# Patient Record
Sex: Female | Born: 1935 | Race: White | Hispanic: No | Smoking: Never smoker
Health system: Southern US, Community
[De-identification: ages and names within clinical notes are randomized; demographics above are authoritative.]

## PROBLEM LIST (undated history)

## (undated) DIAGNOSIS — I059 Rheumatic mitral valve disease, unspecified: Secondary | ICD-10-CM

## (undated) DIAGNOSIS — I10 Essential (primary) hypertension: Secondary | ICD-10-CM

## (undated) DIAGNOSIS — M8580 Other specified disorders of bone density and structure, unspecified site: Secondary | ICD-10-CM

## (undated) DIAGNOSIS — Z8489 Family history of other specified conditions: Secondary | ICD-10-CM

## (undated) DIAGNOSIS — T7840XA Allergy, unspecified, initial encounter: Secondary | ICD-10-CM

## (undated) DIAGNOSIS — C4490 Unspecified malignant neoplasm of skin, unspecified: Secondary | ICD-10-CM

## (undated) DIAGNOSIS — M7070 Other bursitis of hip, unspecified hip: Secondary | ICD-10-CM

## (undated) DIAGNOSIS — E785 Hyperlipidemia, unspecified: Secondary | ICD-10-CM

## (undated) HISTORY — DX: Other specified disorders of bone density and structure, unspecified site: M85.80

## (undated) HISTORY — DX: Allergy, unspecified, initial encounter: T78.40XA

## (undated) HISTORY — DX: Rheumatic mitral valve disease, unspecified: I05.9

## (undated) HISTORY — PX: DENTAL SURGERY: SHX609

## (undated) HISTORY — DX: Hyperlipidemia, unspecified: E78.5

## (undated) HISTORY — DX: Other bursitis of hip, unspecified hip: M70.70

## (undated) HISTORY — PX: TONSILLECTOMY: SUR1361

## (undated) HISTORY — DX: Essential (primary) hypertension: I10

## (undated) HISTORY — DX: Unspecified malignant neoplasm of skin, unspecified: C44.90

---

## 1969-06-23 HISTORY — PX: ABDOMINAL HYSTERECTOMY: SHX81

## 1997-11-21 HISTORY — PX: BREAST BIOPSY: SHX20

## 1997-11-23 ENCOUNTER — Ambulatory Visit (HOSPITAL_BASED_OUTPATIENT_CLINIC_OR_DEPARTMENT_OTHER): Admission: RE | Admit: 1997-11-23 | Discharge: 1997-11-23 | Payer: Self-pay | Admitting: General Surgery

## 1998-04-23 HISTORY — PX: ORIF RADIAL FRACTURE: SHX5113

## 2000-01-22 DIAGNOSIS — M8580 Other specified disorders of bone density and structure, unspecified site: Secondary | ICD-10-CM

## 2000-01-22 HISTORY — DX: Other specified disorders of bone density and structure, unspecified site: M85.80

## 2002-01-03 ENCOUNTER — Other Ambulatory Visit: Admission: RE | Admit: 2002-01-03 | Discharge: 2002-01-03 | Payer: Self-pay | Admitting: Family Medicine

## 2002-01-03 ENCOUNTER — Encounter: Payer: Self-pay | Admitting: Family Medicine

## 2002-01-03 LAB — CONVERTED CEMR LAB: Pap Smear: NORMAL

## 2002-01-14 LAB — FECAL OCCULT BLOOD, GUAIAC: Fecal Occult Blood: NEGATIVE

## 2003-11-29 ENCOUNTER — Encounter: Admission: RE | Admit: 2003-11-29 | Discharge: 2003-11-29 | Payer: Self-pay | Admitting: Family Medicine

## 2004-10-29 ENCOUNTER — Ambulatory Visit: Payer: Self-pay | Admitting: Family Medicine

## 2004-12-02 ENCOUNTER — Ambulatory Visit: Payer: Self-pay | Admitting: Family Medicine

## 2005-06-19 ENCOUNTER — Ambulatory Visit: Payer: Self-pay | Admitting: Family Medicine

## 2005-07-15 ENCOUNTER — Ambulatory Visit: Payer: Self-pay | Admitting: Family Medicine

## 2005-10-21 ENCOUNTER — Ambulatory Visit: Payer: Self-pay | Admitting: Family Medicine

## 2006-06-29 ENCOUNTER — Ambulatory Visit: Payer: Self-pay | Admitting: Family Medicine

## 2006-08-28 ENCOUNTER — Ambulatory Visit: Payer: Self-pay | Admitting: Family Medicine

## 2006-09-24 ENCOUNTER — Ambulatory Visit: Payer: Self-pay | Admitting: Family Medicine

## 2006-10-26 ENCOUNTER — Ambulatory Visit: Payer: Self-pay | Admitting: Family Medicine

## 2006-10-26 DIAGNOSIS — E7800 Pure hypercholesterolemia, unspecified: Secondary | ICD-10-CM

## 2006-10-26 DIAGNOSIS — I10 Essential (primary) hypertension: Secondary | ICD-10-CM | POA: Insufficient documentation

## 2006-10-26 HISTORY — DX: Essential (primary) hypertension: I10

## 2006-11-02 LAB — CONVERTED CEMR LAB
Albumin: 3.5 g/dL (ref 3.5–5.2)
BUN: 7 mg/dL (ref 6–23)
Chloride: 101 meq/L (ref 96–112)
Cholesterol: 220 mg/dL (ref 0–200)
Creatinine, Ser: 0.7 mg/dL (ref 0.4–1.2)
GFR calc Af Amer: 104 mL/min
HCT: 40.9 % (ref 36.0–46.0)
HDL: 49.5 mg/dL (ref >39.0)
Hemoglobin: 14.1 g/dL (ref 12.0–15.0)
Platelets: 197 10*3/uL (ref 150–400)
RBC: 4.37 M/uL (ref 3.87–5.11)
RDW: 12.9 % (ref 11.5–14.6)
Sodium: 139 meq/L (ref 135–145)
Triglycerides: 103 mg/dL (ref 0–149)

## 2006-11-30 ENCOUNTER — Ambulatory Visit: Payer: Self-pay | Admitting: Internal Medicine

## 2007-02-25 ENCOUNTER — Encounter: Payer: Self-pay | Admitting: Family Medicine

## 2007-02-25 DIAGNOSIS — I059 Rheumatic mitral valve disease, unspecified: Secondary | ICD-10-CM | POA: Insufficient documentation

## 2007-02-25 DIAGNOSIS — J309 Allergic rhinitis, unspecified: Secondary | ICD-10-CM | POA: Insufficient documentation

## 2007-02-25 DIAGNOSIS — I8310 Varicose veins of unspecified lower extremity with inflammation: Secondary | ICD-10-CM | POA: Insufficient documentation

## 2007-02-25 DIAGNOSIS — J45909 Unspecified asthma, uncomplicated: Secondary | ICD-10-CM | POA: Insufficient documentation

## 2007-02-25 DIAGNOSIS — M810 Age-related osteoporosis without current pathological fracture: Secondary | ICD-10-CM

## 2007-02-25 DIAGNOSIS — M5137 Other intervertebral disc degeneration, lumbosacral region: Secondary | ICD-10-CM

## 2007-02-25 DIAGNOSIS — C44300 Unspecified malignant neoplasm of skin of unspecified part of face: Secondary | ICD-10-CM | POA: Insufficient documentation

## 2007-02-25 DIAGNOSIS — E785 Hyperlipidemia, unspecified: Secondary | ICD-10-CM | POA: Insufficient documentation

## 2007-02-25 DIAGNOSIS — R002 Palpitations: Secondary | ICD-10-CM

## 2007-02-25 DIAGNOSIS — Z85820 Personal history of malignant melanoma of skin: Secondary | ICD-10-CM

## 2007-02-25 DIAGNOSIS — C44309 Unspecified malignant neoplasm of skin of other parts of face: Secondary | ICD-10-CM | POA: Insufficient documentation

## 2007-02-25 HISTORY — DX: Rheumatic mitral valve disease, unspecified: I05.9

## 2007-02-26 ENCOUNTER — Ambulatory Visit: Payer: Self-pay | Admitting: Family Medicine

## 2007-03-25 ENCOUNTER — Ambulatory Visit: Payer: Self-pay | Admitting: Family Medicine

## 2007-03-25 LAB — CONVERTED CEMR LAB
Bilirubin Urine: NEGATIVE
Nitrite: NEGATIVE
Protein, U semiquant: NEGATIVE
pH: 6.5

## 2007-09-08 ENCOUNTER — Ambulatory Visit: Payer: Self-pay | Admitting: Family Medicine

## 2007-09-23 ENCOUNTER — Ambulatory Visit: Payer: Self-pay | Admitting: Family Medicine

## 2007-09-23 DIAGNOSIS — H9190 Unspecified hearing loss, unspecified ear: Secondary | ICD-10-CM | POA: Insufficient documentation

## 2007-11-16 ENCOUNTER — Ambulatory Visit: Payer: Self-pay | Admitting: Internal Medicine

## 2007-11-16 LAB — CONVERTED CEMR LAB
Glucose, Urine, Semiquant: NEGATIVE
Ketones, urine, test strip: NEGATIVE
Nitrite: NEGATIVE
pH: 7

## 2007-12-06 ENCOUNTER — Encounter: Payer: Self-pay | Admitting: Family Medicine

## 2007-12-10 ENCOUNTER — Encounter (INDEPENDENT_AMBULATORY_CARE_PROVIDER_SITE_OTHER): Payer: Self-pay | Admitting: *Deleted

## 2008-04-20 ENCOUNTER — Encounter: Payer: Self-pay | Admitting: Family Medicine

## 2008-10-03 ENCOUNTER — Ambulatory Visit: Payer: Self-pay | Admitting: Family Medicine

## 2008-10-09 ENCOUNTER — Encounter (INDEPENDENT_AMBULATORY_CARE_PROVIDER_SITE_OTHER): Payer: Self-pay | Admitting: Internal Medicine

## 2008-10-09 ENCOUNTER — Ambulatory Visit: Payer: Self-pay | Admitting: Family Medicine

## 2008-10-16 LAB — CONVERTED CEMR LAB
BUN: 11 mg/dL (ref 6–23)
CO2: 32 meq/L (ref 19–32)
Calcium: 8.9 mg/dL (ref 8.4–10.5)
Chloride: 105 meq/L (ref 96–112)
Creatinine, Ser: 0.6 mg/dL (ref 0.4–1.2)
GFR calc non Af Amer: 102.25 mL/min (ref >60)
Glucose, Bld: 101 mg/dL — ABNORMAL HIGH (ref 70–99)
Potassium: 4.2 meq/L (ref 3.5–5.1)
Sodium: 143 meq/L (ref 135–145)
TSH: 0.85 microintl units/mL (ref 0.35–5.50)
Vit D, 25-Hydroxy: 61 ng/mL (ref 30–89)

## 2008-11-10 ENCOUNTER — Ambulatory Visit: Payer: Self-pay | Admitting: Family Medicine

## 2008-11-10 LAB — CONVERTED CEMR LAB: Cholesterol, target level: 200 mg/dL

## 2008-11-13 LAB — CONVERTED CEMR LAB
ALT: 19 units/L (ref 0–35)
Total CHOL/HDL Ratio: 4
VLDL: 36 mg/dL (ref 0.0–40.0)

## 2008-11-28 ENCOUNTER — Ambulatory Visit: Payer: Self-pay | Admitting: Family Medicine

## 2008-11-28 LAB — CONVERTED CEMR LAB
OCCULT 1: NEGATIVE
OCCULT 2: NEGATIVE
OCCULT 3: NEGATIVE

## 2008-11-29 ENCOUNTER — Encounter (INDEPENDENT_AMBULATORY_CARE_PROVIDER_SITE_OTHER): Payer: Self-pay | Admitting: *Deleted

## 2008-11-30 ENCOUNTER — Ambulatory Visit: Payer: Self-pay | Admitting: Family Medicine

## 2008-12-06 ENCOUNTER — Encounter: Payer: Self-pay | Admitting: Family Medicine

## 2008-12-11 ENCOUNTER — Encounter: Payer: Self-pay | Admitting: Family Medicine

## 2009-01-08 ENCOUNTER — Encounter: Payer: Self-pay | Admitting: Family Medicine

## 2009-01-08 ENCOUNTER — Ambulatory Visit: Payer: Self-pay | Admitting: Internal Medicine

## 2009-01-30 ENCOUNTER — Encounter (INDEPENDENT_AMBULATORY_CARE_PROVIDER_SITE_OTHER): Payer: Self-pay | Admitting: *Deleted

## 2009-02-16 ENCOUNTER — Ambulatory Visit: Payer: Self-pay | Admitting: Family Medicine

## 2009-02-16 LAB — CONVERTED CEMR LAB
Bilirubin Urine: NEGATIVE
Glucose, Urine, Semiquant: NEGATIVE
Ketones, urine, test strip: NEGATIVE
Nitrite: NEGATIVE
Protein, U semiquant: NEGATIVE
Urobilinogen, UA: 0.2
pH: 5.5

## 2009-02-17 ENCOUNTER — Encounter: Payer: Self-pay | Admitting: Family Medicine

## 2009-02-19 ENCOUNTER — Ambulatory Visit: Payer: Self-pay | Admitting: Family Medicine

## 2009-02-20 LAB — CONVERTED CEMR LAB
AST: 21 units/L (ref 0–37)
Direct LDL: 159.5 mg/dL

## 2009-03-01 ENCOUNTER — Ambulatory Visit: Payer: Self-pay | Admitting: Family Medicine

## 2009-04-12 ENCOUNTER — Ambulatory Visit: Payer: Self-pay | Admitting: Family Medicine

## 2009-04-16 LAB — CONVERTED CEMR LAB
AST: 23 units/L (ref 0–37)
Cholesterol: 173 mg/dL (ref 0–200)
LDL Cholesterol: 99 mg/dL (ref 0–99)

## 2009-06-19 ENCOUNTER — Ambulatory Visit: Payer: Self-pay | Admitting: Family Medicine

## 2009-06-19 LAB — CONVERTED CEMR LAB
Bacteria, UA: 0
Bilirubin Urine: NEGATIVE
Glucose, Urine, Semiquant: NEGATIVE
Ketones, urine, test strip: NEGATIVE
Nitrite: NEGATIVE
Urobilinogen, UA: 0.2

## 2009-06-23 HISTORY — PX: EYE SURGERY: SHX253

## 2009-10-11 ENCOUNTER — Ambulatory Visit: Payer: Self-pay | Admitting: Family Medicine

## 2009-10-16 ENCOUNTER — Ambulatory Visit: Payer: Self-pay | Admitting: Family Medicine

## 2009-10-17 LAB — CONVERTED CEMR LAB
AST: 21 units/L (ref 0–37)
Cholesterol: 153 mg/dL (ref 0–200)
HDL: 44.3 mg/dL (ref >39.00)
Total CHOL/HDL Ratio: 3

## 2009-10-23 ENCOUNTER — Encounter: Payer: Self-pay | Admitting: Family Medicine

## 2009-10-24 ENCOUNTER — Encounter (INDEPENDENT_AMBULATORY_CARE_PROVIDER_SITE_OTHER): Payer: Self-pay | Admitting: *Deleted

## 2010-05-08 ENCOUNTER — Ambulatory Visit: Payer: Self-pay | Admitting: Family Medicine

## 2010-05-09 LAB — CONVERTED CEMR LAB: Vit D, 25-Hydroxy: 69 ng/mL (ref 30–89)

## 2010-05-13 LAB — CONVERTED CEMR LAB
ALT: 20 units/L (ref 0–35)
AST: 25 units/L (ref 0–37)
Albumin: 3.8 g/dL (ref 3.5–5.2)
Glucose, Bld: 86 mg/dL (ref 70–99)
HCT: 39.9 % (ref 36.0–46.0)
HDL: 58.2 mg/dL (ref >39.00)
Hemoglobin: 13.6 g/dL (ref 12.0–15.0)
LDL Cholesterol: 100 mg/dL — ABNORMAL HIGH (ref 0–99)
Lymphocytes Relative: 20.2 % (ref 12.0–46.0)
MCHC: 34.2 g/dL (ref 30.0–36.0)
Monocytes Absolute: 0.5 10*3/uL (ref 0.1–1.0)
Monocytes Relative: 7.4 % (ref 3.0–12.0)
Potassium: 4.4 meq/L (ref 3.5–5.1)
RBC: 4.13 M/uL (ref 3.87–5.11)
RDW: 13.3 % (ref 11.5–14.6)
Sodium: 138 meq/L (ref 135–145)
Total Protein: 6.3 g/dL (ref 6.0–8.3)
WBC: 6.7 10*3/uL (ref 4.5–10.5)

## 2010-07-18 ENCOUNTER — Other Ambulatory Visit: Payer: Self-pay | Admitting: Dermatology

## 2010-07-25 NOTE — Miscellaneous (Signed)
  Clinical Lists Changes  Observations: Added new observation of MAMMO DUE: 10/24/2010 (10/23/2009 15:38) Added new observation of MAMMOGRAM: Normal (10/23/2009 15:38)

## 2010-07-25 NOTE — Assessment & Plan Note (Signed)
Summary: 6 MTH FU/CLE   Vital Signs:  Patient profile:   77 year old female Height:      64.25 inches Weight:      125.04 pounds BMI:     21.37 Temp:     98.1 degrees F oral Pulse rate:   76 / minute Pulse rhythm:   regular BP sitting:   120 / 80  (left arm) Cuff size:   regular  Vitals Entered By: Benny Lennert CMA Duncan Dull) (May 08, 2010 9:20 AM)  History of Present Illness: Chief complaint 6 month follow up for lipids and HTN and OP   has general aches and pains-- they do not get her down  she keeps moving , however   feeling good otherwise   did have some episodes of vertigo in the spring -- has to be still for several days and she is better   wt is up 2 lb with bmi of 21  120/80-- Htn in very good control  lipids very good in april with statin and diet    OP-- on actonel -- no problems with that  takes her ca and vit D dexa 7/10 lat vit D was 61  declines flu shots or pneumonia vaccine  is afraid of the flu   Contraindications/Deferment of Procedures/Staging:    Test/Procedure: FLU VAX    Reason for deferment: patient declined     Test/Procedure: Pneumovax vaccine    Reason for deferment: patient declined   Allergies: 1)  ! Penicillin 2)  ! Codeine 3)  ! Septra 4)  Penicillin 5)  Codeine 6)  Septra 7)  * Goody Powder 8)  * Evista 9)  Vioxx 10)  Atenolol 11)  * Hctz  Past History:  Past Surgical History: Last updated: 01/29/2009 Hysterectomy- partial, fibroids Fibrocystic breast biopsy (11/1997) Arm fracture- radial, no surgery (04/1998) Dexa- osteopenia (01/2000) Dexa- osteoporosis (02/2002),   stable (03/2004),  osteopenia, increased T score (11/2006) dexa slt imp osteopenia 7/10  Family History: Last updated: 02/25/2007 Father: CVA Mother: MI, DM Siblings: 1 sister, OA on foot  Social History: Last updated: 02/16/2009 Marital Status: Married Children: 4 Occupation: retired denies smoking Alcohol use-no Drug  use-no  Risk Factors: Smoking Status: never (02/25/2007)  Past Medical History: Allergic rhinitis Asthma Hyperlipidemia Osteoporosis high blood pressure bursitis hip    ortho - Dr Noel Gerold  derm- [facility]  Review of Systems General:  Denies fatigue, loss of appetite, and malaise. Eyes:  Denies blurring and eye irritation. CV:  Denies chest pain or discomfort, lightheadness, and palpitations. Resp:  Denies cough, shortness of breath, and wheezing. GI:  Denies abdominal pain and nausea. GU:  Denies urinary frequency. MS:  Denies joint pain, muscle aches, and cramps. Derm:  Denies itching, lesion(s), poor wound healing, and rash. Neuro:  Denies numbness and tingling. Psych:  Denies anxiety and depression. Endo:  Denies excessive thirst. Heme:  Denies abnormal bruising and bleeding.  Physical Exam  General:  slim elderly female Head:  normocephalic, atraumatic, and no abnormalities observed.   Eyes:  vision grossly intact, pupils equal, pupils round, and pupils reactive to light.  no conjunctival pallor, injection or icterus  Ears:  R ear normal and L ear normal.   Mouth:  pharynx pink and moist.   Neck:  supple with full rom and no masses or thyromegally, no JVD or carotid bruit  Lungs:  Normal respiratory effort, chest expands symmetrically. Lungs are clear to auscultation, no crackles or wheezes. Heart:  Normal rate and  regular rhythm. S1 and S2 normal without gallop, murmur, click, rub or other extra sounds. Abdomen:  soft and nt no renal bruits  Msk:  No deformity or scoliosis noted of thoracic or lumbar spine.  some OA changes poor rom hips Pulses:  R and L carotid,radial,femoral,dorsalis pedis and posterior tibial pulses are full and equal bilaterally Extremities:  No clubbing, cyanosis, edema, or deformity noted with normal full range of motion of all joints.   Neurologic:  sensation intact to light touch, gait normal, and DTRs symmetrical and normal.   Skin:  Intact  without suspicious lesions or rashes Cervical Nodes:  No lymphadenopathy noted Inguinal Nodes:  No significant adenopathy Psych:  normal affect, talkative and pleasant    Impression & Recommendations:  Problem # 1:  HYPERLIPIDEMIA (ICD-272.4) Assessment Unchanged  lab today has been well controlled on diet and statin  rev low sat fat diet  Her updated medication list for this problem includes:    Zocor 20 Mg Tabs (Simvastatin) .[name]... 1 by mouth once daily  Orders: Venipuncture ([address]) TLB-Lipid Panel (80061-LIPID) TLB-Renal Function Panel (80069-RENAL) TLB-CBC Platelet - w/Differential (85025-CBCD) TLB-Hepatic/Liver Function Pnl (80076-HEPATIC) TLB-TSH (Thyroid Stimulating Hormone) (84443-TSH) T-Vitamin D (25-Hydroxy) ([address]454-09811)  Labs Reviewed: SGOT: 21 (10/16/2009)   SGPT: 24 (10/16/2009)  Lipid Goals: Chol Goal: 200 (11/10/2008)   HDL Goal: 40 (11/10/2008)   LDL Goal: 130 (11/10/2008)   TG Goal: 150 (11/10/2008)  Prior 10 Yr Risk Heart Disease: Not enough information (11/10/2008)   HDL:44.30 (10/16/2009), 56.20 (04/12/2009)  LDL:88 (10/16/2009), 99 (04/12/2009)  Chol:153 (10/16/2009), 173 (04/12/2009)  Trig:105.0 (10/16/2009), 90.0 (04/12/2009)  Problem # 2:  OSTEOPOROSIS (ICD-733.00) Assessment: Unchanged  check D level  no problems with actonel rev ca and /d  not due for dexa untl summer Her updated medication list for this problem includes:    Actonel 35 Mg Tabs (Risedronate sodium) .[name]... Take 1 tablet by mouth once a week  Orders: Specimen Handling (91478)  Problem # 3:  BASAL CELL CARCINOMA, NOSE (ICD-173.3) Assessment: Comment Only hx of basal cell  needs f/u with derm for skin screen will make her own appt  Problem # 4:  HYPERTENSION, BENIGN (ICD-401.1) Assessment: Unchanged  good control with atenolol labs today refilled med f/u 6 mo Her updated medication list for this problem includes:    Atenolol 50 Mg Tabs (Atenolol) .[name]... Take 1/2 tablet  by mouth twice a day  Orders: Venipuncture ([address]) TLB-Lipid Panel (80061-LIPID) TLB-Renal Function Panel (80069-RENAL) TLB-CBC Platelet - w/Differential (85025-CBCD) TLB-Hepatic/Liver Function Pnl (80076-HEPATIC) TLB-TSH (Thyroid Stimulating Hormone) (84443-TSH) T-Vitamin D (25-Hydroxy) (13086-57846) Prescription Created Electronically [phone])  BP today: 120/80 Prior BP: 133/77 (10/11/2009)  Prior 10 Yr Risk Heart Disease: Not enough information (11/10/2008)  Labs Reviewed: K+: 4.2 (10/09/2008) Creat: : 0.6 (10/09/2008)   Chol: 153 (10/16/2009)   HDL: 44.30 (10/16/2009)   LDL: 88 (10/16/2009)   TG: 105.0 (10/16/2009)  Problem # 5:  ALLERGIC RHINITIS (ICD-477.9) Assessment: Unchanged has been controlled with claritin- no change Her updated medication list for this problem includes:    Claritin 10 Mg Tabs (Loratadine) .[name]... Take 1 tab daily for allergies.  Complete Medication List: 1)  Atenolol 50 Mg Tabs (Atenolol) .... Take 1/2 tablet by mouth twice a day 2)  Actonel 35 Mg Tabs (Risedronate sodium) .... Take 1 tablet by mouth once a week 3)  Omega-3 350 Mg Caps (Omega-3 fatty acids) .... Take 1 tablet by mouth once a day 4)  Bl Maximum One Daily Tabs (Multiple vitamins-minerals) .Marland KitchenMarland KitchenMarland Kitchen  Take 1 tablet by mouth once a day 5)  Lotrisone 1-0.05 % Lotn (Clotrimazole-betamethasone) .... Apply thinnly two times a day as needed irritation 6)  D 400 Caps (Vitamins a & d) .... Take 1 tab daily. 7)  Claritin 10 Mg Tabs (Loratadine) .... Take 1 tab daily for allergies. 8)  Triamcinolone Acetonide 0.1 % Crea (Triamcinolone acetonide) .... Apply to itchy rash two times a day, disp 2 week supply 9)  Aleve 220 Mg Tabs (Naproxen sodium) .[name].. 1-2 by mouth as needed bursitis pain 10)  Zocor 20 Mg Tabs (Simvastatin) .[name].. 1 by mouth once daily  Patient Instructions: 1)  keep as active as possible 2)  elevate feet when you do sit  3)  no change in medicine  4)  labs today  5)  follow up with me  in 6 months  Prescriptions: ATENOLOL 50 MG TABS (ATENOLOL) Take 1/2 tablet by mouth twice a day  #30 x 11   Entered and Authorized by:   Judith Part MD   Signed by:   Judith Part MD on 05/08/2010   Method used:   Electronically to        [facility] (retail)       [address]       [place], [place]  [address]       Ph: [phone]       Fax: [phone]   RxID:   [id number]    Orders Added: 1)  Venipuncture [84132] 2)  TLB-Lipid Panel [80061-LIPID] 3)  TLB-Renal Function Panel [80069-RENAL] 4)  TLB-CBC Platelet - w/Differential [85025-CBCD] 5)  TLB-Hepatic/Liver Function Pnl [80076-HEPATIC] 6)  TLB-TSH (Thyroid Stimulating Hormone) [84443-TSH] 7)  T-Vitamin D (25-Hydroxy) [44010-27253] 8)  Specimen Handling [99000] 9)  Est. Patient Level IV [66440] 10)  Prescription Created Electronically [H4742]    Current Allergies (reviewed today): ! PENICILLIN ! CODEINE ! SEPTRA PENICILLIN CODEINE SEPTRA * GOODY POWDER * EVISTA VIOXX ATENOLOL * HCTZ

## 2010-07-25 NOTE — Assessment & Plan Note (Signed)
Summary: Acute sinusitis   Vital Signs:  Patient Profile:   77 Years Old Female CC:      Cold & URI symptoms Height:     64.25 inches Weight:      123 pounds O2 Sat:      100 % O2 treatment:    Room Air Temp:     97.5 degrees F oral Pulse rate:   77 / minute Pulse rhythm:   regular Resp:     16 per minute BP sitting:   133 / 77  (right arm) Cuff size:   regular  Vitals Entered By: Areta Haber CMA (October 11, 2009 5:25 PM)                  Current Allergies: ! PENICILLIN ! CODEINE ! SEPTRA PENICILLIN CODEINE SEPTRA * GOODY POWDER * EVISTA VIOXX ATENOLOL * HCTZ    History of Present Illness Referral source: Dr. towers.  History from: patient Chief Complaint: Cold & URI symptoms History of Present Illness: 2 weeks  of ST and hoarseness.  Then started with a cough adn now getting sore over her nasal bridge.  Lots of head rpessure and HA. Feels like her head is gong to explode.  Not sure if any fever.  Feels her balance is of alittle.  Took some aleve for the HA.  Did feel achey all over.  Feel tired.  Decreased appetite.  No GI sxs.  No SOB.  Hx of high blood pressure.  Not  a smoker.    Current Problems: SINUSITIS, ACUTE (ICD-461.9) BURSITIS, RIGHT HIP (ICD-726.5) OTHER ABNORMAL FINDING RADIOLOGICAL EXAM BREAST (ICD-793.89) CELLULITIS (ICD-682.9) OTHER SCREENING MAMMOGRAM (ICD-V76.12) HEARING LOSS, BILATERAL (ICD-389.9) Hx of STASIS DERMATITIS (ICD-454.1) Hx of MELANOMA, LEG (ICD-172.7) Hx of MITRAL VALVE PROLAPSE (ICD-424.0) DEGENERATIVE DISC DISEASE, LUMBOSACRAL SPINE (ICD-722.52) BASAL CELL CARCINOMA, NOSE (ICD-173.3) PALPITATIONS (ICD-785.1) OSTEOPOROSIS (ICD-733.00) HYPERLIPIDEMIA (ICD-272.4) ASTHMA (ICD-493.90) ALLERGIC RHINITIS (ICD-477.9) HYPERTENSION, BENIGN (ICD-401.1) HYPERCHOLESTEROLEMIA (ICD-272.0)   Current Meds ATENOLOL 50 MG TABS (ATENOLOL) Take 1/2 tablet by mouth twice a day ACTONEL 35 MG TABS (RISEDRONATE SODIUM) Take 1 tablet  by mouth once a week OMEGA-3 350 MG  CAPS (OMEGA-3 FATTY ACIDS) Take 1 tablet by mouth once a day BL MAXIMUM ONE DAILY   TABS (MULTIPLE VITAMINS-MINERALS) Take 1 tablet by mouth once a day LOTRISONE 1-0.05 %  LOTN (CLOTRIMAZOLE-BETAMETHASONE) apply thinnly two times a day as needed irritation D 400   CAPS (VITAMINS A & D) Take 1 tab daily. CLARITIN 10 MG  TABS (LORATADINE) Take 1 tab daily for allergies. TRIAMCINOLONE ACETONIDE 0.1 % CREA (TRIAMCINOLONE ACETONIDE) Apply to itchy rash two times a day, disp 2 week supply ALEVE 220 MG TABS (NAPROXEN SODIUM) 1-2 by mouth as needed bursitis pain ZOCOR 20 MG TABS (SIMVASTATIN) 1 by mouth once daily DOXYCYCLINE HYCLATE 100 MG TABS (DOXYCYCLINE HYCLATE) Take 1 tablet by mouth two times a day for 10 days  REVIEW OF SYSTEMS Constitutional Symptoms      Denies fever, chills, night sweats, weight loss, weight gain, and fatigue.  Eyes       Denies change in vision, eye pain, eye discharge, glasses, contact lenses, and eye surgery. Ear/Nose/Throat/Mouth       Complains of sinus problems.      Denies hearing loss/aids, change in hearing, ear pain, ear discharge, dizziness, frequent runny nose, frequent nose bleeds, sore throat, hoarseness, and tooth pain or bleeding.      Comments: x 2wks Respiratory       Complains  of dry cough.      Denies productive cough, wheezing, shortness of breath, asthma, bronchitis, and emphysema/COPD.  Cardiovascular       Denies murmurs, chest pain, and tires easily with exhertion.    Gastrointestinal       Denies stomach pain, nausea/vomiting, diarrhea, constipation, blood in bowel movements, and indigestion. Genitourniary       Denies painful urination, kidney stones, and loss of urinary control. Neurological       Complains of headaches.      Denies paralysis, seizures, and fainting/blackouts. Musculoskeletal       Denies muscle pain, joint pain, joint stiffness, decreased range of motion, redness, swelling, muscle  weakness, and gout.  Skin       Denies bruising, unusual mles/lumps or sores, and hair/skin or nail changes.  Psych       Denies mood changes, temper/anger issues, anxiety/stress, speech problems, depression, and sleep problems. Other Comments: Pt has not seen PCP for this    Past History:  Past Medical History: Last updated: 03/01/2009 Allergic rhinitis Asthma Hyperlipidemia Osteoporosis high blood pressure bursitis hip    ortho - Dr Noel Gerold   Past Surgical History: Last updated: 01/29/2009 Hysterectomy- partial, fibroids Fibrocystic breast biopsy (11/1997) Arm fracture- radial, no surgery (04/1998) Dexa- osteopenia (01/2000) Dexa- osteoporosis (02/2002),   stable (03/2004),  osteopenia, increased T score (11/2006) dexa slt imp osteopenia 7/10  Family History: Last updated: 02/25/2007 Father: CVA Mother: MI, DM Siblings: 1 sister, OA on foot  Social History: Last updated: 02/16/2009 Marital Status: Married Children: 4 Occupation: retired denies smoking Alcohol use-no Drug use-no  Risk Factors: Smoking Status: never (02/25/2007) Physical Exam General appearance: well developed, well nourished, no acute distress Head: normocephalic, atraumatic Eyes: conjunctivae and lids normal, watering bilat Pupils: equal, round, reactive to light Ears: normal, no lesions or deformities Nasal: mucosa pink, nonedematous, no septal deviation, turbinates normal Oral/Pharynx: tongue normal, posterior pharynx without erythema or exudate Neck: neck supple,  trachea midline, no masses Thyroid: no nodules, masses, tenderness, or enlargement Chest/Lungs: no rales, wheezes, or rhonchi bilateral, breath sounds equal without effort Heart: RRR, with 2/6 SEM  Skin: no obvious rashes or lesions Assessment New Problems: SINUSITIS, ACUTE (ICD-461.9)  Discussee  Patient Education: Patient and/or caregiver instructed in the following: rest, fluids, Tylenol prn.  Plan New  Medications/Changes: DOXYCYCLINE HYCLATE 100 MG TABS (DOXYCYCLINE HYCLATE) Take 1 tablet by mouth two times a day for 10 days  #20 x 0, 10/11/2009, Nani Gasser MD DOXYCYCLINE HYCLATE 100 MG TABS (DOXYCYCLINE HYCLATE) Take 1 tablet by mouth two times a day for 10 days  #20 x 0, 10/11/2009, Nani Gasser MD  New Orders: Est. Patient Level III [[address]] Follow Up: Follow up in 2-3 days if no improvement  The patient and/or caregiver has been counseled thoroughly with regard to medications prescribed including dosage, schedule, interactions, rationale for use, and possible side effects and they verbalize understanding.  Diagnoses and expected course of recovery discussed and will return if not improved as expected or if the condition worsens. Patient and/or caregiver verbalized understanding.  Prescriptions: DOXYCYCLINE HYCLATE 100 MG TABS (DOXYCYCLINE HYCLATE) Take 1 tablet by mouth two times a day for 10 days  #20 x 0   Entered and Authorized by:   Nani Gasser MD   Signed by:   Nani Gasser MD on 10/11/2009   Method used:   Electronically to        [facility] [id number][facility] (retail)  [address]       [place], [place]  [address]       Ph: [phone]       Fax: [phone]   RxID:   [id number] DOXYCYCLINE HYCLATE 100 MG TABS (DOXYCYCLINE HYCLATE) Take 1 tablet by mouth two times a day for 10 days  #20 x 0   Entered and Authorized by:   Nani Gasser MD   Signed by:   Nani Gasser MD on 10/11/2009   Method used:   Electronically to        [facility] [id number][facility] (retail)       [address]       [place], [place]  [address]       Ph: [phone]       Fax: [phone]   RxID:   [id number]   Patient Instructions: 1)  [facility]  Make sure to take to all of it. 2)  Make sure to stay hydrated and use Tylenol for a headache.  3)  Followup with your regular MD if not better in 5 days.   Complete Medication  List: 1)  Atenolol 50 Mg Tabs (Atenolol) .... Take 1/2 tablet by mouth twice a day 2)  Actonel 35 Mg Tabs (Risedronate sodium) .... Take 1 tablet by mouth once a week 3)  Omega-3 350 Mg Caps (Omega-3 fatty acids) .... Take 1 tablet by mouth once a day 4)  Bl Maximum One Daily Tabs (Multiple vitamins-minerals) .... Take 1 tablet by mouth once a day 5)  Lotrisone 1-0.05 % Lotn (Clotrimazole-betamethasone) .... Apply thinnly two times a day as needed irritation 6)  D 400 Caps (Vitamins a & d) .... Take 1 tab daily. 7)  Claritin 10 Mg Tabs (Loratadine) .... Take 1 tab daily for allergies. 8)  Triamcinolone Acetonide 0.1 % Crea (Triamcinolone acetonide) .... Apply to itchy rash two times a day, disp 2 week supply 9)  Aleve 220 Mg Tabs (Naproxen sodium) .[name].. 1-2 by mouth as needed bursitis pain 10)  Zocor 20 Mg Tabs (Simvastatin) .[name].. 1 by mouth once daily 11)  Doxycycline Hyclate 100 Mg Tabs (Doxycycline hyclate) .... Take 1 tablet by mouth two times a day for 10 days

## 2010-07-25 NOTE — Letter (Signed)
Summary: Results Follow up Letter  [facility]  [address] [place], [place] [address]   Phone: [phone]  Fax: [phone]    10/24/2009 MRN: [id number]    [facility] [address] [place], [place]  [address]    Dear Ms. Procell,  The following are the results of your recent test(s):  Test         Result    Pap Smear:        Normal _____  Not Normal _____ Comments: ______________________________________________________ Cholesterol: LDL(Bad cholesterol):         Your goal is less than:         HDL (Good cholesterol):       Your goal is more than: Comments:  ______________________________________________________ Mammogram:        Normal ___X__  Not Normal _____ Comments:  Yearly follow up is recommended.   ___________________________________________________________________ Hemoccult:        Normal _____  Not normal _______ Comments:    _____________________________________________________________________ Other Tests:    We routinely do not discuss normal results over the telephone.  If you desire a copy of the results, or you have any questions about this information we can discuss them at your next office visit.   Sincerely,    Marne A. Milinda Antis, M.D.  MAT:lsf

## 2010-08-23 ENCOUNTER — Ambulatory Visit (INDEPENDENT_AMBULATORY_CARE_PROVIDER_SITE_OTHER): Payer: Medicare HMO | Admitting: Family Medicine

## 2010-08-23 ENCOUNTER — Encounter: Payer: Self-pay | Admitting: Family Medicine

## 2010-08-23 DIAGNOSIS — N390 Urinary tract infection, site not specified: Secondary | ICD-10-CM | POA: Insufficient documentation

## 2010-08-27 ENCOUNTER — Telehealth: Payer: Self-pay | Admitting: Family Medicine

## 2010-08-29 NOTE — Assessment & Plan Note (Signed)
Summary: ?UTI/CLE  SEEN @ [facility],[place] 08/10/10   Vital Signs:  Patient profile:   77 year old female Height:      64.25 inches Weight:      128.25 pounds BMI:     21.92 Temp:     98 degrees F oral Pulse rate:   84 / minute Pulse rhythm:   regular BP sitting:   150 / 70  (left arm) Cuff size:   regular  Vitals Entered By: Delilah Shan CMA Ayisha Pol Dull) (August 23, 2010 11:49 AM) CC: ? UTI   History of Present Illness: Prev with blood in urine and some change in urine color.  Had some mild burning with urination at the time.   [facility]ent to [facility] 08/10/10.  Dx'd with UTI and started on macrobid.  She had h/o recurrent UTI.   She finised the antibiotics with 10days of tx. Symptoms were better until today.  Today with pressure during urination.  No FCNAV.    We reviewed her allergies in detail and she had GI upset with cipro but no true allergy to this medicine.  Allergies: 1)  ! Penicillin 2)  ! Codeine 3)  ! Septra 4)  Penicillin 5)  Codeine 6)  Septra 7)  * Goody Powder 8)  * Evista 9)  Vioxx 10)  Atenolol 11)  * Hctz  Social History: Marital Status: Widowed Children: 4 Occupation: retired denies smoking Alcohol use-no Drug use-no  Review of Systems       See HPI.  Otherwise negative.    Physical Exam  General:  GEN: nad, alert and oriented HEENT: mucous membranes moist NECK: supple CV: rrr.  PULM: ctab, no inc wob ABD: soft, +bs, suprapubic area mildly tender EXT: no edema SKIN: no acute rash BACK: no CVA pain    Impression & Recommendations:  Problem # 1:  UTI (ICD-599.0) Okay to take short course of cipro.  check cx and follow up as needed.  she agrees.  Nontoxic.   Her updated medication list for this problem includes:    Ciprofloxacin Hcl 250 Mg Tabs (Ciprofloxacin hcl) .[name]... 1 by mouth two times a day  Orders: Prescription Created Electronically [phone]) Specimen Handling ([address]) T-Culture, Urine ([id number])  Complete Medication List: 1)   Atenolol 50 Mg Tabs (Atenolol) .... Take 1/2 tablet by mouth twice a day 2)  Actonel 35 Mg Tabs (Risedronate sodium) .... Take 1 tablet by mouth once a week 3)  Omega-3 350 Mg Caps (Omega-3 fatty acids) .... Take 1 tablet by mouth once a day 4)  Bl Maximum One Daily Tabs (Multiple vitamins-minerals) .... Take 1 tablet by mouth once a day 5)  Lotrisone 1-0.05 % Lotn (Clotrimazole-betamethasone) .... Apply thinnly two times a day as needed irritation 6)  D 400 Caps (Vitamins a & d) .... Take 1 tab daily. 7)  Claritin 10 Mg Tabs (Loratadine) .... Take 1 tab daily for allergies. 8)  Triamcinolone Acetonide 0.1 % Crea (Triamcinolone acetonide) .... Apply to itchy rash two times a day, disp 2 week supply 9)  Aleve 220 Mg Tabs (Naproxen sodium) .[name].. 1-2 by mouth as needed bursitis pain 10)  Zocor 20 Mg Tabs (Simvastatin) .[name].. 1 by mouth once daily 11)  Ciprofloxacin Hcl 250 Mg Tabs (Ciprofloxacin hcl) .[name].. 1 by mouth two times a day  Patient Instructions: 1)  Drink plenty of fluids. Cranberry juice is especially recommended in addition to large amounts of water. Avoid caffeine & carbonated drinks, they tend to irritate the bladder,  Return in 3-5 days if you're not better: sooner if you're feeling worse.  2)  Start the cipro today.  We'll contact you with your lab report.  Prescriptions: CIPROFLOXACIN HCL 250 MG TABS (CIPROFLOXACIN HCL) 1 by mouth two times a day  #6 x 0   Entered and Authorized by:   Crawford Givens MD   Signed by:   Crawford Givens MD on 08/23/2010   Method used:   Electronically to        [facility]* (retail)       [address]       [place], [place]  [address]       Ph: [phone]       Fax: [phone]   RxID:   [id number]    Orders Added: 1)  Prescription Created Electronically [[id number]] 2)  Est. Patient Level III [[address]] 3)  Specimen Handling [99000] 4)  T-Culture, Urine [[id number]]    Current Allergies (reviewed today): ! PENICILLIN ! CODEINE !  SEPTRA PENICILLIN CODEINE SEPTRA * GOODY POWDER * EVISTA VIOXX ATENOLOL * HCTZ  Appended Document: ?UTI/CLE  SEEN @ [facility],[place] ON 08/10/10  Laboratory Results   Urine Tests  Date/Time Received: August 23, 2010 4:57 PM   Routine Urinalysis   Color: yellow Appearance: Cloudy Glucose: negative   (Normal Range: Negative) Bilirubin: negative   (Normal Range: Negative) Ketone: negative   (Normal Range: Negative) Spec. Gravity: 1.020   (Normal Range: 1.003-1.035) Blood: large   (Normal Range: Negative) pH: 6.0   (Normal Range: 5.0-8.0) Protein: trace   (Normal Range: Negative) Urobilinogen: 0.2   (Normal Range: 0-1) Nitrite: positive   (Normal Range: Negative) Leukocyte Esterace: moderate   (Normal Range: Negative)

## 2010-09-02 ENCOUNTER — Encounter: Payer: Self-pay | Admitting: Family Medicine

## 2010-09-02 ENCOUNTER — Ambulatory Visit (INDEPENDENT_AMBULATORY_CARE_PROVIDER_SITE_OTHER): Payer: Medicare HMO | Admitting: Family Medicine

## 2010-09-02 DIAGNOSIS — I10 Essential (primary) hypertension: Secondary | ICD-10-CM

## 2010-09-02 DIAGNOSIS — R4589 Other symptoms and signs involving emotional state: Secondary | ICD-10-CM

## 2010-09-02 DIAGNOSIS — N390 Urinary tract infection, site not specified: Secondary | ICD-10-CM

## 2010-09-02 LAB — CONVERTED CEMR LAB
Bilirubin Urine: NEGATIVE
Nitrite: NEGATIVE
Specific Gravity, Urine: 1.01
Urobilinogen, UA: 0.2
WBC Urine, dipstick: NEGATIVE

## 2010-09-03 NOTE — Progress Notes (Signed)
  Phone Note Call from Patient Call back at [phone]   Caller: Patient Call For: Dr.Gleen Ripberger Summary of Call: Please have Urine Culture results phoned to pt's daughter's home.  She's staying w/ her.  Phone [phone] Initial call taken by: Beau Fanny,  August 27, 2010 9:05 AM  Follow-up for Phone Call        see ucx report. Crawford Givens MD  August 27, 2010 11:07 AM   Done.  Lugene Fuquay CMA (AAMA)  August 27, 2010 11:20 AM

## 2010-09-10 NOTE — Assessment & Plan Note (Signed)
Summary: urgent care follow up for uti/ [facility]   Vital Signs:  Patient profile:   77 year old female Height:      64.25 inches Weight:      128.25 pounds BMI:     21.92 Temp:     98.1 degrees F oral Pulse rate:   76 / minute Pulse rhythm:   regular BP sitting:   140 / 78  (left arm) Cuff size:   regular  Vitals Entered By: Lewanda Rife LPN (September 02, 2010 10:39 AM) CC: f/u UTI seen at [facility]   History of Present Illness: here for f/u of recurrent uti was pretty miserable for a while   main symptom was heaviness over bladder and frequency  that is much better  was [facility] - and urine was red at that time- it frightened her    ua is normal today recently had ecoli uti pan sensitive and tx twice with macrobid and then cipro (which worked better) urine was really yellow from her vitamin this am   drinking lot of water   tougue does not want to stay still- this is annoying/ like a tremor  ? from stress- a lot happening with family and self     Allergies: 1)  ! Penicillin 2)  ! Codeine 3)  ! Septra 4)  Penicillin 5)  Codeine 6)  Septra 7)  * Goody Powder 8)  * Evista 9)  Vioxx 10)  Atenolol 11)  * Hctz  Past History:  Past Medical History: Last updated: 05/08/2010 Allergic rhinitis Asthma Hyperlipidemia Osteoporosis high blood pressure bursitis hip    ortho - Dr Noel Gerold  derm- [facility]  Past Surgical History: Last updated: 01/29/2009 Hysterectomy- partial, fibroids Fibrocystic breast biopsy (11/1997) Arm fracture- radial, no surgery (04/1998) Dexa- osteopenia (01/2000) Dexa- osteoporosis (02/2002),   stable (03/2004),  osteopenia, increased T score (11/2006) dexa slt imp osteopenia 7/10  Family History: Last updated: 09/02/2010 Father: CVA Mother: MI, DM Siblings: 1 sister, OA on foot sister leukemia   Social History: Last updated: 09/02/2010 Marital Status: Widowed Children: 4 Occupation: retired denies smoking Alcohol  use-no Drug use-no caring for daughter with leg injury caring for sister with leukemia   Risk Factors: Smoking Status: never (02/25/2007)  Family History: Father: CVA Mother: MI, DM Siblings: 1 sister, OA on foot sister leukemia   Social History: Marital Status: Widowed Children: 4 Occupation: retired denies smoking Alcohol use-no Drug use-no caring for daughter with leg injury caring for sister with leukemia   Review of Systems General:  Denies chills, fatigue, fever, and loss of appetite. Eyes:  Denies blurring. ENT:  Denies difficulty swallowing, nosebleeds, sinus pressure, and sore throat. CV:  Denies chest pain or discomfort and palpitations. Resp:  Denies cough, shortness of breath, and wheezing. GI:  Denies abdominal pain, bloody stools, change in bowel habits, indigestion, nausea, and vomiting. GU:  Denies dysuria, hematuria, urinary frequency, and urinary hesitancy. MS:  Denies mid back pain. Derm:  Denies itching, lesion(s), poor wound healing, and rash. Neuro:  Denies numbness and tingling. Psych:  Denies anxiety and depression. Endo:  Denies excessive thirst and excessive urination. Heme:  Denies abnormal bruising and bleeding.  Physical Exam  General:  Well-developed,well-nourished,in no acute distress; alert,appropriate and cooperative throughout examination Head:  normocephalic, atraumatic, and no abnormalities observed.   Eyes:  vision grossly intact, pupils equal, pupils round, and pupils reactive to light.  no conjunctival pallor, injection or icterus  Nose:  no nasal  discharge.   Mouth:  pharynx pink and moist.   Neck:  supple with full rom and no masses or thyromegally, no JVD or carotid bruit  Chest Wall:  No deformities, masses, or tenderness noted. Lungs:  Normal respiratory effort, chest expands symmetrically. Lungs are clear to auscultation, no crackles or wheezes. Heart:  Normal rate and regular rhythm. S1 and S2 normal without gallop,  murmur, click, rub or other extra sounds. Abdomen:  no suprapubic tenderness or fullness felt  Extremities:  No clubbing, cyanosis, edema, or deformity noted with normal full range of motion of all joints.   Neurologic:  sensation intact to light touch, gait normal, and DTRs symmetrical and normal.  no tremors noted - toungue or otherwise  Skin:  Intact without suspicious lesions or rashes Cervical Nodes:  No lymphadenopathy noted Psych:  normal affect, talkative and pleasant    Impression & Recommendations:  Problem # 1:  UTI (ICD-599.0) Assessment Improved this appears to have cleared with nl ua and reslution of symptoms  continue to watch keep up good fluids  The following medications were removed from the medication list:    Ciprofloxacin Hcl 250 Mg Tabs (Ciprofloxacin hcl) .[name]... 1 by mouth two times a day  Orders: UA Dipstick w/o Micro (manual) (04540)  Problem # 2:  HYPERTENSION, BENIGN (ICD-401.1) Assessment: Deteriorated  bp up a bit with anx and stress will continue to follow  f/u 6 mo  Her updated medication list for this problem includes:    Atenolol 50 Mg Tabs (Atenolol) .[name]... Take 1 tablet by mouth once a day at night  BP today: 140/78 Prior BP: 150/70 (08/23/2010)  Prior 10 Yr Risk Heart Disease: Not enough information (11/10/2008)  Labs Reviewed: K+: 4.4 (05/08/2010) Creat: : 0.7 (05/08/2010)   Chol: 183 (05/08/2010)   HDL: 58.20 (05/08/2010)   LDL: 100 (05/08/2010)   TG: 122.0 (05/08/2010)  Problem # 3:  STRESS REACTION, ACUTE, WITH EMOTIONAL DISTURBANCE (ICD-308.0) Assessment: New with much psychologial stress -- and exp a tounge tremor  no meds to expl this  suspect anx  pt has good coping tech will watch this  did offer counseling if she needs it   Complete Medication List: 1)  Atenolol 50 Mg Tabs (Atenolol) .... Take 1 tablet by mouth once a day at night 2)  Actonel 35 Mg Tabs (Risedronate sodium) .... Take 1 tablet by mouth once a week 3)   Omega-3 350 Mg Caps (Omega-3 fatty acids) .... Take 1 tablet by mouth once a day 4)  Bl Maximum One Daily Tabs (Multiple vitamins-minerals) .... Take 1 tablet by mouth once a day 5)  Lotrisone 1-0.05 % Lotn (Clotrimazole-betamethasone) .... Apply thinnly two times a day as needed irritation 6)  D 400 Caps (Vitamins a & d) .... Take 1 tab daily. 7)  Claritin 10 Mg Tabs (Loratadine) .... Take 1 tab daily for allergies. 8)  Triamcinolone Acetonide 0.1 % Crea (Triamcinolone acetonide) .... Apply to itchy rash two times a day, disp 2 week supply as needed 9)  Aleve 220 Mg Tabs (Naproxen sodium) .[name].. 1-2 by mouth as needed bursitis pain 10)  Zocor 20 Mg Tabs (Simvastatin) .[name].. 1 by mouth once daily  Patient Instructions: 1)  urine is clear today  2)  continue good water intake  3)  try to stay active - go for a walk when you can  4)  this will help anxiety 5)  let me know if your toungue tremor worsens or does not improve  6)  avoid aleve for 5 days prior from getting teeth pulled  7)  follow up with me in about 6 months    Orders Added: 1)  UA Dipstick w/o Micro (manual) [81002] 2)  Est. Patient Level IV [13086]    Current Allergies (reviewed today): ! PENICILLIN ! CODEINE ! SEPTRA PENICILLIN CODEINE SEPTRA * GOODY POWDER * EVISTA VIOXX ATENOLOL * HCTZ  Laboratory Results   Urine Tests  Date/Time Received: September 02, 2010 10:41 AM  Date/Time Reported: September 02, 2010 10:41 AM   Routine Urinalysis   Color: yellow Appearance: Clear Glucose: negative   (Normal Range: Negative) Bilirubin: negative   (Normal Range: Negative) Ketone: negative   (Normal Range: Negative) Spec. Gravity: 1.010   (Normal Range: 1.003-1.035) Blood: trace-lysed   (Normal Range: Negative) pH: 6.0   (Normal Range: 5.0-8.0) Protein: negative   (Normal Range: Negative) Urobilinogen: 0.2   (Normal Range: 0-1) Nitrite: negative   (Normal Range: Negative) Leukocyte Esterace: negative   (Normal Range:  Negative)

## 2010-09-30 ENCOUNTER — Other Ambulatory Visit: Payer: Self-pay | Admitting: Family Medicine

## 2010-10-14 ENCOUNTER — Other Ambulatory Visit: Payer: Self-pay | Admitting: *Deleted

## 2010-10-14 MED ORDER — RISEDRONATE SODIUM 35 MG PO TABS: 35.0000 mg | ORAL_TABLET | ORAL | Status: DC

## 2010-10-25 LAB — HM MAMMOGRAPHY: HM Mammogram: NEGATIVE

## 2010-11-25 ENCOUNTER — Encounter: Payer: Self-pay | Admitting: Family Medicine

## 2010-11-25 ENCOUNTER — Ambulatory Visit (INDEPENDENT_AMBULATORY_CARE_PROVIDER_SITE_OTHER): Payer: Medicare HMO | Admitting: Family Medicine

## 2010-11-25 VITALS — BP 112/74 | HR 72 | Wt 126.2 lb

## 2010-11-25 DIAGNOSIS — R319 Hematuria, unspecified: Secondary | ICD-10-CM

## 2010-11-25 DIAGNOSIS — N390 Urinary tract infection, site not specified: Secondary | ICD-10-CM

## 2010-11-25 LAB — POCT URINALYSIS DIPSTICK
Protein, UA: NEGATIVE
Urobilinogen, UA: 0.2
pH, UA: 6

## 2010-11-25 MED ORDER — CIPROFLOXACIN HCL 250 MG PO TABS: 250.0000 mg | ORAL_TABLET | Freq: Two times a day (BID) | ORAL | Status: AC

## 2010-11-25 NOTE — Progress Notes (Signed)
  Subjective:    Patient ID: Paula Jimenez, female    DOB: 07-08-1926, 76 y.o.   MRN: [id number]  Urinary Tract Infection  This is a new problem. The current episode started yesterday. The problem occurs every urination. The problem has been gradually worsening. The quality of the pain is described as burning. The pain is mild. There has been no fever. She is not sexually active. There is no history of pyelonephritis. Associated symptoms include frequency and hematuria. Pertinent negatives include no chills, nausea, possible pregnancy, urgency or vomiting. Associated symptoms comments: Has seen occ blood clots and pink urine yesterday, none at most recent urinaction Does takes goody headache powder for headache yesterday, no aspirin. She has tried nothing for the symptoms. Her past medical history is significant for recurrent UTIs. There is no history of kidney stones or a urological procedure.    Last treated for pansensitive Ecoli by Dr. Para March in 08/2010 with cipro. Symptoms resolved completely. 2 UTI, no including today since last December  Review of Systems  Constitutional: Negative for chills.  Gastrointestinal: Negative for nausea and vomiting.  Genitourinary: Positive for frequency and hematuria. Negative for urgency.       Objective:   Physical Exam        Assessment & Plan:

## 2010-11-25 NOTE — Assessment & Plan Note (Signed)
Treat with 7 days of ciprofloxacin given elderly pt with recurrent UTI. Will verify sensitivity with urine culture. I have recommended follow up to assure blood in urine resolved after treatment. i have also recommended referral to urologist if she has another UTI in next few months.

## 2010-11-25 NOTE — Assessment & Plan Note (Signed)
Return to assure resolution of hematuria on UA in 2 weeks.

## 2010-11-25 NOTE — Patient Instructions (Addendum)
Make follow up appt in 2 weeks for nurse visit for urinalysis to make sure blood in urine resolved. Start antibitoics. Push fluids to stay hydrated. We will call you with urine culture results. Hold aleve or aspirin containing products until returning for urine re-check.

## 2010-11-27 LAB — URINE CULTURE

## 2010-11-28 ENCOUNTER — Telehealth: Payer: Self-pay

## 2010-11-28 NOTE — Telephone Encounter (Signed)
Message copied by Patience Musca on Thu Nov 28, 2010  2:56 PM ------      Message from: Ermalene Searing, [place] E      Created: Wed Nov 27, 2010  4:42 PM       Notify pt that her urine culture shows Ecoli again and it is sensitive to the antibiotic she is on. AMke sure to keep appt for nurse visit in 2 weeks to assure blood in urine resolved.

## 2010-11-28 NOTE — Telephone Encounter (Signed)
Patient notified as instructed by telephone. 

## 2010-12-10 ENCOUNTER — Ambulatory Visit (INDEPENDENT_AMBULATORY_CARE_PROVIDER_SITE_OTHER): Payer: Medicare HMO | Admitting: Family Medicine

## 2010-12-10 DIAGNOSIS — R8290 Unspecified abnormal findings in urine: Secondary | ICD-10-CM

## 2010-12-10 DIAGNOSIS — R82998 Other abnormal findings in urine: Secondary | ICD-10-CM

## 2010-12-10 LAB — POCT URINALYSIS DIPSTICK
Bilirubin, UA: NEGATIVE
Glucose, UA: NEGATIVE
Ketones, UA: NEGATIVE
Protein, UA: NEGATIVE

## 2010-12-14 NOTE — Progress Notes (Signed)
  Subjective:    Patient ID: Paula Jimenez, female    DOB: Apr 23, 1927, 77 y.o.   MRN: [id number]  HPI Here for ua   Review of Systems     Objective:   Physical Exam        Assessment & Plan:

## 2011-01-08 ENCOUNTER — Ambulatory Visit (INDEPENDENT_AMBULATORY_CARE_PROVIDER_SITE_OTHER): Payer: Medicare HMO | Admitting: Family Medicine

## 2011-01-08 ENCOUNTER — Encounter: Payer: Self-pay | Admitting: Family Medicine

## 2011-01-08 DIAGNOSIS — R319 Hematuria, unspecified: Secondary | ICD-10-CM

## 2011-01-08 DIAGNOSIS — R300 Dysuria: Secondary | ICD-10-CM

## 2011-01-08 DIAGNOSIS — R310 Gross hematuria: Secondary | ICD-10-CM

## 2011-01-08 LAB — POCT URINALYSIS DIPSTICK
Bilirubin, UA: NEGATIVE
Blood, UA: NEGATIVE
Glucose, UA: NEGATIVE
Protein, UA: NEGATIVE
Spec Grav, UA: 1.005
Urobilinogen, UA: 0.2
pH, UA: 6

## 2011-01-08 NOTE — Progress Notes (Signed)
Paula Jimenez, a 77 y.o. female presents today in the office for the following:    Pleasant patient presents with gross hematuria, dysuria. Some twinge with termination of urination. Not uncomfortable now, but clot earlier in the week from urethra. No back or CVAT. No flank pain. No fever or chills  [phone] Daughter's number Paula Jimenez Call here during the day  The PMH, PSH, Social History, Family History, Medications, and allergies have been reviewed in [facility], and have been updated if relevant.  ROS: GEN: No acute illnesses, no fevers, chills. GI: No n/v/d, eating normally Pulm: No SOB Interactive and getting along well at home.  Otherwise, ROS is as per the HPI.   Physical Exam  Blood pressure 138/64, pulse 68, temperature 98.1 F (36.7 C), temperature source Oral, weight 125 lb (56.7 kg).  GEN: WDWN, NAD, Non-toxic, A & O x 3 HEENT: Atraumatic, Normocephalic. Neck supple. No masses, No LAD. Ears and Nose: No external deformity. CV: RRR, No M/G/R. No JVD. No thrill. No extra heart sounds. PULM: CTA B, no wheezes, crackles, rhonchi. No retractions. No resp. distress. No accessory muscle use. ABD: S, NT, ND, +BS. No rebound tenderness. No HSM.  EXTR: No c/c/e NEURO Normal gait.  PSYCH: Normally interactive. Conversant. Not depressed or anxious appearing.  Calm demeanor.   Assessment and Plan: 1.  Dysuria and hematuria: UA neg. Check cx. If +, ABX. If neg, will need further urological eval given hematuria.

## 2011-01-08 NOTE — Progress Notes (Signed)
Addended by: Alvina Chou on: 01/08/2011 02:39 PM   Modules accepted: Orders

## 2011-01-10 ENCOUNTER — Telehealth: Payer: Self-pay | Admitting: Family Medicine

## 2011-01-10 LAB — URINE CULTURE

## 2011-01-10 MED ORDER — CIPROFLOXACIN HCL 500 MG PO TABS: 500.0000 mg | ORAL_TABLET | Freq: Two times a day (BID) | ORAL | Status: AC

## 2011-01-10 NOTE — Telephone Encounter (Signed)
Notify pt urine culture positive for Ecoli.. Need to start antibiotics. Sensitivities not back yet.  Last urine culture was pansensitive. Will start Cipro BID x 7 days.

## 2011-01-10 NOTE — Telephone Encounter (Signed)
Left message for patient since Friday afternoon also advised patient to call me back with any questions she has

## 2011-04-01 ENCOUNTER — Encounter: Payer: Self-pay | Admitting: Family Medicine

## 2011-04-02 ENCOUNTER — Encounter: Payer: Self-pay | Admitting: Family Medicine

## 2011-04-02 ENCOUNTER — Ambulatory Visit (INDEPENDENT_AMBULATORY_CARE_PROVIDER_SITE_OTHER): Payer: Medicare HMO | Admitting: Family Medicine

## 2011-04-02 VITALS — BP 146/80 | HR 68 | Temp 98.0°F | Ht 64.25 in | Wt 124.2 lb

## 2011-04-02 DIAGNOSIS — I10 Essential (primary) hypertension: Secondary | ICD-10-CM

## 2011-04-02 DIAGNOSIS — E785 Hyperlipidemia, unspecified: Secondary | ICD-10-CM

## 2011-04-02 LAB — CBC WITH DIFFERENTIAL/PLATELET
Eosinophils Absolute: 0.1 10*3/uL (ref 0.0–0.7)
HCT: 40.7 % (ref 36.0–46.0)
Lymphs Abs: 1.4 10*3/uL (ref 0.7–4.0)
MCHC: 33.2 g/dL (ref 30.0–36.0)
MCV: 95.5 fl (ref 78.0–100.0)
Monocytes Absolute: 0.5 10*3/uL (ref 0.1–1.0)
Neutrophils Relative %: 68.4 % (ref 43.0–77.0)
Platelets: 194 10*3/uL (ref 150.0–400.0)

## 2011-04-02 LAB — LIPID PANEL
Cholesterol: 163 mg/dL (ref 0–200)
HDL: 59.4 mg/dL (ref >39.00)
LDL Cholesterol: 84 mg/dL (ref 0–99)
VLDL: 19.8 mg/dL (ref 0.0–40.0)

## 2011-04-02 LAB — COMPREHENSIVE METABOLIC PANEL
AST: 20 U/L (ref 0–37)
Albumin: 4 g/dL (ref 3.5–5.2)
Alkaline Phosphatase: 58 U/L (ref 39–117)
BUN: 10 mg/dL (ref 6–23)
Glucose, Bld: 97 mg/dL (ref 70–99)
Potassium: 4 mEq/L (ref 3.5–5.1)
Sodium: 139 mEq/L (ref 135–145)
Total Bilirubin: 0.4 mg/dL (ref 0.3–1.2)

## 2011-04-02 LAB — TSH: TSH: 0.75 u[IU]/mL (ref 0.35–5.50)

## 2011-04-02 MED ORDER — ATENOLOL 50 MG PO TABS: 50.0000 mg | ORAL_TABLET | Freq: Every day | ORAL | Status: DC

## 2011-04-02 MED ORDER — SIMVASTATIN 20 MG PO TABS: 20.0000 mg | ORAL_TABLET | Freq: Every day | ORAL | Status: DC

## 2011-04-02 MED ORDER — RISEDRONATE SODIUM 35 MG PO TABS: 35.0000 mg | ORAL_TABLET | ORAL | Status: DC

## 2011-04-02 NOTE — Patient Instructions (Signed)
Call if you change your mind about flu shot  Labs today No change in your medicine  Stay active Schedule 30 min annual exam in 6 months with lab prior

## 2011-04-02 NOTE — Progress Notes (Signed)
Subjective:    Patient ID: Paula Jimenez, female    DOB: 1927/05/12, 77 y.o.   MRN: [id number]  HPI Here for f/u of HTN and hyperlipidemia - also due for labs Is feeling well overall  Nothing new medically overall   Had her bottom teeth pulled in June with gum and bone surgery- relived to be done with that    Some stress over her daughter - family issues , health issues - fibromyalgia and RA    Was here over the summer for utis and hematuria Has not had another uti- is totally better now  Did not end up having to see a urologist for that   HTN  146/80 today No cp or palpitations , but has hx of chronic headaches (years ) -- but not migraine  Does well with her medicines   Wt is stable with bmi of 21  On zocor for lipids Lab Results  Component Value Date   CHOL 183 05/08/2010   HDL 58.20 05/08/2010   LDLCALC 100* 05/08/2010   LDLDIRECT 159.5 02/19/2009   TRIG 122.0 05/08/2010   CHOLHDL 3 05/08/2010   does watch her diet Stays away from sat fats/ fried foods/ red meat and is overall quite active    Flu shot - she declines - even with information - does not believe it will not make her sick Disc this for quite a while and I could not change her mind  Patient Active Problem List  Diagnoses  . MELANOMA, LEG  . BASAL CELL CARCINOMA, NOSE  . HYPERCHOLESTEROLEMIA  . HYPERLIPIDEMIA  . HEARING LOSS, BILATERAL  . HYPERTENSION, BENIGN  . MITRAL VALVE PROLAPSE  . STASIS DERMATITIS  . ALLERGIC RHINITIS  . ASTHMA  . DEGENERATIVE DISC DISEASE, LUMBOSACRAL SPINE  . OSTEOPOROSIS  . PALPITATIONS  . BASAL CELL CARCINOMA, NOSE  . UTI  . STRESS REACTION, ACUTE, WITH EMOTIONAL DISTURBANCE  . Hematuria   Past Medical History  Diagnosis Date  . Allergy   . Asthma   . Hyperlipidemia   . Osteoporosis   . Hypertension   . Bursitis, hip   . Osteopenia 08/01   Past Surgical History  Procedure Date  . Abdominal hysterectomy   . Breast biopsy 06/99    fibrocystic  . Orif  radial fracture 11/99    arm fracture, radial no surgery   History  Substance Use Topics  . Smoking status: Never Smoker   . Smokeless tobacco: Not on file  . Alcohol Use: No   Family History  Problem Relation Age of Onset  . Leukemia Sister   . Cancer Sister     leukemia  . Heart disease Mother   . Diabetes Mother   . Stroke Father    Allergies  Allergen Reactions  . Atenolol     REACTION: can't tolerate whole pill  . Codeine   . Penicillins     REACTION: mouth swelling  . Raloxifene     REACTION: rash  . Rofecoxib   . Sulfamethoxazole W/Trimethoprim     REACTION: unknown   Current Outpatient Prescriptions on File Prior to Visit  Medication Sig Dispense Refill  . fish oil-omega-3 fatty acids 1000 MG capsule Take 1 g by mouth daily.        [name] loratadine (CLARITIN) 10 MG tablet Take 10 mg by mouth daily.        . Multiple Vitamins-Minerals (MULTIVITAMIN WITH MINERALS) tablet Take 1 tablet by mouth daily.        [name]  naproxen sodium (ANAPROX) 220 MG tablet Take 220 mg by mouth 2 (two) times daily with a meal.             Review of Systems Review of Systems  Constitutional: Negative for fever, appetite change, fatigue and unexpected weight change.  Eyes: Negative for pain and visual disturbance.  Respiratory: Negative for cough and shortness of breath.   Cardiovascular: Negative for cp or palpitations    Gastrointestinal: Negative for nausea, diarrhea and constipation.  Genitourinary: Negative for urgency and frequency.  Skin: Negative for pallor or rash   Neurological: Negative for weakness, light-headedness, numbness and headaches.  Hematological: Negative for adenopathy. Does not bruise/bleed easily.  Psychiatric/Behavioral: Negative for dysphoric mood. The patient is not nervous/anxious.          Objective:   Physical Exam  Constitutional: She appears well-developed and well-nourished. No distress.  HENT:  Head: Normocephalic and atraumatic.  Mouth/Throat:  Oropharynx is clear and moist.  Eyes: Conjunctivae and EOM are normal. Pupils are equal, round, and reactive to light.  Neck: Normal range of motion. Neck supple. No JVD present. Carotid bruit is not present. No thyromegaly present.  Cardiovascular: Normal rate, regular rhythm, normal heart sounds and intact distal pulses.   Pulmonary/Chest: Breath sounds normal. No respiratory distress. She has no wheezes.  Abdominal: Soft. Bowel sounds are normal. She exhibits no distension, no abdominal bruit and no mass. There is no tenderness.  Musculoskeletal: Normal range of motion. She exhibits no edema and no tenderness.  Lymphadenopathy:    She has no cervical adenopathy.  Neurological: She is alert. She has normal reflexes. No cranial nerve deficit. Coordination normal.  Skin: Skin is warm and dry. No rash noted. No erythema. No pallor.  Psychiatric: She has a normal mood and affect.          Assessment & Plan:

## 2011-04-03 NOTE — Assessment & Plan Note (Addendum)
Fairly controlled with diet and zocor - but could be better  Rev lab with pt Rev goals with pt Rev low sat fat diet also - overall does fairly well  No changes  Lab today

## 2011-04-03 NOTE — Assessment & Plan Note (Addendum)
In fair control with current meds- and outside of the office better readings  No change in meds - tenormin only - no side eff Enc her to stay as active as she can and avoid sodium Could not enc her to get a flu shot  Lab today

## 2011-07-02 ENCOUNTER — Encounter: Payer: Self-pay | Admitting: Family Medicine

## 2011-07-02 ENCOUNTER — Ambulatory Visit (INDEPENDENT_AMBULATORY_CARE_PROVIDER_SITE_OTHER): Payer: Medicare HMO | Admitting: Family Medicine

## 2011-07-02 VITALS — BP 148/80 | HR 80 | Temp 98.1°F | Ht 64.25 in | Wt 122.0 lb

## 2011-07-02 DIAGNOSIS — R21 Rash and other nonspecific skin eruption: Secondary | ICD-10-CM | POA: Insufficient documentation

## 2011-07-02 DIAGNOSIS — R197 Diarrhea, unspecified: Secondary | ICD-10-CM

## 2011-07-02 NOTE — Progress Notes (Signed)
Subjective:    Patient ID: Paula Jimenez, female    DOB: 11-06-1926, 78 y.o.   MRN: [id number]  HPI Got up with diarrhea 1 am on dec 30th -- was quite severe with watery stool and a little cramping  Lot of gas  No blood in her stool  No n/v  After that - every time she eats - she has an urgent bm  No recent antibiotics  Has not visited anyone in a hospital  Last thing she ate-- ? Unsure (did eat hot dog and fries for lunch that day)  She did take an immodium pill times 2 the first night   Never had a colonoscopy   Has a rash on both legs - all the sudden it just came up  Started December 31st -- wondered about immodium  Broke out into spots  Had this briefly as a kid - thought it was an allergy  Rash is still there  Not veryi itchy  Wears supp hose   Patient Active Problem List  Diagnoses  . MELANOMA, LEG  . BASAL CELL CARCINOMA, NOSE  . HYPERLIPIDEMIA  . HEARING LOSS, BILATERAL  . HYPERTENSION, BENIGN  . MITRAL VALVE PROLAPSE  . STASIS DERMATITIS  . ALLERGIC RHINITIS  . ASTHMA  . DEGENERATIVE DISC DISEASE, LUMBOSACRAL SPINE  . OSTEOPOROSIS  . PALPITATIONS  . BASAL CELL CARCINOMA, NOSE  . STRESS REACTION, ACUTE, WITH EMOTIONAL DISTURBANCE  . Diarrhea  . Rash   Past Medical History  Diagnosis Date  . Allergy   . Asthma   . Hyperlipidemia   . Osteoporosis   . Hypertension   . Bursitis, hip   . Osteopenia 08/01   Past Surgical History  Procedure Date  . Abdominal hysterectomy   . Breast biopsy 06/99    fibrocystic  . Orif radial fracture 11/99    arm fracture, radial no surgery   History  Substance Use Topics  . Smoking status: Never Smoker   . Smokeless tobacco: Not on file  . Alcohol Use: No   Family History  Problem Relation Age of Onset  . Leukemia Sister   . Cancer Sister     leukemia  . Heart disease Mother   . Diabetes Mother   . Stroke Father    Allergies  Allergen Reactions  . Atenolol     REACTION: can't tolerate whole pill  .  Codeine   . Penicillins     REACTION: mouth swelling  . Raloxifene     REACTION: rash  . Rofecoxib   . Sulfamethoxazole W/Trimethoprim     REACTION: unknown   Current Outpatient Prescriptions on File Prior to Visit  Medication Sig Dispense Refill  . atenolol (TENORMIN) 50 MG tablet Take 1 tablet (50 mg total) by mouth daily.  30 tablet  11  . risedronate (ACTONEL) 35 MG tablet Take 1 tablet (35 mg total) by mouth every 7 (seven) days. with water on empty stomach, nothing by mouth or lie down for next 30 minutes.  4 tablet  11  . simvastatin (ZOCOR) 20 MG tablet Take 1 tablet (20 mg total) by mouth at bedtime.  30 tablet  11  . acetaminophen (TYLENOL) 500 MG tablet Take 500 mg by mouth every 6 (six) hours as needed.        . fish oil-omega-3 fatty acids 1000 MG capsule Take 1 g by mouth daily.        [name] loratadine (CLARITIN) 10 MG tablet Take 10 mg by mouth  daily.        . Multiple Vitamins-Minerals (MULTIVITAMIN WITH MINERALS) tablet Take 1 tablet by mouth daily.        . naproxen sodium (ANAPROX) 220 MG tablet Take 220 mg by mouth 2 (two) times daily with a meal.             Review of Systems Review of Systems  Constitutional: Negative for fever, appetite change, fatigue and unexpected weight change.  Eyes: Negative for pain and visual disturbance.  Respiratory: Negative for cough and shortness of breath.   Cardiovascular: Negative for cp or palpitations    Gastrointestinal: Negative for nausea,  and constipation. neg for blood in stool or abd pain  Genitourinary: Negative for urgency and frequency.  Skin: Negative for pallor  Neurological: Negative for weakness, light-headedness, numbness and headaches.  Hematological: Negative for adenopathy. Does not bruise/bleed easily.  Psychiatric/Behavioral: Negative for dysphoric mood. The patient is not nervous/anxious.          Objective:   Physical Exam  Constitutional: She appears well-developed and well-nourished. No distress.        Elderly, well appearing   HENT:  Head: Normocephalic and atraumatic.  Mouth/Throat: Oropharynx is clear and moist.  Eyes: Conjunctivae and EOM are normal. Pupils are equal, round, and reactive to light. No scleral icterus.  Neck: Normal range of motion. Neck supple. No JVD present. Carotid bruit is not present. No thyromegaly present.  Cardiovascular: Normal rate, regular rhythm and normal heart sounds.        Pulses are difficult to palpate   Abdominal: Normal appearance and bowel sounds are normal. She exhibits no distension, no abdominal bruit and no mass. There is no hepatosplenomegaly. There is no tenderness. There is no CVA tenderness.  Musculoskeletal: Normal range of motion. She exhibits no edema and no tenderness.  Lymphadenopathy:    She has no cervical adenopathy.  Neurological: She is alert. She has normal reflexes. No cranial nerve deficit. She exhibits normal muscle tone. Coordination normal.  Skin: Skin is warm and dry. Rash noted. No pallor.       Lower legs have small erythematous macules that are non blanching and not raised or tender Upper legs - more blotchy redness- larger areas  No excoriation or skin breakdown  Psychiatric: She has a normal mood and affect.          Assessment & Plan:

## 2011-07-02 NOTE — Patient Instructions (Signed)
Stick with a bland diet  Start align over the counter (a probiotic) as directed Also start fiber supplement daily like metamucil or citrucel  If worse- let me know  Keep a good fluid intake  Labs today Stool tests today  I suspect you have had a virus and your colon needs to get bacterial balance back to normal  Avoid perfumes/ scented products for the rash / use a free laundry detergent and no fabric softener sheets

## 2011-07-02 NOTE — Assessment & Plan Note (Signed)
On legs looks almost like purpura - will check cbc today  Not too symptomatic  Is wearing her supp hose

## 2011-07-02 NOTE — Assessment & Plan Note (Signed)
Recurrent urgent loose stools after a 1-2 day bout of some sort of gastroentertitis No n/v or pain  Reassuring exam Perhaps bacterial imbalance Will start align otc and daily fiber Lab and stool studies ordered Never had colonosc -wants to avoid Update if not starting to improve in a week or if worsening

## 2011-07-03 LAB — CBC WITH DIFFERENTIAL/PLATELET
Basophils Relative: 0.4 % (ref 0.0–3.0)
Eosinophils Relative: 1.2 % (ref 0.0–5.0)
Lymphs Abs: 1.6 10*3/uL (ref 0.7–4.0)
MCHC: 34.1 g/dL (ref 30.0–36.0)
MCV: 94 fl (ref 78.0–100.0)
Monocytes Relative: 5.3 % (ref 3.0–12.0)
Neutro Abs: 5.6 10*3/uL (ref 1.4–7.7)
Neutrophils Relative %: 72.3 % (ref 43.0–77.0)
Platelets: 276 10*3/uL (ref 150.0–400.0)
RBC: 4.24 Mil/uL (ref 3.87–5.11)
RDW: 13 % (ref 11.5–14.6)

## 2011-07-03 LAB — BASIC METABOLIC PANEL
CO2: 29 mEq/L (ref 19–32)
Chloride: 100 mEq/L (ref 96–112)
Potassium: 4.2 mEq/L (ref 3.5–5.1)
Sodium: 137 mEq/L (ref 135–145)

## 2011-07-04 LAB — CLOSTRIDIUM DIFFICILE EIA: CDIFTX: NEGATIVE

## 2011-07-08 ENCOUNTER — Telehealth: Payer: Self-pay | Admitting: Internal Medicine

## 2011-07-08 MED ORDER — AZITHROMYCIN 500 MG PO TABS: 500.0000 mg | ORAL_TABLET | Freq: Every day | ORAL | Status: AC

## 2011-07-08 NOTE — Telephone Encounter (Signed)
Please let pt know her stool came back pos for bacteria called campylobacter -this can be food or water bourne  I want to treat her with zithromax 500 mg one daily for 3 days Then f/u with me in about 2 weeks for re check  It may take a bit to get better  I'm unsure if this is the whole cause of the diarrhea - but will start by treating it Px written for call in

## 2011-07-08 NOTE — Telephone Encounter (Signed)
Left vm for pt to callback 

## 2011-07-08 NOTE — Telephone Encounter (Signed)
Paula Jimenez from labs states her stool culture is positive for camp lebacter species.

## 2011-07-09 NOTE — Telephone Encounter (Signed)
Patient notified as instructed by telephone. Medication phoned to [facility] pharmacy as instructed.appt scheduled for f/u 07/23/11 at 10am.

## 2011-07-10 ENCOUNTER — Telehealth: Payer: Self-pay | Admitting: Family Medicine

## 2011-07-10 NOTE — Telephone Encounter (Signed)
Triage Record Num: [id number] Operator: Claudie Leach Patient Name: Paula Jimenez Call Date & Time: 07/09/2011 5:01:52PM Patient Phone: [phone] PCP: Audrie Gallus. Tower Patient Gender: Female PCP Fax : Patient DOB: 1926-10-16 Practice Name: [facility] Reason for Call: Caller: Faye/Other; PCP: Roxy Manns A.; CB#: [phone]; Call regarding Questions about medication. Was called in Rx today for Azithromycin 500 mg for intestinal infection. Has Rash splotchy rash to inside of both legs. Has reddish rash to inner legs that started on 07/08/11. No itching. Thinks it may be reaction to Atenolol. Afebrile. Caller states Rash was looked at on 07/02/11 and no orders were given concerning this. Caller wants to know if patient should continue Align and Metamucil with the Azithromycin. Unable to open EPIC and notified Dr. Artist Pais regarding caller's question and he stated to continue Align and Metamucil with the Azithromycin. Caller made aware. Emergent sx of Rash r/o. Caller instructed to call office back in the morning and Care advice given. Protocol(s) Used: Rash Recommended Outcome per Protocol: See Provider within 24 hours Reason for Outcome: Generalized rash AND not responding to 8 hours of home care Care Advice: ~ Another adult should drive. ~ Call provider if symptoms worsen or new symptoms develop. ~ Wear loose-fitting, non-restricting clothing. ~ SYMPTOM / CONDITION MANAGEMENT ~ CAUTIONS ~ List, or take, all current prescription(s), nonprescription or alternative medication(s) to provider for evaluation. Call EMS 911 if develop signs and symptoms of anaphylaxis within minutes to several hours of exposure: severe difficulty breathing; rapid, weak or irregular pulse; pruritus, urticaria, swelling of face, lips, tongue, or throat causing tightness or difficulty swallowing; abdominal cramping, nausea, vomiting or diarrhea. ~ 07/09/2011 5:39:33PM Page 1 of 1 [id number]

## 2011-07-10 NOTE — Telephone Encounter (Signed)
I'm hesitant to substitute something else right away until we know if this is the cause of a rash - perhaps she could check bp at home or a pharmacy and report back to me? Generally being off med for a short time is not a problem

## 2011-07-10 NOTE — Telephone Encounter (Signed)
Lucendia Herrlich notified as instructed by telephone. Since Lucendia Herrlich has gotten home rash is better so Lucendia Herrlich wants to cut Atenolol in 1/2  For tonight and will call back update.

## 2011-07-10 NOTE — Telephone Encounter (Signed)
Tell pt to go ahead and hold the atenolol if she thinks it is causing a rash -- update me early next week with how she is doing... Or call earlier if worse Then will make a further plan

## 2011-07-10 NOTE — Telephone Encounter (Signed)
Paula Jimenez notified as instructed by phone and Lucendia Herrlich wanted me to double ck because a friends mother stopped her BP med and had a stroke. So Lucendia Herrlich wants to know if she should taper her off or just stop the Atenolol. Please advise. Lucendia Herrlich can be reached at [phone] at ext [phone].

## 2011-07-16 ENCOUNTER — Telehealth: Payer: Self-pay | Admitting: Family Medicine

## 2011-07-16 MED ORDER — AZITHROMYCIN 500 MG PO TABS: 500.0000 mg | ORAL_TABLET | Freq: Every day | ORAL | Status: AC

## 2011-07-16 NOTE — Telephone Encounter (Signed)
I'm going to repeat the course of abx Will refill electronically to [facility] Then follow up with me for a re check next week please

## 2011-07-16 NOTE — Telephone Encounter (Signed)
Patient came in for Diarrhea on 07/02/11.  Patient was told to call back, if she wasn't feeling better.  Patient took her medication and it did work.  Patient started having diarrhea again several days ago. Patient uses Facilities manager.

## 2011-07-16 NOTE — Telephone Encounter (Signed)
Patient notified as instructed by telephone. Pt already has appt with Dr Milinda Antis 07/23/11. Pt said if she did not get better before Wed she would call back.

## 2011-07-22 ENCOUNTER — Telehealth: Payer: Self-pay | Admitting: Family Medicine

## 2011-07-22 NOTE — Telephone Encounter (Signed)
Patient and Paula Jimenez notified as instructed by telephone. Pt thinks she will be able to get to office tomorrow.

## 2011-07-22 NOTE — Telephone Encounter (Signed)
If she is not able to get here in the am - please have her send someone in to pick up stool cups for her to do and send back to [place]  If she can make it - I really want to see her and examine her as well

## 2011-07-22 NOTE — Telephone Encounter (Signed)
Patient finished her antibiotic on Saturday.  She started having diarrhea again this morning.  Patient has a follow up appointment with you tomorrow and she's not sure she can drive to the appointment.  Please advise.  Patient uses Facilities manager.

## 2011-07-23 ENCOUNTER — Encounter: Payer: Self-pay | Admitting: Family Medicine

## 2011-07-23 ENCOUNTER — Ambulatory Visit (INDEPENDENT_AMBULATORY_CARE_PROVIDER_SITE_OTHER): Payer: Medicare HMO | Admitting: Family Medicine

## 2011-07-23 VITALS — BP 152/76 | HR 72 | Temp 97.9°F | Ht 64.25 in | Wt 122.2 lb

## 2011-07-23 DIAGNOSIS — A045 Campylobacter enteritis: Secondary | ICD-10-CM | POA: Insufficient documentation

## 2011-07-23 DIAGNOSIS — R197 Diarrhea, unspecified: Secondary | ICD-10-CM

## 2011-07-23 LAB — COMPREHENSIVE METABOLIC PANEL WITH GFR
ALT: 23 U/L (ref 0–35)
AST: 22 U/L (ref 0–37)
Albumin: 3.8 g/dL (ref 3.5–5.2)
Alkaline Phosphatase: 63 U/L (ref 39–117)
BUN: 10 mg/dL (ref 6–23)
CO2: 30 meq/L (ref 19–32)
Calcium: 8.9 mg/dL (ref 8.4–10.5)
Chloride: 101 meq/L (ref 96–112)
Creatinine, Ser: 0.5 mg/dL (ref 0.4–1.2)
GFR: 114.67 mL/min
Glucose, Bld: 92 mg/dL (ref 70–99)
Potassium: 4.2 meq/L (ref 3.5–5.1)
Sodium: 140 meq/L (ref 135–145)
Total Bilirubin: 0.4 mg/dL (ref 0.3–1.2)
Total Protein: 6.9 g/dL (ref 6.0–8.3)

## 2011-07-23 LAB — CBC WITH DIFFERENTIAL/PLATELET
Basophils Relative: 0.5 % (ref 0.0–3.0)
Eosinophils Absolute: 0.1 10*3/uL (ref 0.0–0.7)
Eosinophils Relative: 1.1 % (ref 0.0–5.0)
Lymphocytes Relative: 25.4 % (ref 12.0–46.0)
MCHC: 34.1 g/dL (ref 30.0–36.0)
Neutrophils Relative %: 65.2 % (ref 43.0–77.0)
RBC: 4.2 Mil/uL (ref 3.87–5.11)
WBC: 6.1 10*3/uL (ref 4.5–10.5)

## 2011-07-23 MED ORDER — LEVOFLOXACIN 500 MG PO TABS: 500.0000 mg | ORAL_TABLET | Freq: Every day | ORAL | Status: AC

## 2011-07-23 NOTE — Assessment & Plan Note (Signed)
campylobactor- treated twice with azith - imp and then reoccurs Lab today Repeat cx and c diff 3 d course of levaquin If still present- consider ID consult Update if not starting to improve in a week or if worsening

## 2011-07-23 NOTE — Progress Notes (Signed)
Subjective:    Patient ID: Paula Jimenez, female    DOB: [birth year], 78 y.o.   MRN: [id number]  HPI Here for recurrent diarrhea Last visit stool cx showed campylobacter species tx with azithromycin 500 3 d -- got better , then worse  After 3-4 days  Repeated that - same thing- got better -than worse after 4 days  Loose/ watery stool - worse in the am Does not wake her up at night No fever occ mild gas cramping -that is all  Most recently ate pinto beans/ baked potato/ banana pudding   This am headache mild in temples   Does tend to eat a lot of chicken - does not eat out a lot , though  Last thing she ate before this started was hot dog at [employer]    ? Where she got it    Was worse yesterday than today  Overdid it -was out cutting vines   Patient Active Problem List  Diagnoses  . MELANOMA, LEG  . BASAL CELL CARCINOMA, NOSE  . HYPERLIPIDEMIA  . HEARING LOSS, BILATERAL  . HYPERTENSION, BENIGN  . MITRAL VALVE PROLAPSE  . STASIS DERMATITIS  . ALLERGIC RHINITIS  . ASTHMA  . DEGENERATIVE DISC DISEASE, LUMBOSACRAL SPINE  . OSTEOPOROSIS  . PALPITATIONS  . BASAL CELL CARCINOMA, NOSE  . STRESS REACTION, ACUTE, WITH EMOTIONAL DISTURBANCE  . Diarrhea  . Rash  . Campylobacter diarrhea   Past Medical History  Diagnosis Date  . Allergy   . Asthma   . Hyperlipidemia   . Osteoporosis   . Hypertension   . Bursitis, hip   . Osteopenia 08/01   Past Surgical History  Procedure Date  . Abdominal hysterectomy   . Breast biopsy 06/99    fibrocystic  . Orif radial fracture 11/99    arm fracture, radial no surgery   History  Substance Use Topics  . Smoking status: Never Smoker   . Smokeless tobacco: Not on file  . Alcohol Use: No   Family History  Problem Relation Age of Onset  . Leukemia Sister   . Cancer Sister     leukemia  . Heart disease Mother   . Diabetes Mother   . Stroke Father    Allergies  Allergen Reactions  . Atenolol     REACTION: can't  tolerate whole pill  . Codeine   . Penicillins     REACTION: mouth swelling  . Raloxifene     REACTION: rash  . Rofecoxib   . Sulfamethoxazole W/Trimethoprim     REACTION: unknown   Current Outpatient Prescriptions on File Prior to Visit  Medication Sig Dispense Refill  . acetaminophen (TYLENOL) 500 MG tablet Take 500 mg by mouth every 6 (six) hours as needed.        [name] atenolol (TENORMIN) 50 MG tablet Take 1 tablet (50 mg total) by mouth daily.  30 tablet  11  . fish oil-omega-3 fatty acids 1000 MG capsule Take 1 g by mouth daily.        [name] loratadine (CLARITIN) 10 MG tablet Take 10 mg by mouth daily.        . naproxen sodium (ANAPROX) 220 MG tablet Take 220 mg by mouth 2 (two) times daily with a meal.        . risedronate (ACTONEL) 35 MG tablet Take 1 tablet (35 mg total) by mouth every 7 (seven) days. with water on empty stomach, nothing by mouth or lie down for next 30 minutes.  4 tablet  11  . simvastatin (ZOCOR) 20 MG tablet Take 1 tablet (20 mg total) by mouth at bedtime.  30 tablet  11  . Multiple Vitamins-Minerals (MULTIVITAMIN WITH MINERALS) tablet Take 1 tablet by mouth daily.            Review of Systems Review of Systems  Constitutional: Negative for fever, appetite change, fatigue and unexpected weight change.  Eyes: Negative for pain and visual disturbance.  Respiratory: Negative for cough and shortness of breath.   Cardiovascular: Negative for cp or palpitations    Gastrointestinal: Negative for nausea, and constipation. neg for blood in stool or abd pain  Genitourinary: Negative for urgency and frequency.  Skin: Negative for pallor or rash   MSK no muscle cramps  Neurological: Negative for weakness, light-headedness, numbness and headaches.  Hematological: Negative for adenopathy. Does not bruise/bleed easily.  Psychiatric/Behavioral: Negative for dysphoric mood. The patient is not nervous/anxious.          Objective:   Physical Exam  Constitutional: She  appears well-developed and well-nourished. No distress.  HENT:  Head: Normocephalic and atraumatic.  Mouth/Throat: Oropharynx is clear and moist.  Eyes: Conjunctivae and EOM are normal. Pupils are equal, round, and reactive to light. Right eye exhibits no discharge. Left eye exhibits no discharge. No scleral icterus.  Neck: Normal range of motion. Neck supple. No thyromegaly present.  Cardiovascular: Normal rate, regular rhythm and normal heart sounds.   Pulmonary/Chest: Effort normal and breath sounds normal. No respiratory distress. She has no wheezes.  Abdominal: Soft. Bowel sounds are normal. She exhibits no distension and no mass. There is no tenderness.  Lymphadenopathy:    She has no cervical adenopathy.  Neurological: She is alert. She displays no tremor.  Skin: Skin is warm and dry. No rash noted. No pallor.       Nl cap refil time   Psychiatric: She has a normal mood and affect.          Assessment & Plan:

## 2011-07-23 NOTE — Patient Instructions (Signed)
Labs and stool tests today Keep up a good fluid intake BRAT diet is banana/ rice/ applesauce/ toast  Take the levaquin as directed  Then we will make a plan after labs return Update if not starting to improve in a week or if worsening

## 2011-07-23 NOTE — Assessment & Plan Note (Signed)
Has been tx with azithromycin times 2 with relief and then diarrhea returns Repeat cx (also c diff) then tx with levaquin If still present - consider ID consult ? Where she could have picked this up

## 2011-07-28 LAB — STOOL CULTURE

## 2011-07-29 ENCOUNTER — Telehealth: Payer: Self-pay | Admitting: *Deleted

## 2011-07-29 NOTE — Telephone Encounter (Signed)
Patient notified as instructed by telephone. 

## 2011-07-29 NOTE — Telephone Encounter (Signed)
Depends on symptoms -how is she doing with the diarrhea?

## 2011-07-29 NOTE — Telephone Encounter (Signed)
Patient notified as instructed by telephone. Pt states diarrhea stopped. Pt had formed BM on 07/28/11 and today 07/29/11.

## 2011-07-29 NOTE — Telephone Encounter (Signed)
Great!- continue the align for another week Can stop the metamucil Gradually work back to a normal  Diet and let me know if diarrhea returns

## 2011-07-29 NOTE — Telephone Encounter (Signed)
Patient calling and wants to know if she should be eating regular food or doing bland diet for now. Patient also wants to know if she should still be taking align and metamucil. Please advise

## 2011-10-16 ENCOUNTER — Telehealth: Payer: Self-pay | Admitting: Family Medicine

## 2011-10-16 ENCOUNTER — Ambulatory Visit (INDEPENDENT_AMBULATORY_CARE_PROVIDER_SITE_OTHER): Payer: Medicare HMO | Admitting: Internal Medicine

## 2011-10-16 ENCOUNTER — Encounter: Payer: Self-pay | Admitting: Internal Medicine

## 2011-10-16 VITALS — BP 148/80 | HR 66 | Temp 98.1°F | Wt 121.0 lb

## 2011-10-16 DIAGNOSIS — N390 Urinary tract infection, site not specified: Secondary | ICD-10-CM

## 2011-10-16 DIAGNOSIS — R300 Dysuria: Secondary | ICD-10-CM

## 2011-10-16 LAB — POCT URINALYSIS DIPSTICK
Bilirubin, UA: NEGATIVE
Glucose, UA: NEGATIVE
Ketones, UA: NEGATIVE
Spec Grav, UA: 1.01

## 2011-10-16 MED ORDER — CIPROFLOXACIN HCL 250 MG PO TABS: 250.0000 mg | ORAL_TABLET | Freq: Two times a day (BID) | ORAL | Status: AC

## 2011-10-16 NOTE — Assessment & Plan Note (Signed)
Last infection July 2012 E. Coli---pan sensitive  Will treat with cipro

## 2011-10-16 NOTE — Telephone Encounter (Signed)
Per Emergent Line: Caller: Paula Jimenez/Patient; PCP: Paula Manns A.; CB#: [phone]. Caller reports she developed sxs of UTI on Wed 4/24 that include freq, urgency and pain. Caller reports hx of same, at least x 4 in recent past. Afebrile. Per Urinary Sxs Protocol, see in 24 hrs. PCP has no appts for today. Caller asking if she can get meds called in for sxs since she has recorded hx of uti. RN advised she should be seen for eval of sxs. Scheduled at 16:15 today, Thurs 4/25 with Dr Alphonsus Sias. Caller is agreeable. Home care reviewed.

## 2011-10-16 NOTE — Progress Notes (Signed)
Subjective:    Patient ID: Paula Jimenez, female    DOB: Aug 09, 1926, 78 y.o.   MRN: [id number]  HPI Having bladder infection Has had recurrent infections though not recently  Gets some pressure, then dysuria Started yesterday afternoon and worse this Am Has urgency today No visible hematuria  No fever No back pain---except chronic low back pain with her yard work  Current Outpatient Prescriptions on File Prior to Visit  Medication Sig Dispense Refill  . acetaminophen (TYLENOL) 500 MG tablet Take 500 mg by mouth every 6 (six) hours as needed.        [name] atenolol (TENORMIN) 50 MG tablet Take 1 tablet (50 mg total) by mouth daily.  30 tablet  11  . fish oil-omega-3 fatty acids 1000 MG capsule Take 1 g by mouth daily.        [name] loratadine (CLARITIN) 10 MG tablet Take 10 mg by mouth daily.        . Multiple Vitamins-Minerals (MULTIVITAMIN WITH MINERALS) tablet Take 1 tablet by mouth daily.        . naproxen sodium (ANAPROX) 220 MG tablet Take 220 mg by mouth 2 (two) times daily with a meal.        . risedronate (ACTONEL) 35 MG tablet Take 1 tablet (35 mg total) by mouth every 7 (seven) days. with water on empty stomach, nothing by mouth or lie down for next 30 minutes.  4 tablet  11  . simvastatin (ZOCOR) 20 MG tablet Take 1 tablet (20 mg total) by mouth at bedtime.  30 tablet  11    Allergies  Allergen Reactions  . Atenolol     REACTION: can't tolerate whole pill  . Codeine   . Penicillins     REACTION: mouth swelling  . Raloxifene     REACTION: rash  . Rofecoxib   . Sulfamethoxazole W/Trimethoprim     REACTION: unknown    Past Medical History  Diagnosis Date  . Allergy   . Asthma   . Hyperlipidemia   . Osteoporosis   . Hypertension   . Bursitis, hip   . Osteopenia 08/01    Past Surgical History  Procedure Date  . Abdominal hysterectomy   . Breast biopsy 06/99    fibrocystic  . Orif radial fracture 11/99    arm fracture, radial no surgery    Family History    Problem Relation Age of Onset  . Leukemia Sister   . Cancer Sister     leukemia  . Heart disease Mother   . Diabetes Mother   . Stroke Father     History   Social History  . Marital Status: Widowed    Spouse Name: N/A    Number of Children: 4  . Years of Education: N/A   Occupational History  . retired    Social History Main Topics  . Smoking status: Never Smoker   . Smokeless tobacco: Not on file  . Alcohol Use: No  . Drug Use: No  . Sexually Active: Not on file   Other Topics Concern  . Not on file   Social History Narrative   caring for daughter with leg injurycaring for sister with leukemia    Review of Systems No nausea or vomiting Appetite is off slightly     Objective:   Physical Exam  Constitutional: She appears well-developed and well-nourished. No distress.  Abdominal: Soft. She exhibits no distension. There is no tenderness.  Musculoskeletal:  No CVA tenderness          Assessment & Plan:

## 2011-11-10 ENCOUNTER — Telehealth: Payer: Self-pay | Admitting: Family Medicine

## 2011-11-10 DIAGNOSIS — I10 Essential (primary) hypertension: Secondary | ICD-10-CM

## 2011-11-10 DIAGNOSIS — E785 Hyperlipidemia, unspecified: Secondary | ICD-10-CM

## 2011-11-10 DIAGNOSIS — M810 Age-related osteoporosis without current pathological fracture: Secondary | ICD-10-CM

## 2011-11-10 NOTE — Telephone Encounter (Signed)
Message copied by [name] on Mon Nov 10, 2011  8:31 PM ------      Message from: Alvina Chou      Created: Mon Nov 10, 2011 10:42 AM      Regarding: Labs for 5-21       Patient is scheduled for CPX labs, please order future labs, Thanks , Camelia Eng

## 2011-11-11 ENCOUNTER — Other Ambulatory Visit (INDEPENDENT_AMBULATORY_CARE_PROVIDER_SITE_OTHER): Payer: Medicare HMO

## 2011-11-11 DIAGNOSIS — I10 Essential (primary) hypertension: Secondary | ICD-10-CM

## 2011-11-11 DIAGNOSIS — E785 Hyperlipidemia, unspecified: Secondary | ICD-10-CM

## 2011-11-11 DIAGNOSIS — M810 Age-related osteoporosis without current pathological fracture: Secondary | ICD-10-CM

## 2011-11-11 LAB — TSH: TSH: 1.27 u[IU]/mL (ref 0.35–5.50)

## 2011-11-11 LAB — CBC WITH DIFFERENTIAL/PLATELET
Basophils Relative: 0.4 % (ref 0.0–3.0)
Eosinophils Relative: 0.7 % (ref 0.0–5.0)
HCT: 43.7 % (ref 36.0–46.0)
Lymphs Abs: 1.7 10*3/uL (ref 0.7–4.0)
MCV: 94.9 fl (ref 78.0–100.0)
Monocytes Absolute: 0.4 10*3/uL (ref 0.1–1.0)
RBC: 4.61 Mil/uL (ref 3.87–5.11)
WBC: 6.2 10*3/uL (ref 4.5–10.5)

## 2011-11-11 LAB — COMPREHENSIVE METABOLIC PANEL
Albumin: 4 g/dL (ref 3.5–5.2)
Alkaline Phosphatase: 60 U/L (ref 39–117)
BUN: 11 mg/dL (ref 6–23)
CO2: 30 mEq/L (ref 19–32)
GFR: 86.35 mL/min (ref >60.00)
Glucose, Bld: 91 mg/dL (ref 70–99)
Potassium: 3.9 mEq/L (ref 3.5–5.1)

## 2011-11-11 LAB — LIPID PANEL
Cholesterol: 174 mg/dL (ref 0–200)
VLDL: 25 mg/dL (ref 0.0–40.0)

## 2011-11-12 LAB — VITAMIN D 25 HYDROXY (VIT D DEFICIENCY, FRACTURES): Vit D, 25-Hydroxy: 48 ng/mL (ref 30–89)

## 2011-11-19 ENCOUNTER — Ambulatory Visit (INDEPENDENT_AMBULATORY_CARE_PROVIDER_SITE_OTHER): Payer: Medicare HMO | Admitting: Family Medicine

## 2011-11-19 ENCOUNTER — Encounter: Payer: Self-pay | Admitting: Family Medicine

## 2011-11-19 VITALS — BP 130/86 | HR 67 | Temp 98.6°F | Ht 64.25 in | Wt 121.8 lb

## 2011-11-19 DIAGNOSIS — M810 Age-related osteoporosis without current pathological fracture: Secondary | ICD-10-CM

## 2011-11-19 DIAGNOSIS — Z1211 Encounter for screening for malignant neoplasm of colon: Secondary | ICD-10-CM | POA: Insufficient documentation

## 2011-11-19 DIAGNOSIS — Z01419 Encounter for gynecological examination (general) (routine) without abnormal findings: Secondary | ICD-10-CM | POA: Insufficient documentation

## 2011-11-19 DIAGNOSIS — Z1231 Encounter for screening mammogram for malignant neoplasm of breast: Secondary | ICD-10-CM

## 2011-11-19 DIAGNOSIS — Z23 Encounter for immunization: Secondary | ICD-10-CM

## 2011-11-19 NOTE — Assessment & Plan Note (Signed)
Nl rectal/ guiac neg today  Given IFOB Son has rectal cancer but family has not told her yet She declines colonosc

## 2011-11-19 NOTE — Progress Notes (Signed)
Subjective:    Patient ID: Paula Jimenez, female    DOB: November 27, 1926, 78 y.o.   MRN: [id number]  HPI Here for check up of chronic medical conditions and to review health mt list  Thinks she is doing ok overall   Notices her balance is getting worse  Also occ light headed spells   (usually happens 2-3 hours after she gets up) Daughter worried she is not eating enough  Does not use walker  No falls  Carpet in her house  Has rugs in kitchen     bp is   good  Today BP Readings from Last 3 Encounters:  11/19/11 130/86  10/16/11 148/80  07/23/11 152/76    No cp or palpitations or headaches or edema  No side effects to medicines    Wt is stable with bmi of 20  On zocor for lipids Diet is fair-- is fairly careful at home  Lab Results  Component Value Date   CHOL 174 11/11/2011   CHOL 163 04/02/2011   CHOL 183 05/08/2010   Lab Results  Component Value Date   HDL 58.90 11/11/2011   HDL 29.56 04/02/2011   HDL 58.20 05/08/2010   Lab Results  Component Value Date   LDLCALC 90 11/11/2011   LDLCALC 84 04/02/2011   LDLCALC 100* 05/08/2010   Lab Results  Component Value Date   TRIG 125.0 11/11/2011   TRIG 99.0 04/02/2011   TRIG 122.0 05/08/2010   Lab Results  Component Value Date   CHOLHDL 3 11/11/2011   CHOLHDL 3 04/02/2011   CHOLHDL 3 05/08/2010   Lab Results  Component Value Date   LDLDIRECT 159.5 02/19/2009   LDLDIRECT 158.2 11/10/2008   LDLDIRECT 138.6 10/26/2006     Hx of osteopenia On actonel- no problems or side effects -- has not been on it for 5 year Ca and D dexa was 7/10 Vit D level is good at 48  Hx of hysterectomy partial  No gyn symptoms at all  Has a hemorroid - bothers her occ - occ constipation   mammo 5/12 normal- is due and they sent her a letter  [facility] the breast center on [address]  Self exam -- no lumps , but her R breast is sore on and off - normal for her   Colon cancer screen Never had colonoscopy  Will do IFOB   Zoster  status Never had vaccine - is interested if it is afford  Pneumonia vaccine- is interested   Declines flu shot   Son has rectal cancer (she does not know that yet)- daughter told me today  Patient Active Problem List  Diagnoses  . MELANOMA, LEG  . BASAL CELL CARCINOMA, NOSE  . HYPERLIPIDEMIA  . HEARING LOSS, BILATERAL  . HYPERTENSION, BENIGN  . MITRAL VALVE PROLAPSE  . STASIS DERMATITIS  . ALLERGIC RHINITIS  . ASTHMA  . DEGENERATIVE DISC DISEASE, LUMBOSACRAL SPINE  . OSTEOPOROSIS  . PALPITATIONS  . BASAL CELL CARCINOMA, NOSE  . STRESS REACTION, ACUTE, WITH EMOTIONAL DISTURBANCE  . Diarrhea  . Rash  . Campylobacter diarrhea  . UTI (lower urinary tract infection)  . Gynecological examination  . Colon cancer screening  . Other screening mammogram   Past Medical History  Diagnosis Date  . Allergy   . Asthma   . Hyperlipidemia   . Osteoporosis   . Hypertension   . Bursitis, hip   . Osteopenia 08/01   Past Surgical History  Procedure Date  . Abdominal  hysterectomy   . Breast biopsy 06/99    fibrocystic  . Orif radial fracture 11/99    arm fracture, radial no surgery   History  Substance Use Topics  . Smoking status: Never Smoker   . Smokeless tobacco: Not on file  . Alcohol Use: No   Family History  Problem Relation Age of Onset  . Leukemia Sister   . Cancer Sister     leukemia  . Heart disease Mother   . Diabetes Mother   . Stroke Father    Allergies  Allergen Reactions  . Atenolol     REACTION: can't tolerate whole pill  . Codeine   . Penicillins     REACTION: mouth swelling  . Raloxifene     REACTION: rash  . Rofecoxib   . Sulfamethoxazole W-Trimethoprim     REACTION: unknown   Current Outpatient Prescriptions on File Prior to Visit  Medication Sig Dispense Refill  . acetaminophen (TYLENOL) 500 MG tablet Take 500 mg by mouth every 6 (six) hours as needed.        [name] atenolol (TENORMIN) 50 MG tablet Take 1 tablet (50 mg total) by mouth  daily.  30 tablet  11  . fish oil-omega-3 fatty acids 1000 MG capsule Take 1 g by mouth daily.        [name] loratadine (CLARITIN) 10 MG tablet Take 10 mg by mouth daily.        . naproxen sodium (ANAPROX) 220 MG tablet Take 220 mg by mouth 2 (two) times daily with a meal.        . risedronate (ACTONEL) 35 MG tablet Take 1 tablet (35 mg total) by mouth every 7 (seven) days. with water on empty stomach, nothing by mouth or lie down for next 30 minutes.  4 tablet  11  . simvastatin (ZOCOR) 20 MG tablet Take 1 tablet (20 mg total) by mouth at bedtime.  30 tablet  11  . Multiple Vitamins-Minerals (MULTIVITAMIN WITH MINERALS) tablet Take 1 tablet by mouth daily.            Review of Systems Review of Systems  Constitutional: Negative for fever,, fatigue and unexpected weight change pos for appetite change.  Eyes: Negative for pain and visual disturbance.  Respiratory: Negative for cough and shortness of breath.   Cardiovascular: Negative for cp or palpitations    Gastrointestinal: Negative for nausea, diarrhea and constipation.  Genitourinary: Negative for urgency and frequency.  Skin: Negative for pallor or rash   Neurological: Negative for weakness, numbness and headaches. pos for light headedness when she does not eat enough Hematological: Negative for adenopathy. Does not bruise/bleed easily.  Psychiatric/Behavioral: Negative for dysphoric mood. The patient is not nervous/anxious.         Objective:   Physical Exam  Constitutional: She appears well-developed and well-nourished. No distress.  HENT:  Head: Normocephalic and atraumatic.  Right Ear: External ear normal.  Left Ear: External ear normal.  Nose: Nose normal.  Mouth/Throat: Oropharynx is clear and moist.  Eyes: Conjunctivae and EOM are normal. Pupils are equal, round, and reactive to light. No scleral icterus.  Neck: Normal range of motion. Neck supple. No JVD present. Carotid bruit is not present. No thyromegaly present.    Cardiovascular: Normal rate, regular rhythm, normal heart sounds and intact distal pulses.  Exam reveals no gallop.   Pulmonary/Chest: Effort normal and breath sounds normal. No respiratory distress. She has no wheezes. She exhibits no tenderness.  Abdominal: Soft. Bowel sounds  are normal. She exhibits no distension, no abdominal bruit and no mass. There is no tenderness.  Genitourinary: Vagina normal. No breast swelling, tenderness, discharge or bleeding.       o External genitalia nl appearing  o Urethral meatus nl appearing o Urethra  Not hyper mobile o Bladder nl and non tender o Vagina nl appearing / no discharge or lesions o Cervix  Surgically absent  o Uterus  Surgically absent  o Adnexa/parametria not palpable Rectum- nl tone/ heme neg stool, no masses noted  Perineum-nl appearing   Breast exam: No mass, nodules, thickening, tenderness, bulging, retraction, inflamation, nipple discharge or skin changes noted.  No axillary or clavicular LA.  Chaperoned exam.     Musculoskeletal: Normal range of motion. She exhibits no edema and no tenderness.       No kyphosis  Lymphadenopathy:    She has no cervical adenopathy.  Neurological: She is alert. She has normal reflexes. No cranial nerve deficit. She exhibits normal muscle tone. Coordination normal.  Skin: Skin is warm and dry. No rash noted. No erythema. No pallor.       Solar lentigos diffusely   Psychiatric: She has a normal mood and affect.          Assessment & Plan:

## 2011-11-19 NOTE — Patient Instructions (Addendum)
We will schedule bone density and mammogram at check out  Please do stool card If you are interested in a shingles/zoster vaccine - call your insurance to check on coverage,( you should not get it within 1 month of other vaccines) , then call us for a prescription  for it to take to a pharmacy that gives the shot    Pneumovax today Let me know what kind of cancer your sister has when you know

## 2011-11-20 NOTE — Assessment & Plan Note (Signed)
Nl exam in pt with hx of hysterectomy

## 2011-11-20 NOTE — Assessment & Plan Note (Signed)
On actonel Due for dexa That is scheduled Disc ca and D and exercise  Disc safety issues

## 2011-11-20 NOTE — Assessment & Plan Note (Signed)
Scheduled annual screening mammogram Nl breast exam today  Encouraged monthly self exams   

## 2011-11-25 ENCOUNTER — Ambulatory Visit (INDEPENDENT_AMBULATORY_CARE_PROVIDER_SITE_OTHER): Payer: Medicare HMO | Admitting: *Deleted

## 2011-11-25 DIAGNOSIS — Z1211 Encounter for screening for malignant neoplasm of colon: Secondary | ICD-10-CM

## 2011-11-25 LAB — FECAL OCCULT BLOOD, IMMUNOCHEMICAL: Fecal Occult Bld: NEGATIVE

## 2011-11-27 ENCOUNTER — Encounter: Payer: Self-pay | Admitting: Family Medicine

## 2011-11-27 ENCOUNTER — Other Ambulatory Visit: Payer: Self-pay | Admitting: *Deleted

## 2011-11-27 MED ORDER — ZOSTER VACCINE LIVE 19400 UNT/0.65ML ~~LOC~~ SOLR: 0.6500 mL | Freq: Once | SUBCUTANEOUS | Status: AC

## 2011-11-27 NOTE — Progress Notes (Signed)
Per patient request, insurance will cover Rx to pharmacy for administration; discussed w/MAT at last OV, post dated x1 month after Pneumonia immunization/SLS

## 2011-12-02 ENCOUNTER — Ambulatory Visit (INDEPENDENT_AMBULATORY_CARE_PROVIDER_SITE_OTHER)
Admission: RE | Admit: 2011-12-02 | Discharge: 2011-12-02 | Disposition: A | Discharge status: Home / Self Care | Payer: Medicare HMO | Source: Ambulatory Visit

## 2011-12-02 DIAGNOSIS — M810 Age-related osteoporosis without current pathological fracture: Secondary | ICD-10-CM

## 2011-12-22 ENCOUNTER — Encounter: Payer: Self-pay | Admitting: *Deleted

## 2012-03-25 ENCOUNTER — Other Ambulatory Visit: Payer: Self-pay | Admitting: Family Medicine

## 2012-04-05 ENCOUNTER — Other Ambulatory Visit: Payer: Self-pay | Admitting: Family Medicine

## 2012-09-13 ENCOUNTER — Other Ambulatory Visit: Payer: Self-pay | Admitting: Family Medicine

## 2012-09-13 NOTE — Telephone Encounter (Signed)
No recent appt and no future appt, ok to refill? 

## 2012-09-13 NOTE — Telephone Encounter (Signed)
Please schedule f/u for May or June and refil until then, thanks

## 2012-09-15 ENCOUNTER — Other Ambulatory Visit: Payer: Self-pay | Admitting: Family Medicine

## 2012-09-15 NOTE — Telephone Encounter (Signed)
appt scheduled for 11/09/12 and med refilled until then

## 2012-09-15 NOTE — Telephone Encounter (Signed)
No recent appt and no future appt, ok to refill? 

## 2012-09-15 NOTE — Telephone Encounter (Signed)
Please schedule f/u when able and refil until then, thanks

## 2012-09-20 ENCOUNTER — Ambulatory Visit (INDEPENDENT_AMBULATORY_CARE_PROVIDER_SITE_OTHER): Payer: Medicare HMO | Admitting: Family Medicine

## 2012-09-20 ENCOUNTER — Encounter: Payer: Self-pay | Admitting: Family Medicine

## 2012-09-20 VITALS — BP 146/78 | HR 72 | Temp 97.3°F | Ht 64.25 in | Wt 127.5 lb

## 2012-09-20 DIAGNOSIS — L989 Disorder of the skin and subcutaneous tissue, unspecified: Secondary | ICD-10-CM

## 2012-09-20 NOTE — Patient Instructions (Addendum)
We will refer you to dermatology at check out for lesion on leg If it gets worse or starts to bother you - let me know

## 2012-09-20 NOTE — Assessment & Plan Note (Signed)
Different from stasis dermatitis she has had in the past 2-3 cm area of bright erythema under the skin with some satellite lesions below it- not raised - resembling something vascular or traumatic but does not blanche  Sent to dermatology Will update if any symptoms develop  rec she wear her support hose as usual - venous insuff appears stable

## 2012-09-20 NOTE — Progress Notes (Signed)
Subjective:    Patient ID: Paula Jimenez, female    DOB: 03/29/1927, 79 y.o.   MRN: [id number]  HPI Here with a spot on her leg- that started last week (left) Looked like a drop of blood at first -now is getting bigger and changing colors  No itching or pain (though this leg has bad veins that hurt) Usually wears her support hose (to the waist)  Feels fine No insect bites   Patient Active Problem List  Diagnosis  . MELANOMA, LEG  . BASAL CELL CARCINOMA, NOSE  . HYPERLIPIDEMIA  . HEARING LOSS, BILATERAL  . HYPERTENSION, BENIGN  . MITRAL VALVE PROLAPSE  . STASIS DERMATITIS  . ALLERGIC RHINITIS  . ASTHMA  . DEGENERATIVE DISC DISEASE, LUMBOSACRAL SPINE  . OSTEOPOROSIS  . PALPITATIONS  . BASAL CELL CARCINOMA, NOSE  . STRESS REACTION, ACUTE, WITH EMOTIONAL DISTURBANCE  . Diarrhea  . Rash  . Campylobacter diarrhea  . UTI (lower urinary tract infection)  . Gynecological examination  . Colon cancer screening  . Other screening mammogram   Past Medical History  Diagnosis Date  . Allergy   . Asthma   . Hyperlipidemia   . Osteoporosis   . Hypertension   . Bursitis, hip   . Osteopenia 08/01   Past Surgical History  Procedure Laterality Date  . Abdominal hysterectomy    . Breast biopsy  06/99    fibrocystic  . Orif radial fracture  11/99    arm fracture, radial no surgery   History  Substance Use Topics  . Smoking status: Never Smoker   . Smokeless tobacco: Not on file  . Alcohol Use: No   Family History  Problem Relation Age of Onset  . Leukemia Sister   . Cancer Sister     leukemia  . Heart disease Mother   . Diabetes Mother   . Stroke Father    Allergies  Allergen Reactions  . Atenolol     REACTION: can't tolerate whole pill  . Codeine   . Penicillins     REACTION: mouth swelling  . Raloxifene     REACTION: rash  . Rofecoxib   . Sulfamethoxazole W-Trimethoprim     REACTION: unknown   Current Outpatient Prescriptions on File Prior to Visit   Medication Sig Dispense Refill  . acetaminophen (TYLENOL) 500 MG tablet Take 500 mg by mouth every 6 (six) hours as needed.        . ACTONEL 35 MG tablet TAKE 1 TABLET BY MOUTH ON THE SAME DAY EVERY WEEK 30 MINUTES BEFORE YOUR FIRST FOOD OF THE DAY  4 tablet  1  . atenolol (TENORMIN) 50 MG tablet TAKE ONE (1) TABLET BY MOUTH EVERY      DAY  30 tablet  5  . fish oil-omega-3 fatty acids 1000 MG capsule Take 1 g by mouth daily.        [name] loratadine (CLARITIN) 10 MG tablet Take 10 mg by mouth as needed.       . Multiple Vitamins-Minerals (MULTIVITAMIN WITH MINERALS) tablet Take 1 tablet by mouth daily.        . naproxen sodium (ANAPROX) 220 MG tablet Take 220 mg by mouth 2 (two) times daily with a meal.        . simvastatin (ZOCOR) 20 MG tablet TAKE ONE TABLET BY MOUTH EVERY NIGHT AT BEDTIME  30 tablet  1   No current facility-administered medications on file prior to visit.      Review of  Systems Review of Systems  Constitutional: Negative for fever, appetite change, fatigue and unexpected weight change.  Eyes: Negative for pain and visual disturbance.  Respiratory: Negative for cough and shortness of breath.   Cardiovascular: Negative for cp or palpitations    Gastrointestinal: Negative for nausea, diarrhea and constipation.  Genitourinary: Negative for urgency and frequency.  Skin: Negative for pallor or rash  neg for itching or bleeding  Neurological: Negative for weakness, light-headedness, numbness and headaches.  Hematological: Negative for adenopathy. Does not bruise/bleed easily.  Psychiatric/Behavioral: Negative for dysphoric mood. The patient is not nervous/anxious.         Objective:   Physical Exam  Constitutional: She appears well-developed and well-nourished. No distress.  Eyes: Conjunctivae and EOM are normal. Pupils are equal, round, and reactive to light.  Cardiovascular: Normal rate and regular rhythm.   Musculoskeletal: She exhibits no edema and no tenderness.   Varicosities noted   Neurological: She is alert.  Skin: Skin is warm and dry. No rash noted. No pallor.  2-3 cm area on L calf - bright erythema- resembling vascular or traumatic lesion with several satellite lesions below it  Non tender No broken skin   Baseline varicosities noted No scale or excoriations  Psychiatric: She has a normal mood and affect.          Assessment & Plan:

## 2012-10-04 ENCOUNTER — Other Ambulatory Visit: Payer: Self-pay | Admitting: Family Medicine

## 2012-11-09 ENCOUNTER — Ambulatory Visit (INDEPENDENT_AMBULATORY_CARE_PROVIDER_SITE_OTHER): Payer: Medicare HMO | Admitting: Family Medicine

## 2012-11-09 ENCOUNTER — Encounter: Payer: Self-pay | Admitting: Family Medicine

## 2012-11-09 VITALS — BP 142/88 | HR 70 | Temp 98.1°F | Ht 64.25 in | Wt 126.0 lb

## 2012-11-09 DIAGNOSIS — E785 Hyperlipidemia, unspecified: Secondary | ICD-10-CM

## 2012-11-09 DIAGNOSIS — M810 Age-related osteoporosis without current pathological fracture: Secondary | ICD-10-CM

## 2012-11-09 DIAGNOSIS — I10 Essential (primary) hypertension: Secondary | ICD-10-CM

## 2012-11-09 LAB — COMPREHENSIVE METABOLIC PANEL
ALT: 21 U/L (ref 0–35)
AST: 19 U/L (ref 0–37)
Albumin: 3.4 g/dL — ABNORMAL LOW (ref 3.5–5.2)
Alkaline Phosphatase: 62 U/L (ref 39–117)
BUN: 13 mg/dL (ref 6–23)
Calcium: 8.6 mg/dL (ref 8.4–10.5)
Chloride: 104 mEq/L (ref 96–112)
Potassium: 3.9 mEq/L (ref 3.5–5.1)
Sodium: 138 mEq/L (ref 135–145)
Total Protein: 6.7 g/dL (ref 6.0–8.3)

## 2012-11-09 LAB — LIPID PANEL
LDL Cholesterol: 93 mg/dL (ref 0–99)
Total CHOL/HDL Ratio: 3
VLDL: 19.8 mg/dL (ref 0.0–40.0)

## 2012-11-09 LAB — CBC WITH DIFFERENTIAL/PLATELET
Basophils Absolute: 0 10*3/uL (ref 0.0–0.1)
Basophils Relative: 0.4 % (ref 0.0–3.0)
Eosinophils Absolute: 0.1 10*3/uL (ref 0.0–0.7)
Lymphocytes Relative: 25.3 % (ref 12.0–46.0)
MCHC: 34.4 g/dL (ref 30.0–36.0)
Neutrophils Relative %: 65.7 % (ref 43.0–77.0)
Platelets: 191 10*3/uL (ref 150.0–400.0)
RBC: 4.36 Mil/uL (ref 3.87–5.11)

## 2012-11-09 MED ORDER — ATENOLOL 50 MG PO TABS: 50.0000 mg | ORAL_TABLET | Freq: Every day | ORAL | Status: DC

## 2012-11-09 MED ORDER — SIMVASTATIN 20 MG PO TABS: 20.0000 mg | ORAL_TABLET | Freq: Every day | ORAL | Status: DC

## 2012-11-09 NOTE — Progress Notes (Signed)
Subjective:    Patient ID: Paula Jimenez, female    DOB: Nov 28, 1926, 79 y.o.   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 Skin lesion of left leg 09/20/2012  . Gynecological examination 11/19/2011  . Colon cancer screening 11/19/2011  . Other screening mammogram 11/19/2011  . Campylobacter diarrhea 07/23/2011  . STRESS REACTION, ACUTE, WITH EMOTIONAL DISTURBANCE 09/02/2010  . HEARING LOSS, BILATERAL 09/23/2007  . History of melanoma 02/25/2007  . BASAL CELL CARCINOMA, NOSE 02/25/2007  . HYPERLIPIDEMIA 02/25/2007  . MITRAL VALVE PROLAPSE 02/25/2007  . ALLERGIC RHINITIS 02/25/2007  . ASTHMA 02/25/2007  . DEGENERATIVE DISC DISEASE, LUMBOSACRAL SPINE 02/25/2007  . OSTEOPOROSIS 02/25/2007  . PALPITATIONS 02/25/2007  . BASAL CELL CARCINOMA, NOSE 02/25/2007  . HYPERTENSION, BENIGN 10/26/2006   Past Medical History  Diagnosis Date  . Allergy   . Asthma   . Hyperlipidemia   . Osteoporosis   . Hypertension   . Bursitis, hip   . Osteopenia 08/01   Past Surgical History  Procedure Laterality Date  . Abdominal hysterectomy    . Breast biopsy  06/99    fibrocystic  . Orif radial fracture  11/99    arm fracture, radial no surgery   History  Substance Use Topics  . Smoking status: Never Smoker   . Smokeless tobacco: Not on file  . Alcohol Use: No   Family History  Problem Relation Age of Onset  .  Leukemia Sister   . Cancer Sister     leukemia  . Heart disease Mother   . Diabetes Mother   . Stroke Father    Allergies  Allergen Reactions  . Atenolol     REACTION: can't tolerate whole pill  . Codeine   . Penicillins     REACTION: mouth swelling  . Raloxifene     REACTION: rash  . Rofecoxib   . Sulfamethoxazole W-Trimethoprim     REACTION: unknown   Current Outpatient Prescriptions on File Prior to Visit  Medication Sig Dispense Refill  . ACTONEL 35 MG tablet TAKE 1 TABLET BY MOUTH ON THE SAME DAY EVERY WEEK 30 MINUTES BEFORE YOUR FIRST FOOD OF THE DAY  4 tablet  1  . atenolol (TENORMIN) 50 MG tablet TAKE ONE (1) TABLET BY MOUTH EVERY DAY  30 tablet  1  . fish oil-omega-3 fatty acids 1000 MG capsule Take 1 g by mouth daily.        [name] loratadine (CLARITIN) 10 MG tablet Take 10 mg by mouth as needed.       . naproxen sodium (ANAPROX) 220 MG tablet Take 220 mg by mouth 2 (two) times daily with a meal.        . simvastatin (ZOCOR) 20 MG tablet TAKE ONE  TABLET BY MOUTH EVERY NIGHT AT BEDTIME  30 tablet  1  . VITAMIN D, CHOLECALCIFEROL, PO Take 1 tablet by mouth daily.        No current facility-administered medications on file prior to visit.    Review of Systems Review of Systems  Constitutional: Negative for fever, appetite change, fatigue and unexpected weight change.  Eyes: Negative for pain and visual disturbance.  Respiratory: Negative for cough and shortness of breath.   Cardiovascular: Negative for cp or palpitations    Gastrointestinal: Negative for nausea, diarrhea and constipation.  Genitourinary: Negative for urgency and frequency.  Skin: Negative for pallor or rash   MSK pos for intermittent hip pain / stiffness Neurological: Negative for weakness, light-headedness, numbness and headaches.  Hematological: Negative for adenopathy. Does not bruise/bleed easily.  Psychiatric/Behavioral: Negative for dysphoric mood. The patient is not nervous/anxious.          Objective:   Physical Exam  Constitutional: She appears well-developed and well-nourished. No distress.  HENT:  Head: Normocephalic and atraumatic.  Mouth/Throat: Oropharynx is clear and moist.  Eyes: Conjunctivae and EOM are normal. Pupils are equal, round, and reactive to light. No scleral icterus.  Neck: Normal range of motion. Neck supple. No JVD present. Carotid bruit is not present. No thyromegaly present.  Cardiovascular: Normal rate, regular rhythm, normal heart sounds and intact distal pulses.  Exam reveals no gallop.   Pulmonary/Chest: Breath sounds normal. No respiratory distress. She has no wheezes. She has no rales.  Abdominal: Soft. Bowel sounds are normal. She exhibits no distension, no abdominal bruit and no mass. There is no tenderness.  Musculoskeletal: She exhibits no edema and no tenderness.  Lymphadenopathy:    She has no cervical adenopathy.  Neurological: She is alert. She has normal reflexes. No cranial nerve deficit. She exhibits normal muscle tone. Coordination normal.  Skin: Skin is warm and dry. No rash noted. No erythema. No pallor.  Psychiatric: She has a normal mood and affect.          Assessment & Plan:

## 2012-11-09 NOTE — Assessment & Plan Note (Signed)
dexa up to date 6/13 Finished 5 y of actonel- will stop it Enc to continue ca and D D level today

## 2012-11-09 NOTE — Patient Instructions (Addendum)
Labs today Stop the actonel- you have been on it long enough Stay active physically and mentally  If you are interested in a shingles/zoster vaccine - call your insurance to check on coverage,( you should not get it within 1 month of other vaccines) , then call us for a prescription  for it to take to a pharmacy that gives the shot , or make a nurse visit to get it here depending on your coverage

## 2012-11-09 NOTE — Assessment & Plan Note (Signed)
Lab today On zocor and diet  Good habits

## 2012-11-09 NOTE — Assessment & Plan Note (Signed)
bp in fair control at this time  No changes needed  Disc lifstyle change with low sodium diet and exercise  Lab today  Med refilled

## 2012-11-10 ENCOUNTER — Encounter: Payer: Self-pay | Admitting: *Deleted

## 2012-11-10 LAB — VITAMIN D 25 HYDROXY (VIT D DEFICIENCY, FRACTURES): Vit D, 25-Hydroxy: 44 ng/mL (ref 30–89)

## 2012-11-16 ENCOUNTER — Telehealth: Payer: Self-pay | Admitting: Family Medicine

## 2012-11-16 ENCOUNTER — Telehealth: Payer: Self-pay

## 2012-11-16 NOTE — Telephone Encounter (Signed)
Patient Information:  Caller Name: Glyn  Phone: [phone]  Patient: Paula Jimenez, Paula Jimenez  Gender: Female  DOB: 1927/06/01  Age: 79 Years  PCP: Roxy Manns [facility])  Office Follow Up:  Does the office need to follow up with this patient?: No  Instructions For The Office: Contacted the office and spoke with Rena and appt scheduled for first available.    Symptoms  Reason For Call & Symptoms: Pt states she thinks she has UTI and possible bladder prolapse.  Reviewed Health History In EMR: Yes  Reviewed Medications In EMR: Yes  Reviewed Allergies In EMR: Yes  Reviewed Surgeries / Procedures: Yes  Date of Onset of Symptoms: 11/12/2012  Guideline(s) Used:  Urination Pain - Female  Disposition Per Guideline:   See Today in Office  Reason For Disposition Reached:   > 2 UTI's in last year  Advice Given:  Call Back If:  You become worse, any pain, elevated tempeture, go to Urgent Care or ED for evaluations.  Patient Will Follow Care Advice:  YES  Appointment Scheduled:  11/17/2012 11:45:00 Appointment Scheduled Provider:  Hannah Beat [facility])

## 2012-11-16 NOTE — Telephone Encounter (Signed)
Paula Jimenez with [facility]; pt has UTI symptoms and no available appts today; Paula Jimenez said thinks OK for pt to wait for appt 11/17/12 but all available are same day appts on 05/28. Blane Ohara, office manager said OK to schedule same day appt on 11/17/12. Paula Jimenez voiced understanding and will schedule appt for pt telling pt if condition changes or worsens prior to appt to go to UC for eval.

## 2012-11-17 ENCOUNTER — Ambulatory Visit (INDEPENDENT_AMBULATORY_CARE_PROVIDER_SITE_OTHER): Payer: Medicare HMO | Admitting: Family Medicine

## 2012-11-17 ENCOUNTER — Encounter: Payer: Self-pay | Admitting: Family Medicine

## 2012-11-17 VITALS — BP 130/92 | HR 75 | Temp 97.9°F | Ht 64.25 in | Wt 127.0 lb

## 2012-11-17 DIAGNOSIS — R300 Dysuria: Secondary | ICD-10-CM

## 2012-11-17 DIAGNOSIS — N390 Urinary tract infection, site not specified: Secondary | ICD-10-CM

## 2012-11-17 LAB — POCT URINALYSIS DIPSTICK
Protein, UA: 30
Urobilinogen, UA: NEGATIVE
pH, UA: 7

## 2012-11-17 MED ORDER — FLUCONAZOLE 150 MG PO TABS: 150.0000 mg | ORAL_TABLET | Freq: Once | ORAL | Status: DC

## 2012-11-17 MED ORDER — CIPROFLOXACIN HCL 250 MG PO TABS: 250.0000 mg | ORAL_TABLET | Freq: Two times a day (BID) | ORAL | Status: DC

## 2012-11-17 NOTE — Progress Notes (Signed)
Nature conservation officer at [facility] [address] [place] [address] Phone: [phone] Fax: [phone]  Date:  11/17/2012   Name:  Paula Jimenez   DOB:  Apr 07, 1927   MRN:  [id number] Gender: female Age: 79 y.o.  Primary Physician:  Roxy Manns, MD  Evaluating MD: Hannah Beat, MD   Chief Complaint: Urinary Tract Infection   History of Present Illness:  Paula Jimenez is a 79 y.o. pleasant patient who presents with the following:  Feels like bladder fell. Had a partial hysterectomy in the 1970's. Was down here the 20th and felt something else on the left.   Then thought was having -- some itching and burning on the vagina.  Also felt like needed to urinate more and some burning.  Patient Active Problem List   Diagnosis Date Noted  . Skin lesion of left leg 09/20/2012  . Gynecological examination 11/19/2011  . Colon cancer screening 11/19/2011  . Other screening mammogram 11/19/2011  . Campylobacter diarrhea 07/23/2011  . STRESS REACTION, ACUTE, WITH EMOTIONAL DISTURBANCE 09/02/2010  . HEARING LOSS, BILATERAL 09/23/2007  . History of melanoma 02/25/2007  . BASAL CELL CARCINOMA, NOSE 02/25/2007  . HYPERLIPIDEMIA 02/25/2007  . MITRAL VALVE PROLAPSE 02/25/2007  . ALLERGIC RHINITIS 02/25/2007  . ASTHMA 02/25/2007  . DEGENERATIVE DISC DISEASE, LUMBOSACRAL SPINE 02/25/2007  . OSTEOPOROSIS 02/25/2007  . PALPITATIONS 02/25/2007  . BASAL CELL CARCINOMA, NOSE 02/25/2007  . HYPERTENSION, BENIGN 10/26/2006    Past Medical History  Diagnosis Date  . Allergy   . Asthma   . Hyperlipidemia   . Osteoporosis   . Hypertension   . Bursitis, hip   . Osteopenia 08/01    Past Surgical History  Procedure Laterality Date  . Abdominal hysterectomy    . Breast biopsy  06/99    fibrocystic  . Orif radial fracture  11/99    arm fracture, radial no surgery    History   Social History  . Marital Status: Widowed    Spouse Name: N/A    Number of Children: 4  .  Years of Education: N/A   Occupational History  . retired    Social History Main Topics  . Smoking status: Never Smoker   . Smokeless tobacco: Not on file  . Alcohol Use: No  . Drug Use: No  . Sexually Active: Not on file   Other Topics Concern  . Not on file   Social History Narrative   caring for daughter with leg injury   caring for sister with leukemia           Family History  Problem Relation Age of Onset  . Leukemia Sister   . Cancer Sister     leukemia  . Heart disease Mother   . Diabetes Mother   . Stroke Father     Allergies  Allergen Reactions  . Atenolol     REACTION: can't tolerate whole pill  . Codeine   . Penicillins     REACTION: mouth swelling  . Raloxifene     REACTION: rash  . Rofecoxib   . Sulfamethoxazole W-Trimethoprim     REACTION: unknown    Medication list has been reviewed and updated.  Outpatient Prescriptions Prior to Visit  Medication Sig Dispense Refill  . atenolol (TENORMIN) 50 MG tablet Take 1 tablet (50 mg total) by mouth daily.  30 tablet  11  . fish oil-omega-3 fatty acids 1000 MG capsule Take 1 g by mouth daily.        [name]  loratadine (CLARITIN) 10 MG tablet Take 10 mg by mouth as needed.       . naproxen sodium (ANAPROX) 220 MG tablet Take 220 mg by mouth 2 (two) times daily with a meal.        . simvastatin (ZOCOR) 20 MG tablet Take 1 tablet (20 mg total) by mouth daily.  30 tablet  11  . VITAMIN D, CHOLECALCIFEROL, PO Take 1 tablet by mouth daily.        No facility-administered medications prior to visit.    Review of Systems:  ROS: GEN: Acute illness details above GI: Tolerating PO intake GU: maintaining adequate hydration and urination Pulm: No SOB Interactive and getting along well at home.  Otherwise, ROS is as per the HPI.   Physical Examination: BP 130/92  Pulse 75  Temp(Src) 97.9 F (36.6 C) (Oral)  Ht 5' 4.25" (1.632 m)  Wt 127 lb (57.607 kg)  BMI 21.63 kg/m2  SpO2 97%  Ideal Body Weight:  Weight in (lb) to have BMI = 25: 146.5   GEN: WDWN, A&Ox4,NAD. Non-toxic HEENT: Atraumatc, normocephalic. CV: RRR, No M/G/R PULM: CTA B, No wheezes, crackles, or rhonchi GU: Examination chaperoned by Benny Lennert. Normal introitus. Prominent urethra.  ABD: S, NT, ND, +BS, no rebound. No CVAT. No suprapubic tenderness. EXT: No c/c/e   Assessment and Plan:  UTI (urinary tract infection) - Plan: Urine culture  Dysuria - Plan: POCT Urinalysis Dipstick  Probable uti based on ua and history cipro  Scant yeast on slide, use diflucan  I am not sure about her bladder. Not obviously prolapsed, but this is relatively out of my normal field. I offered urology referral, but she declined.  Results for orders placed in visit on 11/17/12  POCT URINALYSIS DIPSTICK      Result Value Range   Color, UA yellow     Clarity, UA cloudy     Glucose, UA neg     Bilirubin, UA neg     Ketones, UA neg     Spec Grav, UA 1.025     Blood, UA large     pH, UA 7.0     Protein, UA 30     Urobilinogen, UA negative     Nitrite, UA neg     Leukocytes, UA large (3+)       Orders Today:  Orders Placed This Encounter  Procedures  . Urine culture  . POCT Urinalysis Dipstick    Updated Medication List: (Includes new medications, updates to list, dose adjustments) Meds ordered this encounter  Medications  . DISCONTD: ACTONEL 35 MG tablet    Sig:   . ciprofloxacin (CIPRO) 250 MG tablet    Sig: Take 1 tablet (250 mg total) by mouth 2 (two) times daily.    Dispense:  14 tablet    Refill:  0  . fluconazole (DIFLUCAN) 150 MG tablet    Sig: Take 1 tablet (150 mg total) by mouth once.    Dispense:  1 tablet    Refill:  0    Medications Discontinued: Medications Discontinued During This Encounter  Medication Reason  . ACTONEL 35 MG tablet Error      Signed, Kasi Lasky T. Ansel Ferrall, MD 11/17/2012 12:10 PM

## 2012-11-20 LAB — URINE CULTURE

## 2012-12-01 ENCOUNTER — Ambulatory Visit (INDEPENDENT_AMBULATORY_CARE_PROVIDER_SITE_OTHER): Payer: Medicare HMO | Admitting: Family Medicine

## 2012-12-01 ENCOUNTER — Encounter: Payer: Self-pay | Admitting: Family Medicine

## 2012-12-01 VITALS — BP 136/90 | HR 71 | Temp 98.1°F | Ht 64.25 in | Wt 126.5 lb

## 2012-12-01 DIAGNOSIS — W57XXXA Bitten or stung by nonvenomous insect and other nonvenomous arthropods, initial encounter: Secondary | ICD-10-CM

## 2012-12-01 MED ORDER — DOXYCYCLINE HYCLATE 100 MG PO TABS: 100.0000 mg | ORAL_TABLET | Freq: Two times a day (BID) | ORAL | Status: DC

## 2012-12-01 NOTE — Progress Notes (Signed)
Subjective:    Patient ID: Paula Jimenez, female    DOB: [birth year], 79 y.o.   MRN: [id number]  HPI Here with a tick bite  Found the tick on her R leg last weekend - got that off  (tiny tick)  Has had chigger bites also  A lot of insects around the cabin   No fever or st or rash Has a bit of a headache- ? If related - thinks that is allergy related   Patient Active Problem List   Diagnosis Date Noted  . Skin lesion of left leg 09/20/2012  . Gynecological examination 11/19/2011  . Colon cancer screening 11/19/2011  . Other screening mammogram 11/19/2011  . Campylobacter diarrhea 07/23/2011  . STRESS REACTION, ACUTE, WITH EMOTIONAL DISTURBANCE 09/02/2010  . HEARING LOSS, BILATERAL 09/23/2007  . History of melanoma 02/25/2007  . BASAL CELL CARCINOMA, NOSE 02/25/2007  . HYPERLIPIDEMIA 02/25/2007  . MITRAL VALVE PROLAPSE 02/25/2007  . ALLERGIC RHINITIS 02/25/2007  . ASTHMA 02/25/2007  . DEGENERATIVE DISC DISEASE, LUMBOSACRAL SPINE 02/25/2007  . OSTEOPOROSIS 02/25/2007  . PALPITATIONS 02/25/2007  . BASAL CELL CARCINOMA, NOSE 02/25/2007  . HYPERTENSION, BENIGN 10/26/2006   Past Medical History  Diagnosis Date  . Allergy   . Asthma   . Hyperlipidemia   . Osteoporosis   . Hypertension   . Bursitis, hip   . Osteopenia 08/01   Past Surgical History  Procedure Laterality Date  . Abdominal hysterectomy    . Breast biopsy  06/99    fibrocystic  . Orif radial fracture  11/99    arm fracture, radial no surgery   History  Substance Use Topics  . Smoking status: Never Smoker   . Smokeless tobacco: Not on file  . Alcohol Use: No   Family History  Problem Relation Age of Onset  . Leukemia Sister   . Cancer Sister     leukemia  . Heart disease Mother   . Diabetes Mother   . Stroke Father    Allergies  Allergen Reactions  . Atenolol     REACTION: can't tolerate whole pill  . Codeine   . Penicillins     REACTION: mouth swelling  . Raloxifene     REACTION: rash   . Rofecoxib   . Sulfamethoxazole W-Trimethoprim     REACTION: unknown   Current Outpatient Prescriptions on File Prior to Visit  Medication Sig Dispense Refill  . atenolol (TENORMIN) 50 MG tablet Take 1 tablet (50 mg total) by mouth daily.  30 tablet  11  . fish oil-omega-3 fatty acids 1000 MG capsule Take 1 g by mouth daily.        [name] loratadine (CLARITIN) 10 MG tablet Take 10 mg by mouth as needed.       . naproxen sodium (ANAPROX) 220 MG tablet Take 220 mg by mouth 2 (two) times daily with a meal.        . simvastatin (ZOCOR) 20 MG tablet Take 1 tablet (20 mg total) by mouth daily.  30 tablet  11  . VITAMIN D, CHOLECALCIFEROL, PO Take 1 tablet by mouth daily.        No current facility-administered medications on file prior to visit.    Review of Systems Review of Systems  Constitutional: Negative for fever, appetite change, fatigue and unexpected weight change.  Eyes: Negative for pain and visual disturbance.  Respiratory: Negative for cough and shortness of breath.   Cardiovascular: Negative for cp or palpitations    Gastrointestinal: Negative for  nausea, diarrhea and constipation.  Genitourinary: Negative for urgency and frequency.  Skin: Negative for pallor or rash  pos for insect bite  Neurological: Negative for weakness, light-headedness, numbness and headaches.  Hematological: Negative for adenopathy. Does not bruise/bleed easily.  Psychiatric/Behavioral: Negative for dysphoric mood. The patient is not nervous/anxious.         Objective:   Physical Exam  Constitutional: She appears well-developed and well-nourished. No distress.  HENT:  Head: Normocephalic and atraumatic.  Mouth/Throat: Oropharynx is clear and moist.  Eyes: Conjunctivae and EOM are normal. Pupils are equal, round, and reactive to light. Right eye exhibits no discharge. Left eye exhibits no discharge. No scleral icterus.  Neck: Normal range of motion. Neck supple.  Cardiovascular: Normal rate and  regular rhythm.   Pulmonary/Chest: Effort normal and breath sounds normal.  Abdominal: Soft. Bowel sounds are normal. She exhibits no distension. There is no tenderness.  Musculoskeletal: She exhibits no edema and no tenderness.  Lymphadenopathy:    She has no cervical adenopathy.  Neurological: She is alert. She has normal reflexes.  Skin: Skin is warm and dry. No rash noted. There is erythema. No pallor.  2 erythematous areas on back of R leg - less than 1 cm and slt raised with scabs No bullseye pattern or central clearing/ no vesicles or signs of inf or retained insect parts   Psychiatric: She has a normal mood and affect.          Assessment & Plan:

## 2012-12-01 NOTE — Patient Instructions (Addendum)
I think you have 2 tick bites that are inflamed Since you have a headache and bites are still red - we will cover you with doxycycline for tick related illness Take it twice a day with non dairy food  If any problems let me know  Keep the tick bites clean with soap and water Update if not starting to improve in a week or if worsening   Use insect repellent and clothing when outdoors

## 2012-12-02 NOTE — Assessment & Plan Note (Signed)
2 tick bites that look relatively benign without rash- but in light of recent headache will cover empirically for tick related illness with doxycline  Disc s/s tick fever/ lyme/ STARI - and pt will update if no imp or new symptoms develop

## 2013-01-21 ENCOUNTER — Encounter: Payer: Self-pay | Admitting: Family Medicine

## 2013-01-21 ENCOUNTER — Ambulatory Visit (INDEPENDENT_AMBULATORY_CARE_PROVIDER_SITE_OTHER): Payer: Medicare HMO | Admitting: Family Medicine

## 2013-01-21 VITALS — BP 142/82 | HR 70 | Temp 97.8°F | Wt 127.8 lb

## 2013-01-21 DIAGNOSIS — R350 Frequency of micturition: Secondary | ICD-10-CM

## 2013-01-21 DIAGNOSIS — N390 Urinary tract infection, site not specified: Secondary | ICD-10-CM

## 2013-01-21 LAB — POCT URINALYSIS DIPSTICK
Bilirubin, UA: NEGATIVE
Ketones, UA: NEGATIVE
Leukocytes, UA: NEGATIVE
Protein, UA: NEGATIVE
Spec Grav, UA: <=1.005
pH, UA: 6

## 2013-01-21 LAB — POCT UA - MICROSCOPIC ONLY: Yeast, UA: 0

## 2013-01-21 MED ORDER — CIPROFLOXACIN HCL 250 MG PO TABS: 250.0000 mg | ORAL_TABLET | Freq: Two times a day (BID) | ORAL | Status: DC

## 2013-01-21 NOTE — Patient Instructions (Addendum)
I think you still have a uti  Drink lots of water Take the cipro as directed for 3 days  We will update you when urine culture returns

## 2013-01-21 NOTE — Progress Notes (Signed)
Subjective:    Patient ID: Paula Jimenez, female    DOB: 09-18-1926, 79 y.o.   MRN: [id number]  HPI Here for f/u of uti  Went to [facility] Sunday for symptoms -- ? If did a cx  Given macrobid  Did not do much for her symptoms - last pill was yesterday Still having burning to urinate Has to urinate frequently and urgently at times (esp at night ) No blood in her urine   No fever  No nausea       Had seen Dr Patsy Lager before hand - end of may - and given cipro- that worked   Patient Active Problem List   Diagnosis Date Noted  . UTI (urinary tract infection) 01/21/2013  . Tick bites 12/01/2012  . Skin lesion of left leg 09/20/2012  . Gynecological examination 11/19/2011  . Colon cancer screening 11/19/2011  . Other screening mammogram 11/19/2011  . Campylobacter diarrhea 07/23/2011  . STRESS REACTION, ACUTE, WITH EMOTIONAL DISTURBANCE 09/02/2010  . HEARING LOSS, BILATERAL 09/23/2007  . History of melanoma 02/25/2007  . BASAL CELL CARCINOMA, NOSE 02/25/2007  . HYPERLIPIDEMIA 02/25/2007  . MITRAL VALVE PROLAPSE 02/25/2007  . ALLERGIC RHINITIS 02/25/2007  . ASTHMA 02/25/2007  . DEGENERATIVE DISC DISEASE, LUMBOSACRAL SPINE 02/25/2007  . OSTEOPOROSIS 02/25/2007  . PALPITATIONS 02/25/2007  . BASAL CELL CARCINOMA, NOSE 02/25/2007  . HYPERTENSION, BENIGN 10/26/2006   Past Medical History  Diagnosis Date  . Allergy   . Asthma   . Hyperlipidemia   . Osteoporosis   . Hypertension   . Bursitis, hip   . Osteopenia 08/01   Past Surgical History  Procedure Laterality Date  . Abdominal hysterectomy    . Breast biopsy  06/99    fibrocystic  . Orif radial fracture  11/99    arm fracture, radial no surgery   History  Substance Use Topics  . Smoking status: Never Smoker   . Smokeless tobacco: Not on file  . Alcohol Use: No   Family History  Problem Relation Age of Onset  . Leukemia Sister   . Cancer Sister     leukemia  . Heart disease Mother   . Diabetes Mother    . Stroke Father    Allergies  Allergen Reactions  . Atenolol     REACTION: can't tolerate whole pill  . Codeine   . Penicillins     REACTION: mouth swelling  . Raloxifene     REACTION: rash  . Rofecoxib   . Sulfamethoxazole W-Trimethoprim     REACTION: unknown   Current Outpatient Prescriptions on File Prior to Visit  Medication Sig Dispense Refill  . atenolol (TENORMIN) 50 MG tablet Take 1 tablet (50 mg total) by mouth daily.  30 tablet  11  . fish oil-omega-3 fatty acids 1000 MG capsule Take 1 g by mouth daily.        [name] loratadine (CLARITIN) 10 MG tablet Take 10 mg by mouth as needed.       . naproxen sodium (ANAPROX) 220 MG tablet Take 220 mg by mouth 2 (two) times daily with a meal.        . simvastatin (ZOCOR) 20 MG tablet Take 1 tablet (20 mg total) by mouth daily.  30 tablet  11  . VITAMIN D, CHOLECALCIFEROL, PO Take 1 tablet by mouth daily.        No current facility-administered medications on file prior to visit.    Review of Systems Review of Systems  Constitutional: Negative for  fever, appetite change, fatigue and unexpected weight change.  Eyes: Negative for pain and visual disturbance.  Respiratory: Negative for cough and shortness of breath.   Cardiovascular: Negative for cp or palpitations    Gastrointestinal: Negative for nausea, diarrhea and constipation.  Genitourinary: pos for urgency and frequency. neg for flank pain and hematuria/ pos for overactive bladder Skin: Negative for pallor or rash   Neurological: Negative for weakness, light-headedness, numbness and headaches.  Hematological: Negative for adenopathy. Does not bruise/bleed easily.  Psychiatric/Behavioral: Negative for dysphoric mood. The patient is not nervous/anxious.         Objective:   Physical Exam  Constitutional: She appears well-developed and well-nourished. No distress.  HENT:  Head: Normocephalic and atraumatic.  Eyes: Conjunctivae and EOM are normal. Pupils are equal, round,  and reactive to light.  Cardiovascular: Normal rate and regular rhythm.   Abdominal: Soft. Bowel sounds are normal. She exhibits no distension. There is tenderness in the suprapubic area. There is no rebound, no guarding and no CVA tenderness.  Neurological: She is alert.  Skin: Skin is warm and dry. No rash noted. No pallor.  Psychiatric: She has a normal mood and affect.          Assessment & Plan:

## 2013-01-23 NOTE — Assessment & Plan Note (Signed)
After tx with macrobid  ua still pos and still symptomatic  tx with cipro for 3 d cx pending  Update if not starting to improve in a week or if worsening

## 2013-01-24 LAB — URINE CULTURE

## 2013-06-21 ENCOUNTER — Ambulatory Visit: Payer: Medicare HMO | Admitting: Family Medicine

## 2013-06-22 ENCOUNTER — Ambulatory Visit (INDEPENDENT_AMBULATORY_CARE_PROVIDER_SITE_OTHER): Payer: Medicare HMO | Admitting: Family Medicine

## 2013-06-22 ENCOUNTER — Encounter: Payer: Self-pay | Admitting: Family Medicine

## 2013-06-22 VITALS — BP 128/84 | HR 70 | Temp 98.3°F | Ht 64.25 in | Wt 127.5 lb

## 2013-06-22 DIAGNOSIS — E785 Hyperlipidemia, unspecified: Secondary | ICD-10-CM

## 2013-06-22 DIAGNOSIS — E162 Hypoglycemia, unspecified: Secondary | ICD-10-CM

## 2013-06-22 DIAGNOSIS — Z23 Encounter for immunization: Secondary | ICD-10-CM

## 2013-06-22 DIAGNOSIS — I10 Essential (primary) hypertension: Secondary | ICD-10-CM

## 2013-06-22 NOTE — Patient Instructions (Addendum)
I think your respiratory virus is getting better - if cough worsens let me know (or other symptoms) If you are interested in a shingles/zoster vaccine - call your insurance to check on coverage,( you should not get it within 1 month of other vaccines) , then call us for a prescription  for it to take to a pharmacy that gives the shot , or make a nurse visit to get it here depending on your coverage  flu vaccine today  Eat smaller more frequent meals with protein  (meat/ dairy / nuts/ soy) Blood pressure is good  Follow up in 6 months for annual exam with labs prior   Hypoglycemia (Low Blood Sugar) Hypoglycemia is when the glucose (sugar) in your blood is too low. Hypoglycemia can happen for many reasons. It can happen to people with or without diabetes. Hypoglycemia can develop quickly and can be a medical emergency.  CAUSES  Having hypoglycemia does not mean that you will develop diabetes. Different causes include:  Missed or delayed meals or not enough carbohydrates eaten.  Medication overdose. This could be by accident or deliberate. If by accident, your medication may need to be adjusted or changed.  Exercise or increased activity without adjustments in carbohydrates or medications.  A nerve disorder that affects body functions like your heart rate, blood pressure and digestion (autonomic neuropathy).  A condition where the stomach muscles do not function properly (gastroparesis). Therefore, medications may not absorb properly.  The inability to recognize the signs of hypoglycemia (hypoglycemic unawareness).  Absorption of insulin  may be altered.  Alcohol consumption.  Pregnancy/menstrual cycles/postpartum. This may be due to hormones.  Certain kinds of tumors. This is very rare. SYMPTOMS   Sweating.  Hunger.  Dizziness.  Blurred vision.  Drowsiness.  Weakness.  Headache.  Rapid heart beat.  Shakiness.  Nervousness. DIAGNOSIS  Diagnosis is made by monitoring  blood glucose in one or all of the following ways:  Fingerstick blood glucose monitoring.  Laboratory results. TREATMENT  If you think your blood glucose is low:  Check your blood glucose, if possible. If it is less than 70 mg/dl, take one of the following:  3-4 glucose tablets.   cup juice (prefer clear like apple).   cup "regular" soda pop.  1 cup milk.  -1 tube of glucose gel.  5-6 hard candies.  Do not over treat because your blood glucose (sugar) will only go too high.  Wait 15 minutes and recheck your blood glucose. If it is still less than 70 mg/dl (or below your target range), repeat treatment.  Eat a snack if it is more than one hour until your next meal. Sometimes, your blood glucose may go so low that you are unable to treat yourself. You may need someone to help you. You may even pass out or be unable to swallow. This may require you to get an injection of glucagon, which raises the blood glucose. HOME CARE INSTRUCTIONS  Check blood glucose as recommended by your caregiver.  Take medication as prescribed by your caregiver.  Follow your meal plan. Do not skip meals. Eat on time.  If you are going to drink alcohol, drink it only with meals.  Check your blood glucose before driving.  Check your blood glucose before and after exercise. If you exercise longer or different than usual, be sure to check blood glucose more frequently.  Always carry treatment with you. Glucose tablets are the easiest to carry.  Always wear medical alert jewelry or  carry some form of identification that states that you have diabetes. This will alert people that you have diabetes. If you have hypoglycemia, they will have a better idea on what to do. SEEK MEDICAL CARE IF:   You are having problems keeping your blood sugar at target range.  You are having frequent episodes of hypoglycemia.  You feel you might be having side effects from your medicines.  You have symptoms of an  illness that is not improving after 3-4 days.  You notice a change in vision or a new problem with your vision. SEEK IMMEDIATE MEDICAL CARE IF:   You are a family member or friend of a person whose blood glucose goes below 70 mg/dl and is accompanied by:  Confusion.  A change in mental status.  The inability to swallow.  Passing out. Document Released: 06/09/2005 Document Revised: 09/01/2011 Document Reviewed: 10/06/2011 [facility] Patient Information 2014 [place], [place].

## 2013-06-22 NOTE — Progress Notes (Signed)
Pre-visit discussion using our clinic review tool. No additional management support is needed unless otherwise documented below in the visit note.  

## 2013-06-22 NOTE — Progress Notes (Signed)
Subjective:    Patient ID: Paula Jimenez, female    DOB: [birth year], 79 y.o.   MRN: [id number]  HPI Here for f/u of chronic health problems  Doing fairly well overall   Had a uri 2 weeks ago - has gotten better - had a bad cough  Cough is not productive  If any fever - was low grade No wheeze- had a bit at night that is better   She gets hypoglycemia if she does not eat long enough  That happens occas-she does not have a lot of appetite  Now she eats smaller more frequent meals - with protein    Wt is stable with bmi of 21   Zoster status-never had a vaccine    Mammogram - she has not gone in several years -does not want to get further ones    Flu vaccine -declines in past -she will get it today however   bp is stable today  No cp or palpitations or headaches or edema  No side effects to medicines  BP Readings from Last 3 Encounters:  06/22/13 128/84  01/21/13 142/82  12/01/12 136/90       Chemistry      Component Value Date/Time   NA 138 11/09/2012 0834   K 3.9 11/09/2012 0834   CL 104 11/09/2012 0834   CO2 27 11/09/2012 0834   BUN 13 11/09/2012 0834   CREATININE 0.6 11/09/2012 0834      Component Value Date/Time   CALCIUM 8.6 11/09/2012 0834   ALKPHOS 62 11/09/2012 0834   AST 19 11/09/2012 0834   ALT 21 11/09/2012 0834   BILITOT 0.5 11/09/2012 0834       Hyperlipidemia On zocor Lab Results  Component Value Date   CHOL 163 11/09/2012   HDL 50.30 11/09/2012   LDLCALC 93 11/09/2012   LDLDIRECT 159.5 02/19/2009   TRIG 99.0 11/09/2012   CHOLHDL 3 11/09/2012     OP- last dexa 6/13   Patient Active Problem List   Diagnosis Date Noted  . Hypoglycemia 06/22/2013  . Tick bites 12/01/2012  . Skin lesion of left leg 09/20/2012  . Gynecological examination 11/19/2011  . Colon cancer screening 11/19/2011  . Campylobacter diarrhea 07/23/2011  . HEARING LOSS, BILATERAL 09/23/2007  . History of melanoma 02/25/2007  . BASAL CELL CARCINOMA, NOSE 02/25/2007  .  HYPERLIPIDEMIA 02/25/2007  . MITRAL VALVE PROLAPSE 02/25/2007  . ALLERGIC RHINITIS 02/25/2007  . ASTHMA 02/25/2007  . DEGENERATIVE DISC DISEASE, LUMBOSACRAL SPINE 02/25/2007  . OSTEOPOROSIS 02/25/2007  . PALPITATIONS 02/25/2007  . BASAL CELL CARCINOMA, NOSE 02/25/2007  . HYPERTENSION, BENIGN 10/26/2006   Past Medical History  Diagnosis Date  . Allergy   . Asthma   . Hyperlipidemia   . Osteoporosis   . Hypertension   . Bursitis, hip   . Osteopenia 08/01   Past Surgical History  Procedure Laterality Date  . Abdominal hysterectomy    . Breast biopsy  06/99    fibrocystic  . Orif radial fracture  11/99    arm fracture, radial no surgery   History  Substance Use Topics  . Smoking status: Never Smoker   . Smokeless tobacco: Not on file  . Alcohol Use: No   Family History  Problem Relation Age of Onset  . Leukemia Sister   . Cancer Sister     leukemia  . Heart disease Mother   . Diabetes Mother   . Stroke Father    Allergies  Allergen Reactions  .  Atenolol     REACTION: can't tolerate whole pill  . Codeine   . Penicillins     REACTION: mouth swelling  . Raloxifene     REACTION: rash  . Rofecoxib   . Sulfamethoxazole-Trimethoprim     REACTION: unknown   Current Outpatient Prescriptions on File Prior to Visit  Medication Sig Dispense Refill  . atenolol (TENORMIN) 50 MG tablet Take 1 tablet (50 mg total) by mouth daily.  30 tablet  11  . fish oil-omega-3 fatty acids 1000 MG capsule Take 1 g by mouth daily.        [name] loratadine (CLARITIN) 10 MG tablet Take 10 mg by mouth as needed.       . naproxen sodium (ANAPROX) 220 MG tablet Take 220 mg by mouth 2 (two) times daily with a meal.        . simvastatin (ZOCOR) 20 MG tablet Take 1 tablet (20 mg total) by mouth daily.  30 tablet  11  . VITAMIN D, CHOLECALCIFEROL, PO Take 1 tablet by mouth daily.        No current facility-administered medications on file prior to visit.    Review of Systems Review of Systems    Constitutional: Negative for fever, appetite change, fatigue and unexpected weight change.  Eyes: Negative for pain and visual disturbance.  Respiratory: Negative for cough and shortness of breath.   Cardiovascular: Negative for cp or palpitations    Gastrointestinal: Negative for nausea, diarrhea and constipation.  Genitourinary: Negative for urgency and frequency.  Skin: Negative for pallor or rash   MSK pos for arthritis aches and pains  Neurological: Negative for weakness, light-headedness, numbness and headaches.  Hematological: Negative for adenopathy. Does not bruise/bleed easily.  Psychiatric/Behavioral: Negative for dysphoric mood. The patient is not nervous/anxious.         Objective:   Physical Exam  Constitutional: She appears well-developed and well-nourished. No distress.  HENT:  Head: Normocephalic and atraumatic.  Right Ear: External ear normal.  Left Ear: External ear normal.  Mouth/Throat: Oropharynx is clear and moist.  Eyes: Conjunctivae and EOM are normal. Pupils are equal, round, and reactive to light. No scleral icterus.  Neck: Normal range of motion. Neck supple. No JVD present. Carotid bruit is not present. No thyromegaly present.  Cardiovascular: Normal rate, regular rhythm, normal heart sounds and intact distal pulses.  Exam reveals no gallop.   Pulmonary/Chest: Effort normal and breath sounds normal. No respiratory distress. She has no wheezes. She exhibits no tenderness.  Abdominal: Soft. Bowel sounds are normal. She exhibits no distension, no abdominal bruit and no mass. There is no tenderness.  Musculoskeletal: Normal range of motion. She exhibits no edema and no tenderness.  Lymphadenopathy:    She has no cervical adenopathy.  Neurological: She is alert. She has normal reflexes. No cranial nerve deficit. She exhibits normal muscle tone. Coordination normal.  Skin: Skin is warm and dry. No rash noted. No erythema. No pallor.  Psychiatric: She has a  normal mood and affect.          Assessment & Plan:

## 2013-06-23 DIAGNOSIS — C4490 Unspecified malignant neoplasm of skin, unspecified: Secondary | ICD-10-CM

## 2013-06-23 HISTORY — DX: Unspecified malignant neoplasm of skin, unspecified: C44.90

## 2013-06-23 NOTE — Assessment & Plan Note (Signed)
Disc importance of eating small more frequent meals with protien through the day -optimally 6  Info/handout given

## 2013-06-23 NOTE — Assessment & Plan Note (Signed)
Disc goals for lipids and reasons to control them Rev labs with pt Rev low sat fat diet in detail   

## 2013-06-23 NOTE — Assessment & Plan Note (Signed)
bp in fair control at this time  No changes needed Disc lifstyle change with low sodium diet and exercise  BP: 128/84 mmHg

## 2013-10-14 ENCOUNTER — Encounter: Payer: Self-pay | Admitting: Family Medicine

## 2013-10-14 ENCOUNTER — Ambulatory Visit (INDEPENDENT_AMBULATORY_CARE_PROVIDER_SITE_OTHER): Payer: Medicare HMO | Admitting: Family Medicine

## 2013-10-14 VITALS — BP 140/78 | HR 71 | Temp 98.2°F | Ht 64.25 in | Wt 129.8 lb

## 2013-10-14 DIAGNOSIS — I10 Essential (primary) hypertension: Secondary | ICD-10-CM

## 2013-10-14 DIAGNOSIS — N390 Urinary tract infection, site not specified: Secondary | ICD-10-CM | POA: Insufficient documentation

## 2013-10-14 DIAGNOSIS — R319 Hematuria, unspecified: Secondary | ICD-10-CM

## 2013-10-14 DIAGNOSIS — T148 Other injury of unspecified body region: Secondary | ICD-10-CM

## 2013-10-14 DIAGNOSIS — E785 Hyperlipidemia, unspecified: Secondary | ICD-10-CM

## 2013-10-14 DIAGNOSIS — W57XXXA Bitten or stung by nonvenomous insect and other nonvenomous arthropods, initial encounter: Secondary | ICD-10-CM

## 2013-10-14 LAB — CBC WITH DIFFERENTIAL/PLATELET
BASOS PCT: 0.2 % (ref 0.0–3.0)
Basophils Absolute: 0 10*3/uL (ref 0.0–0.1)
EOS ABS: 0 10*3/uL (ref 0.0–0.7)
Eosinophils Relative: 0.3 % (ref 0.0–5.0)
HEMATOCRIT: 40.7 % (ref 36.0–46.0)
HEMOGLOBIN: 13.4 g/dL (ref 12.0–15.0)
Lymphocytes Relative: 14.6 % (ref 12.0–46.0)
Lymphs Abs: 1.7 10*3/uL (ref 0.7–4.0)
MCHC: 32.9 g/dL (ref 30.0–36.0)
MCV: 95.2 fl (ref 78.0–100.0)
MONO ABS: 0.7 10*3/uL (ref 0.1–1.0)
Monocytes Relative: 6 % (ref 3.0–12.0)
NEUTROS ABS: 9.1 10*3/uL — AB (ref 1.4–7.7)
Neutrophils Relative %: 78.9 % — ABNORMAL HIGH (ref 43.0–77.0)
Platelets: 195 10*3/uL (ref 150.0–400.0)
RBC: 4.27 Mil/uL (ref 3.87–5.11)
RDW: 13.7 % (ref 11.5–14.6)
WBC: 11.5 10*3/uL — ABNORMAL HIGH (ref 4.5–10.5)

## 2013-10-14 LAB — POCT URINALYSIS DIPSTICK
Bilirubin, UA: NEGATIVE
Glucose, UA: NEGATIVE
Ketones, UA: NEGATIVE
Nitrite, UA: POSITIVE
PH UA: 6.5
SPEC GRAV UA: 1.015
UROBILINOGEN UA: 0.2

## 2013-10-14 MED ORDER — CIPROFLOXACIN HCL 250 MG PO TABS: 250.0000 mg | ORAL_TABLET | Freq: Two times a day (BID) | ORAL | Status: DC

## 2013-10-14 MED ORDER — SIMVASTATIN 20 MG PO TABS: 20.0000 mg | ORAL_TABLET | Freq: Every day | ORAL | Status: DC

## 2013-10-14 MED ORDER — CLOBETASOL PROPIONATE 0.05 % EX CREA: 1.0000 "application " | TOPICAL_CREAM | Freq: Two times a day (BID) | CUTANEOUS | Status: DC

## 2013-10-14 MED ORDER — ATENOLOL 50 MG PO TABS: 50.0000 mg | ORAL_TABLET | Freq: Every day | ORAL | Status: DC

## 2013-10-14 NOTE — Progress Notes (Signed)
Pre visit review using our clinic review tool, if applicable. No additional management support is needed unless otherwise documented below in the visit note. 

## 2013-10-14 NOTE — Patient Instructions (Signed)
For itching from tick bites try zyrtec 10 mg daily otc and also use temovate cream on bites If worse or fever or other symptoms let me know Use insect repellent to deter ticks Take cipro for uti-we will call you about urine culture Labs today  If you are interested in a shingles/zoster vaccine - call your insurance to check on coverage,( you should not get it within 1 month of other vaccines) , then call us for a prescription  for it to take to a pharmacy that gives the shot , or make a nurse visit to get it here depending on your coverage

## 2013-10-14 NOTE — Progress Notes (Signed)
Subjective:    Patient ID: Paula Jimenez, female    DOB: 1927/04/10, 80 y.o.   MRN: [id number]  HPI Here with blood in urine - she noticed it yesterday Urine dips pos for a uti  (last one was in august)  She has dysuria and frequency  Low back pain-not flank (has been working outdoors ) No fever or vomiting  Results for orders placed in visit on 10/14/13  POCT URINALYSIS DIPSTICK      Result Value Ref Range   Color, UA amber     Clarity, UA cloudy     Glucose, UA neg     Bilirubin, UA neg     Ketones, UA neg     Spec Grav, UA 1.015     Blood, UA Large     pH, UA 6.5     Protein, UA 30+     Urobilinogen, UA 0.2     Nitrite, UA Positive     Leukocytes, UA moderate (2+)      Has had multiple tick bites They itch No rash  She got them all off- (within several days she thinks) They were tiny- suspects deer ticks  She takes claritin over the counter  No joint pain or fever or other symptoms  Temovate cream helps   Patient Active Problem List   Diagnosis Date Noted  . UTI (urinary tract infection) 10/14/2013  . Hypoglycemia 06/22/2013  . Tick bites 12/01/2012  . Skin lesion of left leg 09/20/2012  . Gynecological examination 11/19/2011  . Colon cancer screening 11/19/2011  . Campylobacter diarrhea 07/23/2011  . HEARING LOSS, BILATERAL 09/23/2007  . History of melanoma 02/25/2007  . BASAL CELL CARCINOMA, NOSE 02/25/2007  . HYPERLIPIDEMIA 02/25/2007  . MITRAL VALVE PROLAPSE 02/25/2007  . ALLERGIC RHINITIS 02/25/2007  . ASTHMA 02/25/2007  . DEGENERATIVE DISC DISEASE, LUMBOSACRAL SPINE 02/25/2007  . OSTEOPOROSIS 02/25/2007  . PALPITATIONS 02/25/2007  . BASAL CELL CARCINOMA, NOSE 02/25/2007  . HYPERTENSION, BENIGN 10/26/2006   Past Medical History  Diagnosis Date  . Allergy   . Asthma   . Hyperlipidemia   . Osteoporosis   . Hypertension   . Bursitis, hip   . Osteopenia 08/01   Past Surgical History  Procedure Laterality Date  . Abdominal hysterectomy      . Breast biopsy  06/99    fibrocystic  . Orif radial fracture  11/99    arm fracture, radial no surgery   History  Substance Use Topics  . Smoking status: Never Smoker   . Smokeless tobacco: Not on file  . Alcohol Use: No   Family History  Problem Relation Age of Onset  . Leukemia Sister   . Cancer Sister     leukemia  . Heart disease Mother   . Diabetes Mother   . Stroke Father    Allergies  Allergen Reactions  . Atenolol     REACTION: can't tolerate whole pill  . Codeine   . Penicillins     REACTION: mouth swelling  . Raloxifene     REACTION: rash  . Rofecoxib   . Sulfamethoxazole-Trimethoprim     REACTION: unknown   Current Outpatient Prescriptions on File Prior to Visit  Medication Sig Dispense Refill  . fish oil-omega-3 fatty acids 1000 MG capsule Take 1 g by mouth daily.        [name] loratadine (CLARITIN) 10 MG tablet Take 10 mg by mouth as needed.       . naproxen sodium (ANAPROX) 220  MG tablet Take 220 mg by mouth 2 (two) times daily with a meal.        . VITAMIN D, CHOLECALCIFEROL, PO Take 1 tablet by mouth daily.        No current facility-administered medications on file prior to visit.    Review of Systems    Review of Systems  Constitutional: Negative for fever, appetite change, fatigue and unexpected weight change.  Eyes: Negative for pain and visual disturbance.  Respiratory: Negative for cough and shortness of breath.   Cardiovascular: Negative for cp or palpitations    Gastrointestinal: Negative for nausea, diarrhea and constipation.  Genitourinary: pos for urgency and frequency.  Skin: Negative for pallor or rash  pos for tick bites that itch Neurological: Negative for weakness, light-headedness, numbness and headaches.  Hematological: Negative for adenopathy. Does not bruise/bleed easily.  Psychiatric/Behavioral: Negative for dysphoric mood. The patient is not nervous/anxious.      Objective:   Physical Exam  Constitutional: She appears  well-developed and well-nourished. No distress.  HENT:  Head: Normocephalic and atraumatic.  Mouth/Throat: Oropharynx is clear and moist.  Eyes: Conjunctivae and EOM are normal. Pupils are equal, round, and reactive to light.  Cardiovascular: Normal rate and regular rhythm.   Pulmonary/Chest: Effort normal and breath sounds normal.  Musculoskeletal: She exhibits no edema.  No acute joint changes   Neurological: She is alert. She has normal reflexes. No cranial nerve deficit. She exhibits normal muscle tone. Coordination normal.  Skin: Skin is warm and dry.  Tick bites on abd/leg and arm- whelps less than 1 cm with erythema - no central clearing or bullseye  No rash  Psychiatric: She has a normal mood and affect.          Assessment & Plan:

## 2013-10-15 NOTE — Assessment & Plan Note (Signed)
No s/s of tick bourne illness- disc what to watch for  Temovate cream prn  Zyrtec for itch Update if not starting to improve in a week or if worsening

## 2013-10-15 NOTE — Assessment & Plan Note (Signed)
Lipid panel today

## 2013-10-15 NOTE — Assessment & Plan Note (Signed)
Lab today.

## 2013-10-15 NOTE — Assessment & Plan Note (Signed)
Cover with cipro  Fluids  cx pending  Update if not starting to improve in a week or if worsening

## 2013-10-17 ENCOUNTER — Telehealth: Payer: Self-pay | Admitting: Family Medicine

## 2013-10-17 LAB — COMPREHENSIVE METABOLIC PANEL
ALK PHOS: 67 U/L (ref 39–117)
ALT: 22 U/L (ref 0–35)
AST: 29 U/L (ref 0–37)
Albumin: 3.8 g/dL (ref 3.5–5.2)
BUN: 12 mg/dL (ref 6–23)
CALCIUM: 9.1 mg/dL (ref 8.4–10.5)
CO2: 28 mEq/L (ref 19–32)
CREATININE: 0.6 mg/dL (ref 0.4–1.2)
Chloride: 99 mEq/L (ref 96–112)
GFR: 102.97 mL/min (ref >60.00)
Glucose, Bld: 137 mg/dL — ABNORMAL HIGH (ref 70–99)
Potassium: 3.8 mEq/L (ref 3.5–5.1)
Sodium: 138 mEq/L (ref 135–145)
Total Bilirubin: 0.5 mg/dL (ref 0.3–1.2)
Total Protein: 6.8 g/dL (ref 6.0–8.3)

## 2013-10-17 LAB — LIPID PANEL
CHOL/HDL RATIO: 3
CHOLESTEROL: 157 mg/dL (ref 0–200)
HDL: 58.2 mg/dL (ref >39.00)
LDL CALC: 70 mg/dL (ref 0–99)
TRIGLYCERIDES: 143 mg/dL (ref 0.0–149.0)
VLDL: 28.6 mg/dL (ref 0.0–40.0)

## 2013-10-17 LAB — TSH: TSH: 1.05 u[IU]/mL (ref 0.35–5.50)

## 2013-10-17 NOTE — Telephone Encounter (Signed)
Relevant patient education mailed to patient.  

## 2013-10-18 ENCOUNTER — Telehealth: Payer: Self-pay

## 2013-10-18 LAB — URINE CULTURE: Colony Count: >=100000

## 2013-10-18 NOTE — Telephone Encounter (Signed)
Pt said since stopping the cipro he hasn't had anymore diarrhea and she feels better, pt advise not to restart the cipro and voiced understanding

## 2013-10-18 NOTE — Telephone Encounter (Signed)
Paula Jimenez with [facility]  Said pt woke 5:30 AM with diarrhea and lips mildly swollen; lip swelling was gone after 10-15 mins. No difficulty breathing, SOB or wheezing. No other allergic symptoms. Pt has had diarrhea x 5 since 5:30 AM. Dr Glori Bickers said to stop Cipro, take Benadryl if needed for itching and pt will receive call from Dr Marliss Coots asst. If pt condition were to change or worsen prior to cb pt will call office or if trouble breathing will go to [facility]. Paula Jimenez voiced understanding and will advise pt.

## 2013-10-18 NOTE — Telephone Encounter (Signed)
Please check and see how her symptoms are the afternoon and let me know - inst her to remain off cipro-I am not going to change her to another abx yet

## 2013-10-18 NOTE — Telephone Encounter (Signed)
Patient Information:  Caller Name: Donise  Phone: [phone]  Patient: Paula Jimenez, Paula Jimenez  Gender: Female  DOB: 07/06/1926  Age: 80 Years  PCP: Loura Pardon [facility])  Office Follow Up:  Does the office need to follow up with this patient?: Yes  Instructions For The Office: as promised  RN Note:  Pt given labs results per Epic.  Pt reports that she can get to office to be seen.  Office Lawyer given pt report.  Dr Glori Bickers consulted.  OK to stop CIpro take Benadryl PRN itching.  Office will call back with follow up.  Any sxs develop call back.  Symptoms  Reason For Call & Symptoms: Pt is currently taking Cipro for a UTI.  Cipro started on 4/24.  Pt states that she has been on Cipro multiple times.  She states that she has not ever had any problems with med.  Diarrhea onset 05:30am 4/28.  Diarrhea x 5.  No nausea, Afebrile, no rash.  Pt reports that she was itching generalized last pm and sensation of mouth swelling.  Pt states that she looked at lip and they were swollen mildly and then "within a few minutes it was gone".  Pt has NOT taken am dose.  She is concerned about reaction sxs.  No abd pain.  Stools are normal color.  Reviewed Health History In EMR: Yes  Reviewed Medications In EMR: Yes  Reviewed Allergies In EMR: Yes  Reviewed Surgeries / Procedures: Yes  Date of Onset of Symptoms: 10/18/2013  Guideline(s) Used:  Diarrhea  Disposition Per Guideline:   Go to Office Now  Reason For Disposition Reached:   Age > 60 years and has had > 6 diarrhea stools in past 24 hours  Advice Given:  N/A  RN Overrode Recommendation:  Follow Up With Office Later  Office contacted and Dr Glori Bickers will follow up with pt.  Pt given instructions per MD and states that she understands

## 2013-10-18 NOTE — Telephone Encounter (Signed)
Ask her to call us tomorrow and let us know how she is feeling - then I can try a different antibiotic if needed

## 2013-10-18 NOTE — Telephone Encounter (Signed)
Pt notified to update [place] of sxs tomorrow and if she is still feeling okay we can prescribed a new abx if needed, pt verbalized understanding

## 2013-10-19 NOTE — Telephone Encounter (Signed)
I looked at the px and it looks like she got 4 d of the cipro -if her urinary symptoms are gone - then we can probably stop there -if she still has symptoms let me know and I will send a new px to pharmacy Please list cipro on her allergy list for diarrhea-thanks

## 2013-10-19 NOTE — Telephone Encounter (Signed)
Pt said she is doing fine today; pt wants to know if new antibiotic will be sent to [facility]. Pt request cb.

## 2013-10-19 NOTE — Telephone Encounter (Signed)
Pt said that she has increased her water intake and now feels back to normal. She said as of right now she doesn't need the abx sent to the pharmacy. Pt doesn't have any abd pain or problems and since increasing her water intake she hasn't had any problems with her urine flow, pt said if her sxs return she will call us back  Rx not sent to pharmacy

## 2013-10-19 NOTE — Telephone Encounter (Signed)
That sounds fine

## 2013-10-19 NOTE — Telephone Encounter (Signed)
Ok-please call in macrobid 100 mg 1 po bid for 5 d #10 no ref  F/u if not improved

## 2013-10-19 NOTE — Telephone Encounter (Signed)
Pt said she doesn't have any dysuria anymore but she does have a "weird feeling" in her lower abd when she uses the bathroom. Pt said she is also having a small problem with her urine flow, it's not as much as it use to be. So she isn't sure if she needs a new abx or not, please advise

## 2013-11-29 ENCOUNTER — Other Ambulatory Visit: Payer: Self-pay | Admitting: Family Medicine

## 2014-01-06 ENCOUNTER — Encounter: Payer: Self-pay | Admitting: Family Medicine

## 2014-01-06 ENCOUNTER — Ambulatory Visit (INDEPENDENT_AMBULATORY_CARE_PROVIDER_SITE_OTHER): Payer: Medicare HMO | Admitting: Family Medicine

## 2014-01-06 VITALS — BP 140/86 | HR 74 | Temp 97.5°F | Ht 64.25 in

## 2014-01-06 DIAGNOSIS — R197 Diarrhea, unspecified: Secondary | ICD-10-CM | POA: Insufficient documentation

## 2014-01-06 DIAGNOSIS — J018 Other acute sinusitis: Secondary | ICD-10-CM

## 2014-01-06 DIAGNOSIS — R42 Dizziness and giddiness: Secondary | ICD-10-CM

## 2014-01-06 DIAGNOSIS — J019 Acute sinusitis, unspecified: Secondary | ICD-10-CM | POA: Insufficient documentation

## 2014-01-06 DIAGNOSIS — S90121A Contusion of right lesser toe(s) without damage to nail, initial encounter: Secondary | ICD-10-CM

## 2014-01-06 DIAGNOSIS — S90129A Contusion of unspecified lesser toe(s) without damage to nail, initial encounter: Secondary | ICD-10-CM | POA: Insufficient documentation

## 2014-01-06 MED ORDER — AZITHROMYCIN 250 MG PO TABS: ORAL_TABLET | ORAL | Status: DC

## 2014-01-06 NOTE — Progress Notes (Signed)
Subjective:    Patient ID: Paula Jimenez, female    DOB: Oct 28, 1926, 80 y.o.   MRN: [id number]  HPI Here for dizziness and some other symptoms   Had a bit of congestion in chest last week with a cough - and that is improved now   Monday am - had a bad coughing fit  Then got pressure in her head (this has happened before)- that lasted 10 minutes  Then she got dizzy "swimmy headed" ever since  Hard to concentrate or focus  No facial droop or weakness , speech is fine   Just not felt right since then   Diarrhea started this am  Ate some ramen noodles last night- too much spice - thinks that is the reason Had an accident this am fecal incontinence -had to change clothes  No abd pain or other symptoms   Also has a toe that bothers her on right foot - she injured it at home -was briefly barefoot- and stubbed her 5th toe pretty badly   3 weeks ago- black and blue and a bit swollen  A little pain but not bad   Patient Active Problem List   Diagnosis Date Noted  . UTI (urinary tract infection) 10/14/2013  . Hypoglycemia 06/22/2013  . Tick bites 12/01/2012  . Skin lesion of left leg 09/20/2012  . Gynecological examination 11/19/2011  . Colon cancer screening 11/19/2011  . Campylobacter diarrhea 07/23/2011  . HEARING LOSS, BILATERAL 09/23/2007  . History of melanoma 02/25/2007  . BASAL CELL CARCINOMA, NOSE 02/25/2007  . HYPERLIPIDEMIA 02/25/2007  . MITRAL VALVE PROLAPSE 02/25/2007  . ALLERGIC RHINITIS 02/25/2007  . ASTHMA 02/25/2007  . DEGENERATIVE DISC DISEASE, LUMBOSACRAL SPINE 02/25/2007  . OSTEOPOROSIS 02/25/2007  . PALPITATIONS 02/25/2007  . BASAL CELL CARCINOMA, NOSE 02/25/2007  . HYPERTENSION, BENIGN 10/26/2006   Past Medical History  Diagnosis Date  . Allergy   . Asthma   . Hyperlipidemia   . Osteoporosis   . Hypertension   . Bursitis, hip   . Osteopenia 08/01   Past Surgical History  Procedure Laterality Date  . Abdominal hysterectomy    . Breast biopsy   06/99    fibrocystic  . Orif radial fracture  11/99    arm fracture, radial no surgery   History  Substance Use Topics  . Smoking status: Never Smoker   . Smokeless tobacco: Not on file  . Alcohol Use: No   Family History  Problem Relation Age of Onset  . Leukemia Sister   . Cancer Sister     leukemia  . Heart disease Mother   . Diabetes Mother   . Stroke Father    Allergies  Allergen Reactions  . Atenolol     REACTION: can't tolerate whole pill  . Ciprofloxacin     diarrhea  . Codeine   . Penicillins     REACTION: mouth swelling  . Raloxifene     REACTION: rash  . Rofecoxib   . Sulfamethoxazole-Trimethoprim     REACTION: unknown   Current Outpatient Prescriptions on File Prior to Visit  Medication Sig Dispense Refill  . atenolol (TENORMIN) 50 MG tablet Take 1 tablet (50 mg total) by mouth daily.  30 tablet  11  . clobetasol cream (TEMOVATE) 1.75 % Apply 1 application topically 2 (two) times daily. Apply to affected area twice daily if needed  30 g  5  . fish oil-omega-3 fatty acids 1000 MG capsule Take 1 g by mouth daily.        [name]  loratadine (CLARITIN) 10 MG tablet Take 10 mg by mouth as needed.       . naproxen sodium (ANAPROX) 220 MG tablet Take 220 mg by mouth 2 (two) times daily with a meal.        . simvastatin (ZOCOR) 20 MG tablet Take 1 tablet (20 mg total) by mouth daily.  30 tablet  11  . VITAMIN D, CHOLECALCIFEROL, PO Take 1 tablet by mouth daily.        No current facility-administered medications on file prior to visit.    Review of Systems Review of Systems  Constitutional: Negative for fever, appetite change,  and unexpected weight change.  ENT pos for sinus pressure/ dizziness/ ear fulness  Eyes: Negative for pain and visual disturbance.  Respiratory: Negative for cough and shortness of breath.   Cardiovascular: Negative for cp or palpitations    Gastrointestinal: Negative for nausea, diarrhea and constipation.  Genitourinary: Negative for  urgency and frequency.  Skin: Negative for pallor or rash   MSK pos for pain in toe  Neurological: Negative for weakness, light-headedness, numbness and headaches.  Hematological: Negative for adenopathy. Does not bruise/bleed easily.  Psychiatric/Behavioral: Negative for dysphoric mood. The patient is not nervous/anxious.         Objective:   Physical Exam  Constitutional: She is oriented to person, place, and time. She appears well-developed and well-nourished.  HENT:  Head: Normocephalic and atraumatic.  Right Ear: External ear normal.  Left Ear: External ear normal.  Mouth/Throat: Oropharynx is clear and moist. No oropharyngeal exudate.  Nares are injected and congested  (mild/ clear rhinorrhea) Bilateral maxillary and frontal sinus tenderness  TMs clear   Eyes: Conjunctivae and EOM are normal. Pupils are equal, round, and reactive to light. Right eye exhibits no discharge. Left eye exhibits no discharge. No scleral icterus.  2-3 beats of lateral nystagmus   Neck: Normal range of motion. Neck supple. No JVD present. Carotid bruit is not present.  Cardiovascular: Normal rate and regular rhythm.   Pulmonary/Chest: Effort normal and breath sounds normal. No respiratory distress. She has no wheezes. She has no rales.  Abdominal: Soft. Bowel sounds are normal. She exhibits no distension and no mass. There is no tenderness. There is no rebound and no guarding.  Musculoskeletal: She exhibits no edema.  Right 5th toe is mildly swollen / nontender Nl rom  No crepitus  Lymphadenopathy:    She has no cervical adenopathy.  Neurological: She is alert and oriented to person, place, and time. She has normal strength and normal reflexes. She displays no atrophy. No cranial nerve deficit or sensory deficit. She exhibits normal muscle tone. Coordination and gait normal.  Nl speech Nl gait  No facial droop   Skin: Skin is warm and dry. No rash noted. No erythema. No pallor.  Psychiatric: She  has a normal mood and affect.          Assessment & Plan:   Problem List Items Addressed This Visit     Respiratory   Acute sinusitis - Primary     Sinus pressure and dizziness and cough after viral uri Cover with zpak Update  Consider imaging study of head if no imp     Relevant Medications      azithromycin (ZITHROMAX) tablet     Other   Diarrhea     One episode today Pt thinks it was from eating ramen noodles last night Feels fine-will update if symptoms persist or worsen    Dizziness  With facial pressure-suspect sinusitis-will tx and update    Toe contusion     R 5th toe Trauma 3 wk ago Getting better-reassuring exam Disc buddy taping or stockings and supp shoes Will update

## 2014-01-06 NOTE — Assessment & Plan Note (Signed)
One episode today Pt thinks it was from eating ramen noodles last night Feels fine-will update if symptoms persist or worsen

## 2014-01-06 NOTE — Patient Instructions (Signed)
I think you may have a sinus infection -this can cause some dizziness Take the zithromax as directed  I will send you for a hearing eval with ENT in the future if you want  I think you bruised your toe- support it well with good shoes- if symptoms worsen let me know If the headache and dizziness do not improve- I may want to check an imaging study so let me know

## 2014-01-06 NOTE — Assessment & Plan Note (Signed)
With facial pressure-suspect sinusitis-will tx and update

## 2014-01-06 NOTE — Assessment & Plan Note (Signed)
R 5th toe Trauma 3 wk ago Getting better-reassuring exam Disc buddy taping or stockings and supp shoes Will update

## 2014-01-06 NOTE — Assessment & Plan Note (Signed)
Sinus pressure and dizziness and cough after viral uri Cover with zpak Update  Consider imaging study of head if no imp

## 2014-02-20 ENCOUNTER — Ambulatory Visit (INDEPENDENT_AMBULATORY_CARE_PROVIDER_SITE_OTHER): Payer: Commercial Managed Care - HMO | Admitting: Family Medicine

## 2014-02-20 ENCOUNTER — Encounter: Payer: Self-pay | Admitting: Family Medicine

## 2014-02-20 VITALS — BP 148/92 | HR 65 | Temp 98.3°F | Ht 64.25 in | Wt 127.0 lb

## 2014-02-20 DIAGNOSIS — Z8619 Personal history of other infectious and parasitic diseases: Secondary | ICD-10-CM | POA: Insufficient documentation

## 2014-02-20 DIAGNOSIS — B029 Zoster without complications: Secondary | ICD-10-CM

## 2014-02-20 MED ORDER — VALACYCLOVIR HCL 1 G PO TABS: 1000.0000 mg | ORAL_TABLET | Freq: Three times a day (TID) | ORAL | Status: DC

## 2014-02-20 MED ORDER — TRAMADOL HCL 50 MG PO TABS: 50.0000 mg | ORAL_TABLET | Freq: Three times a day (TID) | ORAL | Status: DC | PRN

## 2014-02-20 NOTE — Progress Notes (Signed)
Pre visit review using our clinic review tool, if applicable. No additional management support is needed unless otherwise documented below in the visit note. 

## 2014-02-20 NOTE — Patient Instructions (Signed)
For shingles-take the valtrex -finish it all  For the pain - take tramadol with caution (for sedation)  Don't put anything on rash - just keep it clean  Rash may or may not spread before it gets better  Follow up with me in 7-10 days  Avoid pressure on the rash    Shingles Shingles (herpes zoster) is an infection that is caused by the same virus that causes chickenpox (varicella). The infection causes a painful skin rash and fluid-filled blisters, which eventually break open, crust over, and heal. It may occur in any area of the body, but it usually affects only one side of the body or face. The pain of shingles usually lasts about 1 month. However, some people with shingles may develop long-term (chronic) pain in the affected area of the body. Shingles often occurs many years after the person had chickenpox. It is more common:  In people older than 50 years.  In people with weakened immune systems, such as those with HIV, AIDS, or cancer.  In people taking medicines that weaken the immune system, such as transplant medicines.  In people under great stress. CAUSES  Shingles is caused by the varicella zoster virus (VZV), which also causes chickenpox. After a person is infected with the virus, it can remain in the person's body for years in an inactive state (dormant). To cause shingles, the virus reactivates and breaks out as an infection in a nerve root. The virus can be spread from person to person (contagious) through contact with open blisters of the shingles rash. It will only spread to people who have not had chickenpox. When these people are exposed to the virus, they may develop chickenpox. They will not develop shingles. Once the blisters scab over, the person is no longer contagious and cannot spread the virus to others. SIGNS AND SYMPTOMS  Shingles shows up in stages. The initial symptoms may be pain, itching, and tingling in an area of the skin. This pain is usually described as  burning, stabbing, or throbbing.In a few days or weeks, a painful red rash will appear in the area where the pain, itching, and tingling were felt. The rash is usually on one side of the body in a band or belt-like pattern. Then, the rash usually turns into fluid-filled blisters. They will scab over and dry up in approximately 2-3 weeks. Flu-like symptoms may also occur with the initial symptoms, the rash, or the blisters. These may include:  Fever.  Chills.  Headache.  Upset stomach. DIAGNOSIS  Your health care provider will perform a skin exam to diagnose shingles. Skin scrapings or fluid samples may also be taken from the blisters. This sample will be examined under a microscope or sent to a lab for further testing. TREATMENT  There is no specific cure for shingles. Your health care provider will likely prescribe medicines to help you manage the pain, recover faster, and avoid long-term problems. This may include antiviral drugs, anti-inflammatory drugs, and pain medicines. HOME CARE INSTRUCTIONS   Take a cool bath or apply cool compresses to the area of the rash or blisters as directed. This may help with the pain and itching.   Take medicines only as directed by your health care provider.   Rest as directed by your health care provider.  Keep your rash and blisters clean with mild soap and cool water or as directed by your health care provider.  Do not pick your blisters or scratch your rash. Apply an anti-itch  cream or numbing creams to the affected area as directed by your health care provider.  Keep your shingles rash covered with a loose bandage (dressing).  Avoid skin contact with:  Babies.   Pregnant women.   Children with eczema.   Elderly people with transplants.   People with chronic illnesses, such as leukemia or AIDS.   Wear loose-fitting clothing to help ease the pain of material rubbing against the rash.  Keep all follow-up visits as directed by  your health care provider.If the area involved is on your face, you may receive a referral for a specialist, such as an eye doctor (ophthalmologist) or an ear, nose, and throat (ENT) doctor. Keeping all follow-up visits will help you avoid eye problems, chronic pain, or disability.  SEEK IMMEDIATE MEDICAL CARE IF:   You have facial pain, pain around the eye area, or loss of feeling on one side of your face.  You have ear pain or ringing in your ear.  You have loss of taste.  Your pain is not relieved with prescribed medicines.   Your redness or swelling spreads.   You have more pain and swelling.  Your condition is worsening or has changed.   You have a fever. MAKE SURE YOU:  Understand these instructions.  Will watch your condition.  Will get help right away if you are not doing well or get worse. Document Released: 06/09/2005 Document Revised: 10/24/2013 Document Reviewed: 01/22/2012 [facility] Patient Information 2015 [place], [place]. This information is not intended to replace advice given to you by your health care provider. Make sure you discuss any questions you have with your health care provider.

## 2014-02-20 NOTE — Assessment & Plan Note (Addendum)
Rash and pain in R T5 distribution  Valtrex 1 g tid for 7d Tramadol for pian with caution  Handout given  F/u 7-10 d /update earlier if necessary

## 2014-02-20 NOTE — Progress Notes (Signed)
Subjective:    Patient ID: Paula Jimenez, female    DOB: [birth year], 80 y.o.   MRN: [id number]  HPI \\here  with back pain for 1 1/2 weeks  Got bad over the weekend  Started in her R shoulder blade and then rad down her side and then across her upper abdomen  ? Wondered if pleurisy , now no longer hurts to take a deep breath  She took a goody powder middle of the night   Hurts most to bend to the right   Used a hot /moist compress on it, and then tried ice    This all started with lifting a bag with her R hand and then she weeded  Does have an itchy area under her R breast -did not look for a rash- that started yesterday   Patient Active Problem List   Diagnosis Date Noted  . Acute sinusitis 01/06/2014  . Diarrhea 01/06/2014  . Dizziness 01/06/2014  . Toe contusion 01/06/2014  . UTI (urinary tract infection) 10/14/2013  . Hypoglycemia 06/22/2013  . Tick bites 12/01/2012  . Skin lesion of left leg 09/20/2012  . Gynecological examination 11/19/2011  . Colon cancer screening 11/19/2011  . Campylobacter diarrhea 07/23/2011  . HEARING LOSS, BILATERAL 09/23/2007  . History of melanoma 02/25/2007  . BASAL CELL CARCINOMA, NOSE 02/25/2007  . HYPERLIPIDEMIA 02/25/2007  . MITRAL VALVE PROLAPSE 02/25/2007  . ALLERGIC RHINITIS 02/25/2007  . ASTHMA 02/25/2007  . DEGENERATIVE DISC DISEASE, LUMBOSACRAL SPINE 02/25/2007  . OSTEOPOROSIS 02/25/2007  . PALPITATIONS 02/25/2007  . BASAL CELL CARCINOMA, NOSE 02/25/2007  . HYPERTENSION, BENIGN 10/26/2006   Past Medical History  Diagnosis Date  . Allergy   . Asthma   . Hyperlipidemia   . Osteoporosis   . Hypertension   . Bursitis, hip   . Osteopenia 08/01   Past Surgical History  Procedure Laterality Date  . Abdominal hysterectomy    . Breast biopsy  06/99    fibrocystic  . Orif radial fracture  11/99    arm fracture, radial no surgery   History  Substance Use Topics  . Smoking status: Never Smoker   . Smokeless tobacco:  Not on file  . Alcohol Use: No   Family History  Problem Relation Age of Onset  . Leukemia Sister   . Cancer Sister     leukemia  . Heart disease Mother   . Diabetes Mother   . Stroke Father    Allergies  Allergen Reactions  . Ciprofloxacin     diarrhea  . Codeine   . Penicillins     REACTION: mouth swelling  . Raloxifene     REACTION: rash  . Rofecoxib   . Sulfamethoxazole-Trimethoprim     REACTION: unknown   Current Outpatient Prescriptions on File Prior to Visit  Medication Sig Dispense Refill  . atenolol (TENORMIN) 50 MG tablet Take 1 tablet (50 mg total) by mouth daily.  30 tablet  11  . clobetasol cream (TEMOVATE) 6.30 % Apply 1 application topically 2 (two) times daily. Apply to affected area twice daily if needed  30 g  5  . loratadine (CLARITIN) 10 MG tablet Take 10 mg by mouth as needed.       . simvastatin (ZOCOR) 20 MG tablet Take 1 tablet (20 mg total) by mouth daily.  30 tablet  11  . VITAMIN D, CHOLECALCIFEROL, PO Take 1 tablet by mouth daily.       . fish oil-omega-3 fatty acids 1000 MG capsule  Take 1 g by mouth daily.         No current facility-administered medications on file prior to visit.    Review of Systems Review of Systems  Constitutional: Negative for fever, appetite change, fatigue and unexpected weight change.  Eyes: Negative for pain and visual disturbance.  Respiratory: Negative for cough and shortness of breath.   Cardiovascular: Negative for cp or palpitations    Gastrointestinal: Negative for nausea, diarrhea and constipation.  Genitourinary: Negative for urgency and frequency. pos for pain under R breast  Skin: Negative for pallor or rash   MSK pos for back and rib pain  Neurological: Negative for weakness, light-headedness, numbness and headaches.  Hematological: Negative for adenopathy. Does not bruise/bleed easily.  Psychiatric/Behavioral: Negative for dysphoric mood. The patient is not nervous/anxious.         Objective:    Physical Exam  Constitutional: She appears well-developed and well-nourished. No distress.  HENT:  Head: Normocephalic and atraumatic.  Mouth/Throat: Oropharynx is clear and moist.  Eyes: Conjunctivae and EOM are normal. Pupils are equal, round, and reactive to light. Right eye exhibits no discharge. Left eye exhibits no discharge. No scleral icterus.  Neck: Normal range of motion. Neck supple. Carotid bruit is not present.  Cardiovascular: Normal rate and regular rhythm.   Pulmonary/Chest: Effort normal and breath sounds normal. No respiratory distress. She has no wheezes. She has no rales.  Abdominal: Soft. Bowel sounds are normal. She exhibits no distension. There is tenderness. There is no rebound and no guarding.  Tender in R upper abdomen-mild  No rebound or guarding   Musculoskeletal: She exhibits tenderness. She exhibits no edema.  Tender over R T5 area of back   Lymphadenopathy:    She has no cervical adenopathy.  Neurological: She is alert. She has normal reflexes.  Skin: Skin is warm and dry. Rash noted. There is erythema.  Clusters of erythematous vesicles under R breast   Psychiatric: She has a normal mood and affect.          Assessment & Plan:   Problem List Items Addressed This Visit     Other   Herpes zoster - Primary     Rash and pain in R T5 distribution  Valtrex 1 g tid for 7d Tramadol for pian with caution  Handout given  F/u 7-10 d /update earlier if necessary    Relevant Medications      valACYclovir (VALTREX) tablet

## 2014-02-24 ENCOUNTER — Telehealth: Payer: Self-pay | Admitting: Family Medicine

## 2014-02-24 NOTE — Telephone Encounter (Signed)
The tramadol or the valtrex?

## 2014-02-24 NOTE — Telephone Encounter (Signed)
Pt daughter [name] called and said medication for shingles is making pt really sick. Very nauseas and dizzy. Pt can barley get up and move. Please advise.

## 2014-02-24 NOTE — Telephone Encounter (Signed)
Daughter notified of Dr. Marliss Coots comments/recommendations, pt will stop med and I advise to keep [place] updated, med added to allergy list and removed form med list

## 2014-02-24 NOTE — Telephone Encounter (Signed)
Daughter said it's the valtrex, pt isn't taking a lot of the tramadol at all (maybe 1/2 tab a day) so daughter is sure it's the valtrex

## 2014-02-24 NOTE — Telephone Encounter (Signed)
Go ahead and stop the valtrex - it would be optimal if she could take it but it is not worth the side effects Keep me updated

## 2014-02-28 ENCOUNTER — Encounter: Payer: Self-pay | Admitting: Family Medicine

## 2014-02-28 ENCOUNTER — Ambulatory Visit (INDEPENDENT_AMBULATORY_CARE_PROVIDER_SITE_OTHER): Payer: Commercial Managed Care - HMO | Admitting: Family Medicine

## 2014-02-28 VITALS — BP 138/84 | HR 74 | Temp 98.5°F | Ht 64.25 in | Wt 122.5 lb

## 2014-02-28 DIAGNOSIS — R040 Epistaxis: Secondary | ICD-10-CM

## 2014-02-28 DIAGNOSIS — B029 Zoster without complications: Secondary | ICD-10-CM

## 2014-02-28 LAB — CBC WITH DIFFERENTIAL/PLATELET
BASOS ABS: 0 10*3/uL (ref 0.0–0.1)
Basophils Relative: 0.2 % (ref 0.0–3.0)
EOS ABS: 0 10*3/uL (ref 0.0–0.7)
Eosinophils Relative: 0.1 % (ref 0.0–5.0)
HCT: 42.4 % (ref 36.0–46.0)
HEMOGLOBIN: 14.1 g/dL (ref 12.0–15.0)
LYMPHS PCT: 16.7 % (ref 12.0–46.0)
Lymphs Abs: 1.5 10*3/uL (ref 0.7–4.0)
MCHC: 33.4 g/dL (ref 30.0–36.0)
MCV: 94.2 fl (ref 78.0–100.0)
MONO ABS: 0.5 10*3/uL (ref 0.1–1.0)
Monocytes Relative: 5.6 % (ref 3.0–12.0)
NEUTROS ABS: 7.1 10*3/uL (ref 1.4–7.7)
Neutrophils Relative %: 77.4 % — ABNORMAL HIGH (ref 43.0–77.0)
Platelets: 221 10*3/uL (ref 150.0–400.0)
RBC: 4.5 Mil/uL (ref 3.87–5.11)
RDW: 14.1 % (ref 11.5–15.5)
WBC: 9.1 10*3/uL (ref 4.0–10.5)

## 2014-02-28 NOTE — Progress Notes (Signed)
Pre visit review using our clinic review tool, if applicable. No additional management support is needed unless otherwise documented below in the visit note. 

## 2014-02-28 NOTE — Patient Instructions (Addendum)
Stop at check out for referral to ENT for nosebleeds Lab today  Try to drink fluids and eat snacks or small meals  Please update [place] if you feel worse  If you have a nosebleed-pinch your nose firmly for at least 10 minutes without letting go Go to ER if nosebleed lasts more than 30 minutes     Nosebleed Nosebleeds can be caused by many conditions, including trauma, infections, polyps, foreign bodies, dry mucous membranes or climate, medicines, and air conditioning. Most nosebleeds occur in the front of the nose. Because of this location, most nosebleeds can be controlled by pinching the nostrils gently and continuously for at least 10 to 20 minutes. The long, continuous pressure allows enough time for the blood to clot. If pressure is released during that 10 to 20 minute time period, the process may have to be started again. The nosebleed may stop by itself or quit with pressure, or it may need concentrated heating (cautery) or pressure from packing. HOME CARE INSTRUCTIONS   If your nose was packed, try to maintain the pack inside until your health care provider removes it. If a gauze pack was used and it starts to fall out, gently replace it or cut the end off. Do not cut if a balloon catheter was used to pack the nose. Otherwise, do not remove unless instructed.  Avoid blowing your nose for 12 hours after treatment. This could dislodge the pack or clot and start the bleeding again.  If the bleeding starts again, sit up and bend forward, gently pinching the front half of your nose continuously for 20 minutes.  If bleeding was caused by dry mucous membranes, use over-the-counter saline nasal spray or gel. This will keep the mucous membranes moist and allow them to heal. If you must use a lubricant, choose the water-soluble variety. Use it only sparingly and not within several hours of lying down.  Do not use petroleum jelly or mineral oil, as these may drip into the lungs and cause serious  problems.  Maintain humidity in your home by using less air conditioning or by using a humidifier.  Do not use aspirin or medicines which make bleeding more likely. Your health care provider can give you recommendations on this.  Resume normal activities as you are able, but try to avoid straining, lifting, or bending at the waist for several days.  If the nosebleeds become recurrent and the cause is unknown, your health care provider may suggest laboratory tests. SEEK MEDICAL CARE IF: You have a fever. SEEK IMMEDIATE MEDICAL CARE IF:   Bleeding recurs and cannot be controlled.  There is unusual bleeding from or bruising on other parts of the body.  Nosebleeds continue.  There is any worsening of the condition which originally brought you in.  You become light-headed, feel faint, become sweaty, or vomit blood. MAKE SURE YOU:   Understand these instructions.  Will watch your condition.  Will get help right away if you are not doing well or get worse. Document Released: 03/19/2005 Document Revised: 10/24/2013 Document Reviewed: 05/10/2009 [facility] Patient Information 2015 [place], [place]. This information is not intended to replace advice given to you by your health care provider. Make sure you discuss any questions you have with your health care provider.

## 2014-02-28 NOTE — Assessment & Plan Note (Signed)
Pt has had 3 nosebleeds in 3 days - mod to severe -now with black stool and nausea from swallowing blood On exam-I cannot see anterior source of R nostril bleeding Ref to ENT inst given -re: nosebleeds-handout also -see AVS Cbc today inst to avoid asa or nsaids  Will begin sips of fluid and bland diet

## 2014-02-28 NOTE — Assessment & Plan Note (Signed)
Slow improvement  Pt intol to both valtrex and tramadol -stopped both Disc  Course of zoster and what to expect Rash is much improved

## 2014-02-28 NOTE — Progress Notes (Signed)
Subjective:    Patient ID: Paula Jimenez, female    DOB: May 10, 1927, 80 y.o.   MRN: [id number]  HPI Pt here for nosebleeds and dark stool  Started on Sat  3 since then  R nostril only  Some sneezing this am - ? Unsure if she has had a cold  Now has a dry cough   Most recent nosebleed was this am  Lasted 15-20 min   She is really unsteady  Dark stool - suspect from swallowing blood  No abd pain  Also some loose stool    She was on valtrex and also the tramadol She stopped them both  Was taking them for shingles  Still hurts - but rash looks quite a bit better -is improving  Also bought a product called "shingles cream"  Still hurts a lot in R back and in the dermatome where the shingles exists   Patient Active Problem List   Diagnosis Date Noted  . Herpes zoster 02/20/2014  . Acute sinusitis 01/06/2014  . Diarrhea 01/06/2014  . Dizziness 01/06/2014  . Toe contusion 01/06/2014  . UTI (urinary tract infection) 10/14/2013  . Hypoglycemia 06/22/2013  . Tick bites 12/01/2012  . Skin lesion of left leg 09/20/2012  . Gynecological examination 11/19/2011  . Colon cancer screening 11/19/2011  . Campylobacter diarrhea 07/23/2011  . HEARING LOSS, BILATERAL 09/23/2007  . History of melanoma 02/25/2007  . BASAL CELL CARCINOMA, NOSE 02/25/2007  . HYPERLIPIDEMIA 02/25/2007  . MITRAL VALVE PROLAPSE 02/25/2007  . ALLERGIC RHINITIS 02/25/2007  . ASTHMA 02/25/2007  . DEGENERATIVE DISC DISEASE, LUMBOSACRAL SPINE 02/25/2007  . OSTEOPOROSIS 02/25/2007  . PALPITATIONS 02/25/2007  . BASAL CELL CARCINOMA, NOSE 02/25/2007  . HYPERTENSION, BENIGN 10/26/2006   Past Medical History  Diagnosis Date  . Allergy   . Asthma   . Hyperlipidemia   . Osteoporosis   . Hypertension   . Bursitis, hip   . Osteopenia 08/01   Past Surgical History  Procedure Laterality Date  . Abdominal hysterectomy    . Breast biopsy  06/99    fibrocystic  . Orif radial fracture  11/99    arm  fracture, radial no surgery   History  Substance Use Topics  . Smoking status: Never Smoker   . Smokeless tobacco: Not on file  . Alcohol Use: No   Family History  Problem Relation Age of Onset  . Leukemia Sister   . Cancer Sister     leukemia  . Heart disease Mother   . Diabetes Mother   . Stroke Father    Allergies  Allergen Reactions  . Ciprofloxacin     diarrhea  . Codeine   . Penicillins     REACTION: mouth swelling  . Raloxifene     REACTION: rash  . Rofecoxib   . Sulfamethoxazole-Trimethoprim     REACTION: unknown  . Valtrex [Valacyclovir Hcl]    Current Outpatient Prescriptions on File Prior to Visit  Medication Sig Dispense Refill  . atenolol (TENORMIN) 50 MG tablet Take 1 tablet (50 mg total) by mouth daily.  30 tablet  11  . clobetasol cream (TEMOVATE) 6.00 % Apply 1 application topically 2 (two) times daily. Apply to affected area twice daily if needed  30 g  5  . fish oil-omega-3 fatty acids 1000 MG capsule Take 1 g by mouth daily.        [name] loratadine (CLARITIN) 10 MG tablet Take 10 mg by mouth as needed.       [name]  simvastatin (ZOCOR) 20 MG tablet Take 1 tablet (20 mg total) by mouth daily.  30 tablet  11  . VITAMIN D, CHOLECALCIFEROL, PO Take 1 tablet by mouth daily.        No current facility-administered medications on file prior to visit.     Review of Systems Review of Systems  Constitutional: Negative for fever, appetite change, and unexpected weight change. pos for fatigue and weakness  ENT pos for congestion/ and nosebleeds  Eyes: Negative for pain and visual disturbance.  Respiratory: Negative for cough and shortness of breath.   Cardiovascular: Negative for cp or palpitations    Gastrointestinal: Negative for nausea, and constipation. pos for dark stool Genitourinary: Negative for urgency and frequency.  Skin: Negative for pallor and pos for improving shingles rash Neurological: Negative for weakness, light-headedness, numbness and headaches.    Hematological: Negative for adenopathy. Does not bruise/bleed easily.  Psychiatric/Behavioral: Negative for dysphoric mood. The patient is not nervous/anxious.         Objective:   Physical Exam  Constitutional: She appears well-developed and well-nourished. No distress.  HENT:  Head: Normocephalic and atraumatic.  Right Ear: External ear normal.  Left Ear: External ear normal.  Mouth/Throat: Oropharynx is clear and moist. No oropharyngeal exudate.  Dried blood in R nare No sinus tenderness  Throat clear   Eyes: Conjunctivae and EOM are normal. Pupils are equal, round, and reactive to light. Right eye exhibits no discharge. Left eye exhibits no discharge. No scleral icterus.  Neck: Normal range of motion. Neck supple.  Cardiovascular: Normal rate and regular rhythm.   Pulmonary/Chest: Effort normal and breath sounds normal.  Abdominal: Soft. Bowel sounds are normal. She exhibits no distension and no mass. There is no tenderness.  Lymphadenopathy:    She has no cervical adenopathy.  Neurological: She is alert. She has normal reflexes.  No focal deficits Generally unsteady on her feet and weak Walks with assistance   Skin: Skin is warm and dry. Rash noted. There is erythema. No pallor.  Shingles rash under R breast is faded and improved   Psychiatric: She has a normal mood and affect.          Assessment & Plan:   Problem List Items Addressed This Visit     Respiratory   Epistaxis, recurrent - Primary     Pt has had 3 nosebleeds in 3 days - mod to severe -now with black stool and nausea from swallowing blood On exam-I cannot see anterior source of R nostril bleeding Ref to ENT inst given -re: nosebleeds-handout also -see AVS Cbc today inst to avoid asa or nsaids  Will begin sips of fluid and bland diet     Relevant Orders      CBC with Differential (Completed)      Ambulatory referral to ENT     Other   Herpes zoster     Slow improvement  Pt intol to both  valtrex and tramadol -stopped both Disc  Course of zoster and what to expect Rash is much improved

## 2014-03-01 ENCOUNTER — Encounter: Payer: Self-pay | Admitting: *Deleted

## 2014-03-01 ENCOUNTER — Ambulatory Visit: Payer: Medicare HMO | Admitting: Family Medicine

## 2014-06-26 ENCOUNTER — Ambulatory Visit (INDEPENDENT_AMBULATORY_CARE_PROVIDER_SITE_OTHER): Payer: Commercial Managed Care - HMO | Admitting: Family Medicine

## 2014-06-26 ENCOUNTER — Encounter: Payer: Self-pay | Admitting: Family Medicine

## 2014-06-26 VITALS — BP 142/80 | HR 73 | Temp 98.0°F | Ht 64.25 in | Wt 126.5 lb

## 2014-06-26 DIAGNOSIS — R300 Dysuria: Secondary | ICD-10-CM

## 2014-06-26 DIAGNOSIS — N3001 Acute cystitis with hematuria: Secondary | ICD-10-CM | POA: Diagnosis not present

## 2014-06-26 DIAGNOSIS — Z23 Encounter for immunization: Secondary | ICD-10-CM

## 2014-06-26 LAB — POCT URINALYSIS DIPSTICK
BILIRUBIN UA: NEGATIVE
GLUCOSE UA: NEGATIVE
KETONES UA: NEGATIVE
NITRITE UA: NEGATIVE
PH UA: 6
Protein, UA: NEGATIVE
Spec Grav, UA: 1.015
Urobilinogen, UA: 0.2

## 2014-06-26 MED ORDER — NITROFURANTOIN MONOHYD MACRO 100 MG PO CAPS: 100.0000 mg | ORAL_CAPSULE | Freq: Two times a day (BID) | ORAL | Status: DC

## 2014-06-26 NOTE — Assessment & Plan Note (Signed)
Mild symptoms - did have small amt of visible hematuria Intol of several abx Try macrobid  Update if problems or side eff Enc fluids Urine cx sent   Update if not starting to improve in several days or if worsening

## 2014-06-26 NOTE — Progress Notes (Signed)
Subjective:    Patient ID: Paula Jimenez, female    DOB: 09/18/1926, 81 y.o.   MRN: [id number]  HPI Here for urinary symptoms   Some pain on urination/ dysuria One time saw small amt of blood in urine  Not feeling sick-no nausea or fever  Not a lot of frequency  Some urgency    Over the holidays - she did drink some coke- which is unusual for her   Pos ua today    Patient Active Problem List   Diagnosis Date Noted  . Epistaxis, recurrent 02/28/2014  . Herpes zoster 02/20/2014  . Acute sinusitis 01/06/2014  . Diarrhea 01/06/2014  . Dizziness 01/06/2014  . Toe contusion 01/06/2014  . UTI (urinary tract infection) 10/14/2013  . Hypoglycemia 06/22/2013  . Tick bites 12/01/2012  . Skin lesion of left leg 09/20/2012  . Gynecological examination 11/19/2011  . Colon cancer screening 11/19/2011  . Campylobacter diarrhea 07/23/2011  . HEARING LOSS, BILATERAL 09/23/2007  . History of melanoma 02/25/2007  . BASAL CELL CARCINOMA, NOSE 02/25/2007  . HYPERLIPIDEMIA 02/25/2007  . MITRAL VALVE PROLAPSE 02/25/2007  . ALLERGIC RHINITIS 02/25/2007  . ASTHMA 02/25/2007  . DEGENERATIVE DISC DISEASE, LUMBOSACRAL SPINE 02/25/2007  . OSTEOPOROSIS 02/25/2007  . PALPITATIONS 02/25/2007  . BASAL CELL CARCINOMA, NOSE 02/25/2007  . HYPERTENSION, BENIGN 10/26/2006   Past Medical History  Diagnosis Date  . Allergy   . Asthma   . Hyperlipidemia   . Osteoporosis   . Hypertension   . Bursitis, hip   . Osteopenia 08/01   Past Surgical History  Procedure Laterality Date  . Abdominal hysterectomy    . Breast biopsy  06/99    fibrocystic  . Orif radial fracture  11/99    arm fracture, radial no surgery   History  Substance Use Topics  . Smoking status: Never Smoker   . Smokeless tobacco: Not on file  . Alcohol Use: No   Family History  Problem Relation Age of Onset  . Leukemia Sister   . Cancer Sister     leukemia  . Heart disease Mother   . Diabetes Mother   . Stroke  Father    Allergies  Allergen Reactions  . Ciprofloxacin     diarrhea  . Codeine   . Penicillins     REACTION: mouth swelling  . Raloxifene     REACTION: rash  . Rofecoxib   . Sulfamethoxazole-Trimethoprim     REACTION: unknown  . Tramadol     dizzy  . Valtrex [Valacyclovir Hcl]    Current Outpatient Prescriptions on File Prior to Visit  Medication Sig Dispense Refill  . atenolol (TENORMIN) 50 MG tablet Take 1 tablet (50 mg total) by mouth daily. 30 tablet 11  . clobetasol cream (TEMOVATE) 3.97 % Apply 1 application topically 2 (two) times daily. Apply to affected area twice daily if needed 30 g 5  . fish oil-omega-3 fatty acids 1000 MG capsule Take 1 g by mouth daily.      [name] loratadine (CLARITIN) 10 MG tablet Take 10 mg by mouth as needed.     . simvastatin (ZOCOR) 20 MG tablet Take 1 tablet (20 mg total) by mouth daily. 30 tablet 11  . VITAMIN D, CHOLECALCIFEROL, PO Take 1 tablet by mouth daily.      No current facility-administered medications on file prior to visit.     Review of Systems Review of Systems  Constitutional: Negative for fever, appetite change, fatigue and unexpected weight change.  Eyes: Negative for pain and visual disturbance.  Respiratory: Negative for cough and shortness of breath.   Cardiovascular: Negative for cp or palpitations    Gastrointestinal: Negative for nausea, diarrhea and constipation.  Genitourinary: pos for urgency and frequency. neg for flank pain/ pos for low back pain / mild Skin: Negative for pallor or rash   Neurological: Negative for weakness, light-headedness, numbness and headaches.  Hematological: Negative for adenopathy. Does not bruise/bleed easily.  Psychiatric/Behavioral: Negative for dysphoric mood. The patient is not nervous/anxious.         Objective:   Physical Exam  Constitutional: She appears well-developed and well-nourished. No distress.  HENT:  Head: Normocephalic and atraumatic.  Mouth/Throat: Oropharynx is  clear and moist.  Eyes: Conjunctivae and EOM are normal. Pupils are equal, round, and reactive to light. No scleral icterus.  Neck: Normal range of motion. Neck supple.  Cardiovascular: Normal rate and regular rhythm.   Pulmonary/Chest: Effort normal and breath sounds normal.  Abdominal: Soft. Bowel sounds are normal. She exhibits no distension. There is no tenderness.  No suprapubic tenderness or fullness  No cva tenderness   Musculoskeletal: She exhibits no edema.  Lymphadenopathy:    She has no cervical adenopathy.  Neurological: She is alert.  Skin: Skin is warm and dry. No rash noted.  Psychiatric: She has a normal mood and affect.          Assessment & Plan:   Problem List Items Addressed This Visit      Genitourinary   UTI (urinary tract infection) - Primary    Mild symptoms - did have small amt of visible hematuria Intol of several abx Try macrobid  Update if problems or side eff Enc fluids Urine cx sent   Update if not starting to improve in several days or if worsening      Relevant Medications      MACROBID 100 MG PO CAPS   Other Relevant Orders      Urine culture    Other Visit Diagnoses    Dysuria        Relevant Orders       POCT urinalysis dipstick (Completed)    Need for prophylactic vaccination and inoculation against influenza        Relevant Orders       Flu Vaccine QUAD 36+ mos PF IM (Fluarix Quad PF) (Completed)    Need for vaccination with 13-polyvalent pneumococcal conjugate vaccine        Relevant Orders       Pneumococcal conjugate vaccine 13-valent (Completed)

## 2014-06-26 NOTE — Patient Instructions (Signed)
I think you have a uti  Drink lots of water  Take nitrofurantoin (macrobid) as directed  We will send urine for a culture -and get back to you with a result

## 2014-06-26 NOTE — Progress Notes (Signed)
Pre visit review using our clinic review tool, if applicable. No additional management support is needed unless otherwise documented below in the visit note. 

## 2014-06-29 LAB — URINE CULTURE: Colony Count: >=100000

## 2014-07-20 ENCOUNTER — Encounter: Payer: Self-pay | Admitting: Internal Medicine

## 2014-07-20 ENCOUNTER — Ambulatory Visit (INDEPENDENT_AMBULATORY_CARE_PROVIDER_SITE_OTHER): Payer: Commercial Managed Care - HMO | Admitting: Internal Medicine

## 2014-07-20 ENCOUNTER — Telehealth: Payer: Self-pay | Admitting: Family Medicine

## 2014-07-20 VITALS — BP 114/70 | HR 71 | Temp 97.8°F | Wt 124.5 lb

## 2014-07-20 DIAGNOSIS — R319 Hematuria, unspecified: Secondary | ICD-10-CM | POA: Diagnosis not present

## 2014-07-20 DIAGNOSIS — R300 Dysuria: Secondary | ICD-10-CM

## 2014-07-20 DIAGNOSIS — N390 Urinary tract infection, site not specified: Secondary | ICD-10-CM

## 2014-07-20 MED ORDER — LEVOFLOXACIN 500 MG PO TABS: 500.0000 mg | ORAL_TABLET | Freq: Every day | ORAL | Status: DC

## 2014-07-20 NOTE — Progress Notes (Signed)
Pre visit review using our clinic review tool, if applicable. No additional management support is needed unless otherwise documented below in the visit note. 

## 2014-07-20 NOTE — Telephone Encounter (Signed)
Pt has appt with Webb Silversmith NP 07/20/14 at 4 pm.

## 2014-07-20 NOTE — Addendum Note (Signed)
Addended by: Lurlean Nanny on: 07/20/2014 05:05 PM   Modules accepted: Orders

## 2014-07-20 NOTE — Progress Notes (Signed)
HPI  Pt presents to the clinic today with c/o dysuria and blood in her urine. She reports her symptoms started this morning. She denies fever, but has had chills. She has not tried anything OTC. She did have a UTI 06/26/14, treated with Macrobid. Culture grew out E Coli, sensitive to Macrobid. She did take all the antibiotics as prescribed and reports she had good relief of symptoms until this morning.   Review of Systems  Past Medical History  Diagnosis Date  . Allergy   . Asthma   . Hyperlipidemia   . Osteoporosis   . Hypertension   . Bursitis, hip   . Osteopenia 08/01    Family History  Problem Relation Age of Onset  . Leukemia Sister   . Cancer Sister     leukemia  . Heart disease Mother   . Diabetes Mother   . Stroke Father     History   Social History  . Marital Status: Widowed    Spouse Name: N/A    Number of Children: 4  . Years of Education: N/A   Occupational History  . retired    Social History Main Topics  . Smoking status: Never Smoker   . Smokeless tobacco: Not on file  . Alcohol Use: No  . Drug Use: No  . Sexual Activity: Not on file   Other Topics Concern  . Not on file   Social History Narrative   caring for daughter with leg injury   caring for sister with leukemia           Allergies  Allergen Reactions  . Ciprofloxacin     diarrhea  . Codeine   . Penicillins     REACTION: mouth swelling  . Raloxifene     REACTION: rash  . Rofecoxib   . Sulfamethoxazole-Trimethoprim     REACTION: unknown  . Tramadol     dizzy  . Valtrex [Valacyclovir Hcl]     Constitutional: Denies fever, malaise, fatigue, headache or abrupt weight changes.   GU: Pt reports frequency, blood in urine and pain with urination. Denies burning sensation, odor or discharge. Skin: Denies redness, rashes, lesions or ulcercations.   No other specific complaints in a complete review of systems (except as listed in HPI above).    Objective:   Physical Exam  BP  114/70 mmHg  Pulse 71  Temp(Src) 97.8 F (36.6 C) (Oral)  Wt 124 lb 8 oz (56.473 kg)  SpO2 98% Wt Readings from Last 3 Encounters:  07/20/14 124 lb 8 oz (56.473 kg)  06/26/14 126 lb 8 oz (57.38 kg)  02/28/14 122 lb 8 oz (55.566 kg)    General: Appears her stated age, well developed, well nourished in NAD. Cardiovascular: Normal rate and rhythm. S1,S2 noted.  No murmur, rubs or gallops noted.  Pulmonary/Chest: Normal effort and positive vesicular breath sounds. No respiratory distress. No wheezes, rales or ronchi noted.  Abdomen: Soft . Normal bowel sounds, no bruits noted. No distention or masses noted. Liver, spleen and kidneys non palpable. Tender to palpation over the bladder area. No CVA tenderness.      Assessment & Plan:   Frequency, Dysuria, Blood in Urine secondary to Recurrent UTI:  Urinalysis: 2+ leuks, 3+ blood, neg nitrites Will send urine culture eRx sent if for Levaquin 500 mg x 10 days- Discussed warnings for tendon rupture in the elderly. Also his of Cipro allergy (diarrhea). Advised pt if she starts having any abnormal pain or diarrhea to stop medication  and call me. OK to take AZO OTC Drink plenty of fluids  RTC as needed or if symptoms persist.

## 2014-07-20 NOTE — Telephone Encounter (Signed)
[facility] Patient Name: BERNIECE ABID DOB: Dec 07, 1926 Initial Comment Caller states she has frequent urine out put and has vaginal bleeding and pain. She wants meds called in. Nurse Assessment Nurse: Erlene Quan, RN, Manuela Schwartz Date/Time Eilene Ghazi Time): 07/20/2014 9:12:09 AM Confirm and document reason for call. If symptomatic, describe symptoms. ---Caller states she has frequent urine out put and has vaginal bleeding and pain - She wants meds called in - she has not had bleeding in years and she can not tell if this is in her urine or vaginal bleeding , states she is still having pain when she urinates and that had gotten better after she finished her antibiotic ( nitrofurantoin ) she started that on Jan 4th Has the patient traveled out of the country within the last 30 days? ---Not Applicable Does the patient require triage? ---Yes Related visit to physician within the last 2 weeks? ---No Does the PT have any chronic conditions? (i.e. diabetes, asthma, etc.) ---No Guidelines Guideline Title Affirmed Question Affirmed Notes Urination Pain - Female Blood in urine (red, pink, or tea-colored) Final Disposition User See Physician within [address] Hours [place], Therapist, sports, Manuela Schwartz

## 2014-07-20 NOTE — Patient Instructions (Signed)

## 2014-07-21 LAB — POCT URINALYSIS DIPSTICK
BILIRUBIN UA: NEGATIVE
Glucose, UA: NEGATIVE
Ketones, UA: NEGATIVE
Nitrite, UA: NEGATIVE
PH UA: 6
Protein, UA: NEGATIVE
SPEC GRAV UA: 1.015
Urobilinogen, UA: NEGATIVE

## 2014-07-22 LAB — URINE CULTURE

## 2014-08-31 DIAGNOSIS — L570 Actinic keratosis: Secondary | ICD-10-CM | POA: Diagnosis not present

## 2014-08-31 DIAGNOSIS — L820 Inflamed seborrheic keratosis: Secondary | ICD-10-CM | POA: Diagnosis not present

## 2014-08-31 DIAGNOSIS — D1801 Hemangioma of skin and subcutaneous tissue: Secondary | ICD-10-CM | POA: Diagnosis not present

## 2014-08-31 DIAGNOSIS — Z85828 Personal history of other malignant neoplasm of skin: Secondary | ICD-10-CM | POA: Diagnosis not present

## 2014-08-31 DIAGNOSIS — L821 Other seborrheic keratosis: Secondary | ICD-10-CM | POA: Diagnosis not present

## 2014-10-18 ENCOUNTER — Encounter: Payer: Self-pay | Admitting: Family Medicine

## 2014-10-18 ENCOUNTER — Ambulatory Visit (INDEPENDENT_AMBULATORY_CARE_PROVIDER_SITE_OTHER): Payer: Commercial Managed Care - HMO | Admitting: Family Medicine

## 2014-10-18 VITALS — BP 132/70 | HR 71 | Temp 98.1°F | Ht 64.25 in | Wt 122.2 lb

## 2014-10-18 DIAGNOSIS — E785 Hyperlipidemia, unspecified: Secondary | ICD-10-CM

## 2014-10-18 DIAGNOSIS — I10 Essential (primary) hypertension: Secondary | ICD-10-CM | POA: Diagnosis not present

## 2014-10-18 LAB — CBC WITH DIFFERENTIAL/PLATELET
Basophils Absolute: 0 10*3/uL (ref 0.0–0.1)
Basophils Relative: 0.4 % (ref 0.0–3.0)
EOS PCT: 0.7 % (ref 0.0–5.0)
Eosinophils Absolute: 0 10*3/uL (ref 0.0–0.7)
HCT: 39.9 % (ref 36.0–46.0)
Hemoglobin: 13.4 g/dL (ref 12.0–15.0)
LYMPHS PCT: 23.9 % (ref 12.0–46.0)
Lymphs Abs: 1.3 10*3/uL (ref 0.7–4.0)
MCHC: 33.5 g/dL (ref 30.0–36.0)
MCV: 91.4 fl (ref 78.0–100.0)
MONOS PCT: 7.6 % (ref 3.0–12.0)
Monocytes Absolute: 0.4 10*3/uL (ref 0.1–1.0)
NEUTROS PCT: 67.4 % (ref 43.0–77.0)
Neutro Abs: 3.8 10*3/uL (ref 1.4–7.7)
PLATELETS: 196 10*3/uL (ref 150.0–400.0)
RBC: 4.37 Mil/uL (ref 3.87–5.11)
RDW: 14.3 % (ref 11.5–15.5)
WBC: 5.6 10*3/uL (ref 4.0–10.5)

## 2014-10-18 LAB — COMPREHENSIVE METABOLIC PANEL
ALBUMIN: 3.8 g/dL (ref 3.5–5.2)
ALK PHOS: 60 U/L (ref 39–117)
ALT: 15 U/L (ref 0–35)
AST: 18 U/L (ref 0–37)
BUN: 12 mg/dL (ref 6–23)
CALCIUM: 9.1 mg/dL (ref 8.4–10.5)
CO2: 32 mEq/L (ref 19–32)
Chloride: 100 mEq/L (ref 96–112)
Creatinine, Ser: 0.61 mg/dL (ref 0.40–1.20)
GFR: 98.85 mL/min (ref >60.00)
Glucose, Bld: 99 mg/dL (ref 70–99)
POTASSIUM: 3.8 meq/L (ref 3.5–5.1)
SODIUM: 136 meq/L (ref 135–145)
Total Bilirubin: 0.4 mg/dL (ref 0.2–1.2)
Total Protein: 6.3 g/dL (ref 6.0–8.3)

## 2014-10-18 LAB — LIPID PANEL
Cholesterol: 152 mg/dL (ref 0–200)
HDL: 52.4 mg/dL (ref >39.00)
LDL CALC: 71 mg/dL (ref 0–99)
NONHDL: 99.6
Total CHOL/HDL Ratio: 3
Triglycerides: 143 mg/dL (ref 0.0–149.0)
VLDL: 28.6 mg/dL (ref 0.0–40.0)

## 2014-10-18 LAB — TSH: TSH: 0.56 u[IU]/mL (ref 0.35–4.50)

## 2014-10-18 MED ORDER — SIMVASTATIN 20 MG PO TABS: 20.0000 mg | ORAL_TABLET | Freq: Every day | ORAL | Status: DC

## 2014-10-18 MED ORDER — ATENOLOL 50 MG PO TABS: 50.0000 mg | ORAL_TABLET | Freq: Every day | ORAL | Status: DC

## 2014-10-18 NOTE — Assessment & Plan Note (Signed)
Lab today for lipids Good /healthy low fat diet most of the time  On simvastatin  Refilled this

## 2014-10-18 NOTE — Progress Notes (Signed)
Pre visit review using our clinic review tool, if applicable. No additional management support is needed unless otherwise documented below in the visit note. 

## 2014-10-18 NOTE — Progress Notes (Signed)
Subjective:    Patient ID: Paula Jimenez, female    DOB: 09-03-1926, 81 y.o.   MRN: [id number]  HPI Here for f/u of chronic health problems   Is doing fairly well  Stays busy - still working a lot outside  A lot of ticks in her yard  Had a spider bite 2 wk ago-that is better now with neosporin - it had been itchy  Wt is down 2 lb with bmi of 20  bp is stable today  No cp or palpitations or headaches or edema  No side effects to medicines - tenormin BP Readings from Last 3 Encounters:  10/18/14 132/70  07/20/14 114/70  06/26/14 142/80     Hx of hyperlipidemia Lab Results  Component Value Date   CHOL 157 10/14/2013   HDL 58.20 10/14/2013   LDLCALC 70 10/14/2013   LDLDIRECT 159.5 02/19/2009   TRIG 143.0 10/14/2013   CHOLHDL 3 10/14/2013   on zocor and diet  Diet is quite good   Some stress Worries about her daughters with various health problems   Patient Active Problem List   Diagnosis Date Noted  . Epistaxis, recurrent 02/28/2014  . Herpes zoster 02/20/2014  . Acute sinusitis 01/06/2014  . Diarrhea 01/06/2014  . Dizziness 01/06/2014  . Toe contusion 01/06/2014  . UTI (urinary tract infection) 10/14/2013  . Hypoglycemia 06/22/2013  . Tick bites 12/01/2012  . Skin lesion of left leg 09/20/2012  . Gynecological examination 11/19/2011  . Colon cancer screening 11/19/2011  . Campylobacter diarrhea 07/23/2011  . HEARING LOSS, BILATERAL 09/23/2007  . History of melanoma 02/25/2007  . BASAL CELL CARCINOMA, NOSE 02/25/2007  . HYPERLIPIDEMIA 02/25/2007  . MITRAL VALVE PROLAPSE 02/25/2007  . ALLERGIC RHINITIS 02/25/2007  . ASTHMA 02/25/2007  . DEGENERATIVE DISC DISEASE, LUMBOSACRAL SPINE 02/25/2007  . OSTEOPOROSIS 02/25/2007  . PALPITATIONS 02/25/2007  . BASAL CELL CARCINOMA, NOSE 02/25/2007  . HYPERTENSION, BENIGN 10/26/2006   Past Medical History  Diagnosis Date  . Allergy   . Asthma   . Hyperlipidemia   . Osteoporosis   . Hypertension   . Bursitis,  hip   . Osteopenia 08/01   Past Surgical History  Procedure Laterality Date  . Abdominal hysterectomy    . Breast biopsy  06/99    fibrocystic  . Orif radial fracture  11/99    arm fracture, radial no surgery   History  Substance Use Topics  . Smoking status: Never Smoker   . Smokeless tobacco: Not on file  . Alcohol Use: No   Family History  Problem Relation Age of Onset  . Leukemia Sister   . Cancer Sister     leukemia  . Heart disease Mother   . Diabetes Mother   . Stroke Father    Allergies  Allergen Reactions  . Ciprofloxacin     diarrhea  . Codeine   . Penicillins     REACTION: mouth swelling  . Raloxifene     REACTION: rash  . Rofecoxib   . Sulfamethoxazole-Trimethoprim     REACTION: unknown  . Tramadol     dizzy  . Valtrex [Valacyclovir Hcl]    Current Outpatient Prescriptions on File Prior to Visit  Medication Sig Dispense Refill  . atenolol (TENORMIN) 50 MG tablet Take 1 tablet (50 mg total) by mouth daily. 30 tablet 11  . clobetasol cream (TEMOVATE) 3.97 % Apply 1 application topically 2 (two) times daily. Apply to affected area twice daily if needed 30 g 5  .  fish oil-omega-3 fatty acids 1000 MG capsule Take 1 g by mouth daily.      [name] levofloxacin (LEVAQUIN) 500 MG tablet Take 1 tablet (500 mg total) by mouth daily. 10 tablet 0  . loratadine (CLARITIN) 10 MG tablet Take 10 mg by mouth as needed.     . nitrofurantoin, macrocrystal-monohydrate, (MACROBID) 100 MG capsule Take 1 capsule (100 mg total) by mouth 2 (two) times daily. 14 capsule 0  . simvastatin (ZOCOR) 20 MG tablet Take 1 tablet (20 mg total) by mouth daily. 30 tablet 11  . VITAMIN D, CHOLECALCIFEROL, PO Take 1 tablet by mouth daily.      No current facility-administered medications on file prior to visit.     Review of Systems Review of Systems  Constitutional: Negative for fever, appetite change, fatigue and unexpected weight change.  Eyes: Negative for pain and visual disturbance.    Respiratory: Negative for cough and shortness of breath.   Cardiovascular: Negative for cp or palpitations    Gastrointestinal: Negative for nausea, diarrhea and constipation.  Genitourinary: Negative for urgency and frequency.  Skin: Negative for pallor or rash   Neurological: Negative for weakness, numbness and headaches. pos for intermittent chronic dizziness (years) no change and no falls  Hematological: Negative for adenopathy. Does not bruise/bleed easily.  Psychiatric/Behavioral: Negative for dysphoric mood. The patient is not nervous/anxious.         Objective:   Physical Exam  Constitutional: She appears well-developed and well-nourished. No distress.  HENT:  Head: Normocephalic and atraumatic.  Mouth/Throat: Oropharynx is clear and moist.  Eyes: Conjunctivae and EOM are normal. Pupils are equal, round, and reactive to light.  Neck: Normal range of motion. Neck supple. No JVD present. Carotid bruit is not present. No thyromegaly present.  Cardiovascular: Normal rate, regular rhythm, normal heart sounds and intact distal pulses.  Exam reveals no gallop.   Wearing supp stockings   Pulmonary/Chest: Effort normal and breath sounds normal. No respiratory distress. She has no wheezes. She has no rales.  No crackles  Abdominal: Soft. Bowel sounds are normal. She exhibits no distension, no abdominal bruit and no mass. There is no tenderness.  Musculoskeletal: She exhibits no edema.  Lymphadenopathy:    She has no cervical adenopathy.  Neurological: She is alert. She has normal reflexes.  Skin: Skin is warm and dry. No rash noted. No erythema.  No insect bites noted   Psychiatric: She has a normal mood and affect.  Cheerful and talkative           Assessment & Plan:   Problem List Items Addressed This Visit      Cardiovascular and Mediastinum   HYPERTENSION, BENIGN - Primary    bp in fair control at this time  BP Readings from Last 1 Encounters:  10/18/14 132/70   No  changes needed Disc lifstyle change with low sodium diet and exercise  Atenolol refilled  Labs today      Relevant Medications   atenolol (TENORMIN) 50 MG tablet   simvastatin (ZOCOR) 20 MG tablet   Other Relevant Orders   CBC with Differential/Platelet   Comprehensive metabolic panel   TSH   Lipid panel     Other   Hyperlipidemia    Lab today for lipids Good /healthy low fat diet most of the time  On simvastatin  Refilled this       Relevant Medications   atenolol (TENORMIN) 50 MG tablet   simvastatin (ZOCOR) 20 MG tablet  Other Relevant Orders   Lipid panel

## 2014-10-18 NOTE — Patient Instructions (Signed)
Stay active  Labs today No change in medicines  Wear protective clothing and insect repellent when working outside   [place] you are doing well

## 2014-10-18 NOTE — Assessment & Plan Note (Signed)
bp in fair control at this time  BP Readings from Last 1 Encounters:  10/18/14 132/70   No changes needed Disc lifstyle change with low sodium diet and exercise  Atenolol refilled  Labs today

## 2014-10-19 ENCOUNTER — Encounter: Payer: Self-pay | Admitting: *Deleted

## 2014-10-25 DIAGNOSIS — Z0100 Encounter for examination of eyes and vision without abnormal findings: Secondary | ICD-10-CM | POA: Diagnosis not present

## 2014-10-25 DIAGNOSIS — I10 Essential (primary) hypertension: Secondary | ICD-10-CM | POA: Diagnosis not present

## 2014-11-10 ENCOUNTER — Encounter: Payer: Self-pay | Admitting: Family Medicine

## 2014-11-10 ENCOUNTER — Ambulatory Visit (INDEPENDENT_AMBULATORY_CARE_PROVIDER_SITE_OTHER): Payer: Commercial Managed Care - HMO | Admitting: Family Medicine

## 2014-11-10 VITALS — BP 132/70 | HR 78 | Temp 98.2°F | Wt 123.0 lb

## 2014-11-10 DIAGNOSIS — R05 Cough: Secondary | ICD-10-CM | POA: Diagnosis not present

## 2014-11-10 DIAGNOSIS — R059 Cough, unspecified: Secondary | ICD-10-CM

## 2014-11-10 MED ORDER — BENZONATATE 200 MG PO CAPS: 200.0000 mg | ORAL_CAPSULE | Freq: Three times a day (TID) | ORAL | Status: DC | PRN

## 2014-11-10 NOTE — Progress Notes (Signed)
Pre visit review using our clinic review tool, if applicable. No additional management support is needed unless otherwise documented below in the visit note.  Sx started with a ST about 5 days ago.  Throat was some better yesterday.  More cough in the meantime, over the last few days.  No NAVD but Paula Jimenez felt hot.  Didn't have a thermometer.  Had some chills and some sweats.  Some rhinorrhea.  No new ear pain (h/o some L ear pain at baseline).  No sputum.  Cough is most bothersome for patient now.  Abd and chest wall sore from coughing.    Taking coricidin and claritin.  Paula Jimenez slept better last night with those meds, less cough last night.  Paula Jimenez feels better today than yesterday.    Meds, vitals, and allergies reviewed.   ROS: See HPI.  Otherwise, noncontributory.  GEN: nad, alert and oriented HEENT: mucous membranes moist, tm w/o erythema, nasal exam w/o erythema, clear discharge noted,  OP with cobblestoning NECK: supple w/o LA CV: rrr.   PULM: ctab, no inc wob, no wheeze, no rhonchi.  EXT: no edema SKIN: no acute rash Well appearing.

## 2014-11-10 NOTE — Patient Instructions (Signed)
Keep taking coricidin and claritin.  If needed, take tessalon for the cough.  You should gradually improve.  Rest and fluids.   Take care.  Glad to see you.

## 2014-11-12 DIAGNOSIS — R059 Cough, unspecified: Secondary | ICD-10-CM | POA: Insufficient documentation

## 2014-11-12 DIAGNOSIS — R05 Cough: Secondary | ICD-10-CM | POA: Insufficient documentation

## 2014-11-12 NOTE — Assessment & Plan Note (Signed)
Likely viral.  Clearly nontoxic.  Can keep taking coricidin and claritin. If needed, take tessalon for the cough.  She should gradually improve.  Rest and fluids.  F/u prn. She agrees.

## 2015-01-05 DIAGNOSIS — L821 Other seborrheic keratosis: Secondary | ICD-10-CM | POA: Diagnosis not present

## 2015-01-05 DIAGNOSIS — Z85828 Personal history of other malignant neoplasm of skin: Secondary | ICD-10-CM | POA: Diagnosis not present

## 2015-01-05 DIAGNOSIS — L820 Inflamed seborrheic keratosis: Secondary | ICD-10-CM | POA: Diagnosis not present

## 2015-01-05 DIAGNOSIS — D485 Neoplasm of uncertain behavior of skin: Secondary | ICD-10-CM | POA: Diagnosis not present

## 2015-01-05 DIAGNOSIS — L570 Actinic keratosis: Secondary | ICD-10-CM | POA: Diagnosis not present

## 2015-02-16 ENCOUNTER — Encounter: Payer: Self-pay | Admitting: Family Medicine

## 2015-02-16 ENCOUNTER — Ambulatory Visit (INDEPENDENT_AMBULATORY_CARE_PROVIDER_SITE_OTHER): Payer: Commercial Managed Care - HMO | Admitting: Family Medicine

## 2015-02-16 VITALS — BP 150/78 | HR 74 | Temp 98.2°F | Ht 64.25 in | Wt 123.0 lb

## 2015-02-16 DIAGNOSIS — N3001 Acute cystitis with hematuria: Secondary | ICD-10-CM | POA: Diagnosis not present

## 2015-02-16 DIAGNOSIS — N390 Urinary tract infection, site not specified: Secondary | ICD-10-CM | POA: Insufficient documentation

## 2015-02-16 DIAGNOSIS — N3000 Acute cystitis without hematuria: Secondary | ICD-10-CM | POA: Diagnosis not present

## 2015-02-16 DIAGNOSIS — R319 Hematuria, unspecified: Secondary | ICD-10-CM

## 2015-02-16 LAB — POCT URINALYSIS DIPSTICK
Bilirubin, UA: NEGATIVE
Glucose, UA: NEGATIVE
KETONES UA: NEGATIVE
Nitrite, UA: NEGATIVE
SPEC GRAV UA: 1.025
Urobilinogen, UA: 0.2
pH, UA: 6

## 2015-02-16 MED ORDER — NITROFURANTOIN MONOHYD MACRO 100 MG PO CAPS: 100.0000 mg | ORAL_CAPSULE | Freq: Two times a day (BID) | ORAL | Status: DC

## 2015-02-16 NOTE — Progress Notes (Signed)
Pre visit review using our clinic review tool, if applicable. No additional management support is needed unless otherwise documented below in the visit note. 

## 2015-02-16 NOTE — Assessment & Plan Note (Signed)
Recurrent (last one in Jan) in pt with hx of multiple med intol  Px macrobid 100 mg bid  Pending cx  Enc her to go to ED if worse or more hematuria  Enc fluids Handout given

## 2015-02-16 NOTE — Progress Notes (Signed)
Subjective:    Patient ID: Paula Jimenez, female    DOB: 05-Jul-1926, 81 y.o.   MRN: [id number]  HPI Here with urinary symptoms incl hematuria and pos ua  Last uti was Jan  ecoli pan sens tx with levaquin (intolerant)  She did stop soda and caffeine  Eating more tomato and chocolate   Symptoms started (with blood in urine) this am  Now urine is clear  Had one night with chills last week  Dysuria this am  Some low back discomfort last week   She drinks water all the time   No n/v  No temp now   Results for orders placed or performed in visit on 02/16/15  Urinalysis Dipstick  Result Value Ref Range   Color, UA Amber    Clarity, UA Cloudy    Glucose, UA Neg.    Bilirubin, UA Neg.    Ketones, UA Neg.    Spec Grav, UA 1.025    Blood, UA Large 200Ery/uL    pH, UA 6.0    Protein, UA 30 mg/dL    Urobilinogen, UA 0.2    Nitrite, UA Neg.    Leukocytes, UA large (3+) (A) Negative     Patient Active Problem List   Diagnosis Date Noted  . UTI (urinary tract infection) 02/16/2015  . Cough 11/12/2014  . Epistaxis, recurrent 02/28/2014  . History of shingles 02/20/2014  . Dizziness 01/06/2014  . Hypoglycemia 06/22/2013  . Colon cancer screening 11/19/2011  . HEARING LOSS, BILATERAL 09/23/2007  . History of melanoma 02/25/2007  . Hyperlipidemia 02/25/2007  . MITRAL VALVE PROLAPSE 02/25/2007  . ALLERGIC RHINITIS 02/25/2007  . ASTHMA 02/25/2007  . DEGENERATIVE DISC DISEASE, LUMBOSACRAL SPINE 02/25/2007  . OSTEOPOROSIS 02/25/2007  . HYPERTENSION, BENIGN 10/26/2006   Past Medical History  Diagnosis Date  . Allergy   . Asthma   . Hyperlipidemia   . Osteoporosis   . Hypertension   . Bursitis, hip   . Osteopenia 08/01   Past Surgical History  Procedure Laterality Date  . Abdominal hysterectomy    . Breast biopsy  06/99    fibrocystic  . Orif radial fracture  11/99    arm fracture, radial no surgery   Social History  Substance Use Topics  . Smoking status:  Never Smoker   . Smokeless tobacco: None  . Alcohol Use: No   Family History  Problem Relation Age of Onset  . Leukemia Sister   . Cancer Sister     leukemia  . Heart disease Mother   . Diabetes Mother   . Stroke Father    Allergies  Allergen Reactions  . Ciprofloxacin     diarrhea  . Codeine   . Levofloxacin   . Penicillins     REACTION: mouth swelling  . Raloxifene     REACTION: rash  . Rofecoxib   . Sulfamethoxazole-Trimethoprim     REACTION: unknown  . Tramadol     dizzy  . Valtrex [Valacyclovir Hcl]    Current Outpatient Prescriptions on File Prior to Visit  Medication Sig Dispense Refill  . atenolol (TENORMIN) 50 MG tablet Take 1 tablet (50 mg total) by mouth daily. 30 tablet 11  . benzonatate (TESSALON) 200 MG capsule Take 1 capsule (200 mg total) by mouth 3 (three) times daily as needed. 30 capsule 1  . clobetasol cream (TEMOVATE) 7.85 % Apply 1 application topically 2 (two) times daily. Apply to affected area twice daily if needed 30 g 5  .  fish oil-omega-3 fatty acids 1000 MG capsule Take 1 g by mouth daily.      [name] loratadine (CLARITIN) 10 MG tablet Take 10 mg by mouth as needed.     . simvastatin (ZOCOR) 20 MG tablet Take 1 tablet (20 mg total) by mouth daily. 30 tablet 11  . VITAMIN D, CHOLECALCIFEROL, PO Take 1 tablet by mouth daily.      No current facility-administered medications on file prior to visit.      Review of Systems Review of Systems  Constitutional: Negative for fever, appetite change, fatigue and unexpected weight change.  Eyes: Negative for pain and visual disturbance.  Respiratory: Negative for cough and shortness of breath.   Cardiovascular: Negative for cp or palpitations    Gastrointestinal: Negative for nausea, diarrhea and constipation.  Genitourinary: pos for urgency and frequency. neg for flank pain  Skin: Negative for pallor or rash   Neurological: Negative for weakness, light-headedness, numbness and headaches.    Hematological: Negative for adenopathy. Does not bruise/bleed easily.  Psychiatric/Behavioral: Negative for dysphoric mood. The patient is not nervous/anxious.         Objective:   Physical Exam  Constitutional: She appears well-developed and well-nourished. No distress.  HENT:  Head: Normocephalic and atraumatic.  Eyes: Conjunctivae and EOM are normal. Pupils are equal, round, and reactive to light.  Neck: Normal range of motion. Neck supple.  Cardiovascular: Normal rate, regular rhythm and normal heart sounds.   Pulmonary/Chest: Effort normal and breath sounds normal.  Abdominal: Soft. Bowel sounds are normal. She exhibits no distension. There is tenderness. There is no rebound.  No cva tenderness  Mild suprapubic tenderness  Musculoskeletal: She exhibits no edema.  Lymphadenopathy:    She has no cervical adenopathy.  Neurological: She is alert.  Skin: No rash noted.  Psychiatric: She has a normal mood and affect.          Assessment & Plan:   Problem List Items Addressed This Visit    UTI (urinary tract infection)    Recurrent (last one in Jan) in pt with hx of multiple med intol  Px macrobid 100 mg bid  Pending cx  Enc her to go to ED if worse or more hematuria  Enc fluids Handout given        Relevant Medications   nitrofurantoin, macrocrystal-monohydrate, (MACROBID) 100 MG capsule   Other Relevant Orders   Urine culture (Completed)    Other Visit Diagnoses    Hematuria    -  Primary    Relevant Orders    Urinalysis Dipstick (Completed)

## 2015-02-16 NOTE — Patient Instructions (Signed)
Drink lots of water  Take the macrobid as directed -start it as soon as possible  If symptoms worsen over the weekend please go to an urgent care  We will contact you when the urine culture returns   Please keep me posted

## 2015-02-18 LAB — URINE CULTURE: Colony Count: 4000

## 2015-03-05 ENCOUNTER — Ambulatory Visit (INDEPENDENT_AMBULATORY_CARE_PROVIDER_SITE_OTHER): Payer: Commercial Managed Care - HMO | Admitting: Family Medicine

## 2015-03-05 ENCOUNTER — Encounter: Payer: Self-pay | Admitting: Family Medicine

## 2015-03-05 VITALS — BP 134/68 | HR 72 | Temp 98.2°F | Ht 64.25 in | Wt 123.5 lb

## 2015-03-05 DIAGNOSIS — B880 Other acariasis: Secondary | ICD-10-CM | POA: Diagnosis not present

## 2015-03-05 MED ORDER — PREDNISONE 10 MG PO TABS: ORAL_TABLET | ORAL | Status: DC

## 2015-03-05 NOTE — Progress Notes (Signed)
Subjective:    Patient ID: Paula Jimenez, female    DOB: 10-19-1926, 81 y.o.   MRN: [id number]  HPI Here with a rash   Started Sat am  Martin Majestic out into the woods to get a kitten - and worked outside also  [place] it briefly with gloves -did not come into the house  Was wearing short sleeves,gloves,pants and hose under her pants   Came back in and washed hands- felt fine Started itching later that day- in the afternoon   No one else got this   Legs / some on arms/ some on back  Face is clear  Scalp is at its baseline  In thigh folds but not genital area  Not in arm pits   No web spaces  Feet are ok  Palms and soles are ok   Patient Active Problem List   Diagnosis Date Noted  . UTI (urinary tract infection) 02/16/2015  . Cough 11/12/2014  . Epistaxis, recurrent 02/28/2014  . History of shingles 02/20/2014  . Dizziness 01/06/2014  . Hypoglycemia 06/22/2013  . Colon cancer screening 11/19/2011  . HEARING LOSS, BILATERAL 09/23/2007  . History of melanoma 02/25/2007  . Hyperlipidemia 02/25/2007  . MITRAL VALVE PROLAPSE 02/25/2007  . ALLERGIC RHINITIS 02/25/2007  . ASTHMA 02/25/2007  . DEGENERATIVE DISC DISEASE, LUMBOSACRAL SPINE 02/25/2007  . OSTEOPOROSIS 02/25/2007  . HYPERTENSION, BENIGN 10/26/2006   Past Medical History  Diagnosis Date  . Allergy   . Asthma   . Hyperlipidemia   . Osteoporosis   . Hypertension   . Bursitis, hip   . Osteopenia 08/01   Past Surgical History  Procedure Laterality Date  . Abdominal hysterectomy    . Breast biopsy  06/99    fibrocystic  . Orif radial fracture  11/99    arm fracture, radial no surgery   Social History  Substance Use Topics  . Smoking status: Never Smoker   . Smokeless tobacco: None  . Alcohol Use: No   Family History  Problem Relation Age of Onset  . Leukemia Sister   . Cancer Sister     leukemia  . Heart disease Mother   . Diabetes Mother   . Stroke Father    Allergies  Allergen Reactions  .  Ciprofloxacin     diarrhea  . Codeine   . Levofloxacin   . Penicillins     REACTION: mouth swelling  . Raloxifene     REACTION: rash  . Rofecoxib   . Sulfamethoxazole-Trimethoprim     REACTION: unknown  . Tramadol     dizzy  . Valtrex [Valacyclovir Hcl]    Current Outpatient Prescriptions on File Prior to Visit  Medication Sig Dispense Refill  . atenolol (TENORMIN) 50 MG tablet Take 1 tablet (50 mg total) by mouth daily. 30 tablet 11  . benzonatate (TESSALON) 200 MG capsule Take 1 capsule (200 mg total) by mouth 3 (three) times daily as needed. 30 capsule 1  . clobetasol cream (TEMOVATE) 3.76 % Apply 1 application topically 2 (two) times daily. Apply to affected area twice daily if needed 30 g 5  . fish oil-omega-3 fatty acids 1000 MG capsule Take 1 g by mouth daily.      [name] loratadine (CLARITIN) 10 MG tablet Take 10 mg by mouth as needed.     . simvastatin (ZOCOR) 20 MG tablet Take 1 tablet (20 mg total) by mouth daily. 30 tablet 11  . VITAMIN D, CHOLECALCIFEROL, PO Take 1 tablet by mouth daily.  No current facility-administered medications on file prior to visit.      Review of Systems Review of Systems  Constitutional: Negative for fever, appetite change, fatigue and unexpected weight change.  Eyes: Negative for pain and visual disturbance.  Respiratory: Negative for cough and shortness of breath.   Cardiovascular: Negative for cp or palpitations    Gastrointestinal: Negative for nausea, diarrhea and constipation.  Genitourinary: Negative for urgency and frequency.  Skin: Negative for pallor and pos for itchy rash/bites  Neurological: Negative for weakness, light-headedness, numbness and headaches.  Hematological: Negative for adenopathy. Does not bruise/bleed easily.  Psychiatric/Behavioral: Negative for dysphoric mood. The patient is not nervous/anxious.         Objective:   Physical Exam  Constitutional: She appears well-developed and well-nourished. No  distress.  Well appearing elderly female   Eyes: Conjunctivae and EOM are normal. Pupils are equal, round, and reactive to light. Right eye exhibits no discharge. Left eye exhibits no discharge.  Neck: Normal range of motion. Neck supple.  Cardiovascular: Normal rate and regular rhythm.   Pulmonary/Chest: Effort normal and breath sounds normal. She has no wheezes.  Musculoskeletal: She exhibits no edema or tenderness.  Lymphadenopathy:    She has no cervical adenopathy.  Neurological: She is alert.  Skin: Skin is warm and dry. Rash noted. No erythema. No pallor.  Scattered small papules and a few vesicles on legs (more on right leg), lower abdomen, arms and few on back -- pink in color   No hives or signs of angioedema   No ticks or tick bites noted   Web spaces /palms and soles and head spared   Psychiatric: She has a normal mood and affect.          Assessment & Plan:   Problem List Items Addressed This Visit      Other   Chigger bites - Primary    Insect bites that resemble chigger bites (by size and distribution) -on legs (worse on R), arms and some on abd/trunk after being in the woods  Will tx with prednisone taper 30 mg  Benadryl otc prn as directed with warning of sedation  Disc symptomatic care - see instructions on AVS - keep cool/trial of oatmeal bath Update if not starting to improve in a week or if worsening  - also if any signs of infection

## 2015-03-05 NOTE — Progress Notes (Signed)
Pre visit review using our clinic review tool, if applicable. No additional management support is needed unless otherwise documented below in the visit note. 

## 2015-03-05 NOTE — Patient Instructions (Signed)
I think you have chigger bites from being outdoors recently  Wash all the clothing you worse in hot water Stay cool (that helps itching)  Oatmeal bath (like aaveno) helps also  Take the prednisone as directed (it may make you hyper and hungry- do not be surprised) Benadryl for itching is helpful - 25-50 mg up to every 4-6 hours -watch out for sedation with this Keep rash clean with gentle soap and water (dove)  Update if not starting to improve in a week or if worsening

## 2015-03-05 NOTE — Assessment & Plan Note (Signed)
Insect bites that resemble chigger bites (by size and distribution) -on legs (worse on R), arms and some on abd/trunk after being in the woods  Will tx with prednisone taper 30 mg  Benadryl otc prn as directed with warning of sedation  Disc symptomatic care - see instructions on AVS - keep cool/trial of oatmeal bath Update if not starting to improve in a week or if worsening  - also if any signs of infection

## 2015-03-14 ENCOUNTER — Telehealth: Payer: Self-pay | Admitting: Family Medicine

## 2015-03-14 ENCOUNTER — Encounter: Payer: Self-pay | Admitting: Emergency Medicine

## 2015-03-14 ENCOUNTER — Emergency Department
Admission: EM | Admit: 2015-03-14 | Discharge: 2015-03-14 | Disposition: A | Discharge status: Home / Self Care | Payer: Commercial Managed Care - HMO | Source: Home / Self Care | Attending: Family Medicine | Admitting: Family Medicine

## 2015-03-14 DIAGNOSIS — R300 Dysuria: Secondary | ICD-10-CM | POA: Diagnosis not present

## 2015-03-14 DIAGNOSIS — N3090 Cystitis, unspecified without hematuria: Secondary | ICD-10-CM

## 2015-03-14 LAB — POCT URINALYSIS DIP (MANUAL ENTRY)
Bilirubin, UA: NEGATIVE
GLUCOSE UA: NEGATIVE
Ketones, POC UA: NEGATIVE
Nitrite, UA: NEGATIVE
Protein Ur, POC: NEGATIVE
SPEC GRAV UA: 1.01 (ref 1.005–1.03)
UROBILINOGEN UA: 0.2 (ref 0–1)
pH, UA: 5.5 (ref 5–8)

## 2015-03-14 MED ORDER — NITROFURANTOIN MONOHYD MACRO 100 MG PO CAPS: 100.0000 mg | ORAL_CAPSULE | Freq: Two times a day (BID) | ORAL | Status: DC

## 2015-03-14 NOTE — Discharge Instructions (Signed)
Increase fluid intake. °If symptoms become significantly worse during the night or over the weekend, proceed to the local emergency room.  ° ° °Urinary Tract Infection °Urinary tract infections (UTIs) can develop anywhere along your urinary tract. Your urinary tract is your body's drainage system for removing wastes and extra water. Your urinary tract includes two kidneys, two ureters, a bladder, and a urethra. Your kidneys are a pair of bean-shaped organs. Each kidney is about the size of your fist. They are located below your ribs, one on each side of your spine. °CAUSES °Infections are caused by microbes, which are microscopic organisms, including fungi, viruses, and bacteria. These organisms are so small that they can only be seen through a microscope. Bacteria are the microbes that most commonly cause UTIs. °SYMPTOMS  °Symptoms of UTIs may vary by age and gender of the patient and by the location of the infection. Symptoms in young women typically include a frequent and intense urge to urinate and a painful, burning feeling in the bladder or urethra during urination. Older women and men are more likely to be tired, shaky, and weak and have muscle aches and abdominal pain. A fever may mean the infection is in your kidneys. Other symptoms of a kidney infection include pain in your back or sides below the ribs, nausea, and vomiting. °DIAGNOSIS °To diagnose a UTI, your caregiver will ask you about your symptoms. Your caregiver also will ask to provide a urine sample. The urine sample will be tested for bacteria and white blood cells. White blood cells are made by your body to help fight infection. °TREATMENT  °Typically, UTIs can be treated with medication. Because most UTIs are caused by a bacterial infection, they usually can be treated with the use of antibiotics. The choice of antibiotic and length of treatment depend on your symptoms and the type of bacteria causing your infection. °HOME CARE  INSTRUCTIONS °· If you were prescribed antibiotics, take them exactly as your caregiver instructs you. Finish the medication even if you feel better after you have only taken some of the medication. °· Drink enough water and fluids to keep your urine clear or pale yellow. °· Avoid caffeine, tea, and carbonated beverages. They tend to irritate your bladder. °· Empty your bladder often. Avoid holding urine for long periods of time. °· Empty your bladder before and after sexual intercourse. °· After a bowel movement, women should cleanse from front to back. Use each tissue only once. °SEEK MEDICAL CARE IF:  °· You have back pain. °· You develop a fever. °· Your symptoms do not begin to resolve within 3 days. °SEEK IMMEDIATE MEDICAL CARE IF:  °· You have severe back pain or lower abdominal pain. °· You develop chills. °· You have nausea or vomiting. °· You have continued burning or discomfort with urination. °MAKE SURE YOU:  °· Understand these instructions. °· Will watch your condition. °· Will get help right away if you are not doing well or get worse. °Document Released: 03/19/2005 Document Revised: 12/09/2011 Document Reviewed: 07/18/2011 °[employer]® Patient Information ©2015 [employer]. This information is not intended to replace advice given to you by your health care provider. Make sure you discuss any questions you have with your health care provider. ° °

## 2015-03-14 NOTE — Telephone Encounter (Signed)
She went to urgent care - see the note in epic

## 2015-03-14 NOTE — Telephone Encounter (Signed)
Patient Name: Paula Jimenez  DOB: [birth year]    Initial Comment Caller states thinks having bladder infection; pressure and pain; wants meds called in; [facility] in [place];    Nurse Assessment  Nurse: Thad Ranger RN, Langley Gauss Date/Time (Eastern Time): 03/14/2015 4:24:38 PM  Confirm and document reason for call. If symptomatic, describe symptoms. ---Pt reports she has dysuria, freq/urg, and denies back pain, fever or blood in her urine. Is OOT at this time visiting dtr. Had UTI 2 wks ago, finished abx, and now UTI returns.  Has the patient traveled out of the country within the last 30 days? ---Not Applicable  Does the patient require triage? ---Yes  Related visit to physician within the last 2 weeks? ---Yes  Does the PT have any chronic conditions? (i.e. diabetes, asthma, etc.) ---No     Guidelines    Guideline Title Affirmed Question Affirmed Notes  Urination Pain - Female Age > 46 years    Final Disposition User   See Physician within 24 Hours Carmon, RN, Langley Gauss    Comments  Person answering secondary number is pt dtr but state the pt is with Bethena Roys, her other dtr. Gave Judy's number.  CBWN: caller states she missed nurses cb, nurse notified   Referrals  GO TO FACILITY UNDECIDED   Disagree/Comply: Comply

## 2015-03-14 NOTE — ED Notes (Addendum)
Pt c/o urinary frequency and urgency x1 days. Denies fever or hematuria.

## 2015-03-14 NOTE — ED Provider Notes (Signed)
CSN: [id number]     Arrival date & time 03/14/15  1801 History   First MD Initiated Contact with Patient 03/14/15 1903     Chief Complaint  Patient presents with  . Urinary Tract Infection      HPI Comments: Patient awoke this morning with dysuria and frequency. She feels well otherwise.  No abdominal or pelvic pain.  No fevers, chills, and sweats  She had similar symptoms 3 weeks ago that resolved with a course of Macrobid.  She has multiple antibiotic allergies.  Patient is a 81 y.o. female presenting with dysuria. The history is provided by the patient.  Dysuria Pain quality:  Burning Pain severity:  Mild Onset quality:  Sudden Duration:  10 hours Timing:  Constant Progression:  Worsening Chronicity:  Recurrent Recent urinary tract infections: yes   Relieved by:  None tried Worsened by:  Nothing tried Ineffective treatments:  None tried Urinary symptoms: frequent urination and hesitancy   Urinary symptoms: no discolored urine, no foul-smelling urine, no hematuria and no bladder incontinence   Associated symptoms: no abdominal pain, no fever, no flank pain, no genital lesions, no nausea, no vaginal discharge and no vomiting     Past Medical History  Diagnosis Date  . Allergy   . Asthma   . Hyperlipidemia   . Osteoporosis   . Hypertension   . Bursitis, hip   . Osteopenia 08/01   Past Surgical History  Procedure Laterality Date  . Abdominal hysterectomy    . Breast biopsy  06/99    fibrocystic  . Orif radial fracture  11/99    arm fracture, radial no surgery   Family History  Problem Relation Age of Onset  . Leukemia Sister   . Cancer Sister     leukemia  . Heart disease Mother   . Diabetes Mother   . Stroke Father    Social History  Substance Use Topics  . Smoking status: Never Smoker   . Smokeless tobacco: None  . Alcohol Use: No   OB History    No data available     Review of Systems  Constitutional: Negative for fever.  Gastrointestinal: Negative  for nausea, vomiting and abdominal pain.  Genitourinary: Positive for dysuria. Negative for flank pain and vaginal discharge.  All other systems reviewed and are negative.   Allergies  Ciprofloxacin; Codeine; Levofloxacin; Penicillins; Raloxifene; Rofecoxib; Sulfamethoxazole-trimethoprim; Tramadol; and Valtrex  Home Medications   Prior to Admission medications   Medication Sig Start Date End Date Taking? Authorizing Provider  atenolol (TENORMIN) 50 MG tablet Take 1 tablet (50 mg total) by mouth daily. 10/18/14   Abner Greenspan, MD  benzonatate (TESSALON) 200 MG capsule Take 1 capsule (200 mg total) by mouth 3 (three) times daily as needed. 11/10/14   Tonia Ghent, MD  clobetasol cream (TEMOVATE) 0.53 % Apply 1 application topically 2 (two) times daily. Apply to affected area twice daily if needed 10/14/13   Abner Greenspan, MD  fish oil-omega-3 fatty acids 1000 MG capsule Take 1 g by mouth daily.      Historical Provider, MD  loratadine (CLARITIN) 10 MG tablet Take 10 mg by mouth as needed.     Historical Provider, MD  nitrofurantoin, macrocrystal-monohydrate, (MACROBID) 100 MG capsule Take 1 capsule (100 mg total) by mouth 2 (two) times daily. Take with food. 03/14/15   Kandra Nicolas, MD  predniSONE (DELTASONE) 10 MG tablet Take 3 pills once daily by mouth for 3 days, then 2 pills  once daily for 3 days, then 1 pill once daily for 3 days and then stop 03/05/15   Abner Greenspan, MD  simvastatin (ZOCOR) 20 MG tablet Take 1 tablet (20 mg total) by mouth daily. 10/18/14   Abner Greenspan, MD  VITAMIN D, CHOLECALCIFEROL, PO Take 1 tablet by mouth daily.     Historical Provider, MD   Meds Ordered and Administered this Visit  Medications - No data to display  BP 163/77 mmHg  Pulse 68  Temp(Src) 97.4 F (36.3 C) (Oral)  SpO2 95% No data found.   Physical Exam Nursing notes and Vital Signs reviewed. Appearance:  Patient appears stated age, and in no acute distress Eyes:  Pupils are equal, round,  and reactive to light and accomodation.  Extraocular movement is intact.  Conjunctivae are not inflamed  Nose:  Normal Pharynx:  Normal Neck:  Supple.  No adenopathy Lungs:  Clear to auscultation.  Breath sounds are equal.  Moving air well. Heart:  Regular rate and rhythm without murmurs, rubs, or gallops.  Abdomen:  Nontender without masses or hepatosplenomegaly.  Bowel sounds are present.  No CVA or flank tenderness.  Extremities:  No edema.  No calf tenderness Skin:  No rash present.   ED Course  Procedures  None    Labs Reviewed  POCT URINALYSIS DIP (MANUAL ENTRY) - Abnormal; Notable for the following:    Blood, UA trace-lysed (*)    Leukocytes, UA large (3+) (*)    All other components within normal limits  URINE CULTURE      MDM   1. Cystitis    Urine culture pending. Patient has limited choices of antibiotics that she can take because of allergies. Begin Macrobid 100mg  BID for one week. Increase fluid intake. If symptoms become significantly worse during the night or over the weekend, proceed to the local emergency room. Followup with Family Doctor if not improved in one week.   Followup with urologist for recurrent UTI    Kandra Nicolas, MD 03/15/15 1249

## 2015-03-15 ENCOUNTER — Telehealth: Payer: Self-pay | Admitting: Family Medicine

## 2015-03-15 DIAGNOSIS — N390 Urinary tract infection, site not specified: Secondary | ICD-10-CM

## 2015-03-15 NOTE — Telephone Encounter (Signed)
Pt called in and would like referral to urologist for uti's best number is cell [phone] gso or bur Any day any time

## 2015-03-15 NOTE — Telephone Encounter (Signed)
Ref done  

## 2015-03-17 LAB — URINE CULTURE

## 2015-03-19 ENCOUNTER — Telehealth: Payer: Self-pay | Admitting: *Deleted

## 2015-03-23 ENCOUNTER — Ambulatory Visit: Payer: Self-pay | Admitting: Obstetrics and Gynecology

## 2015-03-29 ENCOUNTER — Ambulatory Visit (INDEPENDENT_AMBULATORY_CARE_PROVIDER_SITE_OTHER): Payer: Commercial Managed Care - HMO | Admitting: Obstetrics and Gynecology

## 2015-03-29 ENCOUNTER — Other Ambulatory Visit: Payer: Self-pay | Admitting: Obstetrics and Gynecology

## 2015-03-29 ENCOUNTER — Encounter: Payer: Self-pay | Admitting: Obstetrics and Gynecology

## 2015-03-29 VITALS — BP 157/78 | HR 69 | Temp 97.5°F | Resp 16 | Ht 65.0 in | Wt 122.6 lb

## 2015-03-29 DIAGNOSIS — N952 Postmenopausal atrophic vaginitis: Secondary | ICD-10-CM | POA: Diagnosis not present

## 2015-03-29 DIAGNOSIS — N390 Urinary tract infection, site not specified: Secondary | ICD-10-CM | POA: Diagnosis not present

## 2015-03-29 DIAGNOSIS — R310 Gross hematuria: Secondary | ICD-10-CM | POA: Diagnosis not present

## 2015-03-29 LAB — BLADDER SCAN AMB NON-IMAGING

## 2015-03-29 NOTE — Progress Notes (Signed)
03/29/2015 1:59 PM   Paula Jimenez 01-01-1927 [id number]  Referring provider: Abner Greenspan, MD [address] [place], [place] [address]  Chief Complaint  Patient presents with  . Recurrent UTI  . Establish Care    HPI: Patient is a 81 year old female presenting today as a referral from her primary care provider for an episode of gross hematuria on 02/16/15. Urine dipstick was also positive for large blood at that time. She does have a history of recurrent urinary tract infections with multiple positive cultures greater than 100,000 Escherichia coli over the last year. Most recent culture however performed on 03/14/15 was positive for greater than 100,000 colonies of Klebsiella pneumoniae. She does have allergies/intolerance to multiple antibiotics. She was most recently treated with 1 week course of Macrobid.  Patient reports only bothersome urinary symptoms when she has an infection. Symptoms include frequent urination, urgency, dysuria and nocturia. She is asymptomatic today. At baseline she does state that she experiences some difficulty emptying her bladder requiring her to change positions while seated on the toilet.  No history of kidney stones. She is status post partial abdominal hysterectomy. She denies any recent change in vaginal symptoms but she states that she has felt pressure and a bulge in her vagina previously but believes it may have gone away.  PMH: Past Medical History  Diagnosis Date  . Allergy   . Asthma   . Hyperlipidemia   . Osteoporosis   . Hypertension   . Bursitis, hip   . Osteopenia 08/01  . Skin cancer     Surgical History: Past Surgical History  Procedure Laterality Date  . Abdominal hysterectomy    . Breast biopsy  06/99    fibrocystic  . Orif radial fracture  11/99    arm fracture, radial no surgery  . Eye surgery Bilateral 2011    June and August of 2011    Home Medications:    Medication List         This list is accurate as of: 03/29/15  1:59 PM.  Always use your most recent med list.               atenolol 50 MG tablet  Commonly known as:  TENORMIN  Take 1 tablet (50 mg total) by mouth daily.     benzonatate 200 MG capsule  Commonly known as:  TESSALON  Take 1 capsule (200 mg total) by mouth 3 (three) times daily as needed.     fish oil-omega-3 fatty acids 1000 MG capsule  Take 1 g by mouth daily.     loratadine 10 MG tablet  Commonly known as:  CLARITIN  Take 10 mg by mouth as needed.     simvastatin 20 MG tablet  Commonly known as:  ZOCOR  Take 1 tablet (20 mg total) by mouth daily.     VITAMIN D (CHOLECALCIFEROL) PO  Take 1 tablet by mouth daily.        Allergies:  Allergies  Allergen Reactions  . Ciprofloxacin     diarrhea  . Codeine   . Levofloxacin   . Penicillins     REACTION: mouth swelling  . Raloxifene     REACTION: rash  . Rofecoxib   . Sulfamethoxazole-Trimethoprim     REACTION: unknown  . Tramadol     dizzy  . Valtrex [Valacyclovir Hcl]     Family History: Family History  Problem Relation Age of Onset  . Leukemia Sister   .  Cancer Sister     leukemia  . Heart disease Mother   . Diabetes Mother   . Stroke Father   . Heart failure Mother     Social History:  reports that she has never smoked. She does not have any smokeless tobacco history on file. She reports that she does not drink alcohol or use illicit drugs.  ROS: UROLOGY Frequent Urination?: Yes Hard to postpone urination?: Yes Burning/pain with urination?: Yes Get up at night to urinate?: Yes Leakage of urine?: No Urine stream starts and stops?: No Trouble starting stream?: No Do you have to strain to urinate?: No Blood in urine?: Yes Urinary tract infection?: Yes Sexually transmitted disease?: No Injury to kidneys or bladder?: No Painful intercourse?: No Weak stream?: No Currently pregnant?: No Vaginal bleeding?: No Last menstrual period?:  n/a  Gastrointestinal Nausea?: Yes Vomiting?: No Indigestion/heartburn?: No Diarrhea?: Yes Constipation?: Yes  Constitutional Fever: No Night sweats?: No Weight loss?: No Fatigue?: Yes  Skin Skin rash/lesions?: Yes Itching?: Yes  Eyes Blurred vision?: Yes Double vision?: No  Ears/Nose/Throat Sore throat?: No Sinus problems?: No  Hematologic/Lymphatic Swollen glands?: No Easy bruising?: Yes  Cardiovascular Leg swelling?: Yes Chest pain?: No  Respiratory Cough?: Yes Shortness of breath?: No  Endocrine Excessive thirst?: No  Musculoskeletal Back pain?: No Joint pain?: No  Neurological Headaches?: Yes Dizziness?: Yes  Psychologic Depression?: No Anxiety?: Yes  Physical Exam: BP 157/78 mmHg  Pulse 69  Temp(Src) 97.5 F (36.4 C)  Resp 16  Ht 5\' 5"  (1.651 m)  Wt 122 lb 9.6 oz (55.611 kg)  BMI 20.40 kg/m2  Constitutional:  Alert and oriented, No acute distress. HEENT: [place] AT, moist mucus membranes.  Trachea midline, no masses. Cardiovascular: No clubbing, cyanosis, or edema. Respiratory: Normal respiratory effort, no increased work of breathing. GI: Abdomen is soft, nontender, nondistended, no abdominal masses GU: No CVA tenderness.  Pelvic Exam: Grade 2-3 cystocele present with Valsalva. No demonstratable SUI, mucosal pale with decreased rugae, urethral caruncle present Skin: No rashes, bruises or suspicious lesions. Lymph: No cervical or inguinal adenopathy. Neurologic: Grossly intact, no focal deficits, moving all 4 extremities. Psychiatric: Normal mood and affect.  Laboratory Data: Lab Results  Component Value Date   WBC 5.6 10/18/2014   HGB 13.4 10/18/2014   HCT 39.9 10/18/2014   MCV 91.4 10/18/2014   PLT 196.0 10/18/2014    Lab Results  Component Value Date   CREATININE 0.61 10/18/2014    No results found for: PSA  No results found for: TESTOSTERONE  No results found for: HGBA1C  Urinalysis    Component Value Date/Time    COLORURINE yellow 09/02/2010 1003   APPEARANCEUR Clear 09/02/2010 1003   LABSPEC 1.010 09/02/2010 1003   PHURINE 6.0 09/02/2010 1003   GLUCOSEU Negative 03/29/2015 1105   HGBUR trace-lysed 09/02/2010 1003   BILIRUBINUR Negative 03/29/2015 1105   BILIRUBINUR negative 03/14/2015 1909   BILIRUBINUR Neg. 02/16/2015 1016   BILIRUBINUR negative 09/02/2010 1003   KETONESUR negative 03/14/2015 1909   PROTEINUR 30 mg/dL 02/16/2015 1016   UROBILINOGEN 0.2 03/14/2015 1909   UROBILINOGEN 0.2 09/02/2010 1003   NITRITE Negative 03/29/2015 1105   NITRITE Negative 03/14/2015 1909   NITRITE Neg. 02/16/2015 1016   NITRITE negative 09/02/2010 1003   LEUKOCYTESUR 1+* 03/29/2015 1105   LEUKOCYTESUR large (3+)* 03/14/2015 1909    Pertinent Imaging:   Assessment & Plan:    1. Gross hematuria- We discussed the differential diagnosis for hematuria including nephrolithiasis, renal or upper tract tumors, bladder  stones, UTIs, or bladder tumors as well as undetermined etiologies. Per AUA guidelines, I did recommend complete microscopic hematuria evaluation including CTU, possible urine cytology, and office cystoscopy. - Urinalysis, Complete - BLADDER SCAN AMB NON-IMAGING  2. Recurrent UTI- Recurrent urinary tract infections with multiple positive cultures greater than 100,000 Escherichia coli over the last year. Most recent culture however performed on 03/14/15 was positive for greater than 100,000 colonies of Klebsiella pneumoniae. She does have allergies/intolerance to multiple antibiotics. She was most recently treated with 1 week course of Macrobid. Patient is currently asymptomatic and UA is unremarkable today.  UTI prevention strategies discussed.  Good perineal hygiene reviewed. Patient is encouraged to increase daily water intake, start cranberry supplements to prevent invasive colonization along the urinary tract and probiotics, especially lactobacillus to restore normal vaginal flora. We'll treat  vaginal atrophy and cystocele and hopefully this will reduce the frequency of her urinary tract infections as well.  3. Vaginal Atropy- Will start vaginal estrogen replacement therapy today. Patient instructed to use 1/2 g twice weekly and apply pea-sized amount of green to urethra twice weekly as well.  4. Cystocele- Grade 2-3 with valsalva. Referral place to GYN for pessary evaluation. PVR not elevated today but patient states she does have difficulty emptying her bladder on a regular basis.  5. Urethral caruncle- Currently asymptomatic but may be the source of patient's hematuria. Will treat with vaginal estrogen replacement.  Return for for CT Urogram results; schedule cystoscopy.  Herbert Moors, [facility] [address], [place], [place] [address] [phone]

## 2015-03-29 NOTE — Patient Instructions (Signed)
Increase daily water intake, start cranberry supplements to prevent invasive colonization along the urinary tract and probiotics, especially lactobacillus to restore normal vaginal flora. Cranberry supplements twice daily Start daily probiotics Referral placed to Gyn for you to be evaluated for a pessary for your bladder prolapse Start estrogen cream 1/2 gram vaginally and pea sized amount to urethra twice weekly. You were provided samples of cream today.

## 2015-03-30 LAB — URINALYSIS, COMPLETE
Bilirubin, UA: NEGATIVE
Glucose, UA: NEGATIVE
Ketones, UA: NEGATIVE
NITRITE UA: NEGATIVE
PH UA: 5.5 (ref 5.0–7.5)
PROTEIN UA: NEGATIVE
Specific Gravity, UA: 1.02 (ref 1.005–1.030)
UUROB: 0.2 mg/dL (ref 0.2–1.0)

## 2015-03-30 LAB — MICROSCOPIC EXAMINATION: RBC, UA: NONE SEEN /hpf (ref 0–?)

## 2015-04-04 ENCOUNTER — Ambulatory Visit
Admission: RE | Admit: 2015-04-04 | Discharge: 2015-04-04 | Disposition: A | Discharge status: Home / Self Care | Payer: Commercial Managed Care - HMO | Source: Ambulatory Visit | Attending: Obstetrics and Gynecology | Admitting: Obstetrics and Gynecology

## 2015-04-04 DIAGNOSIS — I700 Atherosclerosis of aorta: Secondary | ICD-10-CM | POA: Insufficient documentation

## 2015-04-04 DIAGNOSIS — K5730 Diverticulosis of large intestine without perforation or abscess without bleeding: Secondary | ICD-10-CM | POA: Insufficient documentation

## 2015-04-04 DIAGNOSIS — R310 Gross hematuria: Secondary | ICD-10-CM | POA: Diagnosis not present

## 2015-04-04 DIAGNOSIS — N134 Hydroureter: Secondary | ICD-10-CM | POA: Diagnosis not present

## 2015-04-04 LAB — POCT I-STAT CREATININE: CREATININE: 0.7 mg/dL (ref 0.44–1.00)

## 2015-04-04 MED ORDER — IOHEXOL 300 MG/ML  SOLN
100.0000 mL | Freq: Once | INTRAMUSCULAR | Status: AC | PRN
  Administered 2015-04-04: 100 mL via INTRAVENOUS

## 2015-04-24 ENCOUNTER — Encounter: Payer: Self-pay | Admitting: Urology

## 2015-04-24 ENCOUNTER — Ambulatory Visit (INDEPENDENT_AMBULATORY_CARE_PROVIDER_SITE_OTHER): Payer: Commercial Managed Care - HMO | Admitting: Urology

## 2015-04-24 ENCOUNTER — Telehealth: Payer: Self-pay | Admitting: Radiology

## 2015-04-24 VITALS — BP 178/91 | HR 73 | Ht 65.0 in | Wt 124.2 lb

## 2015-04-24 DIAGNOSIS — D494 Neoplasm of unspecified behavior of bladder: Secondary | ICD-10-CM | POA: Diagnosis not present

## 2015-04-24 DIAGNOSIS — D3001 Benign neoplasm of right kidney: Secondary | ICD-10-CM

## 2015-04-24 DIAGNOSIS — R310 Gross hematuria: Secondary | ICD-10-CM

## 2015-04-24 LAB — MICROSCOPIC EXAMINATION
Bacteria, UA: NONE SEEN
Epithelial Cells (non renal): >10 /hpf — AB (ref 0–10)
Renal Epithel, UA: NONE SEEN /hpf

## 2015-04-24 LAB — URINALYSIS, COMPLETE
BILIRUBIN UA: NEGATIVE
GLUCOSE, UA: NEGATIVE
KETONES UA: NEGATIVE
Nitrite, UA: NEGATIVE
PROTEIN UA: NEGATIVE
Urobilinogen, Ur: 0.2 mg/dL (ref 0.2–1.0)
pH, UA: 5.5 (ref 5.0–7.5)

## 2015-04-24 MED ORDER — DOXYCYCLINE MONOHYDRATE 100 MG PO CAPS: 100.0000 mg | ORAL_CAPSULE | Freq: Every day | ORAL | Status: DC

## 2015-04-24 MED ORDER — LIDOCAINE HCL 2 % EX GEL
1.0000 "application " | Freq: Once | CUTANEOUS | Status: AC
  Administered 2015-04-24: 1 via URETHRAL

## 2015-04-24 MED ORDER — DOXYCYCLINE MONOHYDRATE 100 MG PO CAPS
100.0000 mg | ORAL_CAPSULE | Freq: Once | ORAL | Status: AC
  Administered 2015-04-24: 100 mg via ORAL

## 2015-04-24 NOTE — Progress Notes (Signed)
Patient returns an evaluation for gross hematuria. 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 She's had no further gross hematuria. She said no dysuria. On the CT scan she had a distended bladder but prior PVR was reported as normal. Her kidney function has remained normal with a creatinine of 0.6-0.7.   Physical exam: Chaperoned: Kelita Abdomen is soft and nontender without mass Vaginal introitus shows no lesion, atrophy. Small urethral Renne Musca.   The patient was prepped and the 15 French flexible cystoscope was inserted per urethra. On cystoscopy the urethra appeared normal, at the bladder neck there are 2 cystic lesions which appear to be areas of cystitis glandularis. The trigone and ureteral orifices appeared normal with clear efflux. The remainder of the bladder mucosa appeared normal. There was no papillary tumor. No stone or foreign body in the bladder.   Assessment/plan: #1 her UTI, possible bladder distention-will need to monitor PVR in the future  #2 bladder neoplasm-likely benign cystic lesions at the bladder neck 1 need biopsy and fulguration. I discussed with the patient and her daughter the nature risks benefits and alternatives to cystoscopy with bladder biopsy. All questions answered. She elected to proceed. Urine was sent for culture.  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 Discussed more benign appearing features of this lesion which contains fat and is likely an AML. Will reimage in about 6 months and if stable can image yearly.

## 2015-04-24 NOTE — Telephone Encounter (Signed)
Notified pt of surgery scheduled 04/30/15, pre-admit appt on 04/25/15 @11 :15 and to call Friday prior to surgery for arrival time to [facility]. Pt also gave the phone to her daughter who was given the same information. Both pt and daughter voice understanding.

## 2015-04-25 ENCOUNTER — Encounter
Admission: RE | Admit: 2015-04-25 | Discharge: 2015-04-25 | Disposition: A | Discharge status: Home / Self Care | Payer: Commercial Managed Care - HMO | Source: Ambulatory Visit | Attending: Urology | Admitting: Urology

## 2015-04-25 ENCOUNTER — Ambulatory Visit (INDEPENDENT_AMBULATORY_CARE_PROVIDER_SITE_OTHER): Payer: Commercial Managed Care - HMO

## 2015-04-25 DIAGNOSIS — Z01818 Encounter for other preprocedural examination: Secondary | ICD-10-CM | POA: Insufficient documentation

## 2015-04-25 DIAGNOSIS — Z23 Encounter for immunization: Secondary | ICD-10-CM

## 2015-04-25 DIAGNOSIS — I10 Essential (primary) hypertension: Secondary | ICD-10-CM | POA: Diagnosis not present

## 2015-04-25 HISTORY — DX: Family history of other specified conditions: Z84.89

## 2015-04-25 LAB — CBC
HCT: 40.6 % (ref 35.0–47.0)
Hemoglobin: 13.7 g/dL (ref 12.0–16.0)
MCH: 31.5 pg (ref 26.0–34.0)
MCHC: 33.7 g/dL (ref 32.0–36.0)
MCV: 93.2 fL (ref 80.0–100.0)
PLATELETS: 171 10*3/uL (ref 150–440)
RBC: 4.36 MIL/uL (ref 3.80–5.20)
RDW: 14 % (ref 11.5–14.5)
WBC: 6.8 10*3/uL (ref 3.6–11.0)

## 2015-04-25 NOTE — Patient Instructions (Signed)
  Your procedure is scheduled KC:LEXNTZ April 30, 2015. Report to Same Day Surgery. To find out your arrival time please call [phone] between 1PM - 3PM on Friday April 27, 2015.  Remember: Instructions that are not followed completely may result in serious medical risk, up to and including death, or upon the discretion of your surgeon and anesthesiologist your surgery may need to be rescheduled.    _x___ 1. Do not eat food or drink liquids after midnight. No gum chewing or hard candies.     ____ 2. No Alcohol for 24 hours before or after surgery.   ____ 3. Bring all medications with you on the day of surgery if instructed.    _x___ 4. Notify your doctor if there is any change in your medical condition     (cold, fever, infections).     Do not wear jewelry, make-up, hairpins, clips or nail polish.  Do not wear lotions, powders, or perfumes. You may wear deodorant.  Do not shave 48 hours prior to surgery. Men may shave face and neck.  Do not bring valuables to the hospital.    [facility] is not responsible for any belongings or valuables.               Contacts, dentures or bridgework may not be worn into surgery.  Leave your suitcase in the car. After surgery it may be brought to your room.  For patients admitted to the hospital, discharge time is determined by your treatment team.   Patients discharged the day of surgery will not be allowed to drive home.    Please read over the following fact sheets that you were given:   [facility] Preparing for Surgery  ____ Take these medicines the morning of surgery with A SIP OF WATER: none     ____ Fleet Enema (as directed)   ____ Use CHG Soap as directed  ____ Use inhalers on the day of surgery  ____ Stop metformin 2 days prior to surgery    ____ Take 1/2 of usual insulin dose the night before surgery and none on the morning of surgery.   ____ Stop Coumadin/Plavix/aspirin on  Does not apply.  _x___ Stop  Anti-inflammatories [employer] powder unit after surgery.  May take Tylenol if needed for pain.   _x___ Stop supplements Fish Oil and Cranberry until after surgery.    ____ Bring C-Pap to the hospital.

## 2015-04-26 ENCOUNTER — Telehealth: Payer: Self-pay | Admitting: Family Medicine

## 2015-04-26 LAB — URINE CULTURE

## 2015-04-26 MED ORDER — DEXTROSE 5 % IV SOLN: 280.0000 mg | INTRAVENOUS | Status: AC

## 2015-04-26 MED ORDER — CLINDAMYCIN PHOSPHATE 900 MG/50ML IV SOLN: 900.0000 mg | INTRAVENOUS | Status: AC

## 2015-04-26 NOTE — Telephone Encounter (Signed)
Please call Paula Jimenez about pt  They need surgical clearance She stated they faxed paperwork 11/3

## 2015-04-26 NOTE — OR Nursing (Signed)
As instructed by Dr Amie Critchley request for clearance called and faxed to Dr Glori Bickers and spoke with Vaughan Basta. Aso notified Amy at Dr Junious Silk.

## 2015-04-27 NOTE — Telephone Encounter (Signed)
Dr. Damita Dunnings received paper work and put that he couldn't clear pt before her surgery on 04/30/15 since he has never eval pt and her surgery is the day that Dr. Glori Bickers comes back. Called and let Amy with Urologist office know and she let me know that they rescheduled pt's appt until 05/14/15 to give pt time to see Dr. Glori Bickers. Called pt and scheduled a pre opt clearance appt with her, forms placed in Dr. Marliss Coots inbox

## 2015-04-27 NOTE — Telephone Encounter (Signed)
Notified pt and her daughter of surgery r/s to 11/21 d/t needing cardiac clearance prior to surgery. Advised that all instructions remain the same except the date change. Pt to call Friday prior to surgery for arrival time to [facility]. Pt and daughter voice understanding.

## 2015-04-30 ENCOUNTER — Other Ambulatory Visit: Payer: Self-pay | Admitting: Urology

## 2015-04-30 ENCOUNTER — Other Ambulatory Visit: Payer: Self-pay | Admitting: *Deleted

## 2015-04-30 DIAGNOSIS — Z01818 Encounter for other preprocedural examination: Secondary | ICD-10-CM

## 2015-04-30 DIAGNOSIS — N390 Urinary tract infection, site not specified: Secondary | ICD-10-CM

## 2015-04-30 MED ORDER — CLINDAMYCIN HCL 300 MG PO CAPS: 300.0000 mg | ORAL_CAPSULE | Freq: Three times a day (TID) | ORAL | Status: DC

## 2015-04-30 MED ORDER — CLINDAMYCIN HCL 300 MG PO CAPS
300.0000 mg | ORAL_CAPSULE | Freq: Three times a day (TID) | ORAL | Status: AC
Start: 2015-04-30 — End: 2015-05-14

## 2015-04-30 NOTE — Telephone Encounter (Signed)
Pt notified of script for clindamycin & appt made for pt to return to office on 11/14 for ua & urine culture. Pt & daughter voice understanding.

## 2015-04-30 NOTE — Telephone Encounter (Signed)
Patient will need a repeat UA and culture prior to her rescheduled surgery on the 21 st.  I have sent in a script for Clindamycin to take for now.

## 2015-05-01 ENCOUNTER — Ambulatory Visit (INDEPENDENT_AMBULATORY_CARE_PROVIDER_SITE_OTHER): Payer: Commercial Managed Care - HMO | Admitting: Family Medicine

## 2015-05-01 ENCOUNTER — Encounter: Payer: Self-pay | Admitting: Family Medicine

## 2015-05-01 ENCOUNTER — Ambulatory Visit (INDEPENDENT_AMBULATORY_CARE_PROVIDER_SITE_OTHER)
Admission: RE | Admit: 2015-05-01 | Discharge: 2015-05-01 | Disposition: A | Discharge status: Home / Self Care | Payer: Commercial Managed Care - HMO | Source: Ambulatory Visit | Attending: Family Medicine | Admitting: Family Medicine

## 2015-05-01 VITALS — BP 138/84 | HR 69 | Temp 98.2°F | Ht 65.0 in | Wt 124.2 lb

## 2015-05-01 DIAGNOSIS — R05 Cough: Secondary | ICD-10-CM | POA: Diagnosis not present

## 2015-05-01 DIAGNOSIS — Z01810 Encounter for preprocedural cardiovascular examination: Secondary | ICD-10-CM | POA: Diagnosis not present

## 2015-05-01 DIAGNOSIS — R0602 Shortness of breath: Secondary | ICD-10-CM | POA: Insufficient documentation

## 2015-05-01 DIAGNOSIS — I10 Essential (primary) hypertension: Secondary | ICD-10-CM | POA: Diagnosis not present

## 2015-05-01 NOTE — Progress Notes (Signed)
Subjective:    Patient ID: Paula Jimenez, female    DOB: 05-22-1927, 81 y.o.   MRN: [id number]  HPI Here for pre op clearance  Pt is planning cystoscopy with biopsy with Dr Ashley Murrain 2 spots in bladder and kidney with spot on it  Will be outpatient  Does not think she will have general anesthesia     Keeps a uti   Did EKG on 11/2 Incomplete RBBB L axis deviation  They [facility] a heart [facility] recently ? New?  Has hx of mvp in the past   No hx of heart problems  Was hosp in 1970s once - nothing found except MVP , and did a cath No chest pain or pressure   She only gets short of breath when she is exerting herself (for years-no change)-thinks she is just out of shape  Only coughs if her mouth is dry (daughter thinks she coughs more lately)  bp is stable today (had been high at some of her urology visits)- thinks it was from being anxious  No cp or palpitations or headaches or edema  No side effects to medicines  BP Readings from Last 3 Encounters:  05/01/15 138/84  04/25/15 180/75  04/24/15 178/91     Cholesterol Lab Results  Component Value Date   CHOL 152 10/18/2014   HDL 52.40 10/18/2014   LDLCALC 71 10/18/2014   LDLDIRECT 159.5 02/19/2009   TRIG 143.0 10/18/2014   CHOLHDL 3 10/18/2014   simvastatin   Never smoked   Patient Active Problem List   Diagnosis Date Noted  . Chigger bites 03/05/2015  . Frequent UTI 02/16/2015  . Cough 11/12/2014  . Epistaxis, recurrent 02/28/2014  . History of shingles 02/20/2014  . Dizziness 01/06/2014  . Hypoglycemia 06/22/2013  . Colon cancer screening 11/19/2011  . HEARING LOSS, BILATERAL 09/23/2007  . History of melanoma 02/25/2007  . Hyperlipidemia 02/25/2007  . MITRAL VALVE PROLAPSE 02/25/2007  . ALLERGIC RHINITIS 02/25/2007  . ASTHMA 02/25/2007  . DEGENERATIVE DISC DISEASE, LUMBOSACRAL SPINE 02/25/2007  . OSTEOPOROSIS 02/25/2007  . HYPERTENSION, BENIGN 10/26/2006   Past Medical History  Diagnosis  Date  . Allergy   . Hyperlipidemia   . Osteoporosis   . Hypertension   . Bursitis, hip   . Osteopenia 08/01  . HYPERTENSION, BENIGN 10/26/2006    Qualifier: Diagnosis of  By: Selinda Orion    . MITRAL VALVE PROLAPSE 02/25/2007    Qualifier: History of  By: Marcelino Scot CMA, [place] Bilberry    . Family history of adverse reaction to anesthesia     DAUGHTER had a reaction to ansethesia  . Asthma     years ago, 2-3 times a year pt experiences a problem with breathing.  . Skin cancer 2015    nose   Past Surgical History  Procedure Laterality Date  . Orif radial fracture Left 11/99    arm fracture, radial no surgery  . Breast biopsy Right 06/99    fibrocystic  . Abdominal hysterectomy  1971  . Eye surgery Bilateral 2011    June and August of 2011  . Tonsillectomy     Social History  Substance Use Topics  . Smoking status: Never Smoker   . Smokeless tobacco: None  . Alcohol Use: No   Family History  Problem Relation Age of Onset  . Leukemia Sister   . Cancer Sister     leukemia  . Heart disease Mother   . Diabetes Mother   .  Stroke Father   . Heart failure Mother    Allergies  Allergen Reactions  . Ciprofloxacin     diarrhea  . Codeine   . Levofloxacin   . Nitrofurantoin Nausea Only  . Penicillins     REACTION: mouth swelling  . Raloxifene     REACTION: rash  . Rofecoxib   . Sulfamethoxazole-Trimethoprim     REACTION: unknown  . Tramadol     dizzy  . Valtrex [Valacyclovir Hcl]   . Adhesive [Tape] Rash   Current Outpatient Prescriptions on File Prior to Visit  Medication Sig Dispense Refill  . Alum Hydroxide-Mag Carbonate (GAVISCON EXTRA STRENGTH) 160-105 MG CHEW Chew 1 tablet by mouth as needed.    . Ascorbic Acid (VITAMIN C WITH ROSE HIPS) 1000 MG tablet Take 1,000 mg by mouth daily.    [name] atenolol (TENORMIN) 50 MG tablet Take 1 tablet (50 mg total) by mouth daily. (Patient taking differently: Take 50 mg by mouth at bedtime. ) 30 tablet 11  . benzonatate (TESSALON)  200 MG capsule Take 1 capsule (200 mg total) by mouth 3 (three) times daily as needed. 30 capsule 1  . clindamycin (CLEOCIN) 300 MG capsule Take 1 capsule (300 mg total) by mouth 3 (three) times daily. 45 capsule 0  . Cranberry 1000 MG CAPS Take 84 mg by mouth 2 (two) times daily.     [name] estradiol (ESTRACE) 0.1 MG/GM vaginal cream Place 1 Applicatorful vaginally 2 (two) times a week.    . fish oil-omega-3 fatty acids 1000 MG capsule Take 1 g by mouth daily.      [name] loratadine (CLARITIN) 10 MG tablet Take 10 mg by mouth as needed.     . Multiple Vitamins-Minerals (ONE-A-DAY WOMENS PETITES PO) Take 1 tablet by mouth every morning.    . simvastatin (ZOCOR) 20 MG tablet Take 1 tablet (20 mg total) by mouth daily. (Patient taking differently: Take 20 mg by mouth daily at 6 PM. ) 30 tablet 11  . VITAMIN D, CHOLECALCIFEROL, PO Take 1 tablet by mouth daily. 5000 IU IN AM    . BIOGAIA PROBIOTIC (BIOGAIA PROBIOTIC) LIQD Take by mouth daily.      No current facility-administered medications on file prior to visit.     Review of Systems Review of Systems  Constitutional: Negative for fever, appetite change, fatigue and unexpected weight change.  Eyes: Negative for pain and visual disturbance.  Respiratory: Negative for cough and pos for exertional  shortness of breath.   Cardiovascular: Negative for cp or palpitations    Gastrointestinal: Negative for nausea, diarrhea and constipation.  Genitourinary: Negative for urgency and frequency. neg for hematuria  Skin: Negative for pallor or rash   Neurological: Negative for weakness, light-headedness, numbness and headaches.  Hematological: Negative for adenopathy. Does not bruise/bleed easily.  Psychiatric/Behavioral: Negative for dysphoric mood. The patient is not nervous/anxious.         Objective:   Physical Exam  Constitutional: She appears well-developed and well-nourished. No distress.  Well appearing   HENT:  Head: Normocephalic and atraumatic.    Mouth/Throat: Oropharynx is clear and moist.  Eyes: Conjunctivae and EOM are normal. Pupils are equal, round, and reactive to light.  Neck: Normal range of motion. Neck supple. No JVD present. Carotid bruit is not present. No thyromegaly present.  Cardiovascular: Normal rate, regular rhythm, normal heart sounds and intact distal pulses.  Exam reveals no gallop.   No murmur heard. Of note-no M heard today from MVP  Pulmonary/Chest:  Effort normal and breath sounds normal. No respiratory distress. She has no wheezes. She has no rales.  No crackles  Abdominal: Soft. Bowel sounds are normal. She exhibits no distension, no abdominal bruit and no mass. There is no tenderness.  No suprapubic tenderness or fullness    Musculoskeletal: She exhibits no edema.  Lymphadenopathy:    She has no cervical adenopathy.  Neurological: She is alert. She has normal reflexes.  Skin: Skin is warm and dry. No rash noted.  Psychiatric: She has a normal mood and affect.          Assessment & Plan:   Problem List Items Addressed This Visit      Cardiovascular and Mediastinum   HYPERTENSION, BENIGN - Primary    bp is stable today  No cp or palpitations or headaches or edema  No side effects to medicines  BP Readings from Last 3 Encounters:  05/01/15 138/84  04/25/15 180/75  04/24/15 178/91            Other   Pre-operative cardiovascular examination    Want to get her cleared cardiac wise for abn EKG with hx of sob on exertion Past hx of MVP  Otherwise medically clear for cystoscopy with bx (no hx of anesthesia  problems in the past) cxr today Ref to cardiology      Relevant Orders   Ambulatory referral to Cardiology   SOB (shortness of breath) on exertion    Without chest discomfort Hx of MVP Incomplete RBBB with L axis deviation on EKG cxr today  Ref to cardiol for clearance before her upcoming surgery (cystoscopy with biopsy)      Relevant Orders   DG Chest 2 View (Completed)    Ambulatory referral to Cardiology

## 2015-05-01 NOTE — Progress Notes (Signed)
Pre visit review using our clinic review tool, if applicable. No additional management support is needed unless otherwise documented below in the visit note. 

## 2015-05-01 NOTE — Patient Instructions (Signed)
Chest xray today  Stop at check out for referral to cardiology for your shortness of breath/ to eval you EKG and also for pre op clearance

## 2015-05-02 ENCOUNTER — Telehealth: Payer: Self-pay | Admitting: *Deleted

## 2015-05-02 ENCOUNTER — Encounter: Payer: Self-pay | Admitting: *Deleted

## 2015-05-02 NOTE — Assessment & Plan Note (Signed)
Want to get her cleared cardiac wise for abn EKG with hx of sob on exertion Past hx of MVP  Otherwise medically clear for cystoscopy with bx (no hx of anesthesia  problems in the past) cxr today Ref to cardiology

## 2015-05-02 NOTE — Assessment & Plan Note (Signed)
Without chest discomfort Hx of MVP Incomplete RBBB with L axis deviation on EKG cxr today  Ref to cardiol for clearance before her upcoming surgery (cystoscopy with biopsy)

## 2015-05-02 NOTE — Telephone Encounter (Signed)
Amy from [facility] called to check status for cardiac clearance for upcoming cystoscopy.  Patient was seen in the office on 11/08.  She was referred to cardiology for further evaluation of abnormal EKG.  Appointment scheduled with Dr. Johnsie Cancel 11/11.  Otherwise patient is medically cleared.  Amy notified.  Will update her once cardiac clearance received from cardiology.

## 2015-05-02 NOTE — Assessment & Plan Note (Signed)
bp is stable today  No cp or palpitations or headaches or edema  No side effects to medicines  BP Readings from Last 3 Encounters:  05/01/15 138/84  04/25/15 180/75  04/24/15 178/91

## 2015-05-02 NOTE — Telephone Encounter (Signed)
Thanks-that is correct

## 2015-05-03 NOTE — Progress Notes (Signed)
Patient ID: Paula Jimenez, female   DOB: 07-22-1926, 81 y.o.   MRN: [id number]     Cardiology Office Note   Date:  05/04/2015   ID:  Paula Jimenez, DOB 28-May-1927, MRN [id number]  PCP:  Loura Pardon, MD  Cardiologist:   Jenkins Rouge, MD   Chief Complaint  Patient presents with  . New Evaluation    SOB, no refills      History of Present Illness: Paula Jimenez is a 81 y.o. female who presents for  Evaluation abnormal ECG, murmur and clearance to have cystoscopy with Dr Junious Silk in [place].  History of MVP.  Distant cath many years ago normal.  Chronic exertional dyspnea  CT abdomen done 03/25/15 reviewed  IMPRESSION: 1. Fat containing lesion is in the lower pole of the RIGHT kidney is most consistent with a benign angiomyolipoma. 2. No nephrolithiasis, ureterolithiasis, or obstructive uropathy. 3. Bladder is distended. There is mild hydroureter bilaterally which could indicate reflux or mild obstruction. Consider for bladder outlet obstruction. 4. Sigmoid diverticulosis without evidence of diverticulitis. 5. Atherosclerotic calcification of the abdominal aorta.  She is active for her age.  No chest pain , palpitations or syncope.  No history of MI  Elevated BP on beta blocker  No PND/Orthopnea edema or history of CHF  Past Medical History  Diagnosis Date  . Allergy   . Hyperlipidemia   . Osteoporosis   . Hypertension   . Bursitis, hip   . Osteopenia 08/01  . HYPERTENSION, BENIGN 10/26/2006    Qualifier: Diagnosis of  By: Selinda Orion    . MITRAL VALVE PROLAPSE 02/25/2007    Qualifier: History of  By: Marcelino Scot CMA, [place] Bilberry    . Family history of adverse reaction to anesthesia     DAUGHTER had a reaction to ansethesia  . Asthma     years ago, 2-3 times a year pt experiences a problem with breathing.  . Skin cancer 2015    nose    Past Surgical History  Procedure Laterality Date  . Orif radial fracture Left 11/99    arm fracture, radial no surgery  .  Breast biopsy Right 06/99    fibrocystic  . Abdominal hysterectomy  1971  . Eye surgery Bilateral 2011    June and August of 2011  . Tonsillectomy       Current Outpatient Prescriptions  Medication Sig Dispense Refill  . Alum Hydroxide-Mag Carbonate (GAVISCON EXTRA STRENGTH) 160-105 MG CHEW Chew 1 tablet by mouth daily as needed (for heartburn).     . Ascorbic Acid (VITAMIN C WITH ROSE HIPS) 1000 MG tablet Take 1,000 mg by mouth daily.    [name] atenolol (TENORMIN) 50 MG tablet Take 1 tablet (50 mg total) by mouth daily. 30 tablet 11  . AYR SALINE NASAL NA Place 1 spray into the nose daily as needed (allergies).     . benzonatate (TESSALON) 200 MG capsule Take 200 mg by mouth 3 (three) times daily as needed for cough.    . clindamycin (CLEOCIN) 300 MG capsule Take 1 capsule (300 mg total) by mouth 3 (three) times daily. 45 capsule 0  . Cranberry 1000 MG CAPS Take 84 mg by mouth 2 (two) times daily.     [name] estradiol (ESTRACE) 0.1 MG/GM vaginal cream Place 1 Applicatorful vaginally 2 (two) times a week.    . fish oil-omega-3 fatty acids 1000 MG capsule Take 1 g by mouth daily.      [name] loratadine (CLARITIN) 10  MG tablet Take 10 mg by mouth as needed for allergies or rhinitis.     . Multiple Vitamins-Minerals (ONE-A-DAY WOMENS PETITES PO) Take 1 tablet by mouth every morning.    . simvastatin (ZOCOR) 20 MG tablet Take 1 tablet (20 mg total) by mouth daily. 30 tablet 11  . VITAMIN D, CHOLECALCIFEROL, PO Take 1 tablet by mouth daily. 5000 IU IN AM     No current facility-administered medications for this visit.    Allergies:   Codeine; Levofloxacin; Rofecoxib; Sulfamethoxazole-trimethoprim; Valtrex; Adhesive; Ciprofloxacin; Nitrofurantoin; Penicillins; Raloxifene; and Tramadol    Social History:  The patient  reports that she has never smoked. She does not have any smokeless tobacco history on file. She reports that she does not drink alcohol or use illicit drugs.   Family History:  The patient's  family history includes Cancer in her sister; Diabetes in her mother; Heart disease in her mother; Heart failure in her mother; Leukemia in her sister; Stroke in her father.    ROS:  Please see the history of present illness.   Otherwise, review of systems are positive for none.   All other systems are reviewed and negative.    PHYSICAL EXAM: VS:  BP 160/76 mmHg  Pulse 71  Ht 5\' 5"  (1.651 m)  Wt 56.972 kg (125 lb 9.6 oz)  BMI 20.90 kg/m2 , BMI Body mass index is 20.9 kg/(m^2). Affect appropriate Healthy:  appears stated age 65: normal Neck supple with no adenopathy JVP normal no bruits no thyromegaly Lungs clear with no wheezing and good diaphragmatic motion Heart:  S1/S2 no murmur, no rub, gallop or click PMI normal Abdomen: benighn, BS positve, no tenderness, no AAA no bruit.  No HSM or HJR Distal pulses intact with no bruits No edema Neuro non-focal Skin warm and dry No muscular weakness    EKG:   04/25/15  SR LAD ICRBBB  Rate 64    Recent Labs: 10/18/2014: ALT 15; BUN 12; Potassium 3.8; Sodium 136; TSH 0.56 04/04/2015: Creatinine, Ser 0.70 04/25/2015: Hemoglobin 13.7; Platelets 171    Lipid Panel    Component Value Date/Time   CHOL 152 10/18/2014 1037   TRIG 143.0 10/18/2014 1037   HDL 52.40 10/18/2014 1037   CHOLHDL 3 10/18/2014 1037   VLDL 28.6 10/18/2014 1037   LDLCALC 71 10/18/2014 1037   LDLDIRECT 159.5 02/19/2009 0840      Wt Readings from Last 3 Encounters:  05/04/15 56.972 kg (125 lb 9.6 oz)  05/01/15 56.359 kg (124 lb 4 oz)  04/25/15 56.246 kg (124 lb)      Other studies Reviewed: Additional studies/ records that were reviewed today include: Dr Marliss Coots notes .    ASSESSMENT AND PLAN:  1.  Preop: Good functional capacity no history of heart issues and no murmur on exam clear to have cystoscopy 2. Dyspnea: normal cardiopulmonary exam f/u echo given history of murmur/MVP 3. Abnormal ECG:  Mild conduction disease f/u echo yearly  ECG   Current medicines are reviewed at length with the patient today.  The patient does not have concerns regarding medicines.  The following changes have been made:  no change  Labs/ tests ordered today include: Echo    Orders Placed This Encounter  Procedures  . Echocardiogram     Disposition:   FU with me PRN      Signed, Jenkins Rouge, MD  05/04/2015 10:16 AM    [address], [place], [place]  [address] Phone: [phone]; Fax: [phone]

## 2015-05-04 ENCOUNTER — Encounter: Payer: Self-pay | Admitting: Cardiovascular Disease

## 2015-05-04 ENCOUNTER — Ambulatory Visit (INDEPENDENT_AMBULATORY_CARE_PROVIDER_SITE_OTHER): Payer: Commercial Managed Care - HMO | Admitting: Cardiovascular Disease

## 2015-05-04 VITALS — BP 160/76 | HR 71 | Ht 65.0 in | Wt 125.6 lb

## 2015-05-04 DIAGNOSIS — R0602 Shortness of breath: Secondary | ICD-10-CM | POA: Diagnosis not present

## 2015-05-04 NOTE — Patient Instructions (Addendum)
Medication Instructions:  Your physician recommends that you continue on your current medications as directed. Please refer to the Current Medication list given to you today.  Labwork: NONE  Testing/Procedures: Your physician has requested that you have an echocardiogram. Echocardiography is a painless test that uses sound waves to create images of your heart. It provides your doctor with information about the size and shape of your heart and how well your heart's chambers and valves are working. This procedure takes approximately one hour. There are no restrictions for this procedure.  Follow-Up: Your physician wants you to follow-up as needed.  If you need a refill on your cardiac medications before your next appointment, please call your pharmacy.

## 2015-05-07 ENCOUNTER — Other Ambulatory Visit: Payer: Commercial Managed Care - HMO

## 2015-05-07 ENCOUNTER — Ambulatory Visit (INDEPENDENT_AMBULATORY_CARE_PROVIDER_SITE_OTHER): Payer: Commercial Managed Care - HMO

## 2015-05-07 ENCOUNTER — Other Ambulatory Visit: Payer: Self-pay

## 2015-05-07 DIAGNOSIS — N390 Urinary tract infection, site not specified: Secondary | ICD-10-CM | POA: Diagnosis not present

## 2015-05-07 DIAGNOSIS — Z01818 Encounter for other preprocedural examination: Secondary | ICD-10-CM

## 2015-05-07 DIAGNOSIS — R0602 Shortness of breath: Secondary | ICD-10-CM

## 2015-05-07 LAB — URINALYSIS, COMPLETE
Bilirubin, UA: NEGATIVE
GLUCOSE, UA: NEGATIVE
KETONES UA: NEGATIVE
NITRITE UA: NEGATIVE
Protein, UA: NEGATIVE
RBC, UA: NEGATIVE
SPEC GRAV UA: 1.01 (ref 1.005–1.030)
UUROB: 0.2 mg/dL (ref 0.2–1.0)
pH, UA: 6.5 (ref 5.0–7.5)

## 2015-05-07 LAB — MICROSCOPIC EXAMINATION: RBC MICROSCOPIC, UA: NONE SEEN /HPF (ref 0–?)

## 2015-05-07 NOTE — Telephone Encounter (Signed)
Notified pt's daughter that per Zara Council we will wait to prescribe a different antibiotic until the urine culture collected 05/07/15 results. Daughter voices understanding.

## 2015-05-07 NOTE — Addendum Note (Signed)
Addended by: Toniann Fail C on: 05/07/2015 03:26 PM   Modules accepted: Orders

## 2015-05-07 NOTE — Telephone Encounter (Signed)
Pt's daughter, [name], called stating pt had to stop taking clindamycin over the weekend due to throat swelling. Please advise.

## 2015-05-08 ENCOUNTER — Other Ambulatory Visit (HOSPITAL_COMMUNITY): Payer: Commercial Managed Care - HMO

## 2015-05-09 NOTE — OR Nursing (Signed)
Cleared by Dr Johnsie Cancel 05/03/15

## 2015-05-10 ENCOUNTER — Telehealth: Payer: Self-pay | Admitting: Radiology

## 2015-05-10 ENCOUNTER — Other Ambulatory Visit: Payer: Self-pay | Admitting: Urology

## 2015-05-10 DIAGNOSIS — N3000 Acute cystitis without hematuria: Secondary | ICD-10-CM

## 2015-05-10 LAB — CULTURE, URINE COMPREHENSIVE

## 2015-05-10 MED ORDER — CIPROFLOXACIN HCL 500 MG PO TABS: 500.0000 mg | ORAL_TABLET | Freq: Two times a day (BID) | ORAL | Status: DC

## 2015-05-10 NOTE — Telephone Encounter (Signed)
Pt's daughter notified that script for Cipro was sent to pharmacy because of positive urine culture. Per Dr Erlene Quan pt should take imodium for diarrhea if she has this side effect. Advised that we will proceed with surgery on 11/21 as long as pt takes the antibiotic. Daughter voices understanding.

## 2015-05-14 ENCOUNTER — Telehealth: Payer: Self-pay | Admitting: Urology

## 2015-05-14 ENCOUNTER — Ambulatory Visit: Payer: Commercial Managed Care - HMO | Admitting: *Deleted

## 2015-05-14 ENCOUNTER — Encounter
Admission: RE | Disposition: A | Discharge status: Home / Self Care | Payer: Self-pay | Source: Ambulatory Visit | Attending: Urology

## 2015-05-14 ENCOUNTER — Ambulatory Visit
Admission: RE | Admit: 2015-05-14 | Discharge: 2015-05-14 | Disposition: A | Discharge status: Home / Self Care | Payer: Commercial Managed Care - HMO | Source: Ambulatory Visit | Attending: Urology | Admitting: Urology

## 2015-05-14 ENCOUNTER — Encounter: Payer: Self-pay | Admitting: *Deleted

## 2015-05-14 DIAGNOSIS — Z539 Procedure and treatment not carried out, unspecified reason: Secondary | ICD-10-CM | POA: Diagnosis not present

## 2015-05-14 DIAGNOSIS — N329 Bladder disorder, unspecified: Secondary | ICD-10-CM | POA: Diagnosis not present

## 2015-05-14 DIAGNOSIS — R310 Gross hematuria: Secondary | ICD-10-CM | POA: Diagnosis not present

## 2015-05-14 LAB — URINALYSIS COMPLETE WITH MICROSCOPIC (ARMC ONLY)
BILIRUBIN URINE: NEGATIVE
GLUCOSE, UA: NEGATIVE mg/dL
KETONES UR: NEGATIVE mg/dL
NITRITE: NEGATIVE
PH: 6 (ref 5.0–8.0)
Protein, ur: NEGATIVE mg/dL
Specific Gravity, Urine: 1.014 (ref 1.005–1.030)
Trans Epithel, UA: <1

## 2015-05-14 SURGERY — CYSTOSCOPY, WITH BIOPSY
Anesthesia: General

## 2015-05-14 MED ORDER — LACTATED RINGERS IV SOLN
INTRAVENOUS | Status: DC
  Administered 2015-05-14: 10:00:00 via INTRAVENOUS

## 2015-05-14 MED ORDER — LIDOCAINE HCL (PF) 1 % IJ SOLN
INTRAMUSCULAR | Status: AC
  Filled 2015-05-14: qty 2

## 2015-05-14 MED ORDER — GENTAMICIN SULFATE 40 MG/ML IJ SOLN
280.0000 mg | Freq: Once | INTRAVENOUS | Status: DC
  Filled 2015-05-14: qty 7

## 2015-05-14 MED ORDER — FAMOTIDINE 20 MG PO TABS
ORAL_TABLET | ORAL | Status: AC
  Filled 2015-05-14: qty 1

## 2015-05-14 MED ORDER — CEFAZOLIN SODIUM-DEXTROSE 2-3 GM-% IV SOLR
INTRAVENOUS | Status: AC
  Filled 2015-05-14: qty 50

## 2015-05-14 MED ORDER — FAMOTIDINE 20 MG PO TABS
20.0000 mg | ORAL_TABLET | Freq: Once | ORAL | Status: AC
  Administered 2015-05-14: 20 mg via ORAL

## 2015-05-14 SURGICAL SUPPLY — 15 items
BAG DRAIN CYSTO-URO LG1000N (MISCELLANEOUS) ×3 IMPLANT
CORD URO TURP 10FT (MISCELLANEOUS) ×3 IMPLANT
GLOVE BIO SURGEON STRL SZ 6.5 (GLOVE) ×2 IMPLANT
GLOVE BIO SURGEON STRL SZ7 (GLOVE) ×6 IMPLANT
GLOVE BIO SURGEONS STRL SZ 6.5 (GLOVE) ×1
GOWN STRL REUS W/ TWL LRG LVL3 (GOWN DISPOSABLE) ×2 IMPLANT
GOWN STRL REUS W/TWL LRG LVL3 (GOWN DISPOSABLE) ×6
KIT RM TURNOVER CYSTO AR (KITS) ×3 IMPLANT
PACK CYSTO AR (MISCELLANEOUS) ×3 IMPLANT
PAD GROUND ADULT SPLIT (MISCELLANEOUS) ×3 IMPLANT
PREP PVP WINGED SPONGE (MISCELLANEOUS) ×3 IMPLANT
SET CYSTO W/LG BORE CLAMP LF (SET/KITS/TRAYS/PACK) ×3 IMPLANT
SURGILUBE 2OZ TUBE FLIPTOP (MISCELLANEOUS) ×3 IMPLANT
WATER STERILE IRR 1000ML POUR (IV SOLUTION) ×3 IMPLANT
WATER STERILE IRR 3000ML UROMA (IV SOLUTION) ×3 IMPLANT

## 2015-05-14 NOTE — Progress Notes (Signed)
IV d/c'd by Marijean Niemann, RN

## 2015-05-14 NOTE — Telephone Encounter (Signed)
Surgery scheduled for today canceled due to concern for ongoing UTI. UA appeared contaminated but unable to rule out infection. After lengthy discussion with the patient and her daughter, prefer to defer biopsy for a week or 2 to ensure infection has cleared.  Plan to have patient come into the office tomorrow for a catheterized urine specimen.  Surgery to be rescheduled in 1-2 weeks pending the above.  Hollice Espy, MD

## 2015-05-14 NOTE — Progress Notes (Signed)
Discussed abx order for Clindamycin and Gentamycin with Dr  Erlene Quan when in to see pt, notified her of pt's allergy to Clindamycin, advises will just cover pt with one abx Gentamycin for surgery, since pt's been on Cipro at home

## 2015-05-14 NOTE — Progress Notes (Signed)
Dr Erlene Quan in to see pt 28 am and discussed UA results w/pt & dgtr, decision made to reschedule for a later date, pt to go to MD office tomorrow to give another urine specimen for UA

## 2015-05-14 NOTE — Anesthesia Preprocedure Evaluation (Deleted)
Anesthesia Evaluation  Patient identified by MRN, date of birth, ID band Patient awake    Reviewed: Allergy & Precautions, NPO status , Patient's Chart, lab work & pertinent test results  History of Anesthesia Complications (+) AWARENESS UNDER ANESTHESIA  Airway Mallampati: I  TM Distance: >3 FB Neck ROM: Full    Dental  (+) Upper Dentures, Lower Dentures   Pulmonary shortness of breath and with exertion, asthma ,    Pulmonary exam normal        Cardiovascular Exercise Tolerance: Good hypertension, Pt. on medications and Pt. on home beta blockers Normal cardiovascular exam     Neuro/Psych    GI/Hepatic   Endo/Other    Renal/GU      Musculoskeletal  (+) Arthritis , Osteoarthritis,    Abdominal Normal abdominal exam  (+)  Abdomen: soft.    Peds  Hematology   Anesthesia Other Findings   Reproductive/Obstetrics                            Anesthesia Physical Anesthesia Plan  ASA: II  Anesthesia Plan: General   Post-op Pain Management:    Induction: Intravenous  Airway Management Planned: LMA  Additional Equipment:   Intra-op Plan:   Post-operative Plan: Extubation in OR  Informed Consent: I have reviewed the patients History and Physical, chart, labs and discussed the procedure including the risks, benefits and alternatives for the proposed anesthesia with the patient or authorized representative who has indicated his/her understanding and acceptance.     Plan Discussed with: CRNA  Anesthesia Plan Comments:         Anesthesia Quick Evaluation

## 2015-05-14 NOTE — H&P (Signed)
05/14/2015  10:19 AM   Laural Roes [birth year] [id number]  H&P  HPI: 81 yo F with gross hematuria. 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 She's had no further gross hematuria. She said no dysuria. On the CT scan she had a distended bladder but prior PVR was reported as normal. Her kidney function has remained normal with a creatinine of 0.6-0.7.  On cystoscopy the urethra appeared normal, at the bladder neck there are 2 cystic lesions which appear to be areas of cystitis glandularis. The trigone and ureteral orifices appeared normal with clear efflux. The remainder of the bladder mucosa appeared normal. There was no papillary tumor. No stone or foreign body in the bladder.   PMH: Past Medical History  Diagnosis Date  . Allergy   . Hyperlipidemia   . Osteoporosis   . Hypertension   . Bursitis, hip   . Osteopenia 08/01  . HYPERTENSION, BENIGN 10/26/2006    Qualifier: Diagnosis of  By: Selinda Orion    . MITRAL VALVE PROLAPSE 02/25/2007    Qualifier: History of  By: Marcelino Scot CMA, [place]    . Family history of adverse reaction to anesthesia     DAUGHTER had a reaction to ansethesia  . Asthma     years ago, 2-3 times a year pt experiences a problem with breathing.  . Skin cancer 2015    nose    Surgical History: Past Surgical History  Procedure Laterality Date  . Orif radial fracture Left 11/99    arm fracture, radial no surgery  . Breast biopsy Right 06/99    fibrocystic  . Abdominal hysterectomy  1971  . Eye surgery Bilateral 2011    June and August of 2011  . Tonsillectomy      Home Medications:    Medication List    ASK your doctor about these medications        atenolol 50 MG tablet  Commonly known as:  TENORMIN  Take 1 tablet (50 mg total) by mouth daily.     AYR SALINE NASAL NA  Place 1 spray into the nose daily as needed (allergies).     benzonatate 200 MG capsule  Commonly known as:  TESSALON    Take 200 mg by mouth 3 (three) times daily as needed for cough.     ciprofloxacin 500 MG tablet  Commonly known as:  CIPRO  Take 1 tablet (500 mg total) by mouth every 12 (twelve) hours.     clindamycin 300 MG capsule  Commonly known as:  CLEOCIN  Take 1 capsule (300 mg total) by mouth 3 (three) times daily.     Cranberry 1000 MG Caps  Take 84 mg by mouth 2 (two) times daily.     estradiol 0.1 MG/GM vaginal cream  Commonly known as:  ESTRACE  Place 1 Applicatorful vaginally 2 (two) times a week.     fish oil-omega-3 fatty acids 1000 MG capsule  Take 1 g by mouth daily.     GAVISCON EXTRA STRENGTH 160-105 MG Chew  Generic drug:  Alum Hydroxide-Mag Carbonate  Chew 1 tablet by mouth daily as needed (for heartburn).     loperamide 2 MG capsule  Commonly known as:  IMODIUM  Take 2 mg by mouth as needed for diarrhea or loose stools (has been taking with Clindamycin).     loratadine 10 MG tablet  Commonly known as:  CLARITIN  Take 10 mg by mouth as  needed for allergies or rhinitis.     ONE-A-DAY WOMENS PETITES PO  Take 1 tablet by mouth every morning.     simvastatin 20 MG tablet  Commonly known as:  ZOCOR  Take 1 tablet (20 mg total) by mouth daily.     vitamin C with rose hips 1000 MG tablet  Take 1,000 mg by mouth daily.     VITAMIN D (CHOLECALCIFEROL) PO  Take 1 tablet by mouth daily. 5000 IU IN AM        Allergies:  Allergies  Allergen Reactions  . Clindamycin/Lincomycin Swelling    Throat swelling  . Codeine     Unknown per pt   . Levofloxacin     Unknown per pt   . Rofecoxib     Unknown per pt   . Sulfamethoxazole-Trimethoprim     REACTION: unknown  . Valtrex [Valacyclovir Hcl]     Unknown per pt   . Adhesive [Tape] Rash  . Ciprofloxacin Diarrhea  . Nitrofurantoin Nausea Only  . Penicillins Swelling    mouth swelling  . Raloxifene Rash  . Tramadol     dizzy    Family History: Family History  Problem Relation Age of Onset  . Leukemia  Sister   . Cancer Sister     leukemia  . Heart disease Mother   . Diabetes Mother   . Stroke Father   . Heart failure Mother     Social History:  reports that she has never smoked. She does not have any smokeless tobacco history on file. She reports that she does not drink alcohol or use illicit drugs.  ROS: 12 point ROS negative   Physical Exam: BP 192/88 mmHg  Pulse 78  Temp(Src) 96.4 F (35.8 C) (Oral)  Resp 16  Ht 5\' 5"  (1.651 m)  Wt 125 lb (56.7 kg)  BMI 20.80 kg/m2  SpO2 99%  Constitutional:  Alert and oriented, No acute distress. HEENT: [place] AT, moist mucus membranes.  Trachea midline, no masses. Cardiovascular: No clubbing, cyanosis, or edema. RRR. Respiratory: Normal respiratory effort, no increased work of breathing. CTAB. GI: Abdomen is soft, nontender, nondistended, no abdominal masses GU: No CVA tenderness.  Skin: No rashes, bruises or suspicious lesions. Neurologic: Grossly intact, no focal deficits, moving all 4 extremities. Psychiatric: Normal mood and affect.  Laboratory Data: Lab Results  Component Value Date   WBC 6.8 04/25/2015   HGB 13.7 04/25/2015   HCT 40.6 04/25/2015   MCV 93.2 04/25/2015   PLT 171 04/25/2015    Lab Results  Component Value Date   CREATININE 0.70 04/04/2015    Urinalysis    Component Value Date/Time   COLORURINE yellow 09/02/2010 1003   APPEARANCEUR Clear 09/02/2010 1003   LABSPEC 1.010 09/02/2010 1003   PHURINE 6.0 09/02/2010 1003   GLUCOSEU Negative 05/07/2015 1533   HGBUR trace-lysed 09/02/2010 1003   BILIRUBINUR Negative 05/07/2015 1533   BILIRUBINUR negative 03/14/2015 1909   BILIRUBINUR Neg. 02/16/2015 1016   BILIRUBINUR negative 09/02/2010 1003   KETONESUR negative 03/14/2015 1909   PROTEINUR 30 mg/dL 02/16/2015 1016   UROBILINOGEN 0.2 03/14/2015 1909   UROBILINOGEN 0.2 09/02/2010 1003   NITRITE Negative 05/07/2015 1533   NITRITE Negative 03/14/2015 1909   NITRITE Neg. 02/16/2015 1016   NITRITE negative  09/02/2010 1003   LEUKOCYTESUR Trace* 05/07/2015 1533   LEUKOCYTESUR large (3+)* 03/14/2015 1909    Pertinent Imaging: ADDENDUM REPORT: 04/04/2015 16:15 ADDENDUM: Upon further consideration, mild ureteral dilatation is likely within normal limits and  relates to IV hydration. Bladder outlet obstruction unlikely. Electronically Signed  By: Suzy Bouchard M.D.  On: 04/04/2015 16:15     Study Result     CLINICAL DATA: Gross hematuria for 20 years. No history kidney stones.  EXAM: CT ABDOMEN AND PELVIS WITHOUT AND WITH CONTRAST  TECHNIQUE: Multidetector CT imaging of the abdomen and pelvis was performed following the standard protocol before and following the bolus administration of intravenous contrast.  CONTRAST: 120mL OMNIPAQUE IOHEXOL 300 MG/ML SOLN  COMPARISON: None.  FINDINGS: Lower chest: Lung bases are clear.  Hepatobiliary: No focal hepatic lesion. No biliary duct dilatation. Gallbladder is normal. Common bile duct is normal.  Pancreas: Pancreas is normal. No ductal dilatation. No pancreatic inflammation.  Spleen: Normal spleen  Adrenals/urinary tract: Adrenal glands are normal. Fat containing lesion exam from the lower pole of the RIGHT kidney measures 22 by 14 mm (image 46, series 4). No nephrolithiasis or ureterolithiasis. Cortical phase imaging demonstrates mild peripheral enhancement of the fat containing lesion lower pole of the RIGHT kidney. No additional enhancing lesions are present. Delayed pyelogram phase imaging demonstrates no filling defects within the collecting systems or ureters. There is mild bilateral hydroureter.  They bladder is moderately distended. No filling defect within the bladder. No enhancing lesion.  Stomach/Bowel: Stomach small bowel are normal. Cecum is normal. There are diverticula descending colon sigmoid colon without acute inflammation. Rectum appears normal.  Vascular/Lymphatic: Abdominal aorta  is normal caliber with atherosclerotic calcification. There is no retroperitoneal or periportal lymphadenopathy. No pelvic lymphadenopathy.  Reproductive: Post hysterectomy anatomy.  Musculoskeletal: No aggressive osseous lesion.  Other: No free fluid.  IMPRESSION: 1. Fat containing lesion is in the lower pole of the RIGHT kidney is most consistent with a benign angiomyolipoma. 2. No nephrolithiasis, ureterolithiasis, or obstructive uropathy. 3. Bladder is distended. There is mild hydroureter bilaterally which could indicate reflux or mild obstruction. Consider for bladder outlet obstruction. 4. Sigmoid diverticulosis without evidence of diverticulitis. 5. Atherosclerotic calcification of the abdominal aorta.  Electronically Signed: By: Suzy Bouchard M.D. On: 04/04/2015 15:49      Assessment & Plan:    #1 recurrent UTI, possible bladder distention-will need to monitor PVR in the future, treat for positive preop urine culture  #2 bladder neoplasm-likely benign cystic lesions at the bladder neck 1 need biopsy and fulguration. I discussed with the patient and her daughter the nature risks benefits and alternatives to cystoscopy with bladder biopsy. All questions answered. She elected to proceed. Urine was sent for culture.  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 Discussed more benign appearing features of this lesion which contains fat and is likely an AML. Will reimage in about 6 months and if stable can image yearly.  Hollice Espy, MD  [facility] [address] [place], [place] [address] [phone]

## 2015-05-15 ENCOUNTER — Telehealth: Payer: Self-pay

## 2015-05-15 ENCOUNTER — Ambulatory Visit (INDEPENDENT_AMBULATORY_CARE_PROVIDER_SITE_OTHER): Payer: Commercial Managed Care - HMO

## 2015-05-15 DIAGNOSIS — N390 Urinary tract infection, site not specified: Secondary | ICD-10-CM | POA: Diagnosis not present

## 2015-05-15 NOTE — Telephone Encounter (Signed)
She should complete her course of antibiotics previously prescribed. I will call her over the holiday weekend if we need to treat with a different or longer course of antibiotics. If she does not hear from [place], everything looks good.  Hollice Espy, MD

## 2015-05-15 NOTE — Patient Instructions (Addendum)
  Your procedure is scheduled on: 05-31-15 Report to [facility] To find out your arrival time please call [phone] between 1PM - 3PM on 05-30-15  Remember: Instructions that are not followed completely may result in serious medical risk, up to and including death, or upon the discretion of your surgeon and anesthesiologist your surgery may need to be rescheduled.    _X___ 1. Do not eat food or drink liquids after midnight. No gum chewing or hard candies.     _X___ 2. No Alcohol for 24 hours before or after surgery.   ____ 3. Bring all medications with you on the day of surgery if instructed.    ____ 4. Notify your doctor if there is any change in your medical condition     (cold, fever, infections).     Do not wear jewelry, make-up, hairpins, clips or nail polish.  Do not wear lotions, powders, or perfumes. You may wear deodorant.  Do not shave 48 hours prior to surgery. Men may shave face and neck.  Do not bring valuables to the hospital.    [facility] is not responsible for any belongings or valuables.               Contacts, dentures or bridgework may not be worn into surgery.  Leave your suitcase in the car. After surgery it may be brought to your room.  For patients admitted to the hospital, discharge time is determined by your  treatment team.   Patients discharged the day of surgery will not be allowed to drive home.   Please read over the following fact sheets that you were given:     ____ Take these medicines the morning of surgery with A SIP OF WATER:    1. NONE  2.   3.   4.  5.  6.  ____ Fleet Enema (as directed)   ____ Use CHG Soap as directed  ____ Use inhalers on the day of surgery  ____ Stop metformin 2 days prior to surgery    ____ Take 1/2 of usual insulin dose the night before surgery and none on the morning of surgery.   ____ Stop Coumadin/Plavix/aspirin-N/A  ____ Stop Anti-inflammatories-NO NSAIDS OR ASA  PRODUCTS-TYLENOL OK   _X___ Stop supplements until after surgery-STOP FISH OIL, CRANBERRY AND VITAMIN C NOW   ____ Bring C-Pap to the hospital.

## 2015-05-15 NOTE — Progress Notes (Signed)
In and Out Catheterization  Patient is present today for a I & O catheterization due to possible UTI. Patient was cleaned and prepped in a sterile fashion with betadine and Lidocaine 2% jelly was instilled into the urethra.  A 14FR cath was inserted no complications were noted , 22ml of urine return was noted, urine was clear and yellow in color. A clean urine sample was collected for cx. Bladder was drained  And catheter was removed with out difficulty.    Preformed by: Toniann Fail, LPN

## 2015-05-15 NOTE — Telephone Encounter (Signed)
Pt came in today for cath specimen. Pt voiced concern of completing abx over the holiday. Please advise.

## 2015-05-15 NOTE — Telephone Encounter (Signed)
Called pt's house & spoke to her daughter.  Gave the message from Dr. Erlene Quan and the daughter understands.

## 2015-05-17 LAB — CULTURE, URINE COMPREHENSIVE

## 2015-05-23 ENCOUNTER — Telehealth: Payer: Self-pay | Admitting: Cardiovascular Disease

## 2015-05-23 NOTE — Telephone Encounter (Signed)
Per Dr. Johnsie Cancel,  patient is clear for surgery. According to last office note (05/04/15), "Preop: Good functional capacity no history of heart issues and no murmur on exam clear to have cystoscopy", and patient is to follow-up with our office PRN. Called Amy with [facility]. Amy stated that they are in EPIC, and she can see this encounter. No fax sent at this time.

## 2015-05-23 NOTE — Telephone Encounter (Signed)
[employer]  Amy from [facility] faxing clearance today 05/23/15  Request for surgical clearance:  1. What type of surgery is being performed? Cysto-blatter biopsy   2. When is this surgery scheduled? 05/30/15   3. Are there any medications that need to be held prior to surgery and how long? n/a   4. Name of physician performing surgery? Hollice Espy   5. What is your office phone and fax number? [phone]  6.

## 2015-05-24 NOTE — Telephone Encounter (Signed)
Notified pt of surgery scheduled 05/31/15, pre-admit testing phone interview on 12/2 between 1-5pm and to call day prior to surgery for arrival time to [facility]. Pt advised to be npo after mn day of surgery. Pt voices understanding.

## 2015-05-25 ENCOUNTER — Encounter: Payer: Self-pay | Admitting: *Deleted

## 2015-05-25 ENCOUNTER — Inpatient Hospital Stay: Admission: RE | Admit: 2015-05-25 | Payer: Commercial Managed Care - HMO | Source: Ambulatory Visit

## 2015-05-30 NOTE — Pre-Procedure Instructions (Signed)
SPOKE WITH AMY AT DR Audree Bane OFFICE REGARDING CLINDAMYCIN ALLERGY (THROAT SWELLING) AND DR Erlene Quan ORDERING CLINDAMYCIN IV PREOP- DR Erlene Quan WANTS TO USE GENTAMICIN IV ONLY AND DC CLINDAMYCIN PER AMY

## 2015-05-30 NOTE — Patient Instructions (Signed)
  Your procedure is scheduled on: 05-31-15 Report to [facility] To find out your arrival time please call [phone] between 1PM - 3PM on 05-30-15  Remember: Instructions that are not followed completely may result in serious medical risk, up to and including death, or upon the discretion of your surgeon and anesthesiologist your surgery may need to be rescheduled.    _X___ 1. Do not eat food or drink liquids after midnight. No gum chewing or hard candies.     _X___ 2. No Alcohol for 24 hours before or after surgery.   ____ 3. Bring all medications with you on the day of surgery if instructed.    _X___ 4. Notify your doctor if there is any change in your medical condition     (cold, fever, infections).     Do not wear jewelry, make-up, hairpins, clips or nail polish.  Do not wear lotions, powders, or perfumes. You may wear deodorant.  Do not shave 48 hours prior to surgery. Men may shave face and neck.  Do not bring valuables to the hospital.    [facility] is not responsible for any belongings or valuables.               Contacts, dentures or bridgework may not be worn into surgery.  Leave your suitcase in the car. After surgery it may be brought to your room.  For patients admitted to the hospital, discharge time is determined by your treatment team.   Patients discharged the day of surgery will not be allowed to drive home.   Please read over the following fact sheets that you were given:      ____ Take these medicines the morning of surgery with A SIP OF WATER:    1. NONE  2.   3.   4.  5.  6.  ____ Fleet Enema (as directed)   ____ Use CHG Soap as directed  ____ Use inhalers on the day of surgery  ____ Stop metformin 2 days prior to surgery    ____ Take 1/2 of usual insulin dose the night before surgery and none on the morning of surgery.   ____ Stop Coumadin/Plavix/aspirin-N/A  ____ Stop Anti-inflammatories-NO NSAIDS OR ASA  PRODUCTS-TYLENOL OK   _X___ Stop supplements until after surgery-STOP FISH OIL, CRANBERRY AND VIT C NOW   ____ Bring C-Pap to the hospital.

## 2015-05-31 ENCOUNTER — Ambulatory Visit: Payer: Commercial Managed Care - HMO | Admitting: Anesthesiology

## 2015-05-31 ENCOUNTER — Encounter
Admission: RE | Disposition: A | Discharge status: Home / Self Care | Payer: Self-pay | Source: Ambulatory Visit | Attending: Urology

## 2015-05-31 ENCOUNTER — Ambulatory Visit
Admission: RE | Admit: 2015-05-31 | Discharge: 2015-05-31 | Disposition: A | Discharge status: Home / Self Care | Payer: Commercial Managed Care - HMO | Source: Ambulatory Visit | Attending: Urology | Admitting: Urology

## 2015-05-31 ENCOUNTER — Encounter: Payer: Self-pay | Admitting: *Deleted

## 2015-05-31 DIAGNOSIS — Z79899 Other long term (current) drug therapy: Secondary | ICD-10-CM | POA: Insufficient documentation

## 2015-05-31 DIAGNOSIS — I341 Nonrheumatic mitral (valve) prolapse: Secondary | ICD-10-CM | POA: Diagnosis not present

## 2015-05-31 DIAGNOSIS — M8580 Other specified disorders of bone density and structure, unspecified site: Secondary | ICD-10-CM | POA: Insufficient documentation

## 2015-05-31 DIAGNOSIS — N3081 Other cystitis with hematuria: Secondary | ICD-10-CM | POA: Insufficient documentation

## 2015-05-31 DIAGNOSIS — J45909 Unspecified asthma, uncomplicated: Secondary | ICD-10-CM | POA: Diagnosis not present

## 2015-05-31 DIAGNOSIS — E785 Hyperlipidemia, unspecified: Secondary | ICD-10-CM | POA: Insufficient documentation

## 2015-05-31 DIAGNOSIS — I10 Essential (primary) hypertension: Secondary | ICD-10-CM | POA: Insufficient documentation

## 2015-05-31 DIAGNOSIS — Z85828 Personal history of other malignant neoplasm of skin: Secondary | ICD-10-CM | POA: Diagnosis not present

## 2015-05-31 DIAGNOSIS — R3129 Other microscopic hematuria: Secondary | ICD-10-CM | POA: Diagnosis present

## 2015-05-31 DIAGNOSIS — R311 Benign essential microscopic hematuria: Secondary | ICD-10-CM | POA: Diagnosis not present

## 2015-05-31 DIAGNOSIS — Z885 Allergy status to narcotic agent status: Secondary | ICD-10-CM | POA: Diagnosis not present

## 2015-05-31 DIAGNOSIS — Z91048 Other nonmedicinal substance allergy status: Secondary | ICD-10-CM | POA: Insufficient documentation

## 2015-05-31 DIAGNOSIS — N390 Urinary tract infection, site not specified: Secondary | ICD-10-CM

## 2015-05-31 DIAGNOSIS — Z888 Allergy status to other drugs, medicaments and biological substances status: Secondary | ICD-10-CM | POA: Diagnosis not present

## 2015-05-31 DIAGNOSIS — Z880 Allergy status to penicillin: Secondary | ICD-10-CM | POA: Diagnosis not present

## 2015-05-31 DIAGNOSIS — M810 Age-related osteoporosis without current pathological fracture: Secondary | ICD-10-CM | POA: Insufficient documentation

## 2015-05-31 DIAGNOSIS — N3021 Other chronic cystitis with hematuria: Secondary | ICD-10-CM | POA: Diagnosis not present

## 2015-05-31 DIAGNOSIS — N329 Bladder disorder, unspecified: Secondary | ICD-10-CM | POA: Diagnosis not present

## 2015-05-31 DIAGNOSIS — Z881 Allergy status to other antibiotic agents status: Secondary | ICD-10-CM | POA: Diagnosis not present

## 2015-05-31 DIAGNOSIS — D494 Neoplasm of unspecified behavior of bladder: Secondary | ICD-10-CM | POA: Diagnosis not present

## 2015-05-31 DIAGNOSIS — R310 Gross hematuria: Secondary | ICD-10-CM | POA: Diagnosis not present

## 2015-05-31 HISTORY — PX: CYSTOSCOPY WITH BIOPSY: SHX5122

## 2015-05-31 SURGERY — CYSTOSCOPY, WITH BIOPSY
Anesthesia: General

## 2015-05-31 MED ORDER — EPHEDRINE SULFATE 50 MG/ML IJ SOLN
INTRAMUSCULAR | Status: DC | PRN
  Administered 2015-05-31: 10 mg via INTRAVENOUS
  Administered 2015-05-31: 5 mg via INTRAVENOUS

## 2015-05-31 MED ORDER — LIDOCAINE HCL (CARDIAC) 20 MG/ML IV SOLN
INTRAVENOUS | Status: DC | PRN
  Administered 2015-05-31: 80 mg via INTRAVENOUS

## 2015-05-31 MED ORDER — GENTAMICIN IN SALINE 1.6-0.9 MG/ML-% IV SOLN
80.0000 mg | Freq: Once | INTRAVENOUS | Status: AC
  Administered 2015-05-31: 80 mg via INTRAVENOUS
  Filled 2015-05-31: qty 50

## 2015-05-31 MED ORDER — ONDANSETRON HCL 4 MG/2ML IJ SOLN
INTRAMUSCULAR | Status: DC | PRN
  Administered 2015-05-31: 4 mg via INTRAVENOUS

## 2015-05-31 MED ORDER — GENTAMICIN SULFATE 40 MG/ML IJ SOLN
80.0000 mg | Freq: Once | INTRAVENOUS | Status: DC
  Filled 2015-05-31: qty 2

## 2015-05-31 MED ORDER — PHENAZOPYRIDINE HCL 200 MG PO TABS: 200.0000 mg | ORAL_TABLET | Freq: Three times a day (TID) | ORAL | Status: DC | PRN

## 2015-05-31 MED ORDER — LACTATED RINGERS IV SOLN
INTRAVENOUS | Status: DC
  Administered 2015-05-31 (×2): via INTRAVENOUS

## 2015-05-31 MED ORDER — DEXAMETHASONE SODIUM PHOSPHATE 4 MG/ML IJ SOLN
INTRAMUSCULAR | Status: DC | PRN
  Administered 2015-05-31: 5 mg via INTRAVENOUS

## 2015-05-31 MED ORDER — FAMOTIDINE 20 MG PO TABS
ORAL_TABLET | ORAL | Status: AC
  Administered 2015-05-31: 20 mg via ORAL
  Filled 2015-05-31: qty 1

## 2015-05-31 MED ORDER — FAMOTIDINE 20 MG PO TABS
20.0000 mg | ORAL_TABLET | Freq: Once | ORAL | Status: AC
  Administered 2015-05-31: 20 mg via ORAL

## 2015-05-31 MED ORDER — FENTANYL CITRATE (PF) 100 MCG/2ML IJ SOLN
INTRAMUSCULAR | Status: DC | PRN
  Administered 2015-05-31: 50 ug via INTRAVENOUS

## 2015-05-31 MED ORDER — ONDANSETRON HCL 4 MG/2ML IJ SOLN: 4.0000 mg | Freq: Once | INTRAMUSCULAR | Status: DC | PRN

## 2015-05-31 MED ORDER — PROPOFOL 10 MG/ML IV BOLUS
INTRAVENOUS | Status: DC | PRN
  Administered 2015-05-31: 70 mg via INTRAVENOUS

## 2015-05-31 MED ORDER — FENTANYL CITRATE (PF) 100 MCG/2ML IJ SOLN: 25.0000 ug | INTRAMUSCULAR | Status: DC | PRN

## 2015-05-31 SURGICAL SUPPLY — 14 items
BAG DRAIN CYSTO-URO LG1000N (MISCELLANEOUS) ×2 IMPLANT
CORD URO TURP 10FT (MISCELLANEOUS) ×1 IMPLANT
GLOVE BIO SURGEON STRL SZ 6.5 (GLOVE) ×2 IMPLANT
GLOVE BIO SURGEON STRL SZ7 (GLOVE) ×4 IMPLANT
GOWN STRL REUS W/ TWL LRG LVL3 (GOWN DISPOSABLE) ×2 IMPLANT
GOWN STRL REUS W/TWL LRG LVL3 (GOWN DISPOSABLE) ×4
KIT RM TURNOVER CYSTO AR (KITS) ×2 IMPLANT
PACK CYSTO AR (MISCELLANEOUS) ×2 IMPLANT
PAD GROUND ADULT SPLIT (MISCELLANEOUS) ×2 IMPLANT
PREP PVP WINGED SPONGE (MISCELLANEOUS) ×2 IMPLANT
SET CYSTO W/LG BORE CLAMP LF (SET/KITS/TRAYS/PACK) ×2 IMPLANT
SURGILUBE 2OZ TUBE FLIPTOP (MISCELLANEOUS) ×2 IMPLANT
WATER STERILE IRR 1000ML POUR (IV SOLUTION) ×2 IMPLANT
WATER STERILE IRR 3000ML UROMA (IV SOLUTION) ×2 IMPLANT

## 2015-05-31 NOTE — Anesthesia Procedure Notes (Signed)
Procedure Name: LMA Insertion Date/Time: 05/31/2015 7:49 AM Performed by: Nelda Marseille Pre-anesthesia Checklist: Patient identified, Patient being monitored, Timeout performed, Emergency Drugs available and Suction available Patient Re-evaluated:Patient Re-evaluated prior to inductionOxygen Delivery Method: Circle system utilized Preoxygenation: Pre-oxygenation with 100% oxygen Intubation Type: IV induction Ventilation: Mask ventilation without difficulty LMA: LMA inserted LMA Size: 3.5 Tube type: Oral Number of attempts: 1 Placement Confirmation: positive ETCO2 and breath sounds checked- equal and bilateral Tube secured with: Tape Dental Injury: Teeth and Oropharynx as per pre-operative assessment

## 2015-05-31 NOTE — Anesthesia Preprocedure Evaluation (Addendum)
Anesthesia Evaluation  Patient identified by MRN, date of birth, ID band Patient awake    Reviewed: Allergy & Precautions, NPO status , Patient's Chart, lab work & pertinent test results, reviewed documented beta blocker date and time   Airway Mallampati: II       Dental  (+) Upper Dentures, Lower Dentures   Pulmonary asthma ,    breath sounds clear to auscultation       Cardiovascular hypertension, Pt. on medications and Pt. on home beta blockers  Rhythm:Regular Rate:Normal     Neuro/Psych Seizures -, Well Controlled,     GI/Hepatic negative GI ROS, Neg liver ROS,   Endo/Other  negative endocrine ROS  Renal/GU negative Renal ROS     Musculoskeletal   Abdominal Normal abdominal exam  (+)   Peds  Hematology negative hematology ROS (+)   Anesthesia Other Findings   Reproductive/Obstetrics                            Anesthesia Physical Anesthesia Plan  ASA: II  Anesthesia Plan: General   Post-op Pain Management:    Induction: Intravenous  Airway Management Planned: LMA  Additional Equipment:   Intra-op Plan:   Post-operative Plan: Extubation in OR  Informed Consent: I have reviewed the patients History and Physical, chart, labs and discussed the procedure including the risks, benefits and alternatives for the proposed anesthesia with the patient or authorized representative who has indicated his/her understanding and acceptance.     Plan Discussed with: CRNA  Anesthesia Plan Comments:         Anesthesia Quick Evaluation

## 2015-05-31 NOTE — OR Nursing (Signed)
Upon arrival to post op via stretcher, pt reports she needs to use the toilet.  Pt ambulated to bathroom, voided pink tinged urine into toilet, volume not measured.

## 2015-05-31 NOTE — Anesthesia Postprocedure Evaluation (Signed)
Anesthesia Post Note  Patient: Paula Jimenez  Procedure(s) Performed: Procedure(s) (LRB): CYSTOSCOPY WITH BIOPSY/ fulgeration (N/A)  Patient location during evaluation: PACU Anesthesia Type: General Level of consciousness: awake and alert Pain management: satisfactory to patient Vital Signs Assessment: post-procedure vital signs reviewed and stable Respiratory status: respiratory function stable Cardiovascular status: blood pressure returned to baseline Anesthetic complications: no    Last Vitals:  Filed Vitals:   05/31/15 0625  BP: 189/116  Pulse: 72  Temp: 36.6 C  Resp: 16    Last Pain: There were no vitals filed for this visit.               VAN STAVEREN,Annette Liotta

## 2015-05-31 NOTE — H&P (View-Only) (Signed)
05/14/2015  10:19 AM   Paula Jimenez 06-03-1927 [id number]  H&P  HPI: 81 yo F with gross hematuria. 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 She's had no further gross hematuria. She said no dysuria. On the CT scan she had a distended bladder but prior PVR was reported as normal. Her kidney function has remained normal with a creatinine of 0.6-0.7.  On cystoscopy the urethra appeared normal, at the bladder neck there are 2 cystic lesions which appear to be areas of cystitis glandularis. The trigone and ureteral orifices appeared normal with clear efflux. The remainder of the bladder mucosa appeared normal. There was no papillary tumor. No stone or foreign body in the bladder.   PMH: Past Medical History  Diagnosis Date  . Allergy   . Hyperlipidemia   . Osteoporosis   . Hypertension   . Bursitis, hip   . Osteopenia 08/01  . HYPERTENSION, BENIGN 10/26/2006    Qualifier: Diagnosis of  By: Selinda Orion    . MITRAL VALVE PROLAPSE 02/25/2007    Qualifier: History of  By: Marcelino Scot CMA, [place]    . Family history of adverse reaction to anesthesia     DAUGHTER had a reaction to ansethesia  . Asthma     years ago, 2-3 times a year pt experiences a problem with breathing.  . Skin cancer 2015    nose    Surgical History: Past Surgical History  Procedure Laterality Date  . Orif radial fracture Left 11/99    arm fracture, radial no surgery  . Breast biopsy Right 06/99    fibrocystic  . Abdominal hysterectomy  1971  . Eye surgery Bilateral 2011    June and August of 2011  . Tonsillectomy      Home Medications:    Medication List    ASK your doctor about these medications        atenolol 50 MG tablet  Commonly known as:  TENORMIN  Take 1 tablet (50 mg total) by mouth daily.     AYR SALINE NASAL NA  Place 1 spray into the nose daily as needed (allergies).     benzonatate 200 MG capsule  Commonly known as:  TESSALON    Take 200 mg by mouth 3 (three) times daily as needed for cough.     ciprofloxacin 500 MG tablet  Commonly known as:  CIPRO  Take 1 tablet (500 mg total) by mouth every 12 (twelve) hours.     clindamycin 300 MG capsule  Commonly known as:  CLEOCIN  Take 1 capsule (300 mg total) by mouth 3 (three) times daily.     Cranberry 1000 MG Caps  Take 84 mg by mouth 2 (two) times daily.     estradiol 0.1 MG/GM vaginal cream  Commonly known as:  ESTRACE  Place 1 Applicatorful vaginally 2 (two) times a week.     fish oil-omega-3 fatty acids 1000 MG capsule  Take 1 g by mouth daily.     GAVISCON EXTRA STRENGTH 160-105 MG Chew  Generic drug:  Alum Hydroxide-Mag Carbonate  Chew 1 tablet by mouth daily as needed (for heartburn).     loperamide 2 MG capsule  Commonly known as:  IMODIUM  Take 2 mg by mouth as needed for diarrhea or loose stools (has been taking with Clindamycin).     loratadine 10 MG tablet  Commonly known as:  CLARITIN  Take 10 mg by mouth as  needed for allergies or rhinitis.     ONE-A-DAY WOMENS PETITES PO  Take 1 tablet by mouth every morning.     simvastatin 20 MG tablet  Commonly known as:  ZOCOR  Take 1 tablet (20 mg total) by mouth daily.     vitamin C with rose hips 1000 MG tablet  Take 1,000 mg by mouth daily.     VITAMIN D (CHOLECALCIFEROL) PO  Take 1 tablet by mouth daily. 5000 IU IN AM        Allergies:  Allergies  Allergen Reactions  . Clindamycin/Lincomycin Swelling    Throat swelling  . Codeine     Unknown per pt   . Levofloxacin     Unknown per pt   . Rofecoxib     Unknown per pt   . Sulfamethoxazole-Trimethoprim     REACTION: unknown  . Valtrex [Valacyclovir Hcl]     Unknown per pt   . Adhesive [Tape] Rash  . Ciprofloxacin Diarrhea  . Nitrofurantoin Nausea Only  . Penicillins Swelling    mouth swelling  . Raloxifene Rash  . Tramadol     dizzy    Family History: Family History  Problem Relation Age of Onset  . Leukemia  Sister   . Cancer Sister     leukemia  . Heart disease Mother   . Diabetes Mother   . Stroke Father   . Heart failure Mother     Social History:  reports that she has never smoked. She does not have any smokeless tobacco history on file. She reports that she does not drink alcohol or use illicit drugs.  ROS: 12 point ROS negative   Physical Exam: BP 192/88 mmHg  Pulse 78  Temp(Src) 96.4 F (35.8 C) (Oral)  Resp 16  Ht 5\' 5"  (1.651 m)  Wt 125 lb (56.7 kg)  BMI 20.80 kg/m2  SpO2 99%  Constitutional:  Alert and oriented, No acute distress. HEENT: [place] AT, moist mucus membranes.  Trachea midline, no masses. Cardiovascular: No clubbing, cyanosis, or edema. RRR. Respiratory: Normal respiratory effort, no increased work of breathing. CTAB. GI: Abdomen is soft, nontender, nondistended, no abdominal masses GU: No CVA tenderness.  Skin: No rashes, bruises or suspicious lesions. Neurologic: Grossly intact, no focal deficits, moving all 4 extremities. Psychiatric: Normal mood and affect.  Laboratory Data: Lab Results  Component Value Date   WBC 6.8 04/25/2015   HGB 13.7 04/25/2015   HCT 40.6 04/25/2015   MCV 93.2 04/25/2015   PLT 171 04/25/2015    Lab Results  Component Value Date   CREATININE 0.70 04/04/2015    Urinalysis    Component Value Date/Time   COLORURINE yellow 09/02/2010 1003   APPEARANCEUR Clear 09/02/2010 1003   LABSPEC 1.010 09/02/2010 1003   PHURINE 6.0 09/02/2010 1003   GLUCOSEU Negative 05/07/2015 1533   HGBUR trace-lysed 09/02/2010 1003   BILIRUBINUR Negative 05/07/2015 1533   BILIRUBINUR negative 03/14/2015 1909   BILIRUBINUR Neg. 02/16/2015 1016   BILIRUBINUR negative 09/02/2010 1003   KETONESUR negative 03/14/2015 1909   PROTEINUR 30 mg/dL 02/16/2015 1016   UROBILINOGEN 0.2 03/14/2015 1909   UROBILINOGEN 0.2 09/02/2010 1003   NITRITE Negative 05/07/2015 1533   NITRITE Negative 03/14/2015 1909   NITRITE Neg. 02/16/2015 1016   NITRITE negative  09/02/2010 1003   LEUKOCYTESUR Trace* 05/07/2015 1533   LEUKOCYTESUR large (3+)* 03/14/2015 1909    Pertinent Imaging: ADDENDUM REPORT: 04/04/2015 16:15 ADDENDUM: Upon further consideration, mild ureteral dilatation is likely within normal limits and  relates to IV hydration. Bladder outlet obstruction unlikely. Electronically Signed  By: Suzy Bouchard M.D.  On: 04/04/2015 16:15     Study Result     CLINICAL DATA: Gross hematuria for 20 years. No history kidney stones.  EXAM: CT ABDOMEN AND PELVIS WITHOUT AND WITH CONTRAST  TECHNIQUE: Multidetector CT imaging of the abdomen and pelvis was performed following the standard protocol before and following the bolus administration of intravenous contrast.  CONTRAST: 189mL OMNIPAQUE IOHEXOL 300 MG/ML SOLN  COMPARISON: None.  FINDINGS: Lower chest: Lung bases are clear.  Hepatobiliary: No focal hepatic lesion. No biliary duct dilatation. Gallbladder is normal. Common bile duct is normal.  Pancreas: Pancreas is normal. No ductal dilatation. No pancreatic inflammation.  Spleen: Normal spleen  Adrenals/urinary tract: Adrenal glands are normal. Fat containing lesion exam from the lower pole of the RIGHT kidney measures 22 by 14 mm (image 46, series 4). No nephrolithiasis or ureterolithiasis. Cortical phase imaging demonstrates mild peripheral enhancement of the fat containing lesion lower pole of the RIGHT kidney. No additional enhancing lesions are present. Delayed pyelogram phase imaging demonstrates no filling defects within the collecting systems or ureters. There is mild bilateral hydroureter.  They bladder is moderately distended. No filling defect within the bladder. No enhancing lesion.  Stomach/Bowel: Stomach small bowel are normal. Cecum is normal. There are diverticula descending colon sigmoid colon without acute inflammation. Rectum appears normal.  Vascular/Lymphatic: Abdominal aorta  is normal caliber with atherosclerotic calcification. There is no retroperitoneal or periportal lymphadenopathy. No pelvic lymphadenopathy.  Reproductive: Post hysterectomy anatomy.  Musculoskeletal: No aggressive osseous lesion.  Other: No free fluid.  IMPRESSION: 1. Fat containing lesion is in the lower pole of the RIGHT kidney is most consistent with a benign angiomyolipoma. 2. No nephrolithiasis, ureterolithiasis, or obstructive uropathy. 3. Bladder is distended. There is mild hydroureter bilaterally which could indicate reflux or mild obstruction. Consider for bladder outlet obstruction. 4. Sigmoid diverticulosis without evidence of diverticulitis. 5. Atherosclerotic calcification of the abdominal aorta.  Electronically Signed: By: Suzy Bouchard M.D. On: 04/04/2015 15:49      Assessment & Plan:    #1 recurrent UTI, possible bladder distention-will need to monitor PVR in the future, treat for positive preop urine culture  #2 bladder neoplasm-likely benign cystic lesions at the bladder neck 1 need biopsy and fulguration. I discussed with the patient and her daughter the nature risks benefits and alternatives to cystoscopy with bladder biopsy. All questions answered. She elected to proceed. Urine was sent for culture.  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 Discussed more benign appearing features of this lesion which contains fat and is likely an AML. Will reimage in about 6 months and if stable can image yearly.  Hollice Espy, MD  [facility] [address], [place], [place] [address] [phone]

## 2015-05-31 NOTE — Op Note (Signed)
Date of procedure: 05/31/2015  Preoperative diagnosis:  1. Microscopic hematuria 2. Bladder lesion   Postoperative diagnosis:  1. Same as above   Procedure: 1. cystoscopy 2. bladder biopsy  Surgeon: Hollice Espy, MD  Anesthesia: General  Complications: None  Intraoperative findings: Two small ~2 mm yellow tinged cystic lesions at anterior bladder neck.    EBL: minimal  Specimens: bladder lesion biopsy  Drains: none  Indication: Paula Jimenez is a 81 y.o. patient with small cystic lesions at the bladder neck.  After reviewing the management options for treatment, he elected to proceed with the above surgical procedure(s). We have discussed the potential benefits and risks of the procedure, side effects of the proposed treatment, the likelihood of the patient achieving the goals of the procedure, and any potential problems that might occur during the procedure or recuperation. Informed consent has been obtained.  Description of procedure:  The patient was taken to the operating room and general anesthesia was induced.  The patient was placed in the dorsal lithotomy position, prepped and draped in the usual sterile fashion, and preoperative antibiotics were administered. A preoperative time-out was performed.   A rigid 21 Fr cystoscopy was introduced per urethra into the bladder.  The bladder itself appeared mildy trabeculated.  Trigone normal with clear efflux from each ureter.  Lesions within the bladder not immediately apparent with normal appearing mucosa.  Lens then exchanged for 70 degree at which time 2 very small ~2 mm yellow tinged cystic lesions identified at anterior bladder neck which were mostly consistent with cystitis cystica/ glandularis and likely benign.  Cold cup biopsy forceps then used to remove these lesion which were passed off the field.  Biopsy sites were then fulgurated using Bugbee electrocautery.  Hemostasis was excellent at this point.  Her bladder was  drained, she was repositioned in the supine positioned, reversed from anesthesia and taken to the PACU in stable condition.    Plan: Given likely benign pathology, will call to discuss biopsy result over the phone.  Follow up thereafter PRN.  Hollice Espy, M.D.

## 2015-05-31 NOTE — Interval H&P Note (Signed)
History and Physical Interval Note:  05/31/2015 7:19 AM  Paula Jimenez  has presented today for surgery, with the diagnosis of gross hematuria,bladder biopsy fulgeration  The various methods of treatment have been discussed with the patient and family. After consideration of risks, benefits and other options for treatment, the patient has consented to  Procedure(s): CYSTOSCOPY WITH BIOPSY/ fulgeration (N/A) as a surgical intervention .  The patient's history has been reviewed, patient examined, no change in status, stable for surgery.  I have reviewed the patient's chart and labs.  Questions were answered to the patient's satisfaction.     Hollice Espy

## 2015-05-31 NOTE — Discharge Instructions (Addendum)
Transurethral Resection of Bladder Tumor (TURBT) or Bladder Biopsy   Definition:  Transurethral Resection of the Bladder Tumor is a surgical procedure used to diagnose and remove tumors within the bladder. TURBT is the most common treatment for early stage bladder cancer.  General instructions:     Your recent bladder surgery requires very little post hospital care but some definite precautions.  Despite the fact that no skin incisions were used, the area around the bladder incisions are raw and covered with scabs to promote healing and prevent bleeding. Certain precautions are needed to insure that the scabs are not disturbed over the next 2-4 weeks while the healing proceeds.  Because the raw surface inside your bladder and the irritating effects of urine you may expect frequency of urination and/or urgency (a stronger desire to urinate) and perhaps even getting up at night more often. This will usually resolve or improve slowly over the healing period. You may see some blood in your urine over the first 6 weeks. Do not be alarmed, even if the urine was clear for a while. Get off your feet and drink lots of fluids until clearing occurs. If you start to pass clots or don't improve call us.  Diet:  You may return to your normal diet immediately. Because of the raw surface of your bladder, alcohol, spicy foods, foods high in acid and drinks with caffeine may cause irritation or frequency and should be used in moderation. To keep your urine flowing freely and avoid constipation, drink plenty of fluids during the day (8-10 glasses). Tip: Avoid cranberry juice because it is very acidic.  Activity:  Your physical activity doesn't need to be restricted. However, if you are very active, you may see some blood in the urine. We suggest that you reduce your activity under the circumstances until the bleeding has stopped.  Bowels:  It is important to keep your bowels regular during the postoperative  period. Straining with bowel movements can cause bleeding. A bowel movement every other day is reasonable. Use a mild laxative if needed, such as milk of magnesia 2-3 tablespoons, or 2 Dulcolax tablets. Call if you continue to have problems. If you had been taking narcotics for pain, before, during or after your surgery, you may be constipated. Take a laxative if necessary.    Medication:  You should resume your pre-surgery medications unless told not to. In addition you may be given an antibiotic to prevent or treat infection. Antibiotics are not always necessary. All medication should be taken as prescribed until the bottles are finished unless you are having an unusual reaction to one of the drugs.   [place] [address], [place], [place] [address] [phone] \    AMBULATORY SURGERY  DISCHARGE INSTRUCTIONS   1) The drugs that you were given will stay in your system until tomorrow so for the next 24 hours you should not:  A) Drive an automobile B) Make any legal decisions C) Drink any alcoholic beverage   2) You may resume regular meals tomorrow.  Today it is better to start with liquids and gradually work up to solid foods.     3) Please notify your doctor immediately if you have any unusual bleeding, trouble breathing, redness and pain at the surgery site, drainage, fever, or pain not relieved by medication.

## 2015-05-31 NOTE — Progress Notes (Signed)
No dsg no drainage

## 2015-05-31 NOTE — Transfer of Care (Signed)
Immediate Anesthesia Transfer of Care Note  Patient: Paula Jimenez  Procedure(s) Performed: Procedure(s): CYSTOSCOPY WITH BIOPSY/ fulgeration (N/A)  Patient Location: PACU  Anesthesia Type:General  Level of Consciousness: sedated  Airway & Oxygen Therapy: Patient Spontanous Breathing and Patient connected to face mask oxygen  Post-op Assessment: Report given to RN and Post -op Vital signs reviewed and stable  Post vital signs: Reviewed and stable  Last Vitals:  Filed Vitals:   05/31/15 0625  BP: 189/116  Pulse: 72  Temp: 36.6 C  Resp: 16    Complications: No apparent anesthesia complications

## 2015-06-01 LAB — SURGICAL PATHOLOGY

## 2015-06-04 ENCOUNTER — Telehealth: Payer: Self-pay

## 2015-06-04 NOTE — Telephone Encounter (Signed)
-----   Message from Paula Espy, MD sent at 06/01/2015  2:54 PM EST ----- Please let [name] know that her bladder biopsy was BENIGN as I expected.  There is no cancer.  Follow up will be as needed.  Paula Espy, MD

## 2015-06-04 NOTE — Telephone Encounter (Signed)
Spoke with pt in reference to bladder bx results. Pt voiced understanding.

## 2015-07-30 ENCOUNTER — Ambulatory Visit (INDEPENDENT_AMBULATORY_CARE_PROVIDER_SITE_OTHER): Payer: Commercial Managed Care - HMO | Admitting: Obstetrics and Gynecology

## 2015-07-30 ENCOUNTER — Encounter: Payer: Self-pay | Admitting: Obstetrics and Gynecology

## 2015-07-30 ENCOUNTER — Telehealth: Payer: Self-pay | Admitting: Obstetrics and Gynecology

## 2015-07-30 VITALS — BP 167/80 | HR 69 | Resp 16 | Ht 65.0 in | Wt 123.1 lb

## 2015-07-30 DIAGNOSIS — N390 Urinary tract infection, site not specified: Secondary | ICD-10-CM

## 2015-07-30 DIAGNOSIS — N952 Postmenopausal atrophic vaginitis: Secondary | ICD-10-CM | POA: Diagnosis not present

## 2015-07-30 DIAGNOSIS — R319 Hematuria, unspecified: Secondary | ICD-10-CM

## 2015-07-30 DIAGNOSIS — N362 Urethral caruncle: Secondary | ICD-10-CM

## 2015-07-30 LAB — URINALYSIS, COMPLETE
Bilirubin, UA: NEGATIVE
Glucose, UA: NEGATIVE
Ketones, UA: NEGATIVE
Nitrite, UA: NEGATIVE
PH UA: 5.5 (ref 5.0–7.5)
Protein, UA: NEGATIVE
SPEC GRAV UA: 1.01 (ref 1.005–1.030)
Urobilinogen, Ur: 0.2 mg/dL (ref 0.2–1.0)

## 2015-07-30 LAB — MICROSCOPIC EXAMINATION
Epithelial Cells (non renal): NONE SEEN /hpf (ref 0–10)
RBC, UA: NONE SEEN /hpf (ref 0–?)

## 2015-07-30 MED ORDER — NITROFURANTOIN MONOHYD MACRO 100 MG PO CAPS: 100.0000 mg | ORAL_CAPSULE | Freq: Two times a day (BID) | ORAL | Status: DC

## 2015-07-30 MED ORDER — ESTRADIOL 0.1 MG/GM VA CREA: TOPICAL_CREAM | VAGINAL | Status: DC

## 2015-07-30 NOTE — Progress Notes (Signed)
11:15 AM   Paula Jimenez Oct 14, 1926 [id number]  Referring provider: Abner Greenspan, MD [address] [place], [place] [address]  Chief Complaint  Patient presents with  . Hematuria    HPI: Patient is a 82 year old female presenting today as a referral from her primary care provider for an episode of gross hematuria on 02/16/15. Urine dipstick was also positive for large blood at that time. She does have a history of recurrent urinary tract infections with multiple positive cultures greater than 100,000 Escherichia coli over the last year. Most recent culture however performed on 03/14/15 was positive for greater than 100,000 colonies of Klebsiella pneumoniae. She does have allergies/intolerance to multiple antibiotics. She was most recently treated with 1 week course of Macrobid.  Patient reports only bothersome urinary symptoms when she has an infection. Symptoms include frequent urination, urgency, dysuria and nocturia. She is asymptomatic today. At baseline she does state that she experiences some difficulty emptying her bladder requiring her to change positions while seated on the toilet.  No history of kidney stones. She is status post partial abdominal hysterectomy. She denies any recent change in vaginal symptoms but she states that she has felt pressure and a bulge in her vagina previously but believes it may have gone away.  Interval History: Patient underwent evaluation for gross hematuria.  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 She's had no further gross hematuria. On the CT scan she had a distended bladder but prior PVR was reported as normal. Her kidney function has remained normal with a creatinine of 0.6-0.7.  She underwent cystoscopy by Dr. Junious Silk 04/24/15 and subsequent bladder biopsies with fulguration by Dr. Erlene Quan 05/31/15. Pathology benign.  She has been noticing increased urgency, urge incontinence and  post micturation discomfort.  She noticed a small amount of blood on toilet tissue a few days ago. History of vaginal atrophy and urethral caruncle.   PMH: Past Medical History  Diagnosis Date  . Allergy   . Hyperlipidemia   . Osteoporosis   . Hypertension   . Bursitis, hip   . Osteopenia 08/01  . HYPERTENSION, BENIGN 10/26/2006    Qualifier: Diagnosis of  By: Selinda Orion    . MITRAL VALVE PROLAPSE 02/25/2007    Qualifier: History of  By: Marcelino Scot CMA, [place] Bil[place]    . Family history of adverse reaction to anesthesia     DAUGHTER had a reaction to ansethesia  . Asthma     years ago, 2-3 times a year pt experiences a problem with breathing.  . Skin cancer 2015    nose    Surgical History: Past Surgical History  Procedure Laterality Date  . Orif radial fracture Left 11/99    arm fracture, radial no surgery  . Breast biopsy Right 06/99    fibrocystic  . Abdominal hysterectomy  1971  . Eye surgery Bilateral 2011    June and August of 2011  . Tonsillectomy    . Cystoscopy with biopsy N/A 05/31/2015    Procedure: CYSTOSCOPY WITH BIOPSY/ fulgeration;  Surgeon: Hollice Espy, MD;  Location: [facility] ORS;  Service: Urology;  Laterality: N/A;    Home Medications:    Medication List       This list is accurate as of: 07/30/15 11:15 AM.  Always use your most recent med list.               atenolol 50 MG tablet  Commonly  known as:  TENORMIN  Take 1 tablet (50 mg total) by mouth daily.     AYR SALINE NASAL NA  Place 1 spray into the nose daily as needed (allergies).     benzonatate 200 MG capsule  Commonly known as:  TESSALON  Take 200 mg by mouth 3 (three) times daily as needed for cough. Reported on 07/30/2015     clotrimazole-betamethasone cream  Commonly known as:  LOTRISONE  Apply 1 application topically as needed.     estradiol 0.1 MG/GM vaginal cream  Commonly known as:  ESTRACE  Place 1 Applicatorful vaginally 2 (two) times a week.     fish oil-omega-3 fatty acids  1000 MG capsule  Take 1 g by mouth daily.     GAVISCON EXTRA STRENGTH 160-105 MG Chew  Generic drug:  Alum Hydroxide-Mag Carbonate  Chew 1 tablet by mouth daily as needed (for heartburn).     loperamide 2 MG capsule  Commonly known as:  IMODIUM  Take 2 mg by mouth as needed for diarrhea or loose stools (has been taking with Clindamycin).     loratadine 10 MG tablet  Commonly known as:  CLARITIN  Take 10 mg by mouth as needed for allergies or rhinitis.     ONE-A-DAY WOMENS PETITES PO  Take 1 tablet by mouth every morning. Reported on 07/30/2015     simvastatin 20 MG tablet  Commonly known as:  ZOCOR  Take 1 tablet (20 mg total) by mouth daily.     vitamin C with rose hips 1000 MG tablet  Take 1,000 mg by mouth daily.     VITAMIN D (CHOLECALCIFEROL) PO  Take 1 tablet by mouth daily. 5000 IU IN AM        Allergies:  Allergies  Allergen Reactions  . Clindamycin/Lincomycin Swelling    Throat swelling  . Codeine     Unknown per pt   . Levofloxacin     Unknown per pt   . Rofecoxib     Unknown per pt   . Sulfamethoxazole-Trimethoprim     REACTION: unknown  . Valtrex [Valacyclovir Hcl]     Unknown per pt   . Adhesive [Tape] Rash  . Ciprofloxacin Diarrhea  . Nitrofurantoin Nausea Only  . Penicillins Swelling    mouth swelling  . Raloxifene Rash  . Tramadol     dizzy    Family History: Family History  Problem Relation Age of Onset  . Leukemia Sister   . Cancer Sister     leukemia  . Heart disease Mother   . Diabetes Mother   . Stroke Father   . Heart failure Mother     Social History:  reports that she has never smoked. She does not have any smokeless tobacco history on file. She reports that she does not drink alcohol or use illicit drugs.  ROS: UROLOGY Frequent Urination?: No Hard to postpone urination?: No Burning/pain with urination?: No Get up at night to urinate?: No Leakage of urine?: No Urine stream starts and stops?: No Trouble starting  stream?: No Do you have to strain to urinate?: No Blood in urine?: No Urinary tract infection?: No Sexually transmitted disease?: No Injury to kidneys or bladder?: No Painful intercourse?: No Weak stream?: No Currently pregnant?: No Vaginal bleeding?: No Last menstrual period?: n/a  Gastrointestinal Nausea?: No Vomiting?: No Indigestion/heartburn?: No Diarrhea?: Yes Constipation?: No  Constitutional Fever: No Night sweats?: No Weight loss?: No Fatigue?: No  Skin Skin rash/lesions?: No Itching?: No  Eyes Blurred vision?: No Double vision?: No  Ears/Nose/Throat Sore throat?: No Sinus problems?: No  Hematologic/Lymphatic Swollen glands?: No Easy bruising?: No  Cardiovascular Leg swelling?: No Chest pain?: No  Respiratory Cough?: Yes Shortness of breath?: No  Endocrine Excessive thirst?: No  Musculoskeletal Back pain?: No Joint pain?: No  Neurological Headaches?: No Dizziness?: No  Psychologic Depression?: No Anxiety?: No  Physical Exam: BP 167/80 mmHg  Pulse 69  Resp 16  Ht 5\' 5"  (1.651 m)  Wt 123 lb 1.6 oz (55.838 kg)  BMI 20.48 kg/m2  Constitutional:  Alert and oriented, No acute distress. HEENT: [place] AT, moist mucus membranes.  Trachea midline, no masses. Cardiovascular: No clubbing, cyanosis, or edema. Respiratory: Normal respiratory effort, no increased work of breathing. GI: Abdomen is soft, nontender, nondistended, no abdominal masses GU: No CVA tenderness.  Pelvic Exam: Grade 2-3 cystocele present with Valsalva. No demonstratable SUI, mucosal pale with decreased rugae, urethral caruncle present without active bleeding Skin: No rashes, bruises or suspicious lesions. Lymph: No cervical or inguinal adenopathy. Neurologic: Grossly intact, no focal deficits, moving all 4 extremities. Psychiatric: Normal mood and affect.  Laboratory Data: Lab Results  Component Value Date   WBC 6.8 04/25/2015   HGB 13.7 04/25/2015   HCT 40.6  04/25/2015   MCV 93.2 04/25/2015   PLT 171 04/25/2015    Lab Results  Component Value Date   CREATININE 0.70 04/04/2015    No results found for: PSA  No results found for: TESTOSTERONE  No results found for: HGBA1C  Urinalysis    Component Value Date/Time   COLORURINE YELLOW* 05/14/2015 1009   APPEARANCEUR HAZY* 05/14/2015 1009   LABSPEC 1.014 05/14/2015 1009   PHURINE 6.0 05/14/2015 1009   GLUCOSEU NEGATIVE 05/14/2015 1009   HGBUR 1+* 05/14/2015 1009   HGBUR trace-lysed 09/02/2010 1003   BILIRUBINUR NEGATIVE 05/14/2015 1009   BILIRUBINUR Negative 05/07/2015 1533   BILIRUBINUR negative 03/14/2015 1909   BILIRUBINUR Neg. 02/16/2015 1016   KETONESUR NEGATIVE 05/14/2015 1009   KETONESUR negative 03/14/2015 1909   PROTEINUR NEGATIVE 05/14/2015 1009   PROTEINUR 30 mg/dL 02/16/2015 1016   UROBILINOGEN 0.2 03/14/2015 1909   UROBILINOGEN 0.2 09/02/2010 1003   NITRITE NEGATIVE 05/14/2015 1009   NITRITE Negative 05/07/2015 1533   NITRITE Negative 03/14/2015 1909   NITRITE Neg. 02/16/2015 1016   LEUKOCYTESUR 2+* 05/14/2015 1009   LEUKOCYTESUR Trace* 05/07/2015 1533    Pertinent Imaging:   Assessment & Plan:    1. Gross hematuria-  S/p negative hematuria workup 06/10/15. - Urinalysis, Complete   2. Recurrent UTI-  Catheterized specimen obtained. UA suspicious for infection today.  Patient is going out of town so she was provided a prescription of macrobid to hold in case symptoms become more severe prior to culture resulting.  3. Vaginal Atrophy- Restart vaginal estrogen cream. Prescription resent to pharmacy.  4. Urethral caruncle- Currently asymptomatic but may be the source of patient's recent recurrent hematuria. Will treat with vaginal estrogen replacement.  Restart vaginal estrogen cream.  Return in about 1 month (around 08/27/2015).  Herbert Moors, [facility] [address], [place], [place] [address] [phone]

## 2015-07-30 NOTE — Telephone Encounter (Signed)
Script was printed. Will fax to [facility].

## 2015-07-30 NOTE — Telephone Encounter (Signed)
Pt's daughter called and stated that her meds have been called in to wrong pharmacy.  Her one and only pharmacy should be [facility] in [place], [place].  Ria Comment gave sample of Estrace and she would like a prescription called in to [facility] in [place], [place].

## 2015-07-30 NOTE — Patient Instructions (Signed)
ESTRACE Cream- Vaginal Estrogen Cream apply a blueberry sized amount to vaginal opening using finger tip nightly for 2 weeks then use 3 times weekly before bed.

## 2015-08-01 LAB — CULTURE, URINE COMPREHENSIVE

## 2015-08-02 ENCOUNTER — Telehealth: Payer: Self-pay

## 2015-08-02 ENCOUNTER — Other Ambulatory Visit: Payer: Self-pay

## 2015-08-02 DIAGNOSIS — N390 Urinary tract infection, site not specified: Secondary | ICD-10-CM

## 2015-08-02 MED ORDER — SULFAMETHOXAZOLE-TRIMETHOPRIM 800-160 MG PO TABS: 1.0000 | ORAL_TABLET | Freq: Two times a day (BID) | ORAL | Status: AC

## 2015-08-02 NOTE — Progress Notes (Signed)
Pt daughter called stating the nurse spoke with her mother earlier in the morning in reference to an abx but the new abx was not called into the pharmacy. Nurse made daughter aware she did not understand pt to tell her she needed a new abx. Daughter stated pt can not have macrobid but can have septra with imodium. Per Ria Comment septra bid was called in for pt. Daughter voiced understanding.

## 2015-08-02 NOTE — Telephone Encounter (Signed)
Spoke with pt in reference to medication. Pt stated she has not currently started taking the medication. Reinforced with pt to go ahead and start taking medication. Pt voiced understanding. Pt stated that she would prefer to wait until her March 6 appt to have cath specimen. Made pt aware that would be ok.

## 2015-08-02 NOTE — Telephone Encounter (Signed)
-----   Message from Roda Shutters, [place] sent at 08/02/2015  8:24 AM EST ----- Please notify patient that her urine culture was positive for infection. If she is not artery started taking her Macrobid I would like her to complete it as directed. He needs to follow up for a catheterized urine specimen in 2 weeks. Thanks

## 2015-08-07 ENCOUNTER — Ambulatory Visit (INDEPENDENT_AMBULATORY_CARE_PROVIDER_SITE_OTHER): Payer: Commercial Managed Care - HMO

## 2015-08-07 VITALS — BP 134/80 | HR 67 | Temp 98.4°F | Ht 65.0 in | Wt 123.0 lb

## 2015-08-07 DIAGNOSIS — Z0000 Encounter for general adult medical examination without abnormal findings: Secondary | ICD-10-CM

## 2015-08-07 NOTE — Progress Notes (Signed)
Pre visit review using our clinic review tool, if applicable. No additional management support is needed unless otherwise documented below in the visit note. 

## 2015-08-07 NOTE — Progress Notes (Signed)
Subjective:   Paula Jimenez is a 82 y.o. female who presents for Medicare Annual Wellness preventive examination.  Review of Systems:  No ROS.   Cardiac Risk Factors include: advanced age (>71men, >62 women);hypertension;sedentary lifestyle     Objective:     Vitals: BP 134/80 mmHg  Pulse 67  Temp(Src) 98.4 F (36.9 C) (Oral)  Ht 5\' 5"  (1.651 m)  Wt 123 lb (55.792 kg)  BMI 20.47 kg/m2  SpO2 96%  Tobacco History  Smoking status  . Never Smoker   Smokeless tobacco  . Never used     Counseling given: N/A   Past Medical History  Diagnosis Date  . Allergy   . Hyperlipidemia   . Osteoporosis   . Hypertension   . Bursitis, hip   . Osteopenia 08/01  . HYPERTENSION, BENIGN 10/26/2006    Qualifier: Diagnosis of  By: Selinda Orion    . MITRAL VALVE PROLAPSE 02/25/2007    Qualifier: History of  By: Marcelino Scot CMA, [place]    . Family history of adverse reaction to anesthesia     DAUGHTER had a reaction to ansethesia  . Asthma     years ago, 2-3 times a year pt experiences a problem with breathing.  . Skin cancer 2015    nose   Past Surgical History  Procedure Laterality Date  . Orif radial fracture Left 11/99    arm fracture, radial no surgery  . Breast biopsy Right 06/99    fibrocystic  . Abdominal hysterectomy  1971  . Eye surgery Bilateral 2011    June and August of 2011  . Tonsillectomy    . Cystoscopy with biopsy N/A 05/31/2015    Procedure: CYSTOSCOPY WITH BIOPSY/ fulgeration;  Surgeon: Hollice Espy, MD;  Location: [facility] ORS;  Service: Urology;  Laterality: N/A;   Family History  Problem Relation Age of Onset  . Leukemia Sister   . Cancer Sister     leukemia  . Heart disease Mother   . Diabetes Mother   . Stroke Father   . Heart failure Mother    History  Sexual Activity  . Sexual Activity: NO No    Outpatient Encounter Prescriptions as of 08/07/2015  Medication Sig  . Alum Hydroxide-Mag Carbonate (GAVISCON EXTRA STRENGTH) 160-105 MG CHEW  Chew 1 tablet by mouth daily as needed (for heartburn).   . Ascorbic Acid (VITAMIN C WITH ROSE HIPS) 1000 MG tablet Take 1,000 mg by mouth daily.  [name] atenolol (TENORMIN) 50 MG tablet Take 1 tablet (50 mg total) by mouth daily. (Patient taking differently: Take 50 mg by mouth at bedtime. )  . AYR SALINE NASAL NA Place 1 spray into the nose daily as needed (allergies).   . benzonatate (TESSALON) 200 MG capsule Take 200 mg by mouth 3 (three) times daily as needed for cough. Reported on 07/30/2015  . clotrimazole-betamethasone (LOTRISONE) cream Apply 1 application topically as needed.  [name] estradiol (ESTRACE) 0.1 MG/GM vaginal cream Vaginal Estrogen Cream apply a blueberry sized amount to vaginal opening using finger tip nightly for 2 weeks then use 3 times weekly before bed.  . fish oil-omega-3 fatty acids 1000 MG capsule Take 1 g by mouth daily.    [name] loperamide (IMODIUM) 2 MG capsule Take 2 mg by mouth as needed for diarrhea or loose stools (has been taking with Clindamycin).  [name] loratadine (CLARITIN) 10 MG tablet Take 10 mg by mouth as needed for allergies or rhinitis.   . Multiple Vitamins-Minerals (ONE-A-DAY WOMENS  PETITES PO) Take 1 tablet by mouth every morning. Reported on 07/30/2015  . nitrofurantoin, macrocrystal-monohydrate, (MACROBID) 100 MG capsule Take 1 capsule (100 mg total) by mouth every 12 (twelve) hours.  . simvastatin (ZOCOR) 20 MG tablet Take 1 tablet (20 mg total) by mouth daily. (Patient taking differently: Take 20 mg by mouth at bedtime. )  . sulfamethoxazole-trimethoprim (BACTRIM DS,SEPTRA DS) 800-160 MG tablet Take 1 tablet by mouth 2 (two) times daily.  [name] VITAMIN D, CHOLECALCIFEROL, PO Take 1 tablet by mouth daily. 5000 IU IN AM   No facility-administered encounter medications on file as of 08/07/2015.    Activities of Daily Living In your present state of health, do you have any difficulty performing the following activities: 08/07/2015 05/25/2015  Hearing? N -  Vision? N -    Difficulty concentrating or making decisions? N -  Walking or climbing stairs? N -  Dressing or bathing? N -  Doing errands, shopping? N N  Preparing Food and eating ? N -  Using the Toilet? N -  Do you have problems with loss of bowel control? N -  Managing your Medications? N -  Managing your Finances? N -  Housekeeping or managing your Housekeeping? N -    Patient Care Team: Abner Greenspan, MD as PCP - General    Assessment:     Exercise Activities and Dietary recommendations Current Exercise Habits:: The patient does not participate in regular exercise at present (does yard work)  Goals    . Increase water intake     Starting 08/07/2015, I will continue to consume at least 64 oz of water daily.       Fall Risk Fall Risk  08/07/2015 11/09/2012  Falls in the past year? No No  Risk for fall due to : History of fall(s) -   Depression Screen PHQ 2/9 Scores 08/07/2015 11/09/2012  PHQ - 2 Score 0 0     Cognitive Testing MMSE - Mini Mental State Exam 08/07/2015  Orientation to time 5  Orientation to Place 5  Registration 3  Attention/ Calculation 5  Recall 3  Language- repeat 1  Language- follow 3 step command 3  Language- read & follow direction 1  Copy design 1    Immunization History  Administered Date(s) Administered  . Influenza,inj,Quad PF,36+ Mos 06/22/2013, 06/26/2014, 04/25/2015  . Pneumococcal Conjugate-13 06/26/2014  . Pneumococcal Polysaccharide-23 11/19/2011  . Td 04/24/1998, 11/10/2008   Screening Tests Health Maintenance  Topic Date Due  . MAMMOGRAM  07/25/2020   . ZOSTAVAX  07/25/2020 (Originally 10/29/1988)  . INFLUENZA VACCINE  01/22/2016  . TETANUS/TDAP  11/11/2018  . DEXA SCAN  Completed  . PNA vac Low Risk Adult  Completed      Plan:     Quality Care Metrics:  Physical activity was assessed and documented. Urinary incontinence was assessed and documented  Best Practice Recommendations:  PHQ2 completed - negative for  depression  Fall Risk Screening completed - no falls within previous 12 mths  Discuss bone density for prevention based on age - will discuss necessity for screening at annual CPE  Spirometry or PFT - no evidence of cough; O2 sat greater than 90%; test will not be completed at this visit  Zoster - cost is a concern for vaccination; report hx of shingles  Risk Calculations:  Osteoporosis - reduce risk of falls to prevent a fracture; fall prevention education provided to patient as part of AVS  Patient Instructions (the written plan) was given  to the patient.   Lindell Noe, LPN  [id number]

## 2015-08-07 NOTE — Patient Instructions (Addendum)
Paula Jimenez , Thank you for taking time to come for your Medicare Wellness Visit. I appreciate your ongoing commitment to your health goals. Please review the following plan we discussed and let me know if I can assist you in the future.   These are the goals we discussed: Goals    Starting 08/07/15, patient will continue to drink at least 64 oz of water daily.      This is a list of the screening recommended for you and due dates:  Health Maintenance  Topic Date Due  . Mammogram  07/25/2020*  . Shingles Vaccine  07/25/2020*  . Flu Shot  01/22/2016  . Tetanus Vaccine  11/11/2018  . DEXA scan (bone density measurement)  Completed  . Pneumonia vaccines  Completed  *Topic was postponed. The date shown is not the original due date.    Bone Densitometry Bone densitometry is an imaging test that uses a special X-ray to measure the amount of calcium and other minerals in your bones (bone density). This test is also known as a bone mineral density test or dual-energy X-ray absorptiometry (DXA). The test can measure bone density at your hip and your spine. It is similar to having a regular X-ray. You may have this test to:  Diagnose a condition that causes weak or thin bones (osteoporosis).  Predict your risk of a broken bone (fracture).  Determine how well osteoporosis treatment is working. LET [facility] CARE PROVIDER KNOW ABOUT:  Any allergies you have.  All medicines you are taking, including vitamins, herbs, eye drops, creams, and over-the-counter medicines.  Previous problems you or members of your family have had with the use of anesthetics.  Any blood disorders you have.  Previous surgeries you have had.  Medical conditions you have.  Possibility of pregnancy.  Any other medical test you had within the previous 14 days that used contrast material. RISKS AND COMPLICATIONS Generally, this is a safe procedure. However, problems can occur and may include the  following:  This test exposes you to a very small amount of radiation.  The risks of radiation exposure may be greater to unborn children. BEFORE THE PROCEDURE  Do not take any calcium supplements for 24 hours before having the test. You can otherwise eat and drink what you usually do.  Take off all metal jewelry, eyeglasses, dental appliances, and any other metal objects. PROCEDURE  You may lie on an exam table. There will be an X-ray generator below you and an imaging device above you.  Other devices, such as boxes or braces, may be used to position your body properly for the scan.  You will need to lie still while the machine slowly scans your body.  The images will show up on a computer monitor. AFTER THE PROCEDURE You may need more testing at a later time.   This information is not intended to replace advice given to you by your health care provider. Make sure you discuss any questions you have with your health care provider.   Document Released: 07/01/2004 Document Revised: 06/30/2014 Document Reviewed: 11/17/2013 Elsevier Interactive Patient Education 2016 [employer].  Fall Prevention in the Home  Falls can cause injuries. They can happen to people of all ages. There are many things you can do to make your home safe and to help prevent falls.  WHAT CAN I DO ON THE OUTSIDE OF MY HOME?  Regularly fix the edges of walkways and driveways and fix any cracks.  Remove anything that  might make you trip as you walk through a door, such as a raised step or threshold.  Trim any bushes or trees on the path to your home.  Use bright outdoor lighting.  Clear any walking paths of anything that might make someone trip, such as rocks or tools.  Regularly check to see if handrails are loose or broken. Make sure that both sides of any steps have handrails.  Any raised decks and porches should have guardrails on the edges.  Have any leaves, snow, or ice cleared regularly.  Use  sand or salt on walking paths during winter.  Clean up any spills in your garage right away. This includes oil or grease spills. WHAT CAN I DO IN THE BATHROOM?   Use night lights.  Install grab bars by the toilet and in the tub and shower. Do not use towel bars as grab bars.  Use non-skid mats or decals in the tub or shower.  If you need to sit down in the shower, use a plastic, non-slip stool.  Keep the floor dry. Clean up any water that spills on the floor as soon as it happens.  Remove soap buildup in the tub or shower regularly.  Attach bath mats securely with double-sided non-slip rug tape.  Do not have throw rugs and other things on the floor that can make you trip. WHAT CAN I DO IN THE BEDROOM?  Use night lights.  Make sure that you have a light by your bed that is easy to reach.  Do not use any sheets or blankets that are too big for your bed. They should not hang down onto the floor.  Have a firm chair that has side arms. You can use this for support while you get dressed.  Do not have throw rugs and other things on the floor that can make you trip. WHAT CAN I DO IN THE KITCHEN?  Clean up any spills right away.  Avoid walking on wet floors.  Keep items that you use a lot in easy-to-reach places.  If you need to reach something above you, use a strong step stool that has a grab bar.  Keep electrical cords out of the way.  Do not use floor polish or wax that makes floors slippery. If you must use wax, use non-skid floor wax.  Do not have throw rugs and other things on the floor that can make you trip. WHAT CAN I DO WITH MY STAIRS?  Do not leave any items on the stairs.  Make sure that there are handrails on both sides of the stairs and use them. Fix handrails that are broken or loose. Make sure that handrails are as long as the stairways.  Check any carpeting to make sure that it is firmly attached to the stairs. Fix any carpet that is loose or  worn.  Avoid having throw rugs at the top or bottom of the stairs. If you do have throw rugs, attach them to the floor with carpet tape.  Make sure that you have a light switch at the top of the stairs and the bottom of the stairs. If you do not have them, ask someone to add them for you. WHAT ELSE CAN I DO TO HELP PREVENT FALLS?  Wear shoes that:  Do not have high heels.  Have rubber bottoms.  Are comfortable and fit you well.  Are closed at the toe. Do not wear sandals.  If you use a stepladder:  Make sure  that it is fully opened. Do not climb a closed stepladder.  Make sure that both sides of the stepladder are locked into place.  Ask someone to hold it for you, if possible.  Clearly mark and make sure that you can see:  Any grab bars or handrails.  First and last steps.  Where the edge of each step is.  Use tools that help you move around (mobility aids) if they are needed. These include:  Canes.  Walkers.  Scooters.  Crutches.  Turn on the lights when you go into a dark area. Replace any light bulbs as soon as they burn out.  Set up your furniture so you have a clear path. Avoid moving your furniture around.  If any of your floors are uneven, fix them.  If there are any pets around you, be aware of where they are.  Review your medicines with your doctor. Some medicines can make you feel dizzy. This can increase your chance of falling. Ask your doctor what other things that you can do to help prevent falls.   This information is not intended to replace advice given to you by your health care provider. Make sure you discuss any questions you have with your health care provider.   Document Released: 04/05/2009 Document Revised: 10/24/2014 Document Reviewed: 07/14/2014 Elsevier Interactive Patient Education 2016 [place] Maintenance, Female Adopting a healthy lifestyle and getting preventive care can go a long way to promote health and  wellness. Talk with your health care provider about what schedule of regular examinations is right for you. This is a good chance for you to check in with your provider about disease prevention and staying healthy. In between checkups, there are plenty of things you can do on your own. Experts have done a lot of research about which lifestyle changes and preventive measures are most likely to keep you healthy. Ask your health care provider for more information. WEIGHT AND DIET  Eat a healthy diet  Be sure to include plenty of vegetables, fruits, low-fat dairy products, and lean protein.  Do not eat a lot of foods high in solid fats, added sugars, or salt.  Get regular exercise. This is one of the most important things you can do for your health.  Most adults should exercise for at least 150 minutes each week. The exercise should increase your heart rate and make you sweat (moderate-intensity exercise).  Most adults should also do strengthening exercises at least twice a week. This is in addition to the moderate-intensity exercise.  Maintain a healthy weight  Body mass index (BMI) is a measurement that can be used to identify possible weight problems. It estimates body fat based on height and weight. Your health care provider can help determine your BMI and help you achieve or maintain a healthy weight.  For females 74 years of age and older:   A BMI below 18.5 is considered underweight.  A BMI of 18.5 to 24.9 is normal.  A BMI of 25 to 29.9 is considered overweight.  A BMI of 30 and above is considered obese.  Watch levels of cholesterol and blood lipids  You should start having your blood tested for lipids and cholesterol at 80 years of age, then have this test every 5 years.  You may need to have your cholesterol levels checked more often if:  Your lipid or cholesterol levels are high.  You are older than 80 years of age.  You are at high  risk for heart disease.  CANCER  SCREENING   Lung Cancer  Lung cancer screening is recommended for adults 63-53 years old who are at high risk for lung cancer because of a history of smoking.  A yearly low-dose CT scan of the lungs is recommended for people who:  Currently smoke.  Have quit within the past 15 years.  Have at least a 30-pack-year history of smoking. A pack year is smoking an average of one pack of cigarettes a day for 1 year.  Yearly screening should continue until it has been 15 years since you quit.  Yearly screening should stop if you develop a health problem that would prevent you from having lung cancer treatment.  Breast Cancer  Practice breast self-awareness. This means understanding how your breasts normally appear and feel.  It also means doing regular breast self-exams. Let your health care provider know about any changes, no matter how small.  If you are in your 20s or 30s, you should have a clinical breast exam (CBE) by a health care provider every 1-3 years as part of a regular health exam.  If you are 34 or older, have a CBE every year. Also consider having a breast X-ray (mammogram) every year.  If you have a family history of breast cancer, talk to your health care provider about genetic screening.  If you are at high risk for breast cancer, talk to your health care provider about having an MRI and a mammogram every year.  Breast cancer gene (BRCA) assessment is recommended for women who have family members with BRCA-related cancers. BRCA-related cancers include:  Breast.  Ovarian.  Tubal.  Peritoneal cancers.  Results of the assessment will determine the need for genetic counseling and BRCA1 and BRCA2 testing. Cervical Cancer Your health care provider may recommend that you be screened regularly for cancer of the pelvic organs (ovaries, uterus, and vagina). This screening involves a pelvic examination, including checking for microscopic changes to the surface of your  cervix (Pap test). You may be encouraged to have this screening done every 3 years, beginning at age 85.  For women ages 66-65, health care providers may recommend pelvic exams and Pap testing every 3 years, or they may recommend the Pap and pelvic exam, combined with testing for human papilloma virus (HPV), every 5 years. Some types of HPV increase your risk of cervical cancer. Testing for HPV may also be done on women of any age with unclear Pap test results.  Other health care providers may not recommend any screening for nonpregnant women who are considered low risk for pelvic cancer and who do not have symptoms. Ask your health care provider if a screening pelvic exam is right for you.  If you have had past treatment for cervical cancer or a condition that could lead to cancer, you need Pap tests and screening for cancer for at least 20 years after your treatment. If Pap tests have been discontinued, your risk factors (such as having a new sexual partner) need to be reassessed to determine if screening should resume. Some women have medical problems that increase the chance of getting cervical cancer. In these cases, your health care provider may recommend more frequent screening and Pap tests. Colorectal Cancer  This type of cancer can be detected and often prevented.  Routine colorectal cancer screening usually begins at 80 years of age and continues through 80 years of age.  Your health care provider may recommend screening at an earlier age  if you have risk factors for colon cancer.  Your health care provider may also recommend using home test kits to check for hidden blood in the stool.  A small camera at the end of a tube can be used to examine your colon directly (sigmoidoscopy or colonoscopy). This is done to check for the earliest forms of colorectal cancer.  Routine screening usually begins at age 49.  Direct examination of the colon should be repeated every 5-10 years through 80  years of age. However, you may need to be screened more often if early forms of precancerous polyps or small growths are found. Skin Cancer  Check your skin from head to toe regularly.  Tell your health care provider about any new moles or changes in moles, especially if there is a change in a mole's shape or color.  Also tell your health care provider if you have a mole that is larger than the size of a pencil eraser.  Always use sunscreen. Apply sunscreen liberally and repeatedly throughout the day.  Protect yourself by wearing long sleeves, pants, a wide-brimmed hat, and sunglasses whenever you are outside. HEART DISEASE, DIABETES, AND HIGH BLOOD PRESSURE   High blood pressure causes heart disease and increases the risk of stroke. High blood pressure is more likely to develop in:  People who have blood pressure in the high end of the normal range (130-139/85-89 mm Hg).  People who are overweight or obese.  People who are African American.  If you are 50-11 years of age, have your blood pressure checked every 3-5 years. If you are 102 years of age or older, have your blood pressure checked every year. You should have your blood pressure measured twice--once when you are at a hospital or clinic, and once when you are not at a hospital or clinic. Record the average of the two measurements. To check your blood pressure when you are not at a hospital or clinic, you can use:  An automated blood pressure machine at a pharmacy.  A home blood pressure monitor.  If you are between 74 years and 31 years old, ask your health care provider if you should take aspirin to prevent strokes.  Have regular diabetes screenings. This involves taking a blood sample to check your fasting blood sugar level.  If you are at a normal weight and have a low risk for diabetes, have this test once every three years after 80 years of age.  If you are overweight and have a high risk for diabetes, consider being  tested at a younger age or more often. PREVENTING INFECTION  Hepatitis B  If you have a higher risk for hepatitis B, you should be screened for this virus. You are considered at high risk for hepatitis B if:  You were born in a country where hepatitis B is common. Ask your health care provider which countries are considered high risk.  Your parents were born in a high-risk country, and you have not been immunized against hepatitis B (hepatitis B vaccine).  You have HIV or AIDS.  You use needles to inject street drugs.  You live with someone who has hepatitis B.  You have had sex with someone who has hepatitis B.  You get hemodialysis treatment.  You take certain medicines for conditions, including cancer, organ transplantation, and autoimmune conditions. Hepatitis C  Blood testing is recommended for:  Everyone born from [birth year] through 1963.  Anyone with known risk factors for hepatitis C. Sexually transmitted  infections (STIs)  You should be screened for sexually transmitted infections (STIs) including gonorrhea and chlamydia if:  You are sexually active and are younger than 80 years of age.  You are older than 80 years of age and your health care provider tells you that you are at risk for this type of infection.  Your sexual activity has changed since you were last screened and you are at an increased risk for chlamydia or gonorrhea. Ask your health care provider if you are at risk.  If you do not have HIV, but are at risk, it may be recommended that you take a prescription medicine daily to prevent HIV infection. This is called pre-exposure prophylaxis (PrEP). You are considered at risk if:  You are sexually active and do not regularly use condoms or know the HIV status of your partner(s).  You take drugs by injection.  You are sexually active with a partner who has HIV. Talk with your health care provider about whether you are at high risk of being infected with HIV. If  you choose to begin PrEP, you should first be tested for HIV. You should then be tested every 3 months for as long as you are taking PrEP.  PREGNANCY   If you are premenopausal and you may become pregnant, ask your health care provider about preconception counseling.  If you may become pregnant, take 400 to 800 micrograms (mcg) of folic acid every day.  If you want to prevent pregnancy, talk to your health care provider about birth control (contraception). OSTEOPOROSIS AND MENOPAUSE   Osteoporosis is a disease in which the bones lose minerals and strength with aging. This can result in serious bone fractures. Your risk for osteoporosis can be identified using a bone density scan.  If you are 107 years of age or older, or if you are at risk for osteoporosis and fractures, ask your health care provider if you should be screened.  Ask your health care provider whether you should take a calcium or vitamin D supplement to lower your risk for osteoporosis.  Menopause may have certain physical symptoms and risks.  Hormone replacement therapy may reduce some of these symptoms and risks. Talk to your health care provider about whether hormone replacement therapy is right for you.  HOME CARE INSTRUCTIONS   Schedule regular health, dental, and eye exams.  Stay current with your immunizations.   Do not use any tobacco products including cigarettes, chewing tobacco, or electronic cigarettes.  If you are pregnant, do not drink alcohol.  If you are breastfeeding, limit how much and how often you drink alcohol.  Limit alcohol intake to no more than 1 drink per day for nonpregnant women. One drink equals 12 ounces of beer, 5 ounces of wine, or 1 ounces of hard liquor.  Do not use street drugs.  Do not share needles.  Ask your health care provider for help if you need support or information about quitting drugs.  Tell your health care provider if you often feel depressed.  Tell your health  care provider if you have ever been abused or do not feel safe at home.   This information is not intended to replace advice given to you by your health care provider. Make sure you discuss any questions you have with your health care provider.   Document Released: 12/23/2010 Document Revised: 06/30/2014 Document Reviewed: 05/11/2013 Elsevier Interactive Patient Education 2016 [employer].  Hearing Loss Hearing loss is a partial or total loss of  the ability to hear. This can be temporary or permanent, and it can happen in one or both ears. Hearing loss may be referred to as deafness. Medical care is necessary to treat hearing loss properly and to prevent the condition from getting worse. Your hearing may partially or completely come back, depending on what caused your hearing loss and how severe it is. In some cases, hearing loss is permanent. CAUSES Common causes of hearing loss include:   Too much wax in the ear canal.   Infection of the ear canal or middle ear.   Fluid in the middle ear.   Injury to the ear or surrounding area.   An object stuck in the ear.   Prolonged exposure to loud sounds, such as music.  Less common causes of hearing loss include:   Tumors in the ear.   Viral or bacterial infections, such as meningitis.   A hole in the eardrum (perforated eardrum).  Problems with the hearing nerve that sends signals between the brain and the ear.  Certain medicines.  SYMPTOMS  Symptoms of this condition may include:  Difficulty telling the difference between sounds.  Difficulty following a conversation when there is background noise.  Lack of response to sounds in your environment. This may be most noticeable when you do not respond to startling sounds.  Needing to turn up the volume on the television, radio, etc.  Ringing in the ears.  Dizziness.  Pain in the ears. DIAGNOSIS This condition is diagnosed based on a physical exam and a hearing test  (audiometry). The audiometry test will be performed by a hearing specialist (audiologist). You may also be referred to an ear, nose, and throat (ENT) specialist (otolaryngologist).  TREATMENT Treatment for recent onset of hearing loss may include:   Ear wax removal.   Being prescribed medicines to prevent infection (antibiotics).   Being prescribed medicines to reduce inflammation (corticosteroids).  HOME CARE INSTRUCTIONS  If you were prescribed an antibiotic medicine, take it as told by your health care provider. Do not stop taking the antibiotic even if you start to feel better.  Take over-the-counter and prescription medicines only as told by your health care provider.  Avoid loud noises.   Return to your normal activities as told by your health care provider. Ask your health care provider what activities are safe for you.  Keep all follow-up visits as told by your health care provider. This is important. SEEK MEDICAL CARE IF:   You feel dizzy.   You develop new symptoms.   You vomit or feel nauseous.   You have a fever.  SEEK IMMEDIATE MEDICAL CARE IF:  You develop sudden changes in your vision.   You have severe ear pain.   You have new or increased weakness.  You have a severe headache.   This information is not intended to replace advice given to you by your health care provider. Make sure you discuss any questions you have with your health care provider.   Document Released: 06/09/2005 Document Revised: 02/28/2015 Document Reviewed: 10/25/2014 Elsevier Interactive Patient Education [employer].

## 2015-08-08 NOTE — Progress Notes (Signed)
   Subjective:    Patient ID: Paula Jimenez, female    DOB: 09/21/1926, 82 y.o.   MRN: [id number]  HPI    Review of Systems     Objective:   Physical Exam        Assessment & Plan:  I reviewed health advisor's note, was available for consultation, and agree with documentation and plan.

## 2015-08-09 NOTE — Progress Notes (Signed)
   Subjective:    Patient ID: Paula Jimenez, female    DOB: 02/02/1927, 82 y.o.   MRN: [id number]  HPI    Review of Systems     Objective:   Physical Exam        Assessment & Plan:  I reviewed health advisor's note, was available for consultation, and agree with documentation and plan.

## 2015-08-27 ENCOUNTER — Ambulatory Visit: Payer: Commercial Managed Care - HMO

## 2015-08-27 ENCOUNTER — Ambulatory Visit (INDEPENDENT_AMBULATORY_CARE_PROVIDER_SITE_OTHER): Payer: Commercial Managed Care - HMO

## 2015-08-27 DIAGNOSIS — N390 Urinary tract infection, site not specified: Secondary | ICD-10-CM | POA: Diagnosis not present

## 2015-08-27 LAB — URINALYSIS, COMPLETE
Bilirubin, UA: NEGATIVE
GLUCOSE, UA: NEGATIVE
KETONES UA: NEGATIVE
LEUKOCYTES UA: NEGATIVE
Nitrite, UA: NEGATIVE
PROTEIN UA: NEGATIVE
SPEC GRAV UA: 1.015 (ref 1.005–1.030)
Urobilinogen, Ur: 0.2 mg/dL (ref 0.2–1.0)
pH, UA: 5.5 (ref 5.0–7.5)

## 2015-08-27 LAB — MICROSCOPIC EXAMINATION
Bacteria, UA: NONE SEEN
Epithelial Cells (non renal): NONE SEEN /hpf (ref 0–10)
WBC, UA: NONE SEEN /hpf (ref 0–?)

## 2015-08-27 NOTE — Progress Notes (Signed)
In and Out Catheterization  Patient is present today for a I & O catheterization due to recurrent UTI. Patient was cleaned and prepped in a sterile fashion with betadine and Lidocaine 2% jelly was instilled into the urethra.  A 14FR cath was inserted no complications were noted , 70ml of urine return was noted, urine was clear and yellow in color. A clean urine sample was collected for u/a and cx. Bladder was drained  And catheter was removed with out difficulty.    Preformed by: Alferd Obryant, LPN   

## 2015-08-29 ENCOUNTER — Telehealth: Payer: Self-pay

## 2015-08-29 LAB — CULTURE, URINE COMPREHENSIVE

## 2015-08-29 NOTE — Telephone Encounter (Signed)
-----   Message from Festus Aloe, MD sent at 08/29/2015 12:47 PM EST ----- Notify patient urine culture negative.    ----- Message -----    From: Lestine Box, LPN    Sent: [id number]   8:30 AM      To: Festus Aloe, MD    ----- Message -----    From: [facility] Lab Results In Interface    Sent: 08/27/2015   1:37 PM      To: Rowe [facility]

## 2015-08-29 NOTE — Telephone Encounter (Signed)
LMOM- most recent labs are negative.  

## 2015-10-23 ENCOUNTER — Encounter: Payer: Self-pay | Admitting: Family Medicine

## 2015-10-23 ENCOUNTER — Ambulatory Visit (INDEPENDENT_AMBULATORY_CARE_PROVIDER_SITE_OTHER): Payer: Commercial Managed Care - HMO | Admitting: Family Medicine

## 2015-10-23 VITALS — BP 136/80 | HR 72 | Temp 97.6°F | Ht 65.0 in | Wt 122.5 lb

## 2015-10-23 DIAGNOSIS — R300 Dysuria: Secondary | ICD-10-CM | POA: Diagnosis not present

## 2015-10-23 DIAGNOSIS — N390 Urinary tract infection, site not specified: Secondary | ICD-10-CM | POA: Diagnosis not present

## 2015-10-23 DIAGNOSIS — E785 Hyperlipidemia, unspecified: Secondary | ICD-10-CM | POA: Diagnosis not present

## 2015-10-23 DIAGNOSIS — I10 Essential (primary) hypertension: Secondary | ICD-10-CM | POA: Diagnosis not present

## 2015-10-23 LAB — COMPREHENSIVE METABOLIC PANEL
ALT: 16 U/L (ref 0–35)
AST: 16 U/L (ref 0–37)
Albumin: 4.1 g/dL (ref 3.5–5.2)
Alkaline Phosphatase: 64 U/L (ref 39–117)
BILIRUBIN TOTAL: 0.4 mg/dL (ref 0.2–1.2)
BUN: 10 mg/dL (ref 6–23)
CALCIUM: 9.3 mg/dL (ref 8.4–10.5)
CO2: 31 meq/L (ref 19–32)
CREATININE: 0.57 mg/dL (ref 0.40–1.20)
Chloride: 98 mEq/L (ref 96–112)
GFR: 106.64 mL/min (ref >60.00)
GLUCOSE: 100 mg/dL — AB (ref 70–99)
Potassium: 4 mEq/L (ref 3.5–5.1)
Sodium: 136 mEq/L (ref 135–145)
Total Protein: 6.8 g/dL (ref 6.0–8.3)

## 2015-10-23 LAB — CBC WITH DIFFERENTIAL/PLATELET
BASOS ABS: 0 10*3/uL (ref 0.0–0.1)
Basophils Relative: 0.3 % (ref 0.0–3.0)
EOS ABS: 0 10*3/uL (ref 0.0–0.7)
Eosinophils Relative: 0.6 % (ref 0.0–5.0)
HEMATOCRIT: 39.8 % (ref 36.0–46.0)
Hemoglobin: 13.5 g/dL (ref 12.0–15.0)
LYMPHS PCT: 24.6 % (ref 12.0–46.0)
Lymphs Abs: 1.7 10*3/uL (ref 0.7–4.0)
MCHC: 33.8 g/dL (ref 30.0–36.0)
MCV: 92.8 fl (ref 78.0–100.0)
Monocytes Absolute: 0.6 10*3/uL (ref 0.1–1.0)
Monocytes Relative: 8.3 % (ref 3.0–12.0)
NEUTROS ABS: 4.6 10*3/uL (ref 1.4–7.7)
NEUTROS PCT: 66.2 % (ref 43.0–77.0)
PLATELETS: 192 10*3/uL (ref 150.0–400.0)
RBC: 4.29 Mil/uL (ref 3.87–5.11)
RDW: 13.4 % (ref 11.5–15.5)
WBC: 7 10*3/uL (ref 4.0–10.5)

## 2015-10-23 LAB — POC URINALSYSI DIPSTICK (AUTOMATED)
BILIRUBIN UA: NEGATIVE
Glucose, UA: NEGATIVE
Ketones, UA: NEGATIVE
NITRITE UA: NEGATIVE
PH UA: 6
Spec Grav, UA: 1.01
UROBILINOGEN UA: 0.2

## 2015-10-23 LAB — LIPID PANEL
Cholesterol: 160 mg/dL (ref 0–200)
HDL: 55.6 mg/dL (ref >39.00)
LDL CALC: 78 mg/dL (ref 0–99)
NONHDL: 104.47
Total CHOL/HDL Ratio: 3
Triglycerides: 131 mg/dL (ref 0.0–149.0)
VLDL: 26.2 mg/dL (ref 0.0–40.0)

## 2015-10-23 LAB — TSH: TSH: 0.73 u[IU]/mL (ref 0.35–4.50)

## 2015-10-23 MED ORDER — SULFAMETHOXAZOLE-TRIMETHOPRIM 400-80 MG PO TABS: 1.0000 | ORAL_TABLET | Freq: Two times a day (BID) | ORAL | Status: DC

## 2015-10-23 MED ORDER — ATENOLOL 50 MG PO TABS: 50.0000 mg | ORAL_TABLET | Freq: Every day | ORAL | Status: DC

## 2015-10-23 MED ORDER — SIMVASTATIN 20 MG PO TABS: 20.0000 mg | ORAL_TABLET | Freq: Every day | ORAL | Status: DC

## 2015-10-23 MED ORDER — SULFAMETHOXAZOLE-TRIMETHOPRIM 800-160 MG PO TABS: 1.0000 | ORAL_TABLET | Freq: Two times a day (BID) | ORAL | Status: DC

## 2015-10-23 NOTE — Progress Notes (Signed)
Subjective:    Patient ID: Paula Jimenez, female    DOB: [birth year], 82 y.o.   MRN: [id number]  HPI Here for f/u of chronic health problems incl utis  Wt is down 1 lb  Thinks she is eating enough-feels like she gained wt  She is working outside [employer] exercise - enjoys that  bmi of 20  bp is stable today  No cp or palpitations or headaches or edema  No side effects to medicines  BP Readings from Last 3 Encounters:  10/23/15 136/84  08/07/15 134/80  07/30/15 167/80     On zoxor and diet for lipids Lab Results  Component Value Date   CHOL 152 10/18/2014   HDL 52.40 10/18/2014   LDLCALC 71 10/18/2014   LDLDIRECT 159.5 02/19/2009   TRIG 143.0 10/18/2014   CHOLHDL 3 10/18/2014   due for a check Has been eating what she wants  Does not generally eat greasy foods  Once in a while steak or fried chicken   Hx of frequent uti Also hematuria-neg w/u with Ct and cystoscopy at [facility] atrophy and also urethral caruncle-tx with est cream  Also a cystocele  Does not have a f/u appt   Woke up yesterday am with urinary pain Pressure over bladder  Burning to urinate  No blood in urine  Not on any prophylactic abx Last took a sulfa abx in 2/17 (turns out she is not allergic)   UA - pos for blood cells and white cells     Patient Active Problem List   Diagnosis Date Noted  . SOB (shortness of breath) on exertion 05/01/2015  . Pre-operative cardiovascular examination 05/01/2015  . Frequent UTI 02/16/2015  . Cough 11/12/2014  . Epistaxis, recurrent 02/28/2014  . History of shingles 02/20/2014  . Dizziness 01/06/2014  . Hypoglycemia 06/22/2013  . Colon cancer screening 11/19/2011  . HEARING LOSS, BILATERAL 09/23/2007  . History of melanoma 02/25/2007  . Hyperlipidemia 02/25/2007  . MITRAL VALVE PROLAPSE 02/25/2007  . ALLERGIC RHINITIS 02/25/2007  . ASTHMA 02/25/2007  . DEGENERATIVE DISC DISEASE, LUMBOSACRAL SPINE 02/25/2007  . OSTEOPOROSIS 02/25/2007  .  HYPERTENSION, BENIGN 10/26/2006   Past Medical History  Diagnosis Date  . Allergy   . Hyperlipidemia   . Osteoporosis   . Hypertension   . Bursitis, hip   . Osteopenia 08/01  . HYPERTENSION, BENIGN 10/26/2006    Qualifier: Diagnosis of  By: Selinda Orion    . MITRAL VALVE PROLAPSE 02/25/2007    Qualifier: History of  By: Marcelino Scot CMA, [place] Bilberry    . Family history of adverse reaction to anesthesia     DAUGHTER had a reaction to ansethesia  . Asthma     years ago, 2-3 times a year pt experiences a problem with breathing.  . Skin cancer 2015    nose   Past Surgical History  Procedure Laterality Date  . Orif radial fracture Left 11/99    arm fracture, radial no surgery  . Breast biopsy Right 06/99    fibrocystic  . Abdominal hysterectomy  1971  . Eye surgery Bilateral 2011    June and August of 2011  . Tonsillectomy    . Cystoscopy with biopsy N/A 05/31/2015    Procedure: CYSTOSCOPY WITH BIOPSY/ fulgeration;  Surgeon: Hollice Espy, MD;  Location: [facility] ORS;  Service: Urology;  Laterality: N/A;   Social History  Substance Use Topics  . Smoking status: Never Smoker   . Smokeless tobacco: None  .  Alcohol Use: No   Family History  Problem Relation Age of Onset  . Leukemia Sister   . Cancer Sister     leukemia  . Heart disease Mother   . Diabetes Mother   . Stroke Father   . Heart failure Mother    Allergies  Allergen Reactions  . Clindamycin/Lincomycin Swelling    Throat swelling  . Codeine     Unknown per pt   . Levofloxacin     Unknown per pt   . Rofecoxib     Unknown per pt   . Valtrex [Valacyclovir Hcl]     Unknown per pt   . Adhesive [Tape] Rash  . Ciprofloxacin Diarrhea  . Nitrofurantoin Nausea Only  . Penicillins Swelling    mouth swelling  . Raloxifene Rash  . Tramadol     dizzy   Current Outpatient Prescriptions on File Prior to Visit  Medication Sig Dispense Refill  . Alum Hydroxide-Mag Carbonate (GAVISCON EXTRA STRENGTH) 160-105 MG CHEW  Chew 1 tablet by mouth daily as needed (for heartburn).     . Ascorbic Acid (VITAMIN C WITH ROSE HIPS) 1000 MG tablet Take 1,000 mg by mouth daily.    Skipper Cliche SALINE NASAL NA Place 1 spray into the nose daily as needed (allergies).     . clotrimazole-betamethasone (LOTRISONE) cream Apply 1 application topically as needed.    [name] estradiol (ESTRACE) 0.1 MG/GM vaginal cream Vaginal Estrogen Cream apply a blueberry sized amount to vaginal opening using finger tip nightly for 2 weeks then use 3 times weekly before bed. 42.5 g 6  . fish oil-omega-3 fatty acids 1000 MG capsule Take 1 g by mouth daily.      [name] loperamide (IMODIUM) 2 MG capsule Take 2 mg by mouth as needed for diarrhea or loose stools (has been taking with Clindamycin).    [name] loratadine (CLARITIN) 10 MG tablet Take 10 mg by mouth as needed for allergies or rhinitis.     . Multiple Vitamins-Minerals (ONE-A-DAY WOMENS PETITES PO) Take 1 tablet by mouth every morning. Reported on 07/30/2015    . VITAMIN D, CHOLECALCIFEROL, PO Take 1 tablet by mouth daily. 5000 IU IN AM     No current facility-administered medications on file prior to visit.    Review of Systems Review of Systems  Constitutional: Negative for fever, appetite change, fatigue and unexpected weight change.  Eyes: Negative for pain and visual disturbance.  Respiratory: Negative for cough and shortness of breath.   Cardiovascular: Negative for cp or palpitations    Gastrointestinal: Negative for nausea, diarrhea and constipation.  Genitourinary: pos for urgency and frequency. pos for dysuria/ neg for gross hematuria  Skin: Negative for pallor or rash   Neurological: Negative for weakness, light-headedness, numbness and headaches.  Hematological: Negative for adenopathy. Does not bruise/bleed easily.  Psychiatric/Behavioral: Negative for dysphoric mood. The patient is not nervous/anxious.         Objective:   Physical Exam  Constitutional: She appears well-developed and  well-nourished. No distress.  Well appearing elderly female  HENT:  Head: Normocephalic and atraumatic.  Mouth/Throat: Oropharynx is clear and moist.  Eyes: Conjunctivae and EOM are normal. Pupils are equal, round, and reactive to light.  Neck: Normal range of motion. Neck supple. No JVD present. Carotid bruit is not present. No thyromegaly present.  Cardiovascular: Normal rate, regular rhythm, normal heart sounds and intact distal pulses.  Exam reveals no gallop.   No M heard today  Pulmonary/Chest: Effort normal and  breath sounds normal. No respiratory distress. She has no wheezes. She has no rales.  No crackles  Abdominal: Soft. Bowel sounds are normal. She exhibits no distension, no abdominal bruit and no mass. There is no tenderness. There is no rebound.  No cva tenderness  Mild suprapubic tenderness  Musculoskeletal: She exhibits no edema.  Lymphadenopathy:    She has no cervical adenopathy.  Neurological: She is alert. She has normal reflexes. No cranial nerve deficit. She exhibits normal muscle tone. Coordination normal.  Skin: Skin is warm and dry. No rash noted.  Psychiatric: She has a normal mood and affect.  pleasant          Assessment & Plan:   Problem List Items Addressed This Visit      Cardiovascular and Mediastinum   HYPERTENSION, BENIGN    bp in fair control at this time  BP Readings from Last 1 Encounters:  10/23/15 136/80   No changes needed Disc lifstyle change with low sodium diet and exercise  Labs today  No change in therapy      Relevant Medications   simvastatin (ZOCOR) 20 MG tablet   atenolol (TENORMIN) 50 MG tablet   Other Relevant Orders   CBC with Differential/Platelet (Completed)   Comprehensive metabolic panel (Completed)   TSH (Completed)   Lipid panel (Completed)     Genitourinary   Frequent UTI    Reviewed last urology notes and studies tx with bactrm (had this last-not allergic) Urine sent for cx Will cc to urology Enc  water intake       Relevant Medications   sulfamethoxazole-trimethoprim (BACTRIM DS,SEPTRA DS) 800-160 MG tablet   Other Relevant Orders   Urine culture     Other   Hyperlipidemia    Labs today for lipids On simvastatin Does not watch diet well  Disc goals for lipids and reasons to control them Rev labs with pt (from last check) Rev low sat fat diet in detail       Relevant Medications   simvastatin (ZOCOR) 20 MG tablet   atenolol (TENORMIN) 50 MG tablet   Other Relevant Orders   Lipid panel (Completed)    Other Visit Diagnoses    Dysuria    -  Primary    Relevant Orders    POCT Urinalysis Dipstick (Automated) (Completed)

## 2015-10-23 NOTE — Patient Instructions (Addendum)
Continue current medicines  Labs today  We will culture your urine today  Take the antibiotic - bactrim    You saw Dr Johnsie Cancel in the past for cardiology

## 2015-10-23 NOTE — Assessment & Plan Note (Signed)
Reviewed last urology notes and studies tx with bactrm (had this last-not allergic) Urine sent for cx Will cc to urology Enc water intake

## 2015-10-23 NOTE — Assessment & Plan Note (Signed)
Labs today for lipids On simvastatin Does not watch diet well  Disc goals for lipids and reasons to control them Rev labs with pt (from last check) Rev low sat fat diet in detail

## 2015-10-23 NOTE — Assessment & Plan Note (Signed)
bp in fair control at this time  BP Readings from Last 1 Encounters:  10/23/15 136/80   No changes needed Disc lifstyle change with low sodium diet and exercise  Labs today  No change in therapy

## 2015-10-23 NOTE — Progress Notes (Signed)
Pre visit review using our clinic review tool, if applicable. No additional management support is needed unless otherwise documented below in the visit note. 

## 2015-10-24 ENCOUNTER — Encounter: Payer: Self-pay | Admitting: *Deleted

## 2015-10-26 LAB — URINE CULTURE

## 2015-11-07 ENCOUNTER — Ambulatory Visit: Payer: Commercial Managed Care - HMO | Admitting: Urology

## 2015-11-27 ENCOUNTER — Telehealth: Payer: Self-pay | Admitting: Family Medicine

## 2015-11-27 NOTE — Telephone Encounter (Signed)
Noted  

## 2015-11-27 NOTE — Telephone Encounter (Signed)
[facility]     Patient Name: Paula Jimenez Client [facility] Day - Client    Client Site [place] MD    Contact Type Call    Who Is Calling Patient / Member / Family / Caregiver    Call Type Triage / Clinical    Caller Name Kelsey Soldan    Relationship To Patient Daughter    Return Phone Number [phone] (Primary)    Chief Complaint Heart palpitations or irregular heartbeat  Gender: Female Reason for Call Symptomatic / Request for Health Information  DOB: 1927/05/25  Initial Comment Caller states while mother was sitting on the side of bed trying to get dress when she broke out in a sweat and having irregular heartbeats.   Age: 19 Y 28 D Appointment Disposition EMR Appointment Scheduled  Return Phone Number: [phone] (Primary), [phone] (Secondary), [phone] (Alternate) Info pasted into Epic Yes  Address: [place]  PreDisposition 911  City/State/Zip: [place] [address] Translation No    Nurse Assessment  Nurse: Amalia Hailey, RN, Melissa Date/Time (Eastern Time): 11/27/2015 3:35:36 PM  Confirm and document reason for call. If symptomatic, describe symptoms. You must click the next button to save text entered. ---Caller states while mother was sitting on the side of bed trying to get dress when she broke out in a sweat and having irregular heartbeats.  Has the patient traveled out of the country within the last 30 days? ---Not Applicable  Does the patient have any new or worsening symptoms? ---Yes  Will a triage be completed? ---Yes  Related visit to physician within the last 2 weeks? ---No  Does the PT have any chronic conditions? (i.e. diabetes, asthma, etc.) ---Yes  List chronic conditions. ---hypertension, high cholesterol,  Is this a behavioral health or substance abuse call? ---No     Guidelines      Guideline Title Affirmed Question Affirmed Notes Nurse Date/Time (Eastern Time)  Heart Rate and Heartbeat Questions [1] Heart beating very rapidly (e.g., > 140 / minute) AND [2] not present now (Exception: during exercise)  Amalia Hailey, RN, Melissa 11/27/2015 3:39:30 PM  Disp. Time Eilene Ghazi Time) Disposition Final User         11/27/2015 3:52:04 PM See Physician within 4 Hours (or PCP triage) Yes Amalia Hailey, RN, Lenna Sciara         Caller Understands: Yes   Disagree/Comply: Comply      Care Advice Given Per Guideline         SEE PHYSICIAN WITHIN 4 HOURS (or PCP triage): CALL BACK IF: * You become worse.           Comments  User: Colin Ina, RN Date/Time Eilene Ghazi Time): 11/27/2015 3:54:52 PM  Caller reports she requested an appt. with the office, wanted to be seen in office rather then go to UC/ED. Caller advised of recommendation and verbalized understood recommendation to be seen in 3-4 hrs and that there are no appts with the office for this afternoon to be seen.  Referrals   [facility]

## 2015-11-27 NOTE — Telephone Encounter (Signed)
Appointment tomorrow 6/7 at 1000 [address] Gentry Fitz, NP.

## 2015-11-27 NOTE — Telephone Encounter (Signed)
We please see if Paula Jimenez would mind coming in at 12:15 rather than at 10 AM. This appointment will likely take more than 15 minutes

## 2015-11-27 NOTE — Telephone Encounter (Signed)
Spoken and notified patient of Kate's comments. Patient verbalized understanding. Appt has been change to 12:15 pm

## 2015-11-28 ENCOUNTER — Ambulatory Visit (INDEPENDENT_AMBULATORY_CARE_PROVIDER_SITE_OTHER): Payer: Commercial Managed Care - HMO | Admitting: Primary Care

## 2015-11-28 ENCOUNTER — Encounter: Payer: Self-pay | Admitting: Primary Care

## 2015-11-28 VITALS — BP 134/80 | HR 66 | Temp 98.6°F | Ht 65.0 in | Wt 122.0 lb

## 2015-11-28 DIAGNOSIS — R002 Palpitations: Secondary | ICD-10-CM

## 2015-11-28 NOTE — Progress Notes (Signed)
Subjective:    Patient ID: Paula Jimenez, female    DOB: Feb 16, 1927, 82 y.o.   MRN: [id number]  HPI  Paula Jimenez is an 82 year old female who presents today with a chief complaint of palpitations. She called into team health yesterday with complaints of palpitations and sweats that occurred in the morning after waking on 11/27/15.   She had a dream just prior to her symptoms that scared her. She's also undergoing an increased amount of stress with family and personal life. She's not experienced sweats or palpitations since. Denies chest pain, dizziness, weakness, fatigue.  She did noticed some lower back pain that occurred yesterday after leaning forward when picking up a towel in her bathroom. Today she's noticed improvement in her back discomfort. She's taken tylenol with improvement.   Review of Systems  Constitutional: Negative for fatigue.  Respiratory: Negative for shortness of breath.   Cardiovascular: Negative for chest pain and palpitations.  Musculoskeletal:       Low back pain, overall improved.   Neurological: Negative for dizziness, weakness and light-headedness.       Past Medical History  Diagnosis Date  . Allergy   . Hyperlipidemia   . Osteoporosis   . Hypertension   . Bursitis, hip   . Osteopenia 08/01  . HYPERTENSION, BENIGN 10/26/2006    Qualifier: Diagnosis of  By: Selinda Orion    . MITRAL VALVE PROLAPSE 02/25/2007    Qualifier: History of  By: Marcelino Scot CMA, [place] Bilberry    . Family history of adverse reaction to anesthesia     DAUGHTER had a reaction to ansethesia  . Asthma     years ago, 2-3 times a year pt experiences a problem with breathing.  . Skin cancer 2015    nose     Social History   Social History  . Marital Status: Widowed    Spouse Name: N/A  . Number of Children: 4  . Years of Education: N/A   Occupational History  . retired    Social History Main Topics  . Smoking status: Never Smoker   . Smokeless tobacco: Not on file  .  Alcohol Use: No  . Drug Use: No  . Sexual Activity: No   Other Topics Concern  . Not on file   Social History Narrative   caring for daughter with leg injury   caring for sister with leukemia           Past Surgical History  Procedure Laterality Date  . Orif radial fracture Left 11/99    arm fracture, radial no surgery  . Breast biopsy Right 06/99    fibrocystic  . Abdominal hysterectomy  1971  . Eye surgery Bilateral 2011    June and August of 2011  . Tonsillectomy    . Cystoscopy with biopsy N/A 05/31/2015    Procedure: CYSTOSCOPY WITH BIOPSY/ fulgeration;  Surgeon: Hollice Espy, MD;  Location: [facility] ORS;  Service: Urology;  Laterality: N/A;    Family History  Problem Relation Age of Onset  . Leukemia Sister   . Cancer Sister     leukemia  . Heart disease Mother   . Diabetes Mother   . Stroke Father   . Heart failure Mother     Allergies  Allergen Reactions  . Clindamycin/Lincomycin Swelling    Throat swelling  . Codeine     Unknown per pt   . Levofloxacin     Unknown per pt   . Rofecoxib  Unknown per pt   . Valtrex [Valacyclovir Hcl]     Unknown per pt   . Adhesive [Tape] Rash  . Ciprofloxacin Diarrhea  . Nitrofurantoin Nausea Only  . Penicillins Swelling    mouth swelling  . Raloxifene Rash  . Tramadol     dizzy    Current Outpatient Prescriptions on File Prior to Visit  Medication Sig Dispense Refill  . Alum Hydroxide-Mag Carbonate (GAVISCON EXTRA STRENGTH) 160-105 MG CHEW Chew 1 tablet by mouth daily as needed (for heartburn).     . Ascorbic Acid (VITAMIN C WITH ROSE HIPS) 1000 MG tablet Take 1,000 mg by mouth daily.    [name] atenolol (TENORMIN) 50 MG tablet Take 1 tablet (50 mg total) by mouth daily. 30 tablet 11  . AYR SALINE NASAL NA Place 1 spray into the nose daily as needed (allergies).     . clotrimazole-betamethasone (LOTRISONE) cream Apply 1 application topically as needed.    [name] estradiol (ESTRACE) 0.1 MG/GM vaginal cream Vaginal  Estrogen Cream apply a blueberry sized amount to vaginal opening using finger tip nightly for 2 weeks then use 3 times weekly before bed. 42.5 g 6  . fish oil-omega-3 fatty acids 1000 MG capsule Take 1 g by mouth daily.      [name] loperamide (IMODIUM) 2 MG capsule Take 2 mg by mouth as needed for diarrhea or loose stools (has been taking with Clindamycin).    [name] loratadine (CLARITIN) 10 MG tablet Take 10 mg by mouth as needed for allergies or rhinitis.     . Multiple Vitamins-Minerals (ONE-A-DAY WOMENS PETITES PO) Take 1 tablet by mouth every morning. Reported on 07/30/2015    . simvastatin (ZOCOR) 20 MG tablet Take 1 tablet (20 mg total) by mouth daily. 30 tablet 11  . VITAMIN D, CHOLECALCIFEROL, PO Take 1 tablet by mouth daily. 5000 IU IN AM     No current facility-administered medications on file prior to visit.    BP 134/80 mmHg  Pulse 66  Temp(Src) 98.6 F (37 C) (Oral)  Ht 5\' 5"  (1.651 m)  Wt 122 lb (55.339 kg)  BMI 20.30 kg/m2  SpO2 94%    Objective:   Physical Exam  Constitutional: She appears well-nourished.  Cardiovascular: Normal rate, regular rhythm and normal heart sounds.   Pulmonary/Chest: Effort normal and breath sounds normal.  Musculoskeletal:  Mild tenderness to lower back, good ROM, steady gait, negative straight leg raise.  Skin: Skin is warm and dry.  Psychiatric: She has a normal mood and affect.          Assessment & Plan:  Palpitations:  Occurred on 11/27/15 in the morning right after waking from a disturbing dream. She's not experienced these symptoms since. Denies chest pain, dizziness,weakness. Exam today unremarkable. Normal rate and rhythm, appears well. ECG: Rate of 68, regular rhythm, grossly unchanged from prior in November 2016. Suspect symptoms caused by dream coupled with increase stress in personal life. Discussed return precautions for return of symptoms.  Low Back Pain:  Since leaning forward yesterday. Overall improved with tylenol.    Exam unremarkable, good ROM. Negative straight leg raise.  Sheral Flow, NP

## 2015-11-28 NOTE — Patient Instructions (Signed)
Your ECG looks good.  Please notify myself or Dr. Glori Bickers if your symptoms return.    It was a pleasure meeting you!

## 2015-11-28 NOTE — Progress Notes (Signed)
Pre visit review using our clinic review tool, if applicable. No additional management support is needed unless otherwise documented below in the visit note. 

## 2015-12-31 ENCOUNTER — Telehealth: Payer: Self-pay | Admitting: Urology

## 2015-12-31 DIAGNOSIS — D3001 Benign neoplasm of right kidney: Secondary | ICD-10-CM

## 2015-12-31 NOTE — Telephone Encounter (Signed)
You wanted a 6 month ct scan on this patient but there in not an order for it. Her follow up appt in on 02-04-16 can you put the order in for a CT [address] abd and pelvis with and w/o diagnosis is D30.01   Thanks,   Sharyn Lull

## 2016-01-28 ENCOUNTER — Ambulatory Visit
Admission: RE | Admit: 2016-01-28 | Discharge: 2016-01-28 | Disposition: A | Discharge status: Home / Self Care | Payer: Commercial Managed Care - HMO | Source: Ambulatory Visit | Attending: Urology | Admitting: Urology

## 2016-01-28 ENCOUNTER — Telehealth: Payer: Self-pay

## 2016-01-28 DIAGNOSIS — D3001 Benign neoplasm of right kidney: Secondary | ICD-10-CM | POA: Insufficient documentation

## 2016-01-28 DIAGNOSIS — R911 Solitary pulmonary nodule: Secondary | ICD-10-CM | POA: Insufficient documentation

## 2016-01-28 DIAGNOSIS — K5730 Diverticulosis of large intestine without perforation or abscess without bleeding: Secondary | ICD-10-CM | POA: Diagnosis not present

## 2016-01-28 DIAGNOSIS — Z90710 Acquired absence of both cervix and uterus: Secondary | ICD-10-CM | POA: Insufficient documentation

## 2016-01-28 DIAGNOSIS — I700 Atherosclerosis of aorta: Secondary | ICD-10-CM | POA: Diagnosis not present

## 2016-01-28 LAB — POCT I-STAT CREATININE: Creatinine, Ser: 0.7 mg/dL (ref 0.44–1.00)

## 2016-01-28 MED ORDER — IOPAMIDOL (ISOVUE-370) INJECTION 76%
100.0000 mL | Freq: Once | INTRAVENOUS | Status: AC | PRN
  Administered 2016-01-28: 100 mL via INTRAVENOUS

## 2016-01-28 NOTE — Telephone Encounter (Signed)
Notify patient - AML (benign mass) in right kidney slightly larger. F/u as planned to review. -from Dr. Octavia Bruckner with pt in reference to AML. Pt voiced understanding.

## 2016-01-30 ENCOUNTER — Ambulatory Visit: Payer: Commercial Managed Care - HMO | Admitting: Urology

## 2016-02-03 NOTE — Progress Notes (Signed)
02/04/2016 10:18 AM   Paula Jimenez [birth year] [id number]  Referring provider: Abner Greenspan, MD [address] [place], [place] [address]  Chief Complaint  Patient presents with  . Recurrent UTI    follow up   . Results    CT    HPI: Patient is an 82 year old Caucasian female who presents with her daughter, [name], with a history of gross hematuria, right renal angiomyolipoma, vaginal atrophy with an urethral caruncle and a history of recurrent UTI's who presents today for follow up.  History of hematuria Patient completed hematuria work up in 05/2015 with CT Urogram and cystoscopy.  No malignancies were found.  No recent gross hematuria.  CATH UA today was unremarkable.  Right renal angiomyolipoma CT w/wo performed on 02/28/2016 noted slight interval enlargement of right renal angiomyolipoma.  I have personally reviewed the films.    Vaginal atrophy Patient is using the vaginal estrogen cream three nights weekly.  She has not experienced any rash or irritation.    History of recurrent UTI's Patient has had five documented UTI's over the last year.  These cultures have been positive for varying bacteria with varying resistance patterns.   Her CATH UA is unremarkable.  No symptoms at this time.    PMH: Past Medical History:  Diagnosis Date  . Allergy   . Asthma    years ago, 2-3 times a year pt experiences a problem with breathing.  . Bursitis, hip   . Family history of adverse reaction to anesthesia    DAUGHTER had a reaction to ansethesia  . Hyperlipidemia   . Hypertension   . HYPERTENSION, BENIGN 10/26/2006   Qualifier: Diagnosis of  By: Selinda Orion    . MITRAL VALVE PROLAPSE 02/25/2007   Qualifier: History of  By: Marcelino Scot CMA, [place] Bil[place]    . Osteopenia 08/01  . Osteoporosis   . Skin cancer 2015   nose    Surgical History: Past Surgical History:  Procedure Laterality Date  . ABDOMINAL HYSTERECTOMY  1971  . BREAST BIOPSY  Right 06/99   fibrocystic  . CYSTOSCOPY WITH BIOPSY N/A 05/31/2015   Procedure: CYSTOSCOPY WITH BIOPSY/ fulgeration;  Surgeon: Hollice Espy, MD;  Location: [facility] ORS;  Service: Urology;  Laterality: N/A;  . DENTAL SURGERY    . EYE SURGERY Bilateral 2011   June and August of 2011  . ORIF RADIAL FRACTURE Left 11/99   arm fracture, radial no surgery  . TONSILLECTOMY      Home Medications:    Medication List       Accurate as of 02/04/16 10:18 AM. Always use your most recent med list.          atenolol 50 MG tablet Commonly known as:  TENORMIN Take 1 tablet (50 mg total) by mouth daily.   AYR SALINE NASAL NA Place 1 spray into the nose daily as needed (allergies).   clotrimazole-betamethasone cream Commonly known as:  LOTRISONE Apply 1 application topically as needed.   estradiol 0.1 MG/GM vaginal cream Commonly known as:  ESTRACE Vaginal Estrogen Cream apply a blueberry sized amount to vaginal opening using finger tip nightly for 2 weeks then use 3 times weekly before bed.   fish oil-omega-3 fatty acids 1000 MG capsule Take 1 g by mouth daily.   GAVISCON EXTRA STRENGTH 160-105 MG Chew Generic drug:  Alum Hydroxide-Mag Carbonate Chew 1 tablet by mouth daily as needed (for heartburn).   loperamide 2 MG capsule Commonly  known as:  IMODIUM Take 2 mg by mouth as needed for diarrhea or loose stools (has been taking with Clindamycin).   loratadine 10 MG tablet Commonly known as:  CLARITIN Take 10 mg by mouth as needed for allergies or rhinitis.   ONE-A-DAY WOMENS PETITES PO Take 1 tablet by mouth every morning. Reported on 07/30/2015   simvastatin 20 MG tablet Commonly known as:  ZOCOR Take 1 tablet (20 mg total) by mouth daily.   vitamin C with rose hips 1000 MG tablet Take 1,000 mg by mouth daily.   VITAMIN D (CHOLECALCIFEROL) PO Take 1 tablet by mouth daily. 5000 IU IN AM       Allergies:  Allergies  Allergen Reactions  . Clindamycin/Lincomycin Swelling     Throat swelling  . Codeine     Unknown per pt   . Levofloxacin     Unknown per pt   . Rofecoxib     Unknown per pt   . Valtrex [Valacyclovir Hcl]     Unknown per pt   . Adhesive [Tape] Rash  . Ciprofloxacin Diarrhea  . Nitrofurantoin Nausea Only  . Penicillins Swelling    mouth swelling  . Raloxifene Rash  . Tramadol     dizzy    Family History: Family History  Problem Relation Age of Onset  . Leukemia Sister   . Cancer Sister     leukemia  . Heart disease Mother   . Diabetes Mother   . Heart failure Mother   . Stroke Father   . Kidney disease Neg Hx     Social History:  reports that she has never smoked. She has never used smokeless tobacco. She reports that she does not drink alcohol or use drugs.  ROS: UROLOGY Frequent Urination?: No Hard to postpone urination?: No Burning/pain with urination?: No Get up at night to urinate?: Yes Leakage of urine?: No Urine stream starts and stops?: No Trouble starting stream?: No Do you have to strain to urinate?: No Blood in urine?: No Urinary tract infection?: No Sexually transmitted disease?: No Injury to kidneys or bladder?: No Painful intercourse?: No Weak stream?: No Currently pregnant?: No Vaginal bleeding?: No Last menstrual period?: n  Gastrointestinal Nausea?: No Vomiting?: No Indigestion/heartburn?: No Diarrhea?: No Constipation?: No  Constitutional Fever: No Night sweats?: No Weight loss?: No Fatigue?: No  Skin Skin rash/lesions?: No Itching?: No  Eyes Blurred vision?: No Double vision?: No  Ears/Nose/Throat Sore throat?: No Sinus problems?: No  Hematologic/Lymphatic Swollen glands?: No Easy bruising?: Yes  Cardiovascular Leg swelling?: Yes Chest pain?: No  Respiratory Cough?: Yes Shortness of breath?: No  Endocrine Excessive thirst?: No  Musculoskeletal Back pain?: No Joint pain?: No  Neurological Headaches?: Yes Dizziness?: Yes  Psychologic Depression?:  No Anxiety?: No  Physical Exam: BP (!) 147/82   Pulse 76   Ht 5\' 5"  (1.651 m)   Wt 119 lb 4.8 oz (54.1 kg)   BMI 19.85 kg/m   Constitutional: Well nourished. Alert and oriented, No acute distress. HEENT: [place] AT, moist mucus membranes. Trachea midline, no masses. Cardiovascular: No clubbing, cyanosis, or edema. Respiratory: Normal respiratory effort, no increased work of breathing. GI: Abdomen is soft, non tender, non distended, no abdominal masses. Liver and spleen not palpable.  No hernias appreciated.  Stool sample for occult testing is not indicated.   GU: No CVA tenderness.  No bladder fullness or masses.  Atrophic external genitalia, normal pubic hair distribution, no lesions.  Urethral caruncle is noted ( 2 mm  x 5 mm).  No urethral masses, tenderness and/or tenderness. No bladder fullness, tenderness or masses. Normal vagina mucosa, good estrogen effect, no discharge, no lesions, good pelvic support, no cystocele or rectocele noted.  Cervix and uterus are surgically absent.  No pelvic masses or tenderness noted.  Anus and perineum are without rashes or lesions.    Skin: No rashes, bruises or suspicious lesions. Lymph: No cervical or inguinal adenopathy. Neurologic: Grossly intact, no focal deficits, moving all 4 extremities. Psychiatric: Normal mood and affect.  Laboratory Data: Lab Results  Component Value Date   WBC 7.0 10/23/2015   HGB 13.5 10/23/2015   HCT 39.8 10/23/2015   MCV 92.8 10/23/2015   PLT 192.0 10/23/2015    Lab Results  Component Value Date   CREATININE 0.70 01/28/2016    Lab Results  Component Value Date   TSH 0.73 10/23/2015       Component Value Date/Time   CHOL 160 10/23/2015 1502   HDL 55.60 10/23/2015 1502   CHOLHDL 3 10/23/2015 1502   VLDL 26.2 10/23/2015 1502   LDLCALC 78 10/23/2015 1502    Lab Results  Component Value Date   AST 16 10/23/2015   Lab Results  Component Value Date   ALT 16 10/23/2015    Urinalysis Unremarkable.   See EPIC.  Pertinent Imaging: CLINICAL DATA:  82 year old female with history of right sided renal angiomyolipoma discovered 6 months ago.  EXAM: CT ABDOMEN AND PELVIS WITHOUT AND WITH CONTRAST  TECHNIQUE: Multidetector CT imaging of the abdomen and pelvis was performed following the standard protocol before and following the bolus administration of intravenous contrast.  CONTRAST:  100 mL of Isovue 370.  COMPARISON:  CT the abdomen and pelvis 04/04/2015.  FINDINGS: Lower chest: 2 mm right lower lobe nodule (image 13 of series 10), unchanged in retrospect compared to the prior study, statistically likely benign. Mild scarring in the visualize lung bases. Severe calcifications of the mitral annulus. Thoracic aortic atherosclerosis.  Hepatobiliary: In the periphery of the right lobe of the liver there is a tiny 6 mm hypervascular area between segments 5 and 6 (image 30 of series 3) which is visualized only on the arterial phase of the examination, favored to reflect a transient hepatic attenuation difference (THAD, a benign perfusion anomaly). No other suspicious cystic or solid hepatic lesions. No intra or extrahepatic biliary ductal dilatation. Gallbladder is normal in appearance.  Pancreas: No pancreatic mass. No pancreatic ductal dilatation. No pancreatic or peripancreatic fluid or inflammatory changes.  Spleen: Unremarkable.  Adrenals/Urinary Tract: In the medial aspect of the lower pole of the right kidney there is again a fatty attenuation heterogeneously enhancing lesion which appears slightly enlarged compared to the prior study, which currently measures 1.8 x 2.3 x 2.7 cm (axial image 61 of series 3 and coronal image 40 of series 7), as compared with 1.4 x 2.2 x 2.5 cm on prior study 04/04/2015. This lesion is completely in capsulated within Gerota's fascia, and is well separated from the right renal vein which is completely patent. Other sub cm  low-attenuation lesions in the kidneys bilaterally are too small to definitively characterize, but are favored to represent tiny cysts. No hydroureteronephrosis. Bilateral adrenal glands are normal in appearance. Urinary bladder is normal in appearance.  Stomach/Bowel: Normal appearance of the stomach. No pathologic dilatation of small bowel or colon. Numerous colonic diverticulae are noted, without surrounding inflammatory changes to suggest an acute diverticulitis at this time. The appendix is not confidently identified and may  be surgically absent. Regardless, there are no inflammatory changes noted adjacent to the cecum to suggest the presence of an acute appendicitis at this time.  Vascular/Lymphatic: Aortic atherosclerosis, without evidence of aneurysm or dissection in the abdominal or pelvic vasculature. No lymphadenopathy noted in the abdomen or pelvis.  Reproductive: Status post hysterectomy. Ovaries are not confidently identified may be surgically absent or atrophic.  Other: No significant volume of ascites.  No pneumoperitoneum.  Musculoskeletal: There are no aggressive appearing lytic or blastic lesions noted in the visualized portions of the skeleton.  IMPRESSION: 1. Slight interval enlargement of right renal angiomyolipoma, as discussed above. 2. Colonic diverticulosis without evidence of acute diverticulitis at this time. 3. Aortic atherosclerosis. 4. Additional incidental findings, as above.   Electronically Signed   By: [name] M.D.   On: 01/28/2016 08:55  Assessment & Plan:    1. History of hematuria  - completed hematuria work up in 05/2015, no findings of malignancies.  No recent hematuria.  CATH UA is unremarkable  - patient will contact the office if she experiencing gross hematuria  2. Right renal angiomyolipoma  - stable  - RUS in one year for surveillance  3. Vaginal atrophy  - continue vaginal estrogen cream, sample of  Estrace given  4. Urethral caruncle   - see above  5.  History of recurrent UTI's  - CATH UA is unremarkable at this time  - no symptoms at this time  - patient will contact the office if she should experience symptoms for a CATH UA    Return in about 1 year (around 02/03/2017) for RUS report, exam and symptoms recheck.  These notes generated with voice recognition software. I apologize for typographical errors.  Zara Council, [facility] [address], [place], [place] [address] [phone]

## 2016-02-04 ENCOUNTER — Encounter: Payer: Self-pay | Admitting: Urology

## 2016-02-04 ENCOUNTER — Ambulatory Visit (INDEPENDENT_AMBULATORY_CARE_PROVIDER_SITE_OTHER): Payer: Commercial Managed Care - HMO | Admitting: Urology

## 2016-02-04 VITALS — BP 147/82 | HR 76 | Ht 65.0 in | Wt 119.3 lb

## 2016-02-04 DIAGNOSIS — D3001 Benign neoplasm of right kidney: Secondary | ICD-10-CM

## 2016-02-04 DIAGNOSIS — N362 Urethral caruncle: Secondary | ICD-10-CM | POA: Diagnosis not present

## 2016-02-04 DIAGNOSIS — N952 Postmenopausal atrophic vaginitis: Secondary | ICD-10-CM | POA: Diagnosis not present

## 2016-02-04 DIAGNOSIS — Z87448 Personal history of other diseases of urinary system: Secondary | ICD-10-CM | POA: Diagnosis not present

## 2016-02-04 DIAGNOSIS — Z87440 Personal history of urinary (tract) infections: Secondary | ICD-10-CM | POA: Diagnosis not present

## 2016-02-04 DIAGNOSIS — N390 Urinary tract infection, site not specified: Secondary | ICD-10-CM | POA: Diagnosis not present

## 2016-02-04 LAB — URINALYSIS, COMPLETE
BILIRUBIN UA: NEGATIVE
Glucose, UA: NEGATIVE
Ketones, UA: NEGATIVE
LEUKOCYTES UA: NEGATIVE
Nitrite, UA: NEGATIVE
PH UA: 6 (ref 5.0–7.5)
PROTEIN UA: NEGATIVE
Urobilinogen, Ur: 0.2 mg/dL (ref 0.2–1.0)

## 2016-02-04 LAB — MICROSCOPIC EXAMINATION
Bacteria, UA: NONE SEEN
Epithelial Cells (non renal): NONE SEEN /hpf (ref 0–10)
WBC, UA: NONE SEEN /hpf (ref 0–?)

## 2016-02-04 NOTE — Progress Notes (Signed)
In and Out Catheterization  Patient is present today for a I & O catheterization due to recurrent uti.  Patient was cleaned and prepped in a sterile fashion with betadine and Lidocaine 2% jelly was instilled into the urethra.  A 14FR cath was inserted no complications were noted , 266ml of urine return was noted, urine was yellow and clear in color. A clean urine sample was collected for clean catch urine. Bladder was drained and catheter was removed with out difficulty.    Preformed by: Lyndee Hensen CMA

## 2016-03-07 DIAGNOSIS — D225 Melanocytic nevi of trunk: Secondary | ICD-10-CM | POA: Diagnosis not present

## 2016-03-07 DIAGNOSIS — Z85828 Personal history of other malignant neoplasm of skin: Secondary | ICD-10-CM | POA: Diagnosis not present

## 2016-03-07 DIAGNOSIS — L723 Sebaceous cyst: Secondary | ICD-10-CM | POA: Diagnosis not present

## 2016-03-07 DIAGNOSIS — L820 Inflamed seborrheic keratosis: Secondary | ICD-10-CM | POA: Diagnosis not present

## 2016-03-07 DIAGNOSIS — D1801 Hemangioma of skin and subcutaneous tissue: Secondary | ICD-10-CM | POA: Diagnosis not present

## 2016-03-07 DIAGNOSIS — D692 Other nonthrombocytopenic purpura: Secondary | ICD-10-CM | POA: Diagnosis not present

## 2016-03-07 DIAGNOSIS — L821 Other seborrheic keratosis: Secondary | ICD-10-CM | POA: Diagnosis not present

## 2016-04-21 ENCOUNTER — Ambulatory Visit (INDEPENDENT_AMBULATORY_CARE_PROVIDER_SITE_OTHER): Payer: Commercial Managed Care - HMO | Admitting: Family Medicine

## 2016-04-21 ENCOUNTER — Encounter: Payer: Self-pay | Admitting: Family Medicine

## 2016-04-21 VITALS — BP 140/70 | HR 80 | Temp 98.0°F | Ht 65.0 in | Wt 120.5 lb

## 2016-04-21 DIAGNOSIS — Z23 Encounter for immunization: Secondary | ICD-10-CM

## 2016-04-21 DIAGNOSIS — N390 Urinary tract infection, site not specified: Secondary | ICD-10-CM

## 2016-04-21 DIAGNOSIS — H6120 Impacted cerumen, unspecified ear: Secondary | ICD-10-CM | POA: Insufficient documentation

## 2016-04-21 DIAGNOSIS — R519 Headache, unspecified: Secondary | ICD-10-CM | POA: Insufficient documentation

## 2016-04-21 DIAGNOSIS — H6122 Impacted cerumen, left ear: Secondary | ICD-10-CM

## 2016-04-21 DIAGNOSIS — R42 Dizziness and giddiness: Secondary | ICD-10-CM | POA: Diagnosis not present

## 2016-04-21 DIAGNOSIS — Z87440 Personal history of urinary (tract) infections: Secondary | ICD-10-CM

## 2016-04-21 DIAGNOSIS — R51 Headache: Secondary | ICD-10-CM

## 2016-04-21 LAB — POC URINALSYSI DIPSTICK (AUTOMATED)
Bilirubin, UA: NEGATIVE
Glucose, UA: NEGATIVE
KETONES UA: NEGATIVE
Nitrite, UA: NEGATIVE
SPEC GRAV UA: 1.015
UROBILINOGEN UA: 0.2
pH, UA: 6

## 2016-04-21 MED ORDER — SULFAMETHOXAZOLE-TRIMETHOPRIM 400-80 MG PO TABS: 1.0000 | ORAL_TABLET | Freq: Two times a day (BID) | ORAL | 0 refills | Status: DC

## 2016-04-21 MED ORDER — FLUTICASONE PROPIONATE 50 MCG/ACT NA SUSP: 2.0000 | Freq: Every day | NASAL | 6 refills | Status: DC

## 2016-04-21 NOTE — Assessment & Plan Note (Signed)
Frontal area with pressure  Re assuring exam Add flonase Disc allergen avoidance  Continue claritin Alert [place] if s/s of infection F/u 1 mo

## 2016-04-21 NOTE — Assessment & Plan Note (Signed)
L ear  Recommend debrox or other cerumen dissolving agents  F/u 1 mo  Will irrigate if needed

## 2016-04-21 NOTE — Progress Notes (Signed)
Subjective:    Patient ID: Paula Jimenez, female    DOB: 10-24-1926, 81 y.o.   MRN: [id number]  HPI  Here for urinary symptoms and also some dizziness   Results for orders placed or performed in visit on 04/21/16  POCT Urinalysis Dipstick (Automated)  Result Value Ref Range   Color, UA Yellow    Clarity, UA Clear    Glucose, UA Negative    Bilirubin, UA Negative    Ketones, UA Negative    Spec Grav, UA 1.015    Blood, UA 10 Ery/uL    pH, UA 6.0    Protein, UA Trace    Urobilinogen, UA 0.2    Nitrite, UA Negative    Leukocytes, UA moderate (2+) (A) Negative   hx of recurrent uti  Last e coli tx with bactrim  Pan sensitive for the most part   She does not tend to get dysuria or frequency No hematuria this time  Just tired/dizzy/off in general and more easy to get confused   Would like a flu shot   Wt Readings from Last 3 Encounters:  04/21/16 120 lb 8 oz (54.7 kg)  02/04/16 119 lb 4.8 oz (54.1 kg)  11/28/15 122 lb (55.3 kg)   bmi is 20.0   Dizzy lately - feels like she is spinning (slowly)  No n/v  Comes in spells  Also headaches  Worse when she bends over (also if she turns over)  She has pressure and pain above eyes/in forehead  Has had inner ear problem in the past  Runny nose- always clear  Cough- a lot lately  No fever  Thinks she has allergies and post nasal   More memory problems over the past month as well  ? If rel to bladder infections   BP Readings from Last 3 Encounters:  04/21/16 140/70  02/04/16 (!) 147/82  11/28/15 134/80   Pulse Readings from Last 3 Encounters:  04/21/16 80  02/04/16 76  11/28/15 66    Patient Active Problem List   Diagnosis Date Noted  . Sinus headache 04/21/2016  . Cerumen impaction 04/21/2016  . SOB (shortness of breath) on exertion 05/01/2015  . Pre-operative cardiovascular examination 05/01/2015  . Frequent UTI 02/16/2015  . Cough 11/12/2014  . Epistaxis, recurrent 02/28/2014  . History of shingles  02/20/2014  . Dizziness 01/06/2014  . Hypoglycemia 06/22/2013  . Colon cancer screening 11/19/2011  . HEARING LOSS, BILATERAL 09/23/2007  . History of melanoma 02/25/2007  . Hyperlipidemia 02/25/2007  . MITRAL VALVE PROLAPSE 02/25/2007  . ALLERGIC RHINITIS 02/25/2007  . ASTHMA 02/25/2007  . DEGENERATIVE DISC DISEASE, LUMBOSACRAL SPINE 02/25/2007  . OSTEOPOROSIS 02/25/2007  . HYPERTENSION, BENIGN 10/26/2006   Past Medical History:  Diagnosis Date  . Allergy   . Asthma    years ago, 2-3 times a year pt experiences a problem with breathing.  . Bursitis, hip   . Family history of adverse reaction to anesthesia    DAUGHTER had a reaction to ansethesia  . Hyperlipidemia   . Hypertension   . HYPERTENSION, BENIGN 10/26/2006   Qualifier: Diagnosis of  By: Selinda Orion    . MITRAL VALVE PROLAPSE 02/25/2007   Qualifier: History of  By: Marcelino Scot CMA, [place] Bil[place]    . Osteopenia 08/01  . Osteoporosis   . Skin cancer 2015   nose   Past Surgical History:  Procedure Laterality Date  . ABDOMINAL HYSTERECTOMY  1971  . BREAST BIOPSY Right 06/99   fibrocystic  .  CYSTOSCOPY WITH BIOPSY N/A 05/31/2015   Procedure: CYSTOSCOPY WITH BIOPSY/ fulgeration;  Surgeon: Hollice Espy, MD;  Location: [facility] ORS;  Service: Urology;  Laterality: N/A;  . DENTAL SURGERY    . EYE SURGERY Bilateral 2011   June and August of 2011  . ORIF RADIAL FRACTURE Left 11/99   arm fracture, radial no surgery  . TONSILLECTOMY     Social History  Substance Use Topics  . Smoking status: Never Smoker  . Smokeless tobacco: Never Used  . Alcohol use No   Family History  Problem Relation Age of Onset  . Leukemia Sister   . Cancer Sister     leukemia  . Heart disease Mother   . Diabetes Mother   . Heart failure Mother   . Stroke Father   . Kidney disease Neg Hx    Allergies  Allergen Reactions  . Clindamycin/Lincomycin Swelling    Throat swelling  . Codeine     Unknown per pt   . Levofloxacin     Unknown per  pt   . Rofecoxib     Unknown per pt   . Valtrex [Valacyclovir Hcl]     Unknown per pt   . Adhesive [Tape] Rash  . Ciprofloxacin Diarrhea  . Nitrofurantoin Nausea Only  . Penicillins Swelling    mouth swelling  . Raloxifene Rash  . Tramadol     dizzy   Current Outpatient Prescriptions on File Prior to Visit  Medication Sig Dispense Refill  . Alum Hydroxide-Mag Carbonate (GAVISCON EXTRA STRENGTH) 160-105 MG CHEW Chew 1 tablet by mouth daily as needed (for heartburn).     . Ascorbic Acid (VITAMIN C WITH ROSE HIPS) 1000 MG tablet Take 1,000 mg by mouth daily.    [name] atenolol (TENORMIN) 50 MG tablet Take 1 tablet (50 mg total) by mouth daily. 30 tablet 11  . AYR SALINE NASAL NA Place 1 spray into the nose daily as needed (allergies).     . clotrimazole-betamethasone (LOTRISONE) cream Apply 1 application topically as needed.    [name] estradiol (ESTRACE) 0.1 MG/GM vaginal cream Vaginal Estrogen Cream apply a blueberry sized amount to vaginal opening using finger tip nightly for 2 weeks then use 3 times weekly before bed. 42.5 g 6  . fish oil-omega-3 fatty acids 1000 MG capsule Take 1 g by mouth daily.      [name] loperamide (IMODIUM) 2 MG capsule Take 2 mg by mouth as needed for diarrhea or loose stools (has been taking with Clindamycin).    [name] loratadine (CLARITIN) 10 MG tablet Take 10 mg by mouth as needed for allergies or rhinitis.     . Multiple Vitamins-Minerals (ONE-A-DAY WOMENS PETITES PO) Take 1 tablet by mouth every morning. Reported on 07/30/2015    . simvastatin (ZOCOR) 20 MG tablet Take 1 tablet (20 mg total) by mouth daily. 30 tablet 11  . VITAMIN D, CHOLECALCIFEROL, PO Take 1 tablet by mouth daily. 5000 IU IN AM     No current facility-administered medications on file prior to visit.      Review of Systems Review of Systems  Constitutional: Negative for fever, appetite change,  and unexpected weight change.  Eyes: Negative for pain and visual disturbance.  ENT pos for cong and  rhinorrhea with sinus pressure  Respiratory: Negative for cough and shortness of breath.   Cardiovascular: Negative for cp or palpitations    Gastrointestinal: Negative for nausea, diarrhea and constipation.  Genitourinary: Negative for urgency and frequency. neg for dysuria  Skin: Negative for pallor or rash   Neurological: Negative for weakness, numbness and pos for  headaches.  Hematological: Negative for adenopathy. Does not bruise/bleed easily.  Psychiatric/Behavioral: Negative for dysphoric mood. The patient is not nervous/anxious. Pos for worsening memory/occ confusion (brief and mild)         Objective:   Physical Exam  Constitutional: She is oriented to person, place, and time. She appears well-developed and well-nourished. No distress.  Well appearing elderly female  HENT:  Head: Normocephalic and atraumatic.  Right Ear: External ear normal.  Left Ear: External ear normal.  Nose: Nose normal.  Mouth/Throat: Oropharynx is clear and moist. No oropharyngeal exudate.  Mild frontal sinus tenderness No temporal tenderness  No TMJ tenderness  L ear canal- cerumen impaction  Eyes: Conjunctivae and EOM are normal. Pupils are equal, round, and reactive to light. Right eye exhibits no discharge. Left eye exhibits no discharge. No scleral icterus.  No nystagmus  Neck: Normal range of motion and full passive range of motion without pain. Neck supple. No JVD present. Carotid bruit is not present. No tracheal deviation present. No thyromegaly present.  Cardiovascular: Normal rate, regular rhythm and normal heart sounds.   No murmur heard. Pulmonary/Chest: Effort normal and breath sounds normal. No respiratory distress. She has no wheezes. She has no rales.  Abdominal: Soft. Bowel sounds are normal. She exhibits no distension and no mass. There is no tenderness.  No suprapubic tenderness or fullness   No cva tenderness   Musculoskeletal: She exhibits no edema or tenderness.    Lymphadenopathy:    She has no cervical adenopathy.  Neurological: She is alert and oriented to person, place, and time. She has normal strength and normal reflexes. She displays no atrophy and no tremor. No cranial nerve deficit or sensory deficit. She exhibits normal muscle tone. She displays a negative Romberg sign. Coordination and gait normal.  No focal cerebellar signs   Skin: Skin is warm and dry. No rash noted. No pallor.  Nl color and turgor   Psychiatric: She has a normal mood and affect. Her behavior is normal. Thought content normal.          Assessment & Plan:   Problem List Items Addressed This Visit      Nervous and Auditory   Cerumen impaction    L ear  Recommend debrox or other cerumen dissolving agents  F/u 1 mo  Will irrigate if needed         Genitourinary   Frequent UTI - Primary    Pos ua today  Not on prophylaxis  Some fatigue/confusion/dizziness  cx pending  Px septra ds  Enc water intake  F/u 1 mo for re check  Update if no imp      Relevant Medications   sulfamethoxazole-trimethoprim (BACTRIM) 400-80 MG tablet   Other Relevant Orders   Urine culture     Other   Dizziness    Acute on chronic (positional) Re assuring exam  Describes a sinus headache as well  Px flonase ns Continue claritin Allergen avoidance  Re check 1 mo or sooner if no imp      Sinus headache    Frontal area with pressure  Re assuring exam Add flonase Disc allergen avoidance  Continue claritin Alert [place] if s/s of infection F/u 1 mo        Other Visit Diagnoses    History of UTI       Relevant Medications   sulfamethoxazole-trimethoprim (BACTRIM) 400-80  MG tablet   Other Relevant Orders   POCT Urinalysis Dipstick (Automated) (Completed)   Need for influenza vaccination       Relevant Orders   Flu Vaccine QUAD 36+ mos IM (Completed)

## 2016-04-21 NOTE — Assessment & Plan Note (Signed)
Pos ua today  Not on prophylaxis  Some fatigue/confusion/dizziness  cx pending  Px septra ds  Enc water intake  F/u 1 mo for re check  Update if no imp

## 2016-04-21 NOTE — Assessment & Plan Note (Signed)
Acute on chronic (positional) Re assuring exam  Describes a sinus headache as well  Px flonase ns Continue claritin Allergen avoidance  Re check 1 mo or sooner if no imp

## 2016-04-21 NOTE — Patient Instructions (Signed)
Make an appt for an eye exam -if you need a referral please call us so we can do that  For sinuses / headache and dizziness - get flonase and start using it daily  Continue claritin if it helps Get debrox or another ear wax drop over the counter and use it in the left ear to soften up the wax  For your uti-take septra as directed  Drink water!!!  Follow up in a month for a re check  Flu shot today

## 2016-04-21 NOTE — Progress Notes (Signed)
Pre visit review using our clinic review tool, if applicable. No additional management support is needed unless otherwise documented below in the visit note. 

## 2016-04-22 ENCOUNTER — Telehealth: Payer: Self-pay | Admitting: *Deleted

## 2016-04-22 LAB — URINE CULTURE: Organism ID, Bacteria: NO GROWTH

## 2016-04-22 NOTE — Telephone Encounter (Signed)
Patient's daughter Massie Maroon) called stating that she had patient in yesterday and was told that she needs to see an eye doctor. Massie Maroon stated that she can get patient in with Dr. Tommy Rainwater 05/29/2016 and wants to make sure that is soon enough? Please let her know if she should try to get her in with someone sooner?

## 2016-04-22 NOTE — Telephone Encounter (Signed)
I think that is ok unless symptoms worsen - keep me updated

## 2016-04-22 NOTE — Telephone Encounter (Signed)
Daughter notified of Dr. Marliss Coots comments and verbalized understanding, daughter said med helped HA and pt seems stable but she will update [place] if anything changes

## 2016-05-01 ENCOUNTER — Ambulatory Visit: Payer: Commercial Managed Care - HMO | Admitting: Primary Care

## 2016-05-05 ENCOUNTER — Encounter: Payer: Self-pay | Admitting: Primary Care

## 2016-05-05 ENCOUNTER — Ambulatory Visit (INDEPENDENT_AMBULATORY_CARE_PROVIDER_SITE_OTHER): Payer: Commercial Managed Care - HMO | Admitting: Primary Care

## 2016-05-05 DIAGNOSIS — H6122 Impacted cerumen, left ear: Secondary | ICD-10-CM | POA: Diagnosis not present

## 2016-05-05 DIAGNOSIS — R51 Headache: Secondary | ICD-10-CM | POA: Diagnosis not present

## 2016-05-05 DIAGNOSIS — R519 Headache, unspecified: Secondary | ICD-10-CM

## 2016-05-05 DIAGNOSIS — R42 Dizziness and giddiness: Secondary | ICD-10-CM | POA: Diagnosis not present

## 2016-05-05 NOTE — Patient Instructions (Addendum)
Your ears look great!  Use the ear wax removal kit next time if you start to notice a decrease in hearing as before.  It was a pleasure to see you today!

## 2016-05-05 NOTE — Assessment & Plan Note (Signed)
Improved since last visit. Continue Flonase PRN.

## 2016-05-05 NOTE — Progress Notes (Signed)
Pre visit review using our clinic review tool, if applicable. No additional management support is needed unless otherwise documented below in the visit note. 

## 2016-05-05 NOTE — Progress Notes (Signed)
Subjective:    Patient ID: Paula Jimenez, female    DOB: May 31, 1927, 82 y.o.   MRN: [id number]  HPI  Paula Jimenez is an 82 year old female who presents today with a chief complaint of ear fullness. She was evaluated on 04/21/16 with left ear cerumen impaction. She was encouraged to use OTC debrox drops and follow up for irrigation if no improvement.   Since her last visit she's been using debrox drops and a wax removal kit. She has noticed moderate improvement in hearing, sinus pressure, and dizziness. She denies fevers, chills, cough, urinary symptoms.   Overall she's feeling well and has no complaints.  Review of Systems  Constitutional: Negative for fatigue and fever.  HENT: Negative for ear pain and sinus pressure.   Respiratory: Negative for cough.   Genitourinary: Negative for dysuria and frequency.  Neurological: Negative for dizziness.       Past Medical History:  Diagnosis Date  . Allergy   . Asthma    years ago, 2-3 times a year pt experiences a problem with breathing.  . Bursitis, hip   . Family history of adverse reaction to anesthesia    DAUGHTER had a reaction to ansethesia  . Hyperlipidemia   . Hypertension   . HYPERTENSION, BENIGN 10/26/2006   Qualifier: Diagnosis of  By: Selinda Orion    . MITRAL VALVE PROLAPSE 02/25/2007   Qualifier: History of  By: Marcelino Scot CMA, [place] Bilberry    . Osteopenia 08/01  . Osteoporosis   . Skin cancer 2015   nose     Social History   Social History  . Marital status: Widowed    Spouse name: N/A  . Number of children: 4  . Years of education: N/A   Occupational History  . retired    Social History Main Topics  . Smoking status: Never Smoker  . Smokeless tobacco: Never Used  . Alcohol use No  . Drug use: No  . Sexual activity: No   Other Topics Concern  . Not on file   Social History Narrative   caring for daughter with leg injury   caring for sister with leukemia           Past Surgical History:  Procedure  Laterality Date  . ABDOMINAL HYSTERECTOMY  1971  . BREAST BIOPSY Right 06/99   fibrocystic  . CYSTOSCOPY WITH BIOPSY N/A 05/31/2015   Procedure: CYSTOSCOPY WITH BIOPSY/ fulgeration;  Surgeon: Hollice Espy, MD;  Location: [facility] ORS;  Service: Urology;  Laterality: N/A;  . DENTAL SURGERY    . EYE SURGERY Bilateral 2011   June and August of 2011  . ORIF RADIAL FRACTURE Left 11/99   arm fracture, radial no surgery  . TONSILLECTOMY      Family History  Problem Relation Age of Onset  . Leukemia Sister   . Cancer Sister     leukemia  . Heart disease Mother   . Diabetes Mother   . Heart failure Mother   . Stroke Father   . Kidney disease Neg Hx     Allergies  Allergen Reactions  . Clindamycin/Lincomycin Swelling    Throat swelling  . Codeine     Unknown per pt   . Levofloxacin     Unknown per pt   . Rofecoxib     Unknown per pt   . Valtrex [Valacyclovir Hcl]     Unknown per pt   . Adhesive [Tape] Rash  . Ciprofloxacin Diarrhea  . Nitrofurantoin  Nausea Only  . Penicillins Swelling    mouth swelling  . Raloxifene Rash  . Tramadol     dizzy    Current Outpatient Prescriptions on File Prior to Visit  Medication Sig Dispense Refill  . Alum Hydroxide-Mag Carbonate (GAVISCON EXTRA STRENGTH) 160-105 MG CHEW Chew 1 tablet by mouth daily as needed (for heartburn).     . Ascorbic Acid (VITAMIN C WITH ROSE HIPS) 1000 MG tablet Take 1,000 mg by mouth daily.    [name] atenolol (TENORMIN) 50 MG tablet Take 1 tablet (50 mg total) by mouth daily. 30 tablet 11  . AYR SALINE NASAL NA Place 1 spray into the nose daily as needed (allergies).     . clotrimazole-betamethasone (LOTRISONE) cream Apply 1 application topically as needed.    [name] estradiol (ESTRACE) 0.1 MG/GM vaginal cream Vaginal Estrogen Cream apply a blueberry sized amount to vaginal opening using finger tip nightly for 2 weeks then use 3 times weekly before bed. 42.5 g 6  . fish oil-omega-3 fatty acids 1000 MG capsule Take 1 g by  mouth daily.      . fluticasone (FLONASE) 50 MCG/ACT nasal spray Place 2 sprays into both nostrils daily. 16 g 6  . loperamide (IMODIUM) 2 MG capsule Take 2 mg by mouth as needed for diarrhea or loose stools (has been taking with Clindamycin).    [name] loratadine (CLARITIN) 10 MG tablet Take 10 mg by mouth as needed for allergies or rhinitis.     . Multiple Vitamins-Minerals (ONE-A-DAY WOMENS PETITES PO) Take 1 tablet by mouth every morning. Reported on 07/30/2015    . simvastatin (ZOCOR) 20 MG tablet Take 1 tablet (20 mg total) by mouth daily. 30 tablet 11  . VITAMIN D, CHOLECALCIFEROL, PO Take 1 tablet by mouth daily. 5000 IU IN AM     No current facility-administered medications on file prior to visit.     BP 140/72   Pulse 69   Temp 97.5 F (36.4 C) (Oral)   Ht '5\' 5"'  (1.651 m)   Wt 123 lb 6.4 oz (56 kg)   SpO2 98%   BMI 20.53 kg/m    Objective:   Physical Exam  Constitutional: She appears well-nourished.  HENT:  Right Ear: Tympanic membrane and ear canal normal.  Left Ear: Tympanic membrane and ear canal normal.  Nose: Right sinus exhibits no maxillary sinus tenderness and no frontal sinus tenderness. Left sinus exhibits no maxillary sinus tenderness and no frontal sinus tenderness.  Mouth/Throat: Oropharynx is clear and moist.  No cerumen impaction bilaterally.   Eyes: Conjunctivae are normal.  Neck: Neck supple.  Cardiovascular: Normal rate and regular rhythm.   Pulmonary/Chest: Effort normal and breath sounds normal. She has no wheezes. She has no rales.  Lymphadenopathy:    She has no cervical adenopathy.  Skin: Skin is warm and dry.          Assessment & Plan:

## 2016-05-05 NOTE — Assessment & Plan Note (Signed)
No impaction noted to either ears. No s/s of acute infection. Feeling much better. Discussed future use of wax removal kit.

## 2016-05-05 NOTE — Assessment & Plan Note (Signed)
Improved since use of Flonase. Discussed terms of use. Exam today unremarkable.

## 2016-05-26 ENCOUNTER — Ambulatory Visit: Payer: Commercial Managed Care - HMO | Admitting: Family Medicine

## 2016-05-29 DIAGNOSIS — H26492 Other secondary cataract, left eye: Secondary | ICD-10-CM | POA: Diagnosis not present

## 2016-05-29 DIAGNOSIS — Z961 Presence of intraocular lens: Secondary | ICD-10-CM | POA: Diagnosis not present

## 2016-06-26 DIAGNOSIS — H26491 Other secondary cataract, right eye: Secondary | ICD-10-CM | POA: Diagnosis not present

## 2016-06-26 DIAGNOSIS — Z961 Presence of intraocular lens: Secondary | ICD-10-CM | POA: Diagnosis not present

## 2016-08-07 ENCOUNTER — Telehealth: Payer: Self-pay | Admitting: Family Medicine

## 2016-08-07 DIAGNOSIS — Z0000 Encounter for general adult medical examination without abnormal findings: Secondary | ICD-10-CM | POA: Insufficient documentation

## 2016-08-07 NOTE — Telephone Encounter (Signed)
-----   Message from Eustace Pen, LPN sent at [phone]  4:08 PM EST ----- Regarding: Labs 2/16 Please place lab orders. Thank you.

## 2016-08-08 ENCOUNTER — Ambulatory Visit (INDEPENDENT_AMBULATORY_CARE_PROVIDER_SITE_OTHER): Payer: Medicare HMO

## 2016-08-08 VITALS — BP 130/70 | HR 69 | Temp 97.8°F | Ht 64.0 in | Wt 120.2 lb

## 2016-08-08 DIAGNOSIS — Z0000 Encounter for general adult medical examination without abnormal findings: Secondary | ICD-10-CM | POA: Diagnosis not present

## 2016-08-08 LAB — LIPID PANEL
CHOL/HDL RATIO: 3
Cholesterol: 167 mg/dL (ref 0–200)
HDL: 54 mg/dL (ref >39.00)
LDL Cholesterol: 93 mg/dL (ref 0–99)
NonHDL: 113.23
Triglycerides: 101 mg/dL (ref 0.0–149.0)
VLDL: 20.2 mg/dL (ref 0.0–40.0)

## 2016-08-08 LAB — CBC WITH DIFFERENTIAL/PLATELET
BASOS ABS: 0 10*3/uL (ref 0.0–0.1)
Basophils Relative: 0.3 % (ref 0.0–3.0)
EOS ABS: 0 10*3/uL (ref 0.0–0.7)
Eosinophils Relative: 0.6 % (ref 0.0–5.0)
HCT: 40.5 % (ref 36.0–46.0)
Hemoglobin: 13.7 g/dL (ref 12.0–15.0)
LYMPHS ABS: 1.4 10*3/uL (ref 0.7–4.0)
LYMPHS PCT: 20.7 % (ref 12.0–46.0)
MCHC: 33.9 g/dL (ref 30.0–36.0)
MCV: 92.4 fl (ref 78.0–100.0)
MONO ABS: 0.5 10*3/uL (ref 0.1–1.0)
Monocytes Relative: 7.5 % (ref 3.0–12.0)
NEUTROS ABS: 4.8 10*3/uL (ref 1.4–7.7)
NEUTROS PCT: 70.9 % (ref 43.0–77.0)
Platelets: 202 10*3/uL (ref 150.0–400.0)
RBC: 4.38 Mil/uL (ref 3.87–5.11)
RDW: 13.7 % (ref 11.5–15.5)
WBC: 6.8 10*3/uL (ref 4.0–10.5)

## 2016-08-08 LAB — COMPREHENSIVE METABOLIC PANEL
ALT: 16 U/L (ref 0–35)
AST: 18 U/L (ref 0–37)
Albumin: 4 g/dL (ref 3.5–5.2)
Alkaline Phosphatase: 66 U/L (ref 39–117)
BILIRUBIN TOTAL: 0.6 mg/dL (ref 0.2–1.2)
BUN: 8 mg/dL (ref 6–23)
CHLORIDE: 99 meq/L (ref 96–112)
CO2: 31 meq/L (ref 19–32)
CREATININE: 0.59 mg/dL (ref 0.40–1.20)
Calcium: 9.1 mg/dL (ref 8.4–10.5)
GFR: 102.29 mL/min (ref >60.00)
GLUCOSE: 100 mg/dL — AB (ref 70–99)
Potassium: 4.3 mEq/L (ref 3.5–5.1)
SODIUM: 136 meq/L (ref 135–145)
Total Protein: 6.6 g/dL (ref 6.0–8.3)

## 2016-08-08 LAB — TSH: TSH: 0.7 u[IU]/mL (ref 0.35–4.50)

## 2016-08-08 NOTE — Progress Notes (Signed)
PCP notes:   Health maintenance:  No gaps identified.  Abnormal screenings:   Hearing - failed  Patient concerns:   None  Nurse concerns:  None  Next PCP appt:   08/20/16 @ 1000  I reviewed health advisor's note, was available for consultation, and agree with documentation and plan. Loura Pardon MD

## 2016-08-08 NOTE — Progress Notes (Signed)
Subjective:   Paula Jimenez is a 83 y.o. female who presents for an Initial Medicare Annual Wellness Visit.  Review of Systems    N/A  Cardiac Risk Factors include: advanced age (>69men, >76 women);dyslipidemia;hypertension     Objective:    Today's Vitals   08/08/16 0911 08/08/16 0912  BP: 130/70   Pulse: 69   Temp: 97.8 F (36.6 C)   TempSrc: Oral   SpO2: 95%   Weight: 120 lb 4 oz (54.5 kg)   Height: 5\' 4"  (1.626 m)   PainSc: 0-No pain 0-No pain   Body mass index is 20.64 kg/m.   Current Medications (verified) Outpatient Encounter Prescriptions as of 08/08/2016  Medication Sig  . Alum Hydroxide-Mag Carbonate (GAVISCON EXTRA STRENGTH) 160-105 MG CHEW Chew 1 tablet by mouth daily as needed (for heartburn).   . Ascorbic Acid (VITAMIN C WITH ROSE HIPS) 1000 MG tablet Take 1,000 mg by mouth daily.  [name] atenolol (TENORMIN) 50 MG tablet Take 1 tablet (50 mg total) by mouth daily.  Skipper Cliche SALINE NASAL NA Place 1 spray into the nose daily as needed (allergies).   . clotrimazole-betamethasone (LOTRISONE) cream Apply 1 application topically as needed.  [name] estradiol (ESTRACE) 0.1 MG/GM vaginal cream Vaginal Estrogen Cream apply a blueberry sized amount to vaginal opening using finger tip nightly for 2 weeks then use 3 times weekly before bed.  . fish oil-omega-3 fatty acids 1000 MG capsule Take 1 g by mouth daily.    . fluticasone (FLONASE) 50 MCG/ACT nasal spray Place 2 sprays into both nostrils daily.  [name] loperamide (IMODIUM) 2 MG capsule Take 2 mg by mouth as needed for diarrhea or loose stools (has been taking with Clindamycin).  [name] loratadine (CLARITIN) 10 MG tablet Take 10 mg by mouth as needed for allergies or rhinitis.   . Multiple Vitamins-Minerals (ONE-A-DAY WOMENS PETITES PO) Take 1 tablet by mouth every morning. Reported on 07/30/2015  . simvastatin (ZOCOR) 20 MG tablet Take 1 tablet (20 mg total) by mouth daily.  [name] VITAMIN D, CHOLECALCIFEROL, PO Take 1 tablet by mouth  daily. 5000 IU IN AM   No facility-administered encounter medications on file as of 08/08/2016.     Allergies (verified) Clindamycin/lincomycin; Codeine; Levofloxacin; Rofecoxib; Valtrex [valacyclovir hcl]; Adhesive [tape]; Ciprofloxacin; Nitrofurantoin; Penicillins; Raloxifene; and Tramadol   History: Past Medical History:  Diagnosis Date  . Allergy   . Asthma    years ago, 2-3 times a year pt experiences a problem with breathing.  . Bursitis, hip   . Family history of adverse reaction to anesthesia    DAUGHTER had a reaction to ansethesia  . Hyperlipidemia   . Hypertension   . HYPERTENSION, BENIGN 10/26/2006   Qualifier: Diagnosis of  By: Selinda Orion    . MITRAL VALVE PROLAPSE 02/25/2007   Qualifier: History of  By: Marcelino Scot CMA, [place] Bilberry    . Osteopenia 08/01  . Osteoporosis   . Skin cancer 2015   nose   Past Surgical History:  Procedure Laterality Date  . ABDOMINAL HYSTERECTOMY  1971  . BREAST BIOPSY Right 06/99   fibrocystic  . CYSTOSCOPY WITH BIOPSY N/A 05/31/2015   Procedure: CYSTOSCOPY WITH BIOPSY/ fulgeration;  Surgeon: Hollice Espy, MD;  Location: [facility] OR[facility];  Service: Urology;  Laterality: N/A;  . DENTAL SURGERY    . EYE SURGERY Bilateral 2011   June and August of 2011  . ORIF RADIAL FRACTURE Left 11/99   arm fracture, radial no surgery  . TONSILLECTOMY  Family History  Problem Relation Age of Onset  . Leukemia Sister   . Cancer Sister     leukemia  . Heart disease Mother   . Diabetes Mother   . Heart failure Mother   . Stroke Father   . Kidney disease Neg Hx    Social History   Occupational History  . retired    Social History Main Topics  . Smoking status: Never Smoker  . Smokeless tobacco: Never Used  . Alcohol use No  . Drug use: No  . Sexual activity: No    Tobacco Counseling Counseling given: No   Activities of Daily Living In your present state of health, do you have any difficulty performing the following activities: 08/08/2016   Hearing? Y  Vision? N  Difficulty concentrating or making decisions? Y  Walking or climbing stairs? N  Dressing or bathing? N  Doing errands, shopping? N  Preparing Food and eating ? N  Using the Toilet? N  In the past six months, have you accidently leaked urine? Y  Do you have problems with loss of bowel control? N  Managing your Medications? N  Managing your Finances? N  Housekeeping or managing your Housekeeping? N  Some recent data might be hidden    Immunizations and Health Maintenance Immunization History  Administered Date(s) Administered  . Influenza,inj,Quad PF,36+ Mos 06/22/2013, 06/26/2014, 04/25/2015, 04/21/2016  . Pneumococcal Conjugate-13 06/26/2014  . Pneumococcal Polysaccharide-23 11/19/2011  . Td 04/24/1998, 11/10/2008   There are no preventive care reminders to display for this patient.  Patient Care Team: Abner Greenspan, MD as PCP - General Oliver Barre, MD as Referring Physician (Ophthalmology)    Assessment:   This is a routine wellness examination for Paula Jimenez.  Hearing/Vision screen  Hearing Screening   125Hz  250Hz  500Hz  1000Hz  2000Hz  3000Hz  4000Hz  6000Hz  8000Hz   Right ear:   40 40 40  0    Left ear:   40 40 40  0    Vision Screening Comments: Last vision exam in Feb 2018 with Dr. Clide Dales  Dietary issues and exercise activities discussed: Current Exercise Habits: The patient does not participate in regular exercise at present, Exercise limited by: None identified  Goals    . Increase water intake          Starting 08/08/2016, I will continue to consume at least 64 oz of water daily.       Depression Screen PHQ 2/9 Scores 08/08/2016 08/07/2015 11/09/2012  PHQ - 2 Score 0 0 0    Fall Risk Fall Risk  08/08/2016 08/07/2015 11/09/2012  Falls in the past year? No No No  Risk for fall due to : - History of fall(s) -    Cognitive Function: MMSE - Mini Mental State Exam 08/08/2016 08/07/2015  Orientation to time 5 5  Orientation to Place 5 5    Registration 3 3  Attention/ Calculation 0 5  Recall 3 3  Language- name 2 objects 0 -  Language- repeat 1 1  Language- follow 3 step command 3 3  Language- read & follow direction 0 1  Write a sentence 0 -  Copy design 0 1  Total score 20 -     PLEASE NOTE: A Mini-Cog screen was completed. Maximum score is 20. A value of 0 denotes this part of Folstein MMSE was not completed or the patient failed this part of the Mini-Cog screening.   Mini-Cog Screening Orientation to Time - Max 5 pts Orientation to Place -  Max 5 pts Registration - Max 3 pts Recall - Max 3 pts Language Repeat - Max 1 pts Language Follow 3 Step Command - Max 3 pts     Screening Tests Health Maintenance  Topic Date Due  . MAMMOGRAM  07/25/2020 (Originally 11/25/2012)  . ZOSTAVAX  07/25/2020 (Originally 10/29/1988)  . TETANUS/TDAP  11/11/2018  . INFLUENZA VACCINE  Addressed  . DEXA SCAN  Completed  . PNA vac Low Risk Adult  Completed      Plan:     I have personally reviewed and addressed the Medicare Annual Wellness questionnaire and have noted the following in the patient's chart:  A. Medical and social history B. Use of alcohol, tobacco or illicit drugs  C. Current medications and supplements D. Functional ability and status E.  Nutritional status F.  Physical activity G. Advance directives H. List of other physicians I.  Hospitalizations, surgeries, and ER visits in previous 12 months J.  [place] to include hearing, vision, cognitive, depression L. Referrals and appointments - none  In addition, I have reviewed and discussed with patient certain preventive protocols, quality metrics, and best practice recommendations. A written personalized care plan for preventive services as well as general preventive health recommendations were provided to patient.  See attached scanned questionnaire for additional information.   Signed,   Lindell Noe, MHA, BS, LPN Health Coach

## 2016-08-08 NOTE — Patient Instructions (Signed)
[name] , Thank you for taking time to come for your Medicare Wellness Visit. I appreciate your ongoing commitment to your health goals. Please review the following plan we discussed and let me know if I can assist you in the future.   These are the goals we discussed: Goals    . Increase water intake          Starting 08/08/2016, I will continue to consume at least 64 oz of water daily.        This is a list of the screening recommended for you and due dates:  Health Maintenance  Topic Date Due  . Mammogram  07/25/2020*  . Shingles Vaccine  07/25/2020*  . Tetanus Vaccine  11/11/2018  . Flu Shot  Addressed  . DEXA scan (bone density measurement)  Completed  . Pneumonia vaccines  Completed  *Topic was postponed. The date shown is not the original due date.   Preventive Care for Adults  A healthy lifestyle and preventive care can promote health and wellness. Preventive health guidelines for adults include the following key practices.  . A routine yearly physical is a good way to check with your health care provider about your health and preventive screening. It is a chance to share any concerns and updates on your health and to receive a thorough exam.  . Visit your dentist for a routine exam and preventive care every 6 months. Brush your teeth twice a day and floss once a day. Good oral hygiene prevents tooth decay and gum disease.  . The frequency of eye exams is based on your age, health, family medical history, use  of contact lenses, and other factors. Follow your health care provider's ecommendations for frequency of eye exams.  . Eat a healthy diet. Foods like vegetables, fruits, whole grains, low-fat dairy products, and lean protein foods contain the nutrients you need without too many calories. Decrease your intake of foods high in solid fats, added sugars, and salt. Eat the right amount of calories for you. Get information about a proper diet from your health care  provider, if necessary.  . Regular physical exercise is one of the most important things you can do for your health. Most adults should get at least 150 minutes of moderate-intensity exercise (any activity that increases your heart rate and causes you to sweat) each week. In addition, most adults need muscle-strengthening exercises on 2 or more days a week.  Silver Sneakers may be a benefit available to you. To determine eligibility, you may visit the website: www.silversneakers.com or contact program at 281-481-5078 Mon-Fri between 8AM-8PM.   . Maintain a healthy weight. The body mass index (BMI) is a screening tool to identify possible weight problems. It provides an estimate of body fat based on height and weight. Your health care provider can find your BMI and can help you achieve or maintain a healthy weight.   For adults 20 years and older: ? A BMI below 18.5 is considered underweight. ? A BMI of 18.5 to 24.9 is normal. ? A BMI of 25 to 29.9 is considered overweight. ? A BMI of 30 and above is considered obese.   . Maintain normal blood lipids and cholesterol levels by exercising and minimizing your intake of saturated fat. Eat a balanced diet with plenty of fruit and vegetables. Blood tests for lipids and cholesterol should begin at age 99 and be repeated every 5 years. If your lipid or cholesterol levels are high, you are over  50, or you are at high risk for heart disease, you may need your cholesterol levels checked more frequently. Ongoing high lipid and cholesterol levels should be treated with medicines if diet and exercise are not working.  . If you smoke, find out from your health care provider how to quit. If you do not use tobacco, please do not start.  . If you choose to drink alcohol, please do not consume more than 2 drinks per day. One drink is considered to be 12 ounces (355 mL) of beer, 5 ounces (148 mL) of wine, or 1.5 ounces (44 mL) of liquor.  . If you are 46-79 years  old, ask your health care provider if you should take aspirin to prevent strokes.  . Use sunscreen. Apply sunscreen liberally and repeatedly throughout the day. You should seek shade when your shadow is shorter than you. Protect yourself by wearing long sleeves, pants, a wide-brimmed hat, and sunglasses year round, whenever you are outdoors.  . Once a month, do a whole body skin exam, using a mirror to look at the skin on your back. Tell your health care provider of new moles, moles that have irregular borders, moles that are larger than a pencil eraser, or moles that have changed in shape or color.

## 2016-08-08 NOTE — Progress Notes (Signed)
Pre visit review using our clinic review tool, if applicable. No additional management support is needed unless otherwise documented below in the visit note. 

## 2016-08-20 ENCOUNTER — Encounter: Payer: Self-pay | Admitting: Family Medicine

## 2016-08-20 ENCOUNTER — Ambulatory Visit (INDEPENDENT_AMBULATORY_CARE_PROVIDER_SITE_OTHER): Payer: Medicare HMO | Admitting: Family Medicine

## 2016-08-20 VITALS — BP 135/65 | HR 71 | Temp 98.2°F | Ht 64.0 in | Wt 122.0 lb

## 2016-08-20 DIAGNOSIS — I10 Essential (primary) hypertension: Secondary | ICD-10-CM

## 2016-08-20 DIAGNOSIS — Z85820 Personal history of malignant melanoma of skin: Secondary | ICD-10-CM

## 2016-08-20 DIAGNOSIS — E7800 Pure hypercholesterolemia, unspecified: Secondary | ICD-10-CM

## 2016-08-20 DIAGNOSIS — M810 Age-related osteoporosis without current pathological fracture: Secondary | ICD-10-CM

## 2016-08-20 DIAGNOSIS — Z0000 Encounter for general adult medical examination without abnormal findings: Secondary | ICD-10-CM

## 2016-08-20 MED ORDER — SIMVASTATIN 20 MG PO TABS: 20.0000 mg | ORAL_TABLET | Freq: Every day | ORAL | 11 refills | Status: DC

## 2016-08-20 MED ORDER — ZOSTER VACCINE LIVE 19400 UNT/0.65ML ~~LOC~~ SUSR: 0.6500 mL | Freq: Once | SUBCUTANEOUS | 0 refills | Status: AC

## 2016-08-20 MED ORDER — ATENOLOL 50 MG PO TABS: 50.0000 mg | ORAL_TABLET | Freq: Every day | ORAL | 11 refills | Status: DC

## 2016-08-20 NOTE — Assessment & Plan Note (Signed)
She will see derm in may for yearly exam  Many lentigines and sks

## 2016-08-20 NOTE — Assessment & Plan Note (Signed)
Disc goals for lipids and reasons to control them Rev labs with pt Rev low sat fat diet in detail Continue zocor and diet  

## 2016-08-20 NOTE — Assessment & Plan Note (Signed)
bp in fair control at this time  BP Readings from Last 1 Encounters:  08/20/16 135/65   No changes needed Disc lifstyle change with low sodium diet and exercise  Better on 2nd check

## 2016-08-20 NOTE — Assessment & Plan Note (Signed)
Has completed tx with actonel in the past  dexa 2013 No falls or fx Declines another dexa at this time Disc fall prev

## 2016-08-20 NOTE — Assessment & Plan Note (Signed)
Reviewed health habits including diet and exercise and skin cancer prevention Reviewed appropriate screening tests for age  Also reviewed health mt list, fam hx and immunization status , as well as social and family history   See HPI AMW reviewed Does not desire hearing aides Declines mammograms Will get zoster vaccine at [facility] today  Labs reviewed Encouraged good physical and mental activity

## 2016-08-20 NOTE — Progress Notes (Signed)
Subjective:    Patient ID: Paula Jimenez, female    DOB: [birth year], 83 y.o.   MRN: [id number]  HPI Here for health maintenance exam and to review chronic medical problems    Doing well Anxious to get outdoors as the weather improves /to work in the yard   Had amw visit this month Hearing- missed 4000 Hz both ears  Hearing does not bother her  She has ear wax problems intermittently    Wt Readings from Last 3 Encounters:  08/20/16 122 lb (55.3 kg)  08/08/16 120 lb 4 oz (54.5 kg)  05/05/16 123 lb 6.4 oz (56 kg)  stable  bmi 20.9  Mammogram neg 5/12- she declines further mammograms at her age  Self breast exam - no new lumps   Pap/gyn care  Nl pap 2003, has had a total hysterectomy  No gyn symptoms at all   Zoster vaccine - found out she can get it at the drugstore for 90 dollars  Will get that today at [facility])    Colonoscopy/ screening  : ifob neg 2013 , over age for screening now   dexa osteopenia 6/13 Finished 5 y of actonel in the past  No falls  No fractures  She has lost 1 inch  She declines another dexa at this time    bp is up on first check today/ she was a little stressed  No cp or palpitations or headaches or edema  No side effects to medicines  BP Readings from Last 3 Encounters:  08/20/16 (!) 144/68  08/08/16 130/70  05/05/16 140/72    Better on 2nd check after sitting BP: 135/65     Hx of hyperlipidemia Lab Results  Component Value Date   CHOL 167 08/08/2016   CHOL 160 10/23/2015   CHOL 152 10/18/2014   Lab Results  Component Value Date   HDL 54.00 08/08/2016   HDL 55.60 10/23/2015   HDL 52.40 10/18/2014   Lab Results  Component Value Date   LDLCALC 93 08/08/2016   LDLCALC 78 10/23/2015   LDLCALC 71 10/18/2014   Lab Results  Component Value Date   TRIG 101.0 08/08/2016   TRIG 131.0 10/23/2015   TRIG 143.0 10/18/2014   Lab Results  Component Value Date   CHOLHDL 3 08/08/2016   CHOLHDL 3 10/23/2015   CHOLHDL 3  10/18/2014   Lab Results  Component Value Date   LDLDIRECT 159.5 02/19/2009   LDLDIRECT 158.2 11/10/2008   LDLDIRECT 138.6 10/26/2006    zocor and diet  Still at goal   Results for orders placed or performed in visit on 08/08/16  CBC with Differential/Platelet  Result Value Ref Range   WBC 6.8 4.0 - 10.5 K/uL   RBC 4.38 3.87 - 5.11 Mil/uL   Hemoglobin 13.7 12.0 - 15.0 g/dL   HCT 40.5 36.0 - 46.0 %   MCV 92.4 78.0 - 100.0 fl   MCHC 33.9 30.0 - 36.0 g/dL   RDW 13.7 11.5 - 15.5 %   Platelets 202.0 150.0 - 400.0 K/uL   Neutrophils Relative % 70.9 43.0 - 77.0 %   Lymphocytes Relative 20.7 12.0 - 46.0 %   Monocytes Relative 7.5 3.0 - 12.0 %   Eosinophils Relative 0.6 0.0 - 5.0 %   Basophils Relative 0.3 0.0 - 3.0 %   Neutro Abs 4.8 1.4 - 7.7 K/uL   Lymphs Abs 1.4 0.7 - 4.0 K/uL   Monocytes Absolute 0.5 0.1 - 1.0 K/uL   Eosinophils Absolute  0.0 0.0 - 0.7 K/uL   Basophils Absolute 0.0 0.0 - 0.1 K/uL  Comprehensive metabolic panel  Result Value Ref Range   Sodium 136 135 - 145 mEq/L   Potassium 4.3 3.5 - 5.1 mEq/L   Chloride 99 96 - 112 mEq/L   CO2 31 19 - 32 mEq/L   Glucose, Bld 100 (H) 70 - 99 mg/dL   BUN 8 6 - 23 mg/dL   Creatinine, Ser 0.59 0.40 - 1.20 mg/dL   Total Bilirubin 0.6 0.2 - 1.2 mg/dL   Alkaline Phosphatase 66 39 - 117 U/L   AST 18 0 - 37 U/L   ALT 16 0 - 35 U/L   Total Protein 6.6 6.0 - 8.3 g/dL   Albumin 4.0 3.5 - 5.2 g/dL   Calcium 9.1 8.4 - 10.5 mg/dL   GFR 102.29 >60.00 mL/min  TSH  Result Value Ref Range   TSH 0.70 0.35 - 4.50 uIU/mL  Lipid panel  Result Value Ref Range   Cholesterol 167 0 - 200 mg/dL   Triglycerides 101.0 0.0 - 149.0 mg/dL   HDL 54.00 >39.00 mg/dL   VLDL 20.2 0.0 - 40.0 mg/dL   LDL Cholesterol 93 0 - 99 mg/dL   Total CHOL/HDL Ratio 3    NonHDL 113.23     Patient Active Problem List   Diagnosis Date Noted  . Routine general medical examination at a health care facility 08/07/2016  . SOB (shortness of breath) on exertion  05/01/2015  . Pre-operative cardiovascular examination 05/01/2015  . Frequent UTI 02/16/2015  . Epistaxis, recurrent 02/28/2014  . History of shingles 02/20/2014  . Hypoglycemia 06/22/2013  . HEARING LOSS, BILATERAL 09/23/2007  . History of melanoma 02/25/2007  . Hyperlipidemia 02/25/2007  . MITRAL VALVE PROLAPSE 02/25/2007  . ALLERGIC RHINITIS 02/25/2007  . DEGENERATIVE DISC DISEASE, LUMBOSACRAL SPINE 02/25/2007  . Osteoporosis 02/25/2007  . HYPERTENSION, BENIGN 10/26/2006   Past Medical History:  Diagnosis Date  . Allergy   . Asthma    years ago, 2-3 times a year pt experiences a problem with breathing.  . Bursitis, hip   . Family history of adverse reaction to anesthesia    DAUGHTER had a reaction to ansethesia  . Hyperlipidemia   . Hypertension   . HYPERTENSION, BENIGN 10/26/2006   Qualifier: Diagnosis of  By: Selinda Orion    . MITRAL VALVE PROLAPSE 02/25/2007   Qualifier: History of  By: Marcelino Scot CMA, [place] Bilberry    . Osteopenia 08/01  . Osteoporosis   . Skin cancer 2015   nose   Past Surgical History:  Procedure Laterality Date  . ABDOMINAL HYSTERECTOMY  1971  . BREAST BIOPSY Right 06/99   fibrocystic  . CYSTOSCOPY WITH BIOPSY N/A 05/31/2015   Procedure: CYSTOSCOPY WITH BIOPSY/ fulgeration;  Surgeon: Hollice Espy, MD;  Location: [facility] ORS;  Service: Urology;  Laterality: N/A;  . DENTAL SURGERY    . EYE SURGERY Bilateral 2011   June and August of 2011  . ORIF RADIAL FRACTURE Left 11/99   arm fracture, radial no surgery  . TONSILLECTOMY     Social History  Substance Use Topics  . Smoking status: Never Smoker  . Smokeless tobacco: Never Used  . Alcohol use No   Family History  Problem Relation Age of Onset  . Leukemia Sister   . Cancer Sister     leukemia  . Heart disease Mother   . Diabetes Mother   . Heart failure Mother   . Stroke Father   .  Kidney disease Neg Hx    Allergies  Allergen Reactions  . Clindamycin/Lincomycin Swelling    Throat  swelling  . Codeine     Unknown per pt   . Levofloxacin     Unknown per pt   . Rofecoxib     Unknown per pt   . Valtrex [Valacyclovir Hcl]     Unknown per pt   . Adhesive [Tape] Rash  . Ciprofloxacin Diarrhea  . Nitrofurantoin Nausea Only  . Penicillins Swelling    mouth swelling  . Raloxifene Rash  . Tramadol     dizzy   Current Outpatient Prescriptions on File Prior to Visit  Medication Sig Dispense Refill  . Alum Hydroxide-Mag Carbonate (GAVISCON EXTRA STRENGTH) 160-105 MG CHEW Chew 1 tablet by mouth daily as needed (for heartburn).     . Ascorbic Acid (VITAMIN C WITH ROSE HIPS) 1000 MG tablet Take 1,000 mg by mouth daily.    Skipper Cliche SALINE NASAL NA Place 1 spray into the nose daily as needed (allergies).     . clotrimazole-betamethasone (LOTRISONE) cream Apply 1 application topically as needed.    [name] estradiol (ESTRACE) 0.1 MG/GM vaginal cream Vaginal Estrogen Cream apply a blueberry sized amount to vaginal opening using finger tip nightly for 2 weeks then use 3 times weekly before bed. 42.5 g 6  . fluticasone (FLONASE) 50 MCG/ACT nasal spray Place 2 sprays into both nostrils daily. 16 g 6  . loperamide (IMODIUM) 2 MG capsule Take 2 mg by mouth as needed for diarrhea or loose stools (has been taking with Clindamycin).    [name] loratadine (CLARITIN) 10 MG tablet Take 10 mg by mouth as needed for allergies or rhinitis.     [name] VITAMIN D, CHOLECALCIFEROL, PO Take 1 tablet by mouth daily. 5000 IU IN AM     No current facility-administered medications on file prior to visit.     Review of Systems    Review of Systems  Constitutional: Negative for fever, appetite change, fatigue and unexpected weight change.  Eyes: Negative for pain and visual disturbance.  Respiratory: Negative for cough and shortness of breath.   Cardiovascular: Negative for cp or palpitations    Gastrointestinal: Negative for nausea, diarrhea and constipation.  Genitourinary: Negative for urgency and frequency.    Skin: Negative for pallor or rash   Neurological: Negative for weakness, light-headedness, numbness and headaches.  Hematological: Negative for adenopathy. Does not bruise/bleed easily.  Psychiatric/Behavioral: Negative for dysphoric mood. The patient is not nervous/anxious.      Objective:   Physical Exam  Constitutional: She appears well-developed and well-nourished. No distress.  Slim and well appearing elderly female   HENT:  Head: Normocephalic and atraumatic.  Right Ear: External ear normal.  Left Ear: External ear normal.  Mouth/Throat: Oropharynx is clear and moist.  Eyes: Conjunctivae and EOM are normal. Pupils are equal, round, and reactive to light. No scleral icterus.  Neck: Normal range of motion. Neck supple. No JVD present. Carotid bruit is not present. No thyromegaly present.  Cardiovascular: Normal rate, regular rhythm, normal heart sounds and intact distal pulses.  Exam reveals no gallop.   Pulmonary/Chest: Effort normal and breath sounds normal. No respiratory distress. She has no wheezes. She exhibits no tenderness.  Abdominal: Soft. Bowel sounds are normal. She exhibits no distension, no abdominal bruit and no mass. There is no tenderness.  Genitourinary: No breast swelling, tenderness, discharge or bleeding.  Genitourinary Comments: Breast exam: No mass, nodules, thickening, tenderness, bulging, retraction, inflamation,  nipple discharge or skin changes noted.  No axillary or clavicular LA.      Musculoskeletal: Normal range of motion. She exhibits no edema or tenderness.  Lymphadenopathy:    She has no cervical adenopathy.  Neurological: She is alert. She has normal reflexes. No cranial nerve deficit. She exhibits normal muscle tone. Coordination normal.  Skin: Skin is warm and dry. No rash noted. No erythema. No pallor.  Lentigines and SKs diffusely   Psychiatric: She has a normal mood and affect.          Assessment & Plan:   Problem List Items Addressed  This Visit      Cardiovascular and Mediastinum   HYPERTENSION, BENIGN - Primary    bp in fair control at this time  BP Readings from Last 1 Encounters:  08/20/16 135/65   No changes needed Disc lifstyle change with low sodium diet and exercise  Better on 2nd check       Relevant Medications   atenolol (TENORMIN) 50 MG tablet   simvastatin (ZOCOR) 20 MG tablet     Musculoskeletal and Integument   Osteoporosis    Has completed tx with actonel in the past  dexa 2013 No falls or fx Declines another dexa at this time Disc fall prev        Other   History of melanoma    She will see derm in may for yearly exam  Many lentigines and sks      Hyperlipidemia    Disc goals for lipids and reasons to control them Rev labs with pt Rev low sat fat diet in detail Continue zocor and diet       Relevant Medications   atenolol (TENORMIN) 50 MG tablet   simvastatin (ZOCOR) 20 MG tablet   Routine general medical examination at a health care facility    Reviewed health habits including diet and exercise and skin cancer prevention Reviewed appropriate screening tests for age  Also reviewed health mt list, fam hx and immunization status , as well as social and family history   See HPI AMW reviewed Does not desire hearing aides Declines mammograms Will get zoster vaccine at [facility] today  Labs reviewed Encouraged good physical and mental activity

## 2016-08-20 NOTE — Progress Notes (Signed)
Pre visit review using our clinic review tool, if applicable. No additional management support is needed unless otherwise documented below in the visit note. 

## 2016-08-20 NOTE — Patient Instructions (Addendum)
Take at least 2000 iu daily of vit D 3 daily for bone healthy  Take a multi vitamin daily if you are a picky/resticted eater  Get your shingles vaccine today at [facility] taking good care of yourself  Stay active and outdoors

## 2016-08-28 DIAGNOSIS — Z961 Presence of intraocular lens: Secondary | ICD-10-CM | POA: Diagnosis not present

## 2016-08-28 DIAGNOSIS — H16223 Keratoconjunctivitis sicca, not specified as Sjogren's, bilateral: Secondary | ICD-10-CM | POA: Diagnosis not present

## 2016-08-28 DIAGNOSIS — H26492 Other secondary cataract, left eye: Secondary | ICD-10-CM | POA: Diagnosis not present

## 2016-10-08 ENCOUNTER — Ambulatory Visit (INDEPENDENT_AMBULATORY_CARE_PROVIDER_SITE_OTHER): Payer: Self-pay | Admitting: Orthopaedic Surgery

## 2016-10-08 ENCOUNTER — Ambulatory Visit (INDEPENDENT_AMBULATORY_CARE_PROVIDER_SITE_OTHER): Payer: Medicare HMO

## 2016-10-08 ENCOUNTER — Ambulatory Visit (INDEPENDENT_AMBULATORY_CARE_PROVIDER_SITE_OTHER): Payer: Medicare HMO | Admitting: Orthopaedic Surgery

## 2016-10-08 DIAGNOSIS — M7062 Trochanteric bursitis, left hip: Secondary | ICD-10-CM | POA: Insufficient documentation

## 2016-10-08 DIAGNOSIS — M25552 Pain in left hip: Secondary | ICD-10-CM

## 2016-10-08 NOTE — Progress Notes (Signed)
Office Visit Note   Patient: Paula Jimenez           Date of Birth: 02/26/1927           MRN: [id number] Visit Date: 10/08/2016              Requested by: Abner Greenspan, MD [place], [place] [address] PCP: Loura Pardon, MD   Assessment & Plan: Visit Diagnoses:  1. Pain in left hip   2. Trochanteric bursitis, left hip     Plan: I feel that this is definitely trochanteric bursitis. I showed her stretching exercises and had her demonstrate these back to me. I did offer a steroid injection but she declined this because she says is not hurting bad enough yet. She'll try not to sleep on that side at night and can alternate anti-inflammatories and ice and heat as well as the cage no over-the-counter topical anti-inflammatory. If he gets bad enough we can always try physical therapy and an injection.  Follow-Up Instructions: Return if symptoms worsen or fail to improve.   Orders:  Orders Placed This Encounter  Procedures  . XR HIP UNILAT W OR W/O PELVIS 1V LEFT   No orders of the defined types were placed in this encounter.     Procedures: No procedures performed   Clinical Data: No additional findings.   Subjective: No chief complaint on file. The patient is someone I've seen before. She is listed as a new patient but is been many years since of seeing her for the same thing. She has left hip pain. It only hurts when she lays on that side. Flared up over the: Or months for last 2 months. She denies any groin pain. She denies any injury. She denies a numbness and tingling or back pain. It's a mild pain that's irritating to her more so. She's been working in the yard quite a bit. She is 83 years old. She denies any recent illnesses either.  HPI  Review of Systems She denies any chest pain, headache, shortness of breath, fever, chills, nausea, vomiting.  Objective: Vital Signs: There were no vitals taken for this visit.  Physical Exam She is alert and  oriented 3 and does not walk with a limp. She is not using an assistive device. Ortho Exam Examination of her left hip shows fluid active and passive range of motion with no pain in the groin at all. She has just some mild pain to palpation of the trochanteric area only. Specialty Comments:  No specialty comments available.  Imaging: No results found.   PMFS History: Patient Active Problem List   Diagnosis Date Noted  . Trochanteric bursitis, left hip 10/08/2016  . Routine general medical examination at a health care facility 08/07/2016  . SOB (shortness of breath) on exertion 05/01/2015  . Pre-operative cardiovascular examination 05/01/2015  . Frequent UTI 02/16/2015  . Epistaxis, recurrent 02/28/2014  . History of shingles 02/20/2014  . Hypoglycemia 06/22/2013  . HEARING LOSS, BILATERAL 09/23/2007  . History of melanoma 02/25/2007  . Hyperlipidemia 02/25/2007  . MITRAL VALVE PROLAPSE 02/25/2007  . ALLERGIC RHINITIS 02/25/2007  . DEGENERATIVE DISC DISEASE, LUMBOSACRAL SPINE 02/25/2007  . Osteoporosis 02/25/2007  . HYPERTENSION, BENIGN 10/26/2006   Past Medical History:  Diagnosis Date  . Allergy   . Asthma    years ago, 2-3 times a year pt experiences a problem with breathing.  . Bursitis, hip   . Family history of adverse reaction to  anesthesia    DAUGHTER had a reaction to ansethesia  . Hyperlipidemia   . Hypertension   . HYPERTENSION, BENIGN 10/26/2006   Qualifier: Diagnosis of  By: Selinda Orion    . MITRAL VALVE PROLAPSE 02/25/2007   Qualifier: History of  By: Marcelino Scot CMA, [place] Bilberry    . Osteopenia 08/01  . Osteoporosis   . Skin cancer 2015   nose    Family History  Problem Relation Age of Onset  . Leukemia Sister   . Cancer Sister     leukemia  . Heart disease Mother   . Diabetes Mother   . Heart failure Mother   . Stroke Father   . Kidney disease Neg Hx     Past Surgical History:  Procedure Laterality Date  . ABDOMINAL HYSTERECTOMY  1971  . BREAST  BIOPSY Right 06/99   fibrocystic  . CYSTOSCOPY WITH BIOPSY N/A 05/31/2015   Procedure: CYSTOSCOPY WITH BIOPSY/ fulgeration;  Surgeon: Hollice Espy, MD;  Location: [facility] ORS;  Service: Urology;  Laterality: N/A;  . DENTAL SURGERY    . EYE SURGERY Bilateral 2011   June and August of 2011  . ORIF RADIAL FRACTURE Left 11/99   arm fracture, radial no surgery  . TONSILLECTOMY     Social History   Occupational History  . retired    Social History Main Topics  . Smoking status: Never Smoker  . Smokeless tobacco: Never Used  . Alcohol use No  . Drug use: No  . Sexual activity: No

## 2016-11-21 DIAGNOSIS — L814 Other melanin hyperpigmentation: Secondary | ICD-10-CM | POA: Diagnosis not present

## 2016-11-21 DIAGNOSIS — L820 Inflamed seborrheic keratosis: Secondary | ICD-10-CM | POA: Diagnosis not present

## 2016-11-21 DIAGNOSIS — D1801 Hemangioma of skin and subcutaneous tissue: Secondary | ICD-10-CM | POA: Diagnosis not present

## 2016-11-21 DIAGNOSIS — L821 Other seborrheic keratosis: Secondary | ICD-10-CM | POA: Diagnosis not present

## 2016-11-21 DIAGNOSIS — L400 Psoriasis vulgaris: Secondary | ICD-10-CM | POA: Diagnosis not present

## 2016-11-21 DIAGNOSIS — Z85828 Personal history of other malignant neoplasm of skin: Secondary | ICD-10-CM | POA: Diagnosis not present

## 2016-11-21 DIAGNOSIS — L720 Epidermal cyst: Secondary | ICD-10-CM | POA: Diagnosis not present

## 2016-11-21 DIAGNOSIS — D692 Other nonthrombocytopenic purpura: Secondary | ICD-10-CM | POA: Diagnosis not present

## 2017-01-05 ENCOUNTER — Encounter: Payer: Self-pay | Admitting: Family Medicine

## 2017-01-05 ENCOUNTER — Ambulatory Visit (INDEPENDENT_AMBULATORY_CARE_PROVIDER_SITE_OTHER): Payer: Medicare HMO | Admitting: Family Medicine

## 2017-01-05 VITALS — BP 128/70 | HR 74 | Temp 98.1°F | Wt 120.0 lb

## 2017-01-05 DIAGNOSIS — R8290 Unspecified abnormal findings in urine: Secondary | ICD-10-CM

## 2017-01-05 DIAGNOSIS — R300 Dysuria: Secondary | ICD-10-CM

## 2017-01-05 DIAGNOSIS — R319 Hematuria, unspecified: Secondary | ICD-10-CM | POA: Diagnosis not present

## 2017-01-05 LAB — POC URINALSYSI DIPSTICK (AUTOMATED)
BILIRUBIN UA: NEGATIVE
GLUCOSE UA: NEGATIVE
KETONES UA: NEGATIVE
Nitrite, UA: NEGATIVE
Protein, UA: NEGATIVE
SPEC GRAV UA: 1.02 (ref 1.010–1.025)
Urobilinogen, UA: 0.2 E.U./dL
pH, UA: 6 (ref 5.0–8.0)

## 2017-01-05 MED ORDER — SULFAMETHOXAZOLE-TRIMETHOPRIM 800-160 MG PO TABS: 1.0000 | ORAL_TABLET | Freq: Two times a day (BID) | ORAL | 0 refills | Status: DC

## 2017-01-05 NOTE — Patient Instructions (Signed)

## 2017-01-05 NOTE — Addendum Note (Signed)
Addended by: Loraine Grip on: 01/05/2017 10:51 AM   Modules accepted: Orders

## 2017-01-05 NOTE — Progress Notes (Signed)
SUBJECTIVE: Paula Jimenez is a 83 y.o. female who complains of urinary frequency, urgency, hematuria and dysuria x 3 days, without flank pain, fever, chills, or abnormal vaginal discharge or bleeding.   Current Outpatient Prescriptions on File Prior to Visit  Medication Sig Dispense Refill  . Alum Hydroxide-Mag Carbonate (GAVISCON EXTRA STRENGTH) 160-105 MG CHEW Chew 1 tablet by mouth daily as needed (for heartburn).     . Ascorbic Acid (VITAMIN C WITH ROSE HIPS) 1000 MG tablet Take 1,000 mg by mouth daily.    [name] atenolol (TENORMIN) 50 MG tablet Take 1 tablet (50 mg total) by mouth daily. 30 tablet 11  . AYR SALINE NASAL NA Place 1 spray into the nose daily as needed (allergies).     . clotrimazole-betamethasone (LOTRISONE) cream Apply 1 application topically as needed.    [name] estradiol (ESTRACE) 0.1 MG/GM vaginal cream Vaginal Estrogen Cream apply a blueberry sized amount to vaginal opening using finger tip nightly for 2 weeks then use 3 times weekly before bed. 42.5 g 6  . fluorometholone (FML) 0.1 % ophthalmic suspension Place 1 drop into both eyes 2 (two) times daily.    . fluticasone (FLONASE) 50 MCG/ACT nasal spray Place 2 sprays into both nostrils daily. 16 g 6  . loperamide (IMODIUM) 2 MG capsule Take 2 mg by mouth as needed for diarrhea or loose stools (has been taking with Clindamycin).    [name] loratadine (CLARITIN) 10 MG tablet Take 10 mg by mouth as needed for allergies or rhinitis.     [name] simvastatin (ZOCOR) 20 MG tablet Take 1 tablet (20 mg total) by mouth daily. 30 tablet 11  . VITAMIN D, CHOLECALCIFEROL, PO Take 1 tablet by mouth daily. 5000 IU IN AM     No current facility-administered medications on file prior to visit.     Allergies  Allergen Reactions  . Clindamycin/Lincomycin Swelling    Throat swelling  . Codeine     Unknown per pt   . Levofloxacin     Unknown per pt   . Rofecoxib     Unknown per pt   . Valtrex [Valacyclovir Hcl]     Unknown per pt   . Adhesive  [Tape] Rash  . Ciprofloxacin Diarrhea  . Nitrofurantoin Nausea Only  . Penicillins Swelling    mouth swelling  . Raloxifene Rash  . Tramadol     dizzy    Past Medical History:  Diagnosis Date  . Allergy   . Asthma    years ago, 2-3 times a year pt experiences a problem with breathing.  . Bursitis, hip   . Family history of adverse reaction to anesthesia    DAUGHTER had a reaction to ansethesia  . Hyperlipidemia   . Hypertension   . HYPERTENSION, BENIGN 10/26/2006   Qualifier: Diagnosis of  By: Selinda Orion    . MITRAL VALVE PROLAPSE 02/25/2007   Qualifier: History of  By: Marcelino Scot CMA, [place] Bilberry    . Osteopenia 08/01  . Osteoporosis   . Skin cancer 2015   nose    Past Surgical History:  Procedure Laterality Date  . ABDOMINAL HYSTERECTOMY  1971  . BREAST BIOPSY Right 06/99   fibrocystic  . CYSTOSCOPY WITH BIOPSY N/A 05/31/2015   Procedure: CYSTOSCOPY WITH BIOPSY/ fulgeration;  Surgeon: Hollice Espy, MD;  Location: [facility] ORS;  Service: Urology;  Laterality: N/A;  . DENTAL SURGERY    . EYE SURGERY Bilateral 2011   June and August of 2011  . ORIF RADIAL  FRACTURE Left 11/99   arm fracture, radial no surgery  . TONSILLECTOMY      Family History  Problem Relation Age of Onset  . Leukemia Sister   . Cancer Sister        leukemia  . Heart disease Mother   . Diabetes Mother   . Heart failure Mother   . Stroke Father   . Kidney disease Neg Hx     Social History   Social History  . Marital status: Widowed    Spouse name: N/A  . Number of children: 4  . Years of education: N/A   Occupational History  . retired    Social History Main Topics  . Smoking status: Never Smoker  . Smokeless tobacco: Never Used  . Alcohol use No  . Drug use: No  . Sexual activity: No   Other Topics Concern  . Not on file   Social History Narrative   caring for daughter with leg injury   caring for sister with leukemia          The PMH, PSH, Social History, Family History,  Medications, and allergies have been reviewed in [facility], and have been updated if relevant.  OBJECTIVE:  BP 128/70   Pulse 74   Temp 98.1 F (36.7 C)   Wt 120 lb (54.4 kg)   SpO2 98%   BMI 20.60 kg/m   Appears well, in no apparent distress.  Vital signs are normal. The abdomen is soft without tenderness, guarding, mass, rebound or organomegaly. No CVA tenderness or inguinal adenopathy noted. Urine dipstick shows positive for WBC's and positive for RBC's.     ASSESSMENT: UTI uncomplicated without evidence of pyelonephritis  PLAN: Treatment per orders - multiple drug allergies- can take Bactrim- 1 tablet twice daily x 5 days, send urine for cx, also push fluids, may use Pyridium OTC prn. Call or return to clinic prn if these symptoms worsen or fail to improve as anticipated.

## 2017-01-05 NOTE — Progress Notes (Signed)
Pre visit review using our clinic review tool, if applicable. No additional management support is needed unless otherwise documented below in the visit note. 

## 2017-01-07 LAB — URINE CULTURE

## 2017-01-27 ENCOUNTER — Ambulatory Visit: Payer: Medicare HMO

## 2017-01-27 ENCOUNTER — Ambulatory Visit
Admission: RE | Admit: 2017-01-27 | Discharge: 2017-01-27 | Disposition: A | Discharge status: Home / Self Care | Payer: Medicare HMO | Source: Ambulatory Visit | Attending: Urology | Admitting: Urology

## 2017-01-27 DIAGNOSIS — D3001 Benign neoplasm of right kidney: Secondary | ICD-10-CM | POA: Diagnosis not present

## 2017-01-27 DIAGNOSIS — D1771 Benign lipomatous neoplasm of kidney: Secondary | ICD-10-CM | POA: Diagnosis not present

## 2017-01-30 ENCOUNTER — Telehealth: Payer: Self-pay | Admitting: *Deleted

## 2017-01-30 NOTE — Telephone Encounter (Signed)
Pt has appt 02/02/17 at 2pm with Dr Glori Bickers.

## 2017-01-30 NOTE — Telephone Encounter (Signed)
I will see her then  

## 2017-01-30 NOTE — Telephone Encounter (Signed)
Patient Name: Paula Jimenez DOB: 05-08-1927 Initial Comment Caller states has had diarrhea off/on for a while but color has changed-is now green; no longer diarrhea; Nurse Assessment Nurse: Yolanda Bonine, RN, Erin Date/Time (Eastern Time): 01/30/2017 11:43:29 AM Confirm and document reason for call. If symptomatic, describe symptoms. ---Caller states has had diarrhea off/on for a while but now green and was black 1 time. Does the patient have any new or worsening symptoms? ---Yes Will a triage be completed? ---Yes Related visit to physician within the last 2 weeks? ---No Does the PT have any chronic conditions? (i.e. diabetes, asthma, etc.) ---Yes List chronic conditions. ---HTN Is this a behavioral health or substance abuse call? ---No Guidelines Guideline Title Affirmed Question Affirmed Notes Stools - Unusual Color [1] Abnormal color is unexplained AND [2] persists > 24 hours Final Disposition User See PCP When Office is Open (within 3 days) Yolanda Bonine, RN, Junie Panning Comments pt had UTI the last of July. Going to Urology next week for check up Stool was black color on Wednesday to light green this morning Pt did not want to be seen in another office, requesting to be seen at [facility]. Encouraged her to call the office today to make appt for monday Referrals REFERRED TO PCP OFFICE Disagree/Comply: Comply

## 2017-01-30 NOTE — Telephone Encounter (Signed)
Spoke to pt who states she has been experiencing diarrhea since Wed 8/8. She denies any lifestyle changes, and has not taken anything OTC. She is not having any discomfort and is wanting to know if it is ok for her to wait until Monday to be seen. pls advise

## 2017-02-02 ENCOUNTER — Ambulatory Visit (INDEPENDENT_AMBULATORY_CARE_PROVIDER_SITE_OTHER): Payer: Medicare HMO | Admitting: Family Medicine

## 2017-02-02 ENCOUNTER — Encounter: Payer: Self-pay | Admitting: Family Medicine

## 2017-02-02 VITALS — BP 140/72 | HR 70 | Temp 98.0°F | Ht 64.0 in | Wt 120.5 lb

## 2017-02-02 DIAGNOSIS — R195 Other fecal abnormalities: Secondary | ICD-10-CM

## 2017-02-02 NOTE — Assessment & Plan Note (Addendum)
Suspect abx for uti last mo may have changed bacterial flora  Stool soft but no abd pain or diarrhea  Heme neg stool and nl rectal exam today  Will watch for abd pain or other symptoms Pt will also check ingredients on her new mvi

## 2017-02-02 NOTE — Progress Notes (Signed)
Subjective:    Patient ID: Paula Jimenez, female    DOB: December 15, 1926, 83 y.o.   MRN: [id number]  HPI Here for diarrhea with color change of stool   Wt Readings from Last 3 Encounters:  02/02/17 120 lb 8 oz (54.7 kg)  01/05/17 120 lb (54.4 kg)  08/20/16 122 lb (55.3 kg)   BP Readings from Last 3 Encounters:  02/02/17 140/72  01/05/17 128/70  08/20/16 135/65    She was recently tx for uti 7/16 with 5 d of bactrim  E coli  That worked  No diarrhea then   Unsure if this is related   A week ago she ate some steak sub from [place] [employer] (after it was out of refrigeration) The next day she had a dark stool (green to black)  Stool is not loose or watery / just soft  No abd pain or cramping  Hemorrhoid itches and burns occasionally  She has not had any pepto bismol No other diarrhea medicines  Eats raisin bran daily She did start a mvi   (unsure if there is iron in it) -it [place] a new brand   Did eat fried chicken Friday and then chick filet and then a hamburger  More fat than she is used to   Having 1-2 bm daily    Patient Active Problem List   Diagnosis Date Noted  . Abnormal stool color 02/02/2017  . Trochanteric bursitis, left hip 10/08/2016  . Routine general medical examination at a health care facility 08/07/2016  . SOB (shortness of breath) on exertion 05/01/2015  . Pre-operative cardiovascular examination 05/01/2015  . Frequent UTI 02/16/2015  . Epistaxis, recurrent 02/28/2014  . History of shingles 02/20/2014  . Hypoglycemia 06/22/2013  . HEARING LOSS, BILATERAL 09/23/2007  . History of melanoma 02/25/2007  . Hyperlipidemia 02/25/2007  . MITRAL VALVE PROLAPSE 02/25/2007  . ALLERGIC RHINITIS 02/25/2007  . DEGENERATIVE DISC DISEASE, LUMBOSACRAL SPINE 02/25/2007  . Osteoporosis 02/25/2007  . HYPERTENSION, BENIGN 10/26/2006   Past Medical History:  Diagnosis Date  . Allergy   . Asthma    years ago, 2-3 times a year pt experiences a problem with breathing.   . Bursitis, hip   . Family history of adverse reaction to anesthesia    DAUGHTER had a reaction to ansethesia  . Hyperlipidemia   . Hypertension   . HYPERTENSION, BENIGN 10/26/2006   Qualifier: Diagnosis of  By: Selinda Orion    . MITRAL VALVE PROLAPSE 02/25/2007   Qualifier: History of  By: Marcelino Scot CMA, [place]    . Osteopenia 08/01  . Osteoporosis   . Skin cancer 2015   nose   Past Surgical History:  Procedure Laterality Date  . ABDOMINAL HYSTERECTOMY  1971  . BREAST BIOPSY Right 06/99   fibrocystic  . CYSTOSCOPY WITH BIOPSY N/A 05/31/2015   Procedure: CYSTOSCOPY WITH BIOPSY/ fulgeration;  Surgeon: Hollice Espy, MD;  Location: [facility] ORS;  Service: Urology;  Laterality: N/A;  . DENTAL SURGERY    . EYE SURGERY Bilateral 2011   June and August of 2011  . ORIF RADIAL FRACTURE Left 11/99   arm fracture, radial no surgery  . TONSILLECTOMY     Social History  Substance Use Topics  . Smoking status: Never Smoker  . Smokeless tobacco: Never Used  . Alcohol use No   Family History  Problem Relation Age of Onset  . Leukemia Sister   . Cancer Sister        leukemia  .  Heart disease Mother   . Diabetes Mother   . Heart failure Mother   . Stroke Father   . Kidney disease Neg Hx    Allergies  Allergen Reactions  . Clindamycin/Lincomycin Swelling    Throat swelling  . Codeine     Unknown per pt   . Levofloxacin     Unknown per pt   . Rofecoxib     Unknown per pt   . Valtrex [Valacyclovir Hcl]     Unknown per pt   . Adhesive [Tape] Rash  . Ciprofloxacin Diarrhea  . Nitrofurantoin Nausea Only  . Penicillins Swelling    mouth swelling  . Raloxifene Rash  . Tramadol     dizzy   Current Outpatient Prescriptions on File Prior to Visit  Medication Sig Dispense Refill  . Alum Hydroxide-Mag Carbonate (GAVISCON EXTRA STRENGTH) 160-105 MG CHEW Chew 1 tablet by mouth daily as needed (for heartburn).     [name] atenolol (TENORMIN) 50 MG tablet Take 1 tablet (50 mg total) by  mouth daily. 30 tablet 11  . AYR SALINE NASAL NA Place 1 spray into the nose daily as needed (allergies).     . clotrimazole-betamethasone (LOTRISONE) cream Apply 1 application topically as needed.    [name] estradiol (ESTRACE) 0.1 MG/GM vaginal cream Vaginal Estrogen Cream apply a blueberry sized amount to vaginal opening using finger tip nightly for 2 weeks then use 3 times weekly before bed. 42.5 g 6  . fluorometholone (FML) 0.1 % ophthalmic suspension Place 1 drop into both eyes 2 (two) times daily.    . fluticasone (FLONASE) 50 MCG/ACT nasal spray Place 2 sprays into both nostrils daily. 16 g 6  . loperamide (IMODIUM) 2 MG capsule Take 2 mg by mouth as needed for diarrhea or loose stools (has been taking with Clindamycin).    [name] loratadine (CLARITIN) 10 MG tablet Take 10 mg by mouth as needed for allergies or rhinitis.     [name] simvastatin (ZOCOR) 20 MG tablet Take 1 tablet (20 mg total) by mouth daily. 30 tablet 11   No current facility-administered medications on file prior to visit.     Review of Systems    Review of Systems  Constitutional: Negative for fever, appetite change, fatigue and unexpected weight change.  Eyes: Negative for pain and visual disturbance.  Respiratory: Negative for cough and shortness of breath.   Cardiovascular: Negative for cp or palpitations    Gastrointestinal: Negative for nausea, diarrhea and constipation. pos for soft stool that is darker than usual/ neg for abd pain or cramping   Genitourinary: Negative for urgency and frequency.  Skin: Negative for pallor or rash   Neurological: Negative for weakness, light-headedness, numbness and headaches.  Hematological: Negative for adenopathy. Does not bruise/bleed easily.  Psychiatric/Behavioral: Negative for dysphoric mood. The patient is not nervous/anxious.      Objective:   Physical Exam  Constitutional: She appears well-developed and well-nourished. No distress.  Well appearing elderly female  HENT:  Head:  Normocephalic and atraumatic.  Mouth/Throat: Oropharynx is clear and moist.  Eyes: Pupils are equal, round, and reactive to light. Conjunctivae and EOM are normal. No scleral icterus.  Neck: Normal range of motion. Neck supple.  Cardiovascular: Normal rate, regular rhythm and normal heart sounds.   Pulmonary/Chest: Effort normal and breath sounds normal. No respiratory distress. She has no wheezes. She has no rales.  Abdominal: Soft. Bowel sounds are normal. She exhibits no distension and no mass. There is no tenderness. There is  no rebound and no guarding.  Genitourinary: Rectum normal. Rectal exam shows no external hemorrhoid, no internal hemorrhoid, no fissure, no mass, no tenderness, anal tone normal and guaiac negative stool.  Lymphadenopathy:    She has no cervical adenopathy.  Neurological: She is alert.  Skin: Skin is warm and dry. No erythema. No pallor.  Psychiatric: She has a normal mood and affect.  Mildly anxious           Assessment & Plan:   Problem List Items Addressed This Visit      Other   Abnormal stool color    Suspect abx for uti last mo may have changed bacterial flora  Stool soft but no abd pain or diarrhea  Heme neg stool and nl rectal exam today  Will watch for abd pain or other symptoms Pt will also check ingredients on her new mvi

## 2017-02-02 NOTE — Patient Instructions (Signed)
Get back to your regular diet  Your stool is negative for blood If you develop abdominal pain or nausea or diarrhea please let us know   Try to cut back on fat in diet and keep [place] posted

## 2017-02-02 NOTE — Progress Notes (Signed)
02/03/2017 11:15 AM   Paula Jimenez 31-Oct-1926 [id number]  Referring provider: Abner Greenspan, MD [address] [place], [place] [address]  Chief Complaint  Patient presents with  . Results    RUS  . Follow-up    1 year    HPI: Patient is an 83 year old Caucasian female who presents with a history of gross hematuria, right renal angiomyolipoma, vaginal atrophy with an urethral caruncle and a history of recurrent UTI's who presents today for follow up.  History of hematuria Patient completed hematuria work up in 05/2015 with CT Urogram and cystoscopy.  No malignancies were found.  No recent gross hematuria.   Right renal angiomyolipoma CT w/wo performed on 02/28/2016 noted slight interval enlargement of right renal angiomyolipoma.  RUS performed on 01/27/2017 noted 2.6 cm right lower pole angiomyolipoma, stable from 2017 CT.  Vaginal atrophy Patient is using the vaginal estrogen cream three nights weekly.  She has not experienced any rash or irritation.    History of recurrent UTI's Patient has had five documented UTI's over the last year.  She did experience one UTI in 12/2016.  These cultures have been positive for varying bacteria with varying resistance patterns.  No symptoms at this time.    PMH: Past Medical History:  Diagnosis Date  . Allergy   . Asthma    years ago, 2-3 times a year pt experiences a problem with breathing.  . Bursitis, hip   . Family history of adverse reaction to anesthesia    DAUGHTER had a reaction to ansethesia  . Hyperlipidemia   . Hypertension   . HYPERTENSION, BENIGN 10/26/2006   Qualifier: Diagnosis of  By: Selinda Orion    . MITRAL VALVE PROLAPSE 02/25/2007   Qualifier: History of  By: Marcelino Scot CMA, [place]    . Osteopenia 08/01  . Osteoporosis   . Skin cancer 2015   nose    Surgical History: Past Surgical History:  Procedure Laterality Date  . ABDOMINAL HYSTERECTOMY  1971  . BREAST BIOPSY Right 06/99   fibrocystic   . CYSTOSCOPY WITH BIOPSY N/A 05/31/2015   Procedure: CYSTOSCOPY WITH BIOPSY/ fulgeration;  Surgeon: Hollice Espy, MD;  Location: [facility] ORS;  Service: Urology;  Laterality: N/A;  . DENTAL SURGERY    . EYE SURGERY Bilateral 2011   June and August of 2011  . ORIF RADIAL FRACTURE Left 11/99   arm fracture, radial no surgery  . TONSILLECTOMY      Home Medications:  Allergies as of 02/03/2017      Reactions   Clindamycin/lincomycin Swelling   Throat swelling   Codeine    Unknown per pt   Levofloxacin    Unknown per pt   Rofecoxib    Unknown per pt   Valtrex [valacyclovir Hcl]    Unknown per pt   Adhesive [tape] Rash   Ciprofloxacin Diarrhea   Nitrofurantoin Nausea Only   Penicillins Swelling   mouth swelling   Raloxifene Rash   Tramadol    dizzy      Medication List       Accurate as of 02/03/17 11:15 AM. Always use your most recent med list.          atenolol 50 MG tablet Commonly known as:  TENORMIN Take 1 tablet (50 mg total) by mouth daily.   AYR SALINE NASAL NA Place 1 spray into the nose daily as needed (allergies).   clotrimazole-betamethasone cream Commonly known as:  LOTRISONE Apply 1 application topically  as needed.   estradiol 0.1 MG/GM vaginal cream Commonly known as:  ESTRACE Vaginal Estrogen Cream apply a blueberry sized amount to vaginal opening using finger tip nightly for 2 weeks then use 3 times weekly before bed.   fluorometholone 0.1 % ophthalmic suspension Commonly known as:  FML Place 1 drop into both eyes 2 (two) times daily.   fluticasone 50 MCG/ACT nasal spray Commonly known as:  FLONASE Place 2 sprays into both nostrils daily.   GAVISCON EXTRA STRENGTH 160-105 MG Chew Generic drug:  Alum Hydroxide-Mag Carbonate Chew 1 tablet by mouth daily as needed (for heartburn).   loperamide 2 MG capsule Commonly known as:  IMODIUM Take 2 mg by mouth as needed for diarrhea or loose stools (has been taking with Clindamycin).   loratadine  10 MG tablet Commonly known as:  CLARITIN Take 10 mg by mouth as needed for allergies or rhinitis.   multivitamin capsule Take 1 capsule by mouth daily.   simvastatin 20 MG tablet Commonly known as:  ZOCOR Take 1 tablet (20 mg total) by mouth daily.       Allergies:  Allergies  Allergen Reactions  . Clindamycin/Lincomycin Swelling    Throat swelling  . Codeine     Unknown per pt   . Levofloxacin     Unknown per pt   . Rofecoxib     Unknown per pt   . Valtrex [Valacyclovir Hcl]     Unknown per pt   . Adhesive [Tape] Rash  . Ciprofloxacin Diarrhea  . Nitrofurantoin Nausea Only  . Penicillins Swelling    mouth swelling  . Raloxifene Rash  . Tramadol     dizzy    Family History: Family History  Problem Relation Age of Onset  . Leukemia Sister   . Cancer Sister        leukemia  . Heart disease Mother   . Diabetes Mother   . Heart failure Mother   . Stroke Father   . Kidney disease Neg Hx   . Kidney cancer Neg Hx   . Bladder Cancer Neg Hx     Social History:  reports that she has never smoked. She has never used smokeless tobacco. She reports that she does not drink alcohol or use drugs.  ROS: UROLOGY Frequent Urination?: No Hard to postpone urination?: No Burning/pain with urination?: No Get up at night to urinate?: Yes Leakage of urine?: No Urine stream starts and stops?: No Trouble starting stream?: No Do you have to strain to urinate?: No Blood in urine?: No Urinary tract infection?: No Sexually transmitted disease?: No Injury to kidneys or bladder?: No Painful intercourse?: No Weak stream?: No Currently pregnant?: No Vaginal bleeding?: No Last menstrual period?: n  Gastrointestinal Nausea?: No Vomiting?: No Indigestion/heartburn?: No Diarrhea?: No Constipation?: No  Constitutional Fever: No Night sweats?: No Weight loss?: No Fatigue?: No  Skin Skin rash/lesions?: No Itching?: No  Eyes Blurred vision?: No Double vision?:  No  Ears/Nose/Throat Sore throat?: No Sinus problems?: No  Hematologic/Lymphatic Swollen glands?: No Easy bruising?: No  Cardiovascular Leg swelling?: No Chest pain?: No  Respiratory Cough?: No Shortness of breath?: No  Endocrine Excessive thirst?: No  Musculoskeletal Back pain?: No Joint pain?: No  Neurological Headaches?: No Dizziness?: No  Psychologic Depression?: No Anxiety?: No  Physical Exam: BP (!) 148/77   Pulse 67   Ht 5\' 4"  (1.626 m)   Wt 119 lb 12.8 oz (54.3 kg)   BMI 20.56 kg/m   Constitutional: Well nourished.  Alert and oriented, No acute distress. HEENT: [place] AT, moist mucus membranes. Trachea midline, no masses. Cardiovascular: No clubbing, cyanosis, or edema. Respiratory: Normal respiratory effort, no increased work of breathing. GI: Abdomen is soft, non tender, non distended, no abdominal masses. Liver and spleen not palpable.  No hernias appreciated.  Stool sample for occult testing is not indicated.   GU: No CVA tenderness.  No bladder fullness or masses.  Atrophic external genitalia, normal pubic hair distribution, no lesions.  Urethral caruncle is noted ( 2 mm x 5 mm).  No urethral masses, tenderness and/or tenderness. No bladder fullness, tenderness or masses. Normal vagina mucosa, good estrogen effect, no discharge, no lesions, good pelvic support, no cystocele or rectocele noted.  Cervix and uterus are surgically absent.  No pelvic masses or tenderness noted.  Anus and perineum are without rashes or lesions.  Skin: No rashes, bruises or suspicious lesions. Lymph: No cervical or inguinal adenopathy. Neurologic: Grossly intact, no focal deficits, moving all 4 extremities. Psychiatric: Normal mood and affect.  Laboratory Data: Lab Results  Component Value Date   WBC 6.8 08/08/2016   HGB 13.7 08/08/2016   HCT 40.5 08/08/2016   MCV 92.4 08/08/2016   PLT 202.0 08/08/2016    Lab Results  Component Value Date   CREATININE 0.59 08/08/2016      Lab Results  Component Value Date   TSH 0.70 08/08/2016       Component Value Date/Time   CHOL 167 08/08/2016 0957   HDL 54.00 08/08/2016 0957   CHOLHDL 3 08/08/2016 0957   VLDL 20.2 08/08/2016 0957   LDLCALC 93 08/08/2016 0957    Lab Results  Component Value Date   AST 18 08/08/2016   Lab Results  Component Value Date   ALT 16 08/08/2016   I have reviewed the labs  Pertinent Imaging: CLINICAL DATA:  Angiomyolipoma of the right kidney  EXAM: RENAL / URINARY TRACT ULTRASOUND COMPLETE  COMPARISON:  Abdominal CT 01/28/2016  FINDINGS: Right Kidney:  Length: 10.5 cm. Exophytic echogenic mass from the lower pole correlating with fatty mass on previous CT, a 2.4 cm angiomyolipoma. No surrounding fluid collection or unexpected complexity to suggest interval hemorrhage. No hydronephrosis.  Left Kidney:  Length: 10 cm. Echogenicity within normal limits. No mass or hydronephrosis visualized.  Bladder:  Lobulated bladder eccentric to the right at the dome, stable from prior CT. No superimposed acute finding.  IMPRESSION: 2.6 cm right lower pole angiomyolipoma, stable from 2017 CT.   Electronically Signed   By: Monte Fantasia M.D.   On: 01/27/2017 09:31  I have independently reviewed the films.  Assessment & Plan:    1. History of hematuria  - completed hematuria work up in 05/2015, no findings of malignancies.  No recent hematuria.    - patient will contact the office if she experiencing gross hematuria  2. Right renal angiomyolipoma  - stable  - RUS in one year for surveillance  3. Vaginal atrophy  - continue vaginal estrogen cream  - RTC in one year for exam  4. Urethral caruncle   - see above  5.  History of recurrent UTI's  - no symptoms at this time  - patient will contact the office if she should experience symptoms for a CATH UA    Return in about 1 year (around 02/03/2018) for RUS and exam.  These notes generated with  voice recognition software. I apologize for typographical errors.  Zara Council, [facility] [address]  [address], [place], [place] [address] [phone]

## 2017-02-03 ENCOUNTER — Encounter: Payer: Self-pay | Admitting: Urology

## 2017-02-03 ENCOUNTER — Ambulatory Visit (INDEPENDENT_AMBULATORY_CARE_PROVIDER_SITE_OTHER): Payer: Medicare HMO | Admitting: Urology

## 2017-02-03 VITALS — BP 148/77 | HR 67 | Ht 64.0 in | Wt 119.8 lb

## 2017-02-03 DIAGNOSIS — D179 Benign lipomatous neoplasm, unspecified: Secondary | ICD-10-CM | POA: Diagnosis not present

## 2017-02-03 DIAGNOSIS — N952 Postmenopausal atrophic vaginitis: Secondary | ICD-10-CM

## 2017-02-03 DIAGNOSIS — N362 Urethral caruncle: Secondary | ICD-10-CM | POA: Diagnosis not present

## 2017-02-03 DIAGNOSIS — Z87440 Personal history of urinary (tract) infections: Secondary | ICD-10-CM

## 2017-02-03 DIAGNOSIS — Z87448 Personal history of other diseases of urinary system: Secondary | ICD-10-CM | POA: Diagnosis not present

## 2017-03-19 DIAGNOSIS — H16223 Keratoconjunctivitis sicca, not specified as Sjogren's, bilateral: Secondary | ICD-10-CM | POA: Diagnosis not present

## 2017-03-19 DIAGNOSIS — Z961 Presence of intraocular lens: Secondary | ICD-10-CM | POA: Diagnosis not present

## 2017-03-19 DIAGNOSIS — H26492 Other secondary cataract, left eye: Secondary | ICD-10-CM | POA: Diagnosis not present

## 2017-07-02 DIAGNOSIS — L84 Corns and callosities: Secondary | ICD-10-CM | POA: Diagnosis not present

## 2017-07-02 DIAGNOSIS — Z85828 Personal history of other malignant neoplasm of skin: Secondary | ICD-10-CM | POA: Diagnosis not present

## 2017-07-02 DIAGNOSIS — L570 Actinic keratosis: Secondary | ICD-10-CM | POA: Diagnosis not present

## 2017-08-06 ENCOUNTER — Telehealth: Payer: Self-pay

## 2017-08-06 NOTE — Telephone Encounter (Signed)
Attempt to reach pt to reschedule 2019 AWV and lab appt. Left message on mobile phone. If pt returns phone call, pls forward call to [phone].

## 2017-08-06 NOTE — Telephone Encounter (Signed)
Pt returned phone call. Rescheduled AWV.

## 2017-08-09 ENCOUNTER — Telehealth: Payer: Self-pay | Admitting: Family Medicine

## 2017-08-09 DIAGNOSIS — M810 Age-related osteoporosis without current pathological fracture: Secondary | ICD-10-CM

## 2017-08-09 DIAGNOSIS — I10 Essential (primary) hypertension: Secondary | ICD-10-CM

## 2017-08-09 DIAGNOSIS — E7800 Pure hypercholesterolemia, unspecified: Secondary | ICD-10-CM

## 2017-08-09 NOTE — Telephone Encounter (Signed)
-----   Message from Eustace Pen, LPN sent at 10/25/6977  3:09 PM EST ----- Regarding: Labs 2/20 Lab orders needed. Thank you.  Insurance:   [employer]

## 2017-08-12 ENCOUNTER — Ambulatory Visit (INDEPENDENT_AMBULATORY_CARE_PROVIDER_SITE_OTHER): Payer: Medicare HMO

## 2017-08-12 ENCOUNTER — Other Ambulatory Visit: Payer: Self-pay | Admitting: Primary Care

## 2017-08-12 VITALS — BP 142/70 | HR 69 | Temp 97.6°F | Ht 64.0 in | Wt 117.2 lb

## 2017-08-12 DIAGNOSIS — E7800 Pure hypercholesterolemia, unspecified: Secondary | ICD-10-CM

## 2017-08-12 DIAGNOSIS — I10 Essential (primary) hypertension: Secondary | ICD-10-CM | POA: Diagnosis not present

## 2017-08-12 DIAGNOSIS — Z0000 Encounter for general adult medical examination without abnormal findings: Secondary | ICD-10-CM

## 2017-08-12 DIAGNOSIS — M810 Age-related osteoporosis without current pathological fracture: Secondary | ICD-10-CM | POA: Diagnosis not present

## 2017-08-12 LAB — LIPID PANEL
CHOLESTEROL: 162 mg/dL (ref 0–200)
HDL: 52.6 mg/dL (ref >39.00)
LDL CALC: 86 mg/dL (ref 0–99)
NONHDL: 109.27
Total CHOL/HDL Ratio: 3
Triglycerides: 116 mg/dL (ref 0.0–149.0)
VLDL: 23.2 mg/dL (ref 0.0–40.0)

## 2017-08-12 LAB — COMPREHENSIVE METABOLIC PANEL
ALBUMIN: 3.9 g/dL (ref 3.5–5.2)
ALT: 16 U/L (ref 0–35)
AST: 17 U/L (ref 0–37)
Alkaline Phosphatase: 65 U/L (ref 39–117)
BUN: 11 mg/dL (ref 6–23)
CHLORIDE: 99 meq/L (ref 96–112)
CO2: 33 mEq/L — ABNORMAL HIGH (ref 19–32)
CREATININE: 0.66 mg/dL (ref 0.40–1.20)
Calcium: 9.5 mg/dL (ref 8.4–10.5)
GFR: 89.67 mL/min (ref >60.00)
GLUCOSE: 102 mg/dL — AB (ref 70–99)
POTASSIUM: 4.2 meq/L (ref 3.5–5.1)
SODIUM: 138 meq/L (ref 135–145)
TOTAL PROTEIN: 6.9 g/dL (ref 6.0–8.3)
Total Bilirubin: 0.5 mg/dL (ref 0.2–1.2)

## 2017-08-12 LAB — CBC WITH DIFFERENTIAL/PLATELET
BASOS PCT: 0.6 % (ref 0.0–3.0)
Basophils Absolute: 0 10*3/uL (ref 0.0–0.1)
EOS ABS: 0.2 10*3/uL (ref 0.0–0.7)
EOS PCT: 2.8 % (ref 0.0–5.0)
HCT: 41.7 % (ref 36.0–46.0)
HEMOGLOBIN: 14.2 g/dL (ref 12.0–15.0)
LYMPHS ABS: 1.5 10*3/uL (ref 0.7–4.0)
Lymphocytes Relative: 26.4 % (ref 12.0–46.0)
MCHC: 34.2 g/dL (ref 30.0–36.0)
MCV: 91.8 fl (ref 78.0–100.0)
MONO ABS: 0.5 10*3/uL (ref 0.1–1.0)
Monocytes Relative: 8.1 % (ref 3.0–12.0)
NEUTROS ABS: 3.5 10*3/uL (ref 1.4–7.7)
NEUTROS PCT: 62.1 % (ref 43.0–77.0)
PLATELETS: 217 10*3/uL (ref 150.0–400.0)
RBC: 4.54 Mil/uL (ref 3.87–5.11)
RDW: 13.6 % (ref 11.5–15.5)
WBC: 5.6 10*3/uL (ref 4.0–10.5)

## 2017-08-12 LAB — VITAMIN D 25 HYDROXY (VIT D DEFICIENCY, FRACTURES): VITD: 37.71 ng/mL (ref 30.00–100.00)

## 2017-08-12 LAB — TSH: TSH: 1.12 u[IU]/mL (ref 0.35–4.50)

## 2017-08-12 NOTE — Progress Notes (Signed)
Subjective:   Paula Jimenez is a 84 y.o. female who presents for Medicare Annual (Subsequent) preventive examination.  Review of Systems:  N/A Cardiac Risk Factors include: advanced age (>96men, >40 women);dyslipidemia;hypertension     Objective:     Vitals: BP (!) 142/70 (BP Location: Right Arm, Patient Position: Sitting, Cuff Size: Normal)   Pulse 69   Temp 97.6 F (36.4 C) (Oral)   Ht 5\' 4"  (1.626 m)   Wt 117 lb 4 oz (53.2 kg)   SpO2 94%   BMI 20.13 kg/m   Body mass index is 20.13 kg/m.  Advanced Directives 08/12/2017 08/08/2016 08/07/2015 05/14/2015 04/25/2015  Does Patient Have a Medical Advance Directive? Yes Yes Yes Yes No  Type of Paramedic of [place];Living will [place];Living will [place];Living will [place];Living will -  Does patient want to make changes to medical advance directive? - - No - Patient declined No - Patient declined -  Copy of [place] in Chart? No - copy requested No - copy requested No - copy requested No - copy requested -  Would patient like information on creating a medical advance directive? - - - No - patient declined information Yes Higher education careers adviser given    Tobacco Social History   Tobacco Use  Smoking Status Never Smoker  Smokeless Tobacco Never Used     Counseling given: No   Clinical Intake:  Pre-visit preparation completed: Yes  Pain : No/denies pain Pain Score: 0-No pain     Nutritional Status: BMI of 19-24  Normal Nutritional Risks: None Diabetes: No  How often do you need to have someone help you when you read instructions, pamphlets, or other written materials from your doctor or pharmacy?: 1 - Never What is the last grade level you completed in school?: 10th grade  Interpreter Needed?: No  Comments: pt is a widow and lives alone Information entered by :: LPinson, LPN  Past Medical History:    Diagnosis Date  . Allergy   . Asthma    years ago, 2-3 times a year pt experiences a problem with breathing.  . Bursitis, hip   . Family history of adverse reaction to anesthesia    DAUGHTER had a reaction to ansethesia  . Hyperlipidemia   . Hypertension   . HYPERTENSION, BENIGN 10/26/2006   Qualifier: Diagnosis of  By: Selinda Orion    . MITRAL VALVE PROLAPSE 02/25/2007   Qualifier: History of  By: Marcelino Scot CMA, [place]    . Osteopenia 08/01  . Osteoporosis   . Skin cancer 2015   nose   Past Surgical History:  Procedure Laterality Date  . ABDOMINAL HYSTERECTOMY  1971  . BREAST BIOPSY Right 06/99   fibrocystic  . CYSTOSCOPY WITH BIOPSY N/A 05/31/2015   Procedure: CYSTOSCOPY WITH BIOPSY/ fulgeration;  Surgeon: Hollice Espy, MD;  Location: [facility] ORS;  Service: Urology;  Laterality: N/A;  . DENTAL SURGERY    . EYE SURGERY Bilateral 2011   June and August of 2011  . ORIF RADIAL FRACTURE Left 11/99   arm fracture, radial no surgery  . TONSILLECTOMY     Family History  Problem Relation Age of Onset  . Leukemia Sister   . Cancer Sister        leukemia  . Heart disease Mother   . Diabetes Mother   . Heart failure Mother   . Stroke Father   . Kidney disease Neg  Hx   . Kidney cancer Neg Hx   . Bladder Cancer Neg Hx    Social History   Socioeconomic History  . Marital status: Widowed    Spouse name: None  . Number of children: 4  . Years of education: None  . Highest education level: None  Social Needs  . Financial resource strain: None  . Food insecurity - worry: None  . Food insecurity - inability: None  . Transportation needs - medical: None  . Transportation needs - non-medical: None  Occupational History  . Occupation: retired  Tobacco Use  . Smoking status: Never Smoker  . Smokeless tobacco: Never Used  Substance and Sexual Activity  . Alcohol use: No    Alcohol/week: 0.0 oz  . Drug use: No  . Sexual activity: No  Other Topics Concern  . None  Social  History Narrative   caring for daughter with leg injury   caring for sister with leukemia           Outpatient Encounter Medications as of 08/12/2017  Medication Sig  . Alum Hydroxide-Mag Carbonate (GAVISCON EXTRA STRENGTH) 160-105 MG CHEW Chew 1 tablet by mouth daily as needed (for heartburn).   [name] atenolol (TENORMIN) 50 MG tablet Take 1 tablet (50 mg total) by mouth daily.  Skipper Cliche SALINE NASAL NA Place 1 spray into the nose daily as needed (allergies).   . clotrimazole-betamethasone (LOTRISONE) cream Apply 1 application topically as needed.  [name] estradiol (ESTRACE) 0.1 MG/GM vaginal cream Vaginal Estrogen Cream apply a blueberry sized amount to vaginal opening using finger tip nightly for 2 weeks then use 3 times weekly before bed.  . fluorometholone (FML) 0.1 % ophthalmic suspension Place 1 drop into both eyes 2 (two) times daily.  . fluticasone (FLONASE) 50 MCG/ACT nasal spray Place 2 sprays into both nostrils daily.  [name] loperamide (IMODIUM) 2 MG capsule Take 2 mg by mouth as needed for diarrhea or loose stools (has been taking with Clindamycin).  [name] loratadine (CLARITIN) 10 MG tablet Take 10 mg by mouth as needed for allergies or rhinitis.   . Multiple Vitamin (MULTIVITAMIN) capsule Take 1 capsule by mouth daily.  . simvastatin (ZOCOR) 20 MG tablet Take 1 tablet (20 mg total) by mouth daily.   No facility-administered encounter medications on file as of 08/12/2017.     Activities of Daily Living In your present state of health, do you have any difficulty performing the following activities: 08/12/2017  Hearing? N  Vision? N  Difficulty concentrating or making decisions? Y  Walking or climbing stairs? Y  Dressing or bathing? N  Doing errands, shopping? N  Preparing Food and eating ? N  Using the Toilet? N  In the past six months, have you accidently leaked urine? N  Do you have problems with loss of bowel control? N  Managing your Medications? N  Managing your Finances? N    Housekeeping or managing your Housekeeping? N  Some recent data might be hidden    Patient Care Team: Tower, Wynelle Fanny, MD as PCP - Valinda Party, MD as Referring Physician (Ophthalmology)    Assessment:   This is a routine wellness examination for Paula Jimenez.   Hearing Screening   125Hz  250Hz  500Hz  1000Hz  2000Hz  3000Hz  4000Hz  6000Hz  8000Hz   Right ear:   40 40 40  0    Left ear:   40 40 40  0    Vision Screening Comments: Last vision exam in 2018    Exercise Activities  and Dietary recommendations Current Exercise Habits: The patient does not participate in regular exercise at present, Exercise limited by: None identified  Goals    . Increase water intake     Starting 08/12/2017, I will continue to consume at least 64 oz of water daily.        Fall Risk Fall Risk  08/12/2017 08/08/2016 08/07/2015 11/09/2012  Falls in the past year? No No No No  Risk for fall due to : - - History of fall(s) -   Depression Screen PHQ 2/9 Scores 08/12/2017 08/08/2016 08/07/2015 11/09/2012  PHQ - 2 Score 0 0 0 0  PHQ- 9 Score 3 - - -     Cognitive Function MMSE - Mini Mental State Exam 08/12/2017 08/08/2016 08/07/2015  Orientation to time 5 5 5   Orientation to Place 5 5 5   Registration 3 3 3   Attention/ Calculation 0 0 5  Recall 3 3 3   Language- name 2 objects 0 0 -  Language- repeat 1 1 1   Language- follow 3 step command 3 3 3   Language- read & follow direction 0 0 1  Write a sentence 0 0 -  Copy design 0 0 1  Total score 20 20 -       PLEASE NOTE: A Mini-Cog screen was completed. Maximum score is 20. A value of 0 denotes this part of [place] MMSE was not completed or the patient failed this part of the Mini-Cog screening.   Mini-Cog Screening Orientation to Time - Max 5 pts Orientation to Place - Max 5 pts Registration - Max 3 pts Recall - Max 3 pts Language Repeat - Max 1 pts Language Follow 3 Step Command - Max 3 pts   Immunization History  Administered Date(s)  Administered  . Influenza, High Dose Seasonal PF 05/12/2017  . Influenza,inj,Quad PF,6+ Mos 06/22/2013, 06/26/2014, 04/25/2015, 04/21/2016  . Pneumococcal Conjugate-13 06/26/2014  . Pneumococcal Polysaccharide-23 11/19/2011  . Td 04/24/1998, 11/10/2008  . Zoster 08/20/2016   Screening Tests Health Maintenance  Topic Date Due  . MAMMOGRAM  07/25/2020 (Originally 11/25/2012)  . TETANUS/TDAP  11/11/2018  . INFLUENZA VACCINE  Completed  . DEXA SCAN  Completed  . PNA vac Low Risk Adult  Completed       Plan:     I have personally reviewed, addressed, and noted the following in the patient's chart:  A. Medical and social history B. Use of alcohol, tobacco or illicit drugs  C. Current medications and supplements D. Functional ability and status E.  Nutritional status F.  Physical activity G. Advance directives H. List of other physicians I.  Hospitalizations, surgeries, and ER visits in previous 12 months J.  [place] to include hearing, vision, cognitive, depression L. Referrals and appointments - none  In addition, I have reviewed and discussed with patient certain preventive protocols, quality metrics, and best practice recommendations. A written personalized care plan for preventive services as well as general preventive health recommendations were provided to patient.  See attached scanned questionnaire for additional information.   Signed,   Lindell Noe, MHA, BS, LPN Health Coach   Lindell Noe, [place]  7/82/4235

## 2017-08-12 NOTE — Progress Notes (Signed)
PCP notes:   Health maintenance:  No gaps identified.   Abnormal screenings:   Depression score: 3 Depression screen [facility] 2/9 08/12/2017 08/08/2016 08/07/2015 11/09/2012  Decreased Interest 0 0 0 0  Down, Depressed, Hopeless 0 0 0 0  PHQ - 2 Score 0 0 0 0  Altered sleeping 1 - - -  Tired, decreased energy 1 - - -  Change in appetite 0 - - -  Feeling bad or failure about yourself  0 - - -  Trouble concentrating 0 - - -  Moving slowly or fidgety/restless 1 - - -  Suicidal thoughts 0 - - -  PHQ-9 Score 3 - - -  Difficult doing work/chores Not difficult at all - - -   Hearing - failed  Hearing Screening   125Hz  250Hz  500Hz  1000Hz  2000Hz  3000Hz  4000Hz  6000Hz  8000Hz   Right ear:   40 40 40  0    Left ear:   40 40 40  0     Patient concerns:   None  Nurse concerns:  None  Next PCP appt:   08/21/17 @   I reviewed health advisor's note, was available for consultation, and agree with documentation and plan. Loura Pardon MD

## 2017-08-12 NOTE — Patient Instructions (Signed)
Paula Jimenez , Thank you for taking time to come for your Medicare Wellness Visit. I appreciate your ongoing commitment to your health goals. Please review the following plan we discussed and let me know if I can assist you in the future.   These are the goals we discussed: Goals    . Increase water intake     Starting 08/12/2017, I will continue to consume at least 64 oz of water daily.        This is a list of the screening recommended for you and due dates:  Health Maintenance  Topic Date Due  . Mammogram  07/25/2020*  . Tetanus Vaccine  11/11/2018  . Flu Shot  Completed  . DEXA scan (bone density measurement)  Completed  . Pneumonia vaccines  Completed  *Topic was postponed. The date shown is not the original due date.   Preventive Care for Adults  A healthy lifestyle and preventive care can promote health and wellness. Preventive health guidelines for adults include the following key practices.  . A routine yearly physical is a good way to check with your health care provider about your health and preventive screening. It is a chance to share any concerns and updates on your health and to receive a thorough exam.  . Visit your dentist for a routine exam and preventive care every 6 months. Brush your teeth twice a day and floss once a day. Good oral hygiene prevents tooth decay and gum disease.  . The frequency of eye exams is based on your age, health, family medical history, use  of contact lenses, and other factors. Follow your health care provider's recommendations for frequency of eye exams.  . Eat a healthy diet. Foods like vegetables, fruits, whole grains, low-fat dairy products, and lean protein foods contain the nutrients you need without too many calories. Decrease your intake of foods high in solid fats, added sugars, and salt. Eat the right amount of calories for you. Get information about a proper diet from your health care provider, if necessary.  . Regular physical  exercise is one of the most important things you can do for your health. Most adults should get at least 150 minutes of moderate-intensity exercise (any activity that increases your heart rate and causes you to sweat) each week. In addition, most adults need muscle-strengthening exercises on 2 or more days a week.  Silver Sneakers may be a benefit available to you. To determine eligibility, you may visit the website: www.silversneakers.com or contact program at 984-554-8717 Mon-Fri between 8AM-8PM.   . Maintain a healthy weight. The body mass index (BMI) is a screening tool to identify possible weight problems. It provides an estimate of body fat based on height and weight. Your health care provider can find your BMI and can help you achieve or maintain a healthy weight.   For adults 20 years and older: ? A BMI below 18.5 is considered underweight. ? A BMI of 18.5 to 24.9 is normal. ? A BMI of 25 to 29.9 is considered overweight. ? A BMI of 30 and above is considered obese.   . Maintain normal blood lipids and cholesterol levels by exercising and minimizing your intake of saturated fat. Eat a balanced diet with plenty of fruit and vegetables. Blood tests for lipids and cholesterol should begin at age 23 and be repeated every 5 years. If your lipid or cholesterol levels are high, you are over 50, or you are at high risk for heart disease, you  may need your cholesterol levels checked more frequently. Ongoing high lipid and cholesterol levels should be treated with medicines if diet and exercise are not working.  . If you smoke, find out from your health care provider how to quit. If you do not use tobacco, please do not start.  . If you choose to drink alcohol, please do not consume more than 2 drinks per day. One drink is considered to be 12 ounces (355 mL) of beer, 5 ounces (148 mL) of wine, or 1.5 ounces (44 mL) of liquor.  . If you are 42-30 years old, ask your health care provider if you  should take aspirin to prevent strokes.  . Use sunscreen. Apply sunscreen liberally and repeatedly throughout the day. You should seek shade when your shadow is shorter than you. Protect yourself by wearing long sleeves, pants, a wide-brimmed hat, and sunglasses year round, whenever you are outdoors.  . Once a month, do a whole body skin exam, using a mirror to look at the skin on your back. Tell your health care provider of new moles, moles that have irregular borders, moles that are larger than a pencil eraser, or moles that have changed in shape or color.

## 2017-08-12 NOTE — Progress Notes (Signed)
Pre visit review using our clinic review tool, if applicable. No additional management support is needed unless otherwise documented below in the visit note. 

## 2017-08-13 ENCOUNTER — Ambulatory Visit: Payer: Medicare HMO

## 2017-08-21 ENCOUNTER — Encounter: Payer: Medicare HMO | Admitting: Family Medicine

## 2017-09-04 ENCOUNTER — Ambulatory Visit (INDEPENDENT_AMBULATORY_CARE_PROVIDER_SITE_OTHER): Payer: Medicare HMO | Admitting: Family Medicine

## 2017-09-04 ENCOUNTER — Encounter: Payer: Self-pay | Admitting: Family Medicine

## 2017-09-04 VITALS — BP 138/72 | HR 77 | Temp 98.0°F | Ht 64.0 in | Wt 118.5 lb

## 2017-09-04 DIAGNOSIS — I10 Essential (primary) hypertension: Secondary | ICD-10-CM | POA: Diagnosis not present

## 2017-09-04 DIAGNOSIS — E7800 Pure hypercholesterolemia, unspecified: Secondary | ICD-10-CM

## 2017-09-04 DIAGNOSIS — M810 Age-related osteoporosis without current pathological fracture: Secondary | ICD-10-CM | POA: Diagnosis not present

## 2017-09-04 DIAGNOSIS — Z0000 Encounter for general adult medical examination without abnormal findings: Secondary | ICD-10-CM | POA: Diagnosis not present

## 2017-09-04 MED ORDER — ATENOLOL 50 MG PO TABS: 50.0000 mg | ORAL_TABLET | Freq: Every day | ORAL | 11 refills | Status: DC

## 2017-09-04 MED ORDER — FLUTICASONE PROPIONATE 50 MCG/ACT NA SUSP: 2.0000 | Freq: Every day | NASAL | 11 refills | Status: DC

## 2017-09-04 MED ORDER — SIMVASTATIN 20 MG PO TABS: 20.0000 mg | ORAL_TABLET | Freq: Every day | ORAL | 11 refills | Status: DC

## 2017-09-04 NOTE — Patient Instructions (Addendum)
Make sure to get protein with every meal and also snacks  Try not to loose any more weight   Keep taking your one a day and vitamin D3  Take care of yourself ! Stay active physically and mentally

## 2017-09-04 NOTE — Progress Notes (Signed)
Subjective:    Patient ID: Paula Jimenez, female    DOB: 14-Apr-1927, 84 y.o.   MRN: [id number]  HPI Here for health maintenance exam and to review chronic medical problems    Has been ok in general  Some sinus issues - was off her her flonase for a while  Now doing better   Had amw on 2/20 Hearing-missed top Hz both ears -unsure if bothering her / may bother family  Has been eval before- hearing aides were not needed  If things change she will let us know  Score 0 on PHQ2 but 3 on the nine  Wt Readings from Last 3 Encounters:  09/04/17 118 lb 8 oz (53.8 kg)  08/12/17 117 lb 4 oz (53.2 kg)  02/03/17 119 lb 12.8 oz (54.3 kg)   20.34 kg/m  Down from last year slightly  Family fusses at her  Does get snacks - apple/crackers/pb  Does eat regular meals- she does not eat large amounts   Declines further mammograms due to age  Self exam  Has had a total hysterectomy  Zoster vaccine - 2/18  dexa 6/13 with osteopenia  S/p 5 y course of actonel Had declined further eval D level is 37.7- she takes calcium and D  No falls this year (careful not to move quickly and loose balance)  No fractures   bp is stable today  No cp or palpitations or headaches or edema  No side effects to medicines  BP Readings from Last 3 Encounters:  09/04/17 138/72  08/12/17 (!) 142/70  02/03/17 (!) 148/77     Hyperlipidemia Lab Results  Component Value Date   CHOL 162 08/12/2017   CHOL 167 08/08/2016   CHOL 160 10/23/2015   Lab Results  Component Value Date   HDL 52.60 08/12/2017   HDL 54.00 08/08/2016   HDL 55.60 10/23/2015   Lab Results  Component Value Date   LDLCALC 86 08/12/2017   LDLCALC 93 08/08/2016   LDLCALC 78 10/23/2015   Lab Results  Component Value Date   TRIG 116.0 08/12/2017   TRIG 101.0 08/08/2016   TRIG 131.0 10/23/2015   Lab Results  Component Value Date   CHOLHDL 3 08/12/2017   CHOLHDL 3 08/08/2016   CHOLHDL 3 10/23/2015   Lab Results  Component  Value Date   LDLDIRECT 159.5 02/19/2009   LDLDIRECT 158.2 11/10/2008   LDLDIRECT 138.6 10/26/2006  zocor and diet  Good profile  Eats healthy most of the time (eats chicken instead of beef)   Other labs  Results for orders placed or performed in visit on 08/12/17  VITAMIN D 25 Hydroxy (Vit-D Deficiency, Fractures)  Result Value Ref Range   VITD 37.71 30.00 - 100.00 ng/mL  TSH  Result Value Ref Range   TSH 1.12 0.35 - 4.50 uIU/mL  Lipid panel  Result Value Ref Range   Cholesterol 162 0 - 200 mg/dL   Triglycerides 116.0 0.0 - 149.0 mg/dL   HDL 52.60 >39.00 mg/dL   VLDL 23.2 0.0 - 40.0 mg/dL   LDL Cholesterol 86 0 - 99 mg/dL   Total CHOL/HDL Ratio 3    NonHDL 109.27   Comprehensive metabolic panel  Result Value Ref Range   Sodium 138 135 - 145 mEq/L   Potassium 4.2 3.5 - 5.1 mEq/L   Chloride 99 96 - 112 mEq/L   CO2 33 (H) 19 - 32 mEq/L   Glucose, Bld 102 (H) 70 - 99 mg/dL   BUN 11  6 - 23 mg/dL   Creatinine, Ser 0.66 0.40 - 1.20 mg/dL   Total Bilirubin 0.5 0.2 - 1.2 mg/dL   Alkaline Phosphatase 65 39 - 117 U/L   AST 17 0 - 37 U/L   ALT 16 0 - 35 U/L   Total Protein 6.9 6.0 - 8.3 g/dL   Albumin 3.9 3.5 - 5.2 g/dL   Calcium 9.5 8.4 - 10.5 mg/dL   GFR 89.67 >60.00 mL/min  CBC with Differential/Platelet  Result Value Ref Range   WBC 5.6 4.0 - 10.5 K/uL   RBC 4.54 3.87 - 5.11 Mil/uL   Hemoglobin 14.2 12.0 - 15.0 g/dL   HCT 41.7 36.0 - 46.0 %   MCV 91.8 78.0 - 100.0 fl   MCHC 34.2 30.0 - 36.0 g/dL   RDW 13.6 11.5 - 15.5 %   Platelets 217.0 150.0 - 400.0 K/uL   Neutrophils Relative % 62.1 43.0 - 77.0 %   Lymphocytes Relative 26.4 12.0 - 46.0 %   Monocytes Relative 8.1 3.0 - 12.0 %   Eosinophils Relative 2.8 0.0 - 5.0 %   Basophils Relative 0.6 0.0 - 3.0 %   Neutro Abs 3.5 1.4 - 7.7 K/uL   Lymphs Abs 1.5 0.7 - 4.0 K/uL   Monocytes Absolute 0.5 0.1 - 1.0 K/uL   Eosinophils Absolute 0.2 0.0 - 0.7 K/uL   Basophils Absolute 0.0 0.0 - 0.1 K/uL    Glucose of 102- she ate  a lot of candy during the holidays    Patient Active Problem List   Diagnosis Date Noted  . Abnormal stool color 02/02/2017  . Trochanteric bursitis, left hip 10/08/2016  . Routine general medical examination at a health care facility 08/07/2016  . SOB (shortness of breath) on exertion 05/01/2015  . Pre-operative cardiovascular examination 05/01/2015  . Frequent UTI 02/16/2015  . Epistaxis, recurrent 02/28/2014  . History of shingles 02/20/2014  . Hypoglycemia 06/22/2013  . HEARING LOSS, BILATERAL 09/23/2007  . History of melanoma 02/25/2007  . Hyperlipidemia 02/25/2007  . MITRAL VALVE PROLAPSE 02/25/2007  . ALLERGIC RHINITIS 02/25/2007  . DEGENERATIVE DISC DISEASE, LUMBOSACRAL SPINE 02/25/2007  . Osteoporosis 02/25/2007  . HYPERTENSION, BENIGN 10/26/2006   Past Medical History:  Diagnosis Date  . Allergy   . Asthma    years ago, 2-3 times a year pt experiences a problem with breathing.  . Bursitis, hip   . Family history of adverse reaction to anesthesia    DAUGHTER had a reaction to ansethesia  . Hyperlipidemia   . Hypertension   . HYPERTENSION, BENIGN 10/26/2006   Qualifier: Diagnosis of  By: Selinda Orion    . MITRAL VALVE PROLAPSE 02/25/2007   Qualifier: History of  By: Marcelino Scot CMA, [place] Bilberry    . Osteopenia 08/01  . Osteoporosis   . Skin cancer 2015   nose   Past Surgical History:  Procedure Laterality Date  . ABDOMINAL HYSTERECTOMY  1971  . BREAST BIOPSY Right 06/99   fibrocystic  . CYSTOSCOPY WITH BIOPSY N/A 05/31/2015   Procedure: CYSTOSCOPY WITH BIOPSY/ fulgeration;  Surgeon: Hollice Espy, MD;  Location: [facility] OR[facility];  Service: Urology;  Laterality: N/A;  . DENTAL SURGERY    . EYE SURGERY Bilateral 2011   June and August of 2011  . ORIF RADIAL FRACTURE Left 11/99   arm fracture, radial no surgery  . TONSILLECTOMY     Social History   Tobacco Use  . Smoking status: Never Smoker  . Smokeless tobacco: Never Used  Substance Use  Topics  . Alcohol use: No      Alcohol/week: 0.0 oz  . Drug use: No   Family History  Problem Relation Age of Onset  . Leukemia Sister   . Cancer Sister        leukemia  . Heart disease Mother   . Diabetes Mother   . Heart failure Mother   . Stroke Father   . Kidney disease Neg Hx   . Kidney cancer Neg Hx   . Bladder Cancer Neg Hx    Allergies  Allergen Reactions  . Clindamycin/Lincomycin Swelling    Throat swelling  . Codeine     Unknown per pt   . Levofloxacin     Unknown per pt   . Rofecoxib     Unknown per pt   . Valtrex [Valacyclovir Hcl]     Unknown per pt   . Adhesive [Tape] Rash  . Ciprofloxacin Diarrhea  . Nitrofurantoin Nausea Only  . Penicillins Swelling    mouth swelling  . Raloxifene Rash  . Tramadol     dizzy   Current Outpatient Medications on File Prior to Visit  Medication Sig Dispense Refill  . Alum Hydroxide-Mag Carbonate (GAVISCON EXTRA STRENGTH) 160-105 MG CHEW Chew 1 tablet by mouth daily as needed (for heartburn).     Skipper Cliche SALINE NASAL NA Place 1 spray into the nose daily as needed (allergies).     . clotrimazole-betamethasone (LOTRISONE) cream Apply 1 application topically as needed.    [name] estradiol (ESTRACE) 0.1 MG/GM vaginal cream Vaginal Estrogen Cream apply a blueberry sized amount to vaginal opening using finger tip nightly for 2 weeks then use 3 times weekly before bed. 42.5 g 6  . fluorometholone (FML) 0.1 % ophthalmic suspension Place 1 drop into both eyes 2 (two) times daily.    [name] loperamide (IMODIUM) 2 MG capsule Take 2 mg by mouth as needed for diarrhea or loose stools (has been taking with Clindamycin).    [name] loratadine (CLARITIN) 10 MG tablet Take 10 mg by mouth as needed for allergies or rhinitis.     . Multiple Vitamin (MULTIVITAMIN) capsule Take 1 capsule by mouth daily.     No current facility-administered medications on file prior to visit.      Review of Systems  Constitutional: Negative for activity change, appetite change, fatigue, fever and  unexpected weight change.  HENT: Negative for congestion, ear pain, rhinorrhea, sinus pressure and sore throat.   Eyes: Negative for pain, redness and visual disturbance.  Respiratory: Negative for cough, shortness of breath and wheezing.   Cardiovascular: Negative for chest pain and palpitations.  Gastrointestinal: Negative for abdominal pain, blood in stool, constipation and diarrhea.  Endocrine: Negative for polydipsia and polyuria.  Genitourinary: Negative for dysuria, frequency and urgency.  Musculoskeletal: Negative for arthralgias, back pain and myalgias.  Skin: Negative for pallor and rash.  Allergic/Immunologic: Negative for environmental allergies.  Neurological: Negative for dizziness, syncope and headaches.  Hematological: Negative for adenopathy. Does not bruise/bleed easily.  Psychiatric/Behavioral: Negative for decreased concentration and dysphoric mood. The patient is not nervous/anxious.        Objective:   Physical Exam  Constitutional: She appears well-developed and well-nourished. No distress.  HENT:  Head: Normocephalic and atraumatic.  Right Ear: External ear normal.  Left Ear: External ear normal.  Mouth/Throat: Oropharynx is clear and moist.  Eyes: Conjunctivae and EOM are normal. Pupils are equal, round, and reactive to light. No scleral icterus.  Neck: Normal range of motion.  Neck supple. No JVD present. Carotid bruit is not present. No thyromegaly present.  Cardiovascular: Normal rate, regular rhythm, normal heart sounds and intact distal pulses. Exam reveals no gallop.  Pulmonary/Chest: Effort normal and breath sounds normal. No respiratory distress. She has no wheezes. She exhibits no tenderness.  Abdominal: Soft. Bowel sounds are normal. She exhibits no distension, no abdominal bruit and no mass. There is no tenderness.  Genitourinary: No breast swelling, tenderness, discharge or bleeding.  Musculoskeletal: Normal range of motion. She exhibits no edema or  tenderness.  Lymphadenopathy:    She has no cervical adenopathy.  Neurological: She is alert. She has normal reflexes. No cranial nerve deficit. She exhibits normal muscle tone. Coordination normal.  Skin: Skin is warm and dry. No rash noted. No erythema. No pallor.  SKs diffusely   Psychiatric: She has a normal mood and affect.          Assessment & Plan:   Problem List Items Addressed This Visit      Cardiovascular and Mediastinum   HYPERTENSION, BENIGN    bp in fair control at this time  BP Readings from Last 1 Encounters:  09/04/17 138/72   No changes needed Disc lifstyle change with low sodium diet and exercise  Labs reviewed       Relevant Medications   atenolol (TENORMIN) 50 MG tablet   simvastatin (ZOCOR) 20 MG tablet     Musculoskeletal and Integument   Osteoporosis    Rev last dexa  No falls or fx  Past hx of 5 y of actonel tx  Declines further eval Enc ca/D and exercise         Other   Hyperlipidemia    Disc goals for lipids and reasons to control them Rev labs with pt Rev low sat fat diet in detail Good control with diet and zocor       Relevant Medications   atenolol (TENORMIN) 50 MG tablet   simvastatin (ZOCOR) 20 MG tablet   Routine general medical examination at a health care facility - Primary    Reviewed health habits including diet and exercise and skin cancer prevention Reviewed appropriate screening tests for age  Also reviewed health mt list, fam hx and immunization status , as well as social and family history   See HPI Labs rev  amw rev  Enc ca and D for bone health  Continued exercise  Will update if she becomes interested in further hearing eval

## 2017-09-06 NOTE — Assessment & Plan Note (Signed)
Reviewed health habits including diet and exercise and skin cancer prevention Reviewed appropriate screening tests for age  Also reviewed health mt list, fam hx and immunization status , as well as social and family history   See HPI Labs rev  amw rev  Enc ca and D for bone health  Continued exercise  Will update if she becomes interested in further hearing eval

## 2017-09-06 NOTE — Assessment & Plan Note (Signed)
bp in fair control at this time  BP Readings from Last 1 Encounters:  09/04/17 138/72   No changes needed Disc lifstyle change with low sodium diet and exercise  Labs reviewed

## 2017-09-06 NOTE — Assessment & Plan Note (Addendum)
Disc goals for lipids and reasons to control them Rev labs with pt Rev low sat fat diet in detail Good control with diet and zocor

## 2017-09-06 NOTE — Assessment & Plan Note (Signed)
Rev last dexa  No falls or fx  Past hx of 5 y of actonel tx  Declines further eval Enc ca/D and exercise

## 2017-09-21 DIAGNOSIS — H26492 Other secondary cataract, left eye: Secondary | ICD-10-CM | POA: Diagnosis not present

## 2017-09-21 DIAGNOSIS — Z961 Presence of intraocular lens: Secondary | ICD-10-CM | POA: Diagnosis not present

## 2017-09-21 DIAGNOSIS — H16223 Keratoconjunctivitis sicca, not specified as Sjogren's, bilateral: Secondary | ICD-10-CM | POA: Diagnosis not present

## 2017-11-05 ENCOUNTER — Ambulatory Visit (INDEPENDENT_AMBULATORY_CARE_PROVIDER_SITE_OTHER): Payer: Medicare HMO | Admitting: Family Medicine

## 2017-11-05 ENCOUNTER — Encounter: Payer: Self-pay | Admitting: Family Medicine

## 2017-11-05 VITALS — BP 126/70 | HR 70 | Temp 98.0°F | Ht 64.0 in | Wt 118.2 lb

## 2017-11-05 DIAGNOSIS — N390 Urinary tract infection, site not specified: Secondary | ICD-10-CM | POA: Insufficient documentation

## 2017-11-05 DIAGNOSIS — N3001 Acute cystitis with hematuria: Secondary | ICD-10-CM | POA: Diagnosis not present

## 2017-11-05 DIAGNOSIS — R300 Dysuria: Secondary | ICD-10-CM

## 2017-11-05 DIAGNOSIS — L600 Ingrowing nail: Secondary | ICD-10-CM | POA: Diagnosis not present

## 2017-11-05 LAB — POC URINALSYSI DIPSTICK (AUTOMATED)
BILIRUBIN UA: NEGATIVE
GLUCOSE UA: NEGATIVE
Ketones, UA: NEGATIVE
Nitrite, UA: NEGATIVE
PH UA: 6 (ref 5.0–8.0)
Protein, UA: 15
SPEC GRAV UA: 1.015 (ref 1.010–1.025)
UROBILINOGEN UA: 0.2 U/dL

## 2017-11-05 MED ORDER — SULFAMETHOXAZOLE-TRIMETHOPRIM 800-160 MG PO TABS: 1.0000 | ORAL_TABLET | Freq: Two times a day (BID) | ORAL | 0 refills | Status: DC

## 2017-11-05 NOTE — Assessment & Plan Note (Signed)
Pos ua  Pend cx  tx with septra DX Disc symptomatic care - see instructions on AVS  Update if not starting to improve in a week or if worsening

## 2017-11-05 NOTE — Assessment & Plan Note (Signed)
Lateral R great toenail  Hx of thickened fungal nail - and pt over manipulated it  Recommend soap and water soaks  Septra ds as directed  Elevate  Disc symptomatic care - see instructions on AVS Update if not starting to improve in a week or if worsening   Low threshold to ref to podiatry for nail surgery

## 2017-11-05 NOTE — Progress Notes (Signed)
Subjective:    Patient ID: Paula Jimenez, female    DOB: 06-05-1927, 84 y.o.   MRN: [id number]  HPI  Here for toe pain R  Has hx of fungal toe nails  Filed down nail of great toe  Thought it was getting ingrown  Red and painful  Draining - some pus  Used peroxide for 3 days  It felt hot  She has not had a fever    Also urinary symptoms  Started last night  Pain /burning to urinate  Saw a tinge of blood on paper with wiping  Frequency/urgency this am  No n/v or flank pain   Had diarrhea last week     Pos UA Results for orders placed or performed in visit on 11/05/17  POCT Urinalysis Dipstick (Automated)  Result Value Ref Range   Color, UA Yellow    Clarity, UA Cloudy    Glucose, UA Negative    Bilirubin, UA Negative    Ketones, UA Negative    Spec Grav, UA 1.015 1.010 - 1.025   Blood, UA 200 Ery/uL    pH, UA 6.0 5.0 - 8.0   Protein, UA 15 mg/dL    Urobilinogen, UA 0.2 0.2 or 1.0 E.U./dL   Nitrite, UA Negative    Leukocytes, UA Large (3+) (A) Negative     Patient Active Problem List   Diagnosis Date Noted  . Ingrown nail of great toe of right foot 11/05/2017  . UTI (urinary tract infection) 11/05/2017  . Trochanteric bursitis, left hip 10/08/2016  . Routine general medical examination at a health care facility 08/07/2016  . SOB (shortness of breath) on exertion 05/01/2015  . Pre-operative cardiovascular examination 05/01/2015  . Frequent UTI 02/16/2015  . Epistaxis, recurrent 02/28/2014  . History of shingles 02/20/2014  . Hypoglycemia 06/22/2013  . HEARING LOSS, BILATERAL 09/23/2007  . History of melanoma 02/25/2007  . Hyperlipidemia 02/25/2007  . MITRAL VALVE PROLAPSE 02/25/2007  . ALLERGIC RHINITIS 02/25/2007  . DEGENERATIVE DISC DISEASE, LUMBOSACRAL SPINE 02/25/2007  . Osteoporosis 02/25/2007  . HYPERTENSION, BENIGN 10/26/2006   Past Medical History:  Diagnosis Date  . Allergy   . Asthma    years ago, 2-3 times a year pt experiences a  problem with breathing.  . Bursitis, hip   . Family history of adverse reaction to anesthesia    DAUGHTER had a reaction to ansethesia  . Hyperlipidemia   . Hypertension   . HYPERTENSION, BENIGN 10/26/2006   Qualifier: Diagnosis of  By: Selinda Orion    . MITRAL VALVE PROLAPSE 02/25/2007   Qualifier: History of  By: Marcelino Scot CMA, [place] Bilberry    . Osteopenia 08/01  . Osteoporosis   . Skin cancer 2015   nose   Past Surgical History:  Procedure Laterality Date  . ABDOMINAL HYSTERECTOMY  1971  . BREAST BIOPSY Right 06/99   fibrocystic  . CYSTOSCOPY WITH BIOPSY N/A 05/31/2015   Procedure: CYSTOSCOPY WITH BIOPSY/ fulgeration;  Surgeon: Hollice Espy, MD;  Location: [facility];  Service: Urology;  Laterality: N/A;  . DENTAL SURGERY    . EYE SURGERY Bilateral 2011   June and August of 2011  . ORIF RADIAL FRACTURE Left 11/99   arm fracture, radial no surgery  . TONSILLECTOMY     Social History   Tobacco Use  . Smoking status: Never Smoker  . Smokeless tobacco: Never Used  Substance Use Topics  . Alcohol use: No    Alcohol/week: 0.0 oz  . Drug use: No  Family History  Problem Relation Age of Onset  . Leukemia Sister   . Cancer Sister        leukemia  . Heart disease Mother   . Diabetes Mother   . Heart failure Mother   . Stroke Father   . Kidney disease Neg Hx   . Kidney cancer Neg Hx   . Bladder Cancer Neg Hx    Allergies  Allergen Reactions  . Clindamycin/Lincomycin Swelling    Throat swelling  . Codeine     Unknown per pt   . Levofloxacin     Unknown per pt   . Rofecoxib     Unknown per pt   . Valtrex [Valacyclovir Hcl]     Unknown per pt   . Adhesive [Tape] Rash  . Ciprofloxacin Diarrhea  . Nitrofurantoin Nausea Only  . Penicillins Swelling    mouth swelling  . Raloxifene Rash  . Tramadol     dizzy   Current Outpatient Medications on File Prior to Visit  Medication Sig Dispense Refill  . Alum Hydroxide-Mag Carbonate (GAVISCON EXTRA STRENGTH) 160-105  MG CHEW Chew 1 tablet by mouth daily as needed (for heartburn).     [name] atenolol (TENORMIN) 50 MG tablet Take 1 tablet (50 mg total) by mouth daily. 30 tablet 11  . AYR SALINE NASAL NA Place 1 spray into the nose daily as needed (allergies).     . clotrimazole-betamethasone (LOTRISONE) cream Apply 1 application topically as needed.    [name] estradiol (ESTRACE) 0.1 MG/GM vaginal cream Vaginal Estrogen Cream apply a blueberry sized amount to vaginal opening using finger tip nightly for 2 weeks then use 3 times weekly before bed. 42.5 g 6  . fluorometholone (FML) 0.1 % ophthalmic suspension Place 1 drop into both eyes 2 (two) times daily.    . fluticasone (FLONASE) 50 MCG/ACT nasal spray Place 2 sprays into both nostrils daily. 16 g 11  . loperamide (IMODIUM) 2 MG capsule Take 2 mg by mouth as needed for diarrhea or loose stools (has been taking with Clindamycin).    [name] loratadine (CLARITIN) 10 MG tablet Take 10 mg by mouth as needed for allergies or rhinitis.     . Multiple Vitamin (MULTIVITAMIN) capsule Take 1 capsule by mouth daily.    . simvastatin (ZOCOR) 20 MG tablet Take 1 tablet (20 mg total) by mouth daily. 30 tablet 11   No current facility-administered medications on file prior to visit.      Review of Systems  Constitutional: Positive for fatigue. Negative for activity change, appetite change and fever.  HENT: Negative for congestion and sore throat.   Eyes: Negative for itching and visual disturbance.  Respiratory: Negative for cough and shortness of breath.   Cardiovascular: Negative for leg swelling.  Gastrointestinal: Negative for abdominal distention, abdominal pain, constipation, diarrhea and nausea.  Endocrine: Negative for cold intolerance and polydipsia.  Genitourinary: Positive for dysuria, frequency and urgency. Negative for difficulty urinating, flank pain and hematuria.  Musculoskeletal: Negative for myalgias.  Skin: Positive for color change. Negative for rash.       Pain  around toe nail/red/swollen  Allergic/Immunologic: Negative for immunocompromised state.  Neurological: Negative for dizziness and weakness.  Hematological: Negative for adenopathy.       Objective:   Physical Exam  Constitutional: She appears well-developed and well-nourished. No distress.  Well appearing   HENT:  Head: Normocephalic and atraumatic.  Eyes: Pupils are equal, round, and reactive to light. Conjunctivae and EOM are normal.  Neck: Normal range of motion. Neck supple.  Cardiovascular: Normal rate, regular rhythm and normal heart sounds.  Pulmonary/Chest: Effort normal and breath sounds normal.  Abdominal: Soft. Bowel sounds are normal. She exhibits no distension. There is tenderness. There is no rebound.  No cva tenderness  Mild suprapubic tenderness  Musculoskeletal: She exhibits no edema.  Lymphadenopathy:    She has no cervical adenopathy.  Neurological: She is alert.  Skin: No rash noted.  Skin on lat edge of R great toe nail is red and swollen Evidence of ingrown nail  Also thickened /yellowed nail  No streaks No drainage  Nl rom of toe   Psychiatric: She has a normal mood and affect.          Assessment & Plan:   Problem List Items Addressed This Visit      Musculoskeletal and Integument   Ingrown nail of great toe of right foot    Lateral R great toenail  Hx of thickened fungal nail - and pt over manipulated it  Recommend soap and water soaks  Septra ds as directed  Elevate  Disc symptomatic care - see instructions on AVS Update if not starting to improve in a week or if worsening   Low threshold to ref to podiatry for nail surgery        Genitourinary   UTI (urinary tract infection) - Primary    Pos ua  Pend cx  tx with septra DX Disc symptomatic care - see instructions on AVS  Update if not starting to improve in a week or if worsening        Relevant Medications   sulfamethoxazole-trimethoprim (BACTRIM DS,SEPTRA DS) 800-160 MG  tablet   Other Relevant Orders   Urine Culture    Other Visit Diagnoses    Dysuria       Relevant Orders   POCT Urinalysis Dipstick (Automated) (Completed)

## 2017-11-05 NOTE — Patient Instructions (Signed)
Drink lots of water  Take the bactrim ds as directed for uti and also toe infection   Keep toe clean with soap and water  Soak in warm soapy water as well  If no improvement in several days or if worse (streaking red/inreased pain or swelling) let me know  If this re occurs - we may need to get you to a foot doctor   We will contact you with urine culture result when it returns

## 2017-11-07 LAB — URINE CULTURE
MICRO NUMBER:: 90598180
SPECIMEN QUALITY:: ADEQUATE

## 2017-11-09 ENCOUNTER — Ambulatory Visit: Payer: Self-pay | Admitting: *Deleted

## 2017-11-09 DIAGNOSIS — L600 Ingrowing nail: Secondary | ICD-10-CM

## 2017-11-09 NOTE — Telephone Encounter (Signed)
Ref done Will route to [facility]  

## 2017-11-09 NOTE — Telephone Encounter (Signed)
I want to refer her to podiatry (foot doctor) Does she prefer [facility] or [facility]?

## 2017-11-09 NOTE — Telephone Encounter (Signed)
Pt agrees with referral, spoke to pt and person that drives her around and they both said they don't mind going to [place] they just want to see which office can get her in the quickest due to the severity of her foot issues

## 2017-11-09 NOTE — Addendum Note (Signed)
Addended by: Loura Pardon A on: 11/09/2017 05:18 PM   Modules accepted: Orders

## 2017-11-09 NOTE — Telephone Encounter (Signed)
Patient saw Dr Glori Bickers last Thursday put on anti  Biotics for feet issues   Told to call if any changes   Pt got her meds she has been soaking it in warm soapy water twice a day   Pt reports R big toe appears purple in color  - nail is loose  - denies any pain.   Patient reports  L big toe has a slight  purple color on top and the nail  loose as well but not as bad as the  R

## 2017-11-11 NOTE — Telephone Encounter (Signed)
Podiatry Appt made and patient aware.

## 2017-11-12 ENCOUNTER — Ambulatory Visit: Payer: Medicare HMO | Admitting: Podiatry

## 2017-11-12 DIAGNOSIS — M79676 Pain in unspecified toe(s): Secondary | ICD-10-CM | POA: Diagnosis not present

## 2017-11-12 DIAGNOSIS — B351 Tinea unguium: Secondary | ICD-10-CM

## 2017-11-12 DIAGNOSIS — M79609 Pain in unspecified limb: Principal | ICD-10-CM

## 2017-11-12 NOTE — Progress Notes (Signed)
  Subjective:  Patient ID: Paula Jimenez, female    DOB: 08/15/1926,  MRN: [id number]  Chief Complaint  Patient presents with  . Nail Problem    bilateral thick discolored toenails   83 y.o. female returns for the above complaint. Reports painful elongated thickened nails and calluses to the foot. Denies DM. Denies blood thinner.  Objective:  There were no vitals filed for this visit. General AA&O x3. Normal mood and affect.  Vascular Pedal pulses palpable.  Neurologic Epicritic sensation grossly intact.  Dermatologic No open lesions. Skin normal texture and turgor. Toenails x 10 elongated, thickened, dystrophic.  Orthopedic: Pain to palpation about the toenails. HPK R 5th toe, R submet 1   Assessment & Plan:  Patient was evaluated and treated and all questions answered.  Onychomycosis with pain  -Nails palliatively debrided as below. -Educated on self-care  Procedure: Nail Debridement Rationale: pain  Type of Debridement: manual, sharp debridement. Instrumentation: Nail nipper, rotary burr. Number of Nails: 10  Corns and Calluses -Educated on self care. -Pads dispensed.  No follow-ups on file.

## 2018-01-08 DIAGNOSIS — D1801 Hemangioma of skin and subcutaneous tissue: Secondary | ICD-10-CM | POA: Diagnosis not present

## 2018-01-08 DIAGNOSIS — L720 Epidermal cyst: Secondary | ICD-10-CM | POA: Diagnosis not present

## 2018-01-08 DIAGNOSIS — D2272 Melanocytic nevi of left lower limb, including hip: Secondary | ICD-10-CM | POA: Diagnosis not present

## 2018-01-08 DIAGNOSIS — D2271 Melanocytic nevi of right lower limb, including hip: Secondary | ICD-10-CM | POA: Diagnosis not present

## 2018-01-08 DIAGNOSIS — L814 Other melanin hyperpigmentation: Secondary | ICD-10-CM | POA: Diagnosis not present

## 2018-01-08 DIAGNOSIS — L570 Actinic keratosis: Secondary | ICD-10-CM | POA: Diagnosis not present

## 2018-01-08 DIAGNOSIS — Z85828 Personal history of other malignant neoplasm of skin: Secondary | ICD-10-CM | POA: Diagnosis not present

## 2018-01-08 DIAGNOSIS — L821 Other seborrheic keratosis: Secondary | ICD-10-CM | POA: Diagnosis not present

## 2018-01-08 DIAGNOSIS — D225 Melanocytic nevi of trunk: Secondary | ICD-10-CM | POA: Diagnosis not present

## 2018-01-27 ENCOUNTER — Ambulatory Visit
Admission: RE | Admit: 2018-01-27 | Discharge: 2018-01-27 | Disposition: A | Discharge status: Home / Self Care | Payer: Medicare HMO | Source: Ambulatory Visit | Attending: Urology | Admitting: Urology

## 2018-01-27 DIAGNOSIS — D179 Benign lipomatous neoplasm, unspecified: Secondary | ICD-10-CM | POA: Diagnosis not present

## 2018-01-27 DIAGNOSIS — N2889 Other specified disorders of kidney and ureter: Secondary | ICD-10-CM | POA: Diagnosis not present

## 2018-01-27 DIAGNOSIS — D3000 Benign neoplasm of unspecified kidney: Secondary | ICD-10-CM | POA: Diagnosis not present

## 2018-02-02 NOTE — Progress Notes (Signed)
02/03/2018 11:22 AM   Paula Jimenez 10/11/1926 [id number]  Referring provider: Abner Greenspan, MD [address] [place], [place] [address]  Chief Complaint  Patient presents with  . Follow-up    RUS results    HPI: Patient is an 84 year old Caucasian female who presents with a history of gross hematuria, right renal angiomyolipoma, vaginal atrophy with an urethral caruncle and a history of recurrent UTI's who presents today for follow up with her daughter, Paula Jimenez.    History of hematuria Patient completed hematuria work up in 05/2015 with CT Urogram and cystoscopy.  No malignancies were found.  No recent gross hematuria.   Right renal angiomyolipoma CT w/wo performed on 02/28/2016 noted slight interval enlargement of right renal angiomyolipoma.  RUS performed on 01/27/2017 noted 2.6 cm right lower pole angiomyolipoma, stable from 2017 CT.  RUS on 01/2018 noted angiomyolipoma of right kidney unchanged compared to prior exams.  Vaginal atrophy Patient is using the vaginal estrogen cream three nights weekly.  She has not experienced any rash or irritation.    History of recurrent UTI's Patient has had five documented UTI's over the last year.  She did experience one UTI in 10/2017 with pan sensitive E.coli.  No symptoms at this time.   Her daughter states that Paula Jimenez has been talking crazy.   Her PVR is 83 mL.  The patient says she is fine and does not want her urine checked.     PMH: Past Medical History:  Diagnosis Date  . Allergy   . Asthma    years ago, 2-3 times a year pt experiences a problem with breathing.  . Bursitis, hip   . Family history of adverse reaction to anesthesia    DAUGHTER had a reaction to ansethesia  . Hyperlipidemia   . Hypertension   . HYPERTENSION, BENIGN 10/26/2006   Qualifier: Diagnosis of  By: Selinda Orion    . MITRAL VALVE PROLAPSE 02/25/2007   Qualifier: History of  By: Marcelino Scot CMA, [place] Bilberry    . Osteopenia 08/01  .  Osteoporosis   . Skin cancer 2015   nose    Surgical History: Past Surgical History:  Procedure Laterality Date  . ABDOMINAL HYSTERECTOMY  1971  . BREAST BIOPSY Right 06/99   fibrocystic  . CYSTOSCOPY WITH BIOPSY N/A 05/31/2015   Procedure: CYSTOSCOPY WITH BIOPSY/ fulgeration;  Surgeon: Hollice Espy, MD;  Location: [facility] ORS;  Service: Urology;  Laterality: N/A;  . DENTAL SURGERY    . EYE SURGERY Bilateral 2011   June and August of 2011  . ORIF RADIAL FRACTURE Left 11/99   arm fracture, radial no surgery  . TONSILLECTOMY      Home Medications:  Allergies as of 02/03/2018      Reactions   Clindamycin/lincomycin Swelling   Throat swelling   Codeine    Unknown per pt   Levofloxacin    Unknown per pt   Rofecoxib    Unknown per pt   Valtrex [valacyclovir Hcl]    Unknown per pt   Adhesive [tape] Rash   Ciprofloxacin Diarrhea   Nitrofurantoin Nausea Only   Penicillins Swelling   mouth swelling   Raloxifene Rash   Tramadol    dizzy      Medication List        Accurate as of 02/03/18 11:22 AM. Always use your most recent med list.          atenolol 50 MG tablet Commonly known as:  TENORMIN Take 1 tablet (50 mg total) by mouth daily.   AYR SALINE NASAL NA Place 1 spray into the nose daily as needed (allergies).   clotrimazole-betamethasone cream Commonly known as:  LOTRISONE Apply 1 application topically as needed.   estradiol 0.1 MG/GM vaginal cream Commonly known as:  ESTRACE Vaginal Estrogen Cream apply a blueberry sized amount to vaginal opening using finger tip nightly for 2 weeks then use 3 times weekly before bed.   fluorometholone 0.1 % ophthalmic suspension Commonly known as:  FML Place 1 drop into both eyes 2 (two) times daily.   fluticasone 50 MCG/ACT nasal spray Commonly known as:  FLONASE Place 2 sprays into both nostrils daily.   GAVISCON EXTRA STRENGTH 160-105 MG Chew Generic drug:  Alum Hydroxide-Mag Carbonate Chew 1 tablet by mouth  daily as needed (for heartburn).   loperamide 2 MG capsule Commonly known as:  IMODIUM Take 2 mg by mouth as needed for diarrhea or loose stools (has been taking with Clindamycin).   loratadine 10 MG tablet Commonly known as:  CLARITIN Take 10 mg by mouth as needed for allergies or rhinitis.   multivitamin capsule Take 1 capsule by mouth daily.   simvastatin 20 MG tablet Commonly known as:  ZOCOR Take 1 tablet (20 mg total) by mouth daily.       Allergies:  Allergies  Allergen Reactions  . Clindamycin/Lincomycin Swelling    Throat swelling  . Codeine     Unknown per pt   . Levofloxacin     Unknown per pt   . Rofecoxib     Unknown per pt   . Valtrex [Valacyclovir Hcl]     Unknown per pt   . Adhesive [Tape] Rash  . Ciprofloxacin Diarrhea  . Nitrofurantoin Nausea Only  . Penicillins Swelling    mouth swelling  . Raloxifene Rash  . Tramadol     dizzy    Family History: Family History  Problem Relation Age of Onset  . Leukemia Sister   . Cancer Sister        leukemia  . Heart disease Mother   . Diabetes Mother   . Heart failure Mother   . Stroke Father   . Kidney disease Neg Hx   . Kidney cancer Neg Hx   . Bladder Cancer Neg Hx     Social History:  reports that she has never smoked. She has never used smokeless tobacco. She reports that she does not drink alcohol or use drugs.  ROS: UROLOGY Frequent Urination?: No Hard to postpone urination?: Yes Burning/pain with urination?: No Get up at night to urinate?: Yes Leakage of urine?: No Urine stream starts and stops?: No Trouble starting stream?: No Do you have to strain to urinate?: No Blood in urine?: No Urinary tract infection?: No Sexually transmitted disease?: No Injury to kidneys or bladder?: No Painful intercourse?: No Weak stream?: No Currently pregnant?: No Vaginal bleeding?: No Last menstrual period?: n  Gastrointestinal Nausea?: No Vomiting?: No Indigestion/heartburn?:  No Diarrhea?: No Constipation?: No  Constitutional Fever: No Night sweats?: No Weight loss?: No Fatigue?: No  Skin Skin rash/lesions?: No Itching?: No  Eyes Blurred vision?: No Double vision?: No  Ears/Nose/Throat Sore throat?: No Sinus problems?: No  Hematologic/Lymphatic Swollen glands?: No Easy bruising?: No  Cardiovascular Leg swelling?: No Chest pain?: No  Respiratory Cough?: No Shortness of breath?: No  Endocrine Excessive thirst?: No  Musculoskeletal Back pain?: No Joint pain?: No  Neurological Headaches?: No Dizziness?: No  Psychologic Depression?:  No Anxiety?: No  Physical Exam: BP 122/74 (BP Location: Left Arm, Patient Position: Sitting, Cuff Size: Normal)   Pulse 71   Ht 5\' 4"  (1.626 m)   Wt 116 lb (52.6 kg)   BMI 19.91 kg/m   Constitutional: Well nourished. Alert and oriented, No acute distress. HEENT: [place] AT, moist mucus membranes. Trachea midline, no masses. Cardiovascular: No clubbing, cyanosis, or edema. Respiratory: Normal respiratory effort, no increased work of breathing. GI: Abdomen is soft, non tender, non distended, no abdominal masses. Liver and spleen not palpable.  No hernias appreciated.  Stool sample for occult testing is not indicated.   GU: No CVA tenderness.  No bladder fullness or masses.  Atrophic external genitalia, normal pubic hair distribution, no lesions.  Normal urethral meatus, no lesions, no prolapse, no discharge.   Urethral caruncle (2 mm x 5 mm).  No bladder fullness, tenderness or masses. Normal vagina mucosa, good estrogen effect, no discharge, no lesions, good pelvic support, no cystocele or rectocele noted. Cervix, uterus and adnexa are surgically absent.  Anus and perineum are without rashes or lesions.    Skin: No rashes, bruises or suspicious lesions. Lymph: No cervical or inguinal adenopathy. Neurologic: Grossly intact, no focal deficits, moving all 4 extremities. Psychiatric: Normal mood and  affect.   Laboratory Data: Lab Results  Component Value Date   WBC 5.6 08/12/2017   HGB 14.2 08/12/2017   HCT 41.7 08/12/2017   MCV 91.8 08/12/2017   PLT 217.0 08/12/2017    Lab Results  Component Value Date   CREATININE 0.66 08/12/2017    Lab Results  Component Value Date   TSH 1.12 08/12/2017       Component Value Date/Time   CHOL 162 08/12/2017 1014   HDL 52.60 08/12/2017 1014   CHOLHDL 3 08/12/2017 1014   VLDL 23.2 08/12/2017 1014   LDLCALC 86 08/12/2017 1014    Lab Results  Component Value Date   AST 17 08/12/2017   Lab Results  Component Value Date   ALT 16 08/12/2017   I have reviewed the labs  Pertinent Imaging: CLINICAL DATA:  Follow-up right angiomyolipoma.  EXAM: RENAL / URINARY TRACT ULTRASOUND COMPLETE  COMPARISON:  January 27, 2017 ultrasound, CT scan of abdomen April 03, 2017  FINDINGS: Right Kidney:  Length: 10.5 cm. Echogenicity within normal limits. No hydronephrosis visualized. There is a 2.1 x 2.3 x 1.8 cm hyperechoic mass in the lower pole of the right kidney unchanged compared to prior ultrasound.  Left Kidney:  Length: 9.4 cm. Echogenicity within normal limits. No mass or hydronephrosis visualized.  Bladder:  Appears normal for degree of bladder distention.  IMPRESSION: Angiomyolipoma of right kidney unchanged compared to prior exams.   Electronically Signed   By: Abelardo Diesel M.D.   On: 01/27/2018 15:04 I have independently reviewed the films.  Assessment & Plan:    1. History of hematuria Completed hematuria work up in 05/2015, no findings of malignancies.  No recent hematuria.   Patient will contact the office if she experiencing gross hematuria  2. Right renal angiomyolipoma Stable RUS in one year for surveillance  3. Vaginal atrophy Continue vaginal estrogen cream RTC in one year for exam  4. Urethral caruncle   - see above  5.  History of recurrent UTI's No symptoms at this  time     Return in about 1 year (around 02/04/2019) for RUS report .  These notes generated with voice recognition software. I apologize for typographical errors.  Erich Kochan  [place], [facility] [address] [place],  [address] [phone]

## 2018-02-03 ENCOUNTER — Ambulatory Visit: Payer: Medicare HMO | Admitting: Urology

## 2018-02-03 ENCOUNTER — Encounter: Payer: Self-pay | Admitting: Urology

## 2018-02-03 VITALS — BP 122/74 | HR 71 | Ht 64.0 in | Wt 116.0 lb

## 2018-02-03 DIAGNOSIS — N952 Postmenopausal atrophic vaginitis: Secondary | ICD-10-CM | POA: Diagnosis not present

## 2018-02-03 DIAGNOSIS — Z87440 Personal history of urinary (tract) infections: Secondary | ICD-10-CM

## 2018-02-03 DIAGNOSIS — N362 Urethral caruncle: Secondary | ICD-10-CM

## 2018-02-03 DIAGNOSIS — Z87448 Personal history of other diseases of urinary system: Secondary | ICD-10-CM | POA: Diagnosis not present

## 2018-02-03 DIAGNOSIS — D179 Benign lipomatous neoplasm, unspecified: Secondary | ICD-10-CM

## 2018-02-03 LAB — BLADDER SCAN AMB NON-IMAGING: SCAN RESULT: 83

## 2018-02-03 MED ORDER — ESTRADIOL 0.1 MG/GM VA CREA
TOPICAL_CREAM | VAGINAL | 6 refills | Status: DC
Start: 2018-02-03 — End: 2018-02-05

## 2018-02-03 NOTE — Addendum Note (Signed)
Addended by: Kyra Manges on: 02/03/2018 11:30 AM   Modules accepted: Orders

## 2018-02-05 ENCOUNTER — Telehealth: Payer: Self-pay | Admitting: Urology

## 2018-02-05 MED ORDER — ESTRADIOL 0.1 MG/GM VA CREA: TOPICAL_CREAM | VAGINAL | 6 refills | Status: DC

## 2018-02-05 NOTE — Telephone Encounter (Signed)
Patient never got her estrace cream because it was printed and never sent to the pharmacy. Can someone send it to the [facility] in [place] please.   Sharyn Lull

## 2018-02-05 NOTE — Telephone Encounter (Signed)
RX printed and will send on Monday when Larene Beach can sign the script.

## 2018-04-14 ENCOUNTER — Telehealth: Payer: Self-pay | Admitting: Urology

## 2018-04-14 ENCOUNTER — Other Ambulatory Visit: Payer: Self-pay | Admitting: Family Medicine

## 2018-04-14 MED ORDER — ESTRADIOL 0.1 MG/GM VA CREA: TOPICAL_CREAM | VAGINAL | 6 refills | Status: DC

## 2018-04-14 NOTE — Telephone Encounter (Signed)
Pt needs a refill for Estrace Cream [facility] to [facility] [address] [place], [place] [address]  [phone]

## 2018-04-14 NOTE — Telephone Encounter (Signed)
Rx sent to pharmacy   

## 2018-04-20 ENCOUNTER — Ambulatory Visit (INDEPENDENT_AMBULATORY_CARE_PROVIDER_SITE_OTHER): Payer: Medicare HMO | Admitting: Family Medicine

## 2018-04-20 ENCOUNTER — Encounter: Payer: Self-pay | Admitting: Family Medicine

## 2018-04-20 VITALS — BP 130/66 | HR 68 | Temp 97.5°F | Ht 64.0 in | Wt 116.0 lb

## 2018-04-20 DIAGNOSIS — Z23 Encounter for immunization: Secondary | ICD-10-CM | POA: Diagnosis not present

## 2018-04-20 DIAGNOSIS — L84 Corns and callosities: Secondary | ICD-10-CM

## 2018-04-20 DIAGNOSIS — K648 Other hemorrhoids: Secondary | ICD-10-CM | POA: Diagnosis not present

## 2018-04-20 MED ORDER — HYDROCORTISONE ACETATE 25 MG RE SUPP: 25.0000 mg | Freq: Two times a day (BID) | RECTAL | 1 refills | Status: DC | PRN

## 2018-04-20 NOTE — Assessment & Plan Note (Signed)
Much improved after use of prep H otc  Disc avoidance of straining  Reassuring exam  Give px for anusol hc supp to use prn in the future Will f/u if symptoms worsen

## 2018-04-20 NOTE — Assessment & Plan Note (Signed)
Small corn lateral 5th toe- debrided in sterile fashion with #15 scalpel (with improvement and relief)  Disc use of cushioning and wide shoes  F.u podiatry prn

## 2018-04-20 NOTE — Progress Notes (Signed)
Subjective:    Patient ID: Paula Jimenez, female    DOB: April 17, 1927, 84 y.o.   MRN: [id number]  HPI Here for concern of hemorrhoids Also corn on toe   Wt Readings from Last 3 Encounters:  04/20/18 116 lb (52.6 kg)  02/03/18 116 lb (52.6 kg)  11/05/17 118 lb 4 oz (53.6 kg)   19.91 kg/m   A few weeks ago - something upset her stomach  Loose bm and it caused a lot of rectal discomfort  2 drops of blood came out  Used some prep H -and that helped Not bothering her now at all   Eats raisin bran daily  Keeps her bowels very regular   occ takes and immodium for diarrhea  -stops bm for 2 days  She had hemorrhoid surgery in 1955 (internal)  It really helped   Also a corn on R 5th toe Has a pad to wear over it (cushion) -still hurts to have pressure on it   Patient Active Problem List   Diagnosis Date Noted  . Internal hemorrhoid, bleeding 04/20/2018  . Corn of toe 04/20/2018  . Ingrown nail of great toe of right foot 11/05/2017  . UTI (urinary tract infection) 11/05/2017  . Trochanteric bursitis, left hip 10/08/2016  . Routine general medical examination at a health care facility 08/07/2016  . SOB (shortness of breath) on exertion 05/01/2015  . Pre-operative cardiovascular examination 05/01/2015  . Frequent UTI 02/16/2015  . Epistaxis, recurrent 02/28/2014  . History of shingles 02/20/2014  . Hypoglycemia 06/22/2013  . HEARING LOSS, BILATERAL 09/23/2007  . History of melanoma 02/25/2007  . Hyperlipidemia 02/25/2007  . MITRAL VALVE PROLAPSE 02/25/2007  . ALLERGIC RHINITIS 02/25/2007  . DEGENERATIVE DISC DISEASE, LUMBOSACRAL SPINE 02/25/2007  . Osteoporosis 02/25/2007  . HYPERTENSION, BENIGN 10/26/2006   Past Medical History:  Diagnosis Date  . Allergy   . Asthma    years ago, 2-3 times a year pt experiences a problem with breathing.  . Bursitis, hip   . Family history of adverse reaction to anesthesia    DAUGHTER had a reaction to ansethesia  . Hyperlipidemia    . Hypertension   . HYPERTENSION, BENIGN 10/26/2006   Qualifier: Diagnosis of  By: Selinda Orion    . MITRAL VALVE PROLAPSE 02/25/2007   Qualifier: History of  By: Marcelino Scot CMA, [place] Bilberry    . Osteopenia 08/01  . Osteoporosis   . Skin cancer 2015   nose   Past Surgical History:  Procedure Laterality Date  . ABDOMINAL HYSTERECTOMY  1971  . BREAST BIOPSY Right 06/99   fibrocystic  . CYSTOSCOPY WITH BIOPSY N/A 05/31/2015   Procedure: CYSTOSCOPY WITH BIOPSY/ fulgeration;  Surgeon: Hollice Espy, MD;  Location: [facility] ORS;  Service: Urology;  Laterality: N/A;  . DENTAL SURGERY    . EYE SURGERY Bilateral 2011   June and August of 2011  . ORIF RADIAL FRACTURE Left 11/99   arm fracture, radial no surgery  . TONSILLECTOMY     Social History   Tobacco Use  . Smoking status: Never Smoker  . Smokeless tobacco: Never Used  Substance Use Topics  . Alcohol use: No    Alcohol/week: 0.0 standard drinks  . Drug use: No   Family History  Problem Relation Age of Onset  . Leukemia Sister   . Cancer Sister        leukemia  . Heart disease Mother   . Diabetes Mother   . Heart failure Mother   .  Stroke Father   . Kidney disease Neg Hx   . Kidney cancer Neg Hx   . Bladder Cancer Neg Hx    Allergies  Allergen Reactions  . Clindamycin/Lincomycin Swelling    Throat swelling  . Codeine     Unknown per pt   . Levofloxacin     Unknown per pt   . Rofecoxib     Unknown per pt   . Valtrex [Valacyclovir Hcl]     Unknown per pt   . Adhesive [Tape] Rash  . Ciprofloxacin Diarrhea  . Nitrofurantoin Nausea Only  . Penicillins Swelling    mouth swelling  . Raloxifene Rash  . Tramadol     dizzy   Current Outpatient Medications on File Prior to Visit  Medication Sig Dispense Refill  . Alum Hydroxide-Mag Carbonate (GAVISCON EXTRA STRENGTH) 160-105 MG CHEW Chew 1 tablet by mouth daily as needed (for heartburn).     [name] atenolol (TENORMIN) 50 MG tablet Take 1 tablet (50 mg total) by mouth  daily. 30 tablet 11  . AYR SALINE NASAL NA Place 1 spray into the nose daily as needed (allergies).     . clotrimazole-betamethasone (LOTRISONE) cream Apply 1 application topically as needed.    [name] estradiol (ESTRACE) 0.1 MG/GM vaginal cream Vaginal Estrogen Cream apply a blueberry sized amount to vaginal opening using finger tip 3 times weekly before bed. 42.5 g 6  . fluorometholone (FML) 0.1 % ophthalmic suspension Place 1 drop into both eyes 2 (two) times daily.    . fluticasone (FLONASE) 50 MCG/ACT nasal spray Place 2 sprays into both nostrils daily. 16 g 11  . loperamide (IMODIUM) 2 MG capsule Take 2 mg by mouth as needed for diarrhea or loose stools (has been taking with Clindamycin).    [name] loratadine (CLARITIN) 10 MG tablet Take 10 mg by mouth as needed for allergies or rhinitis.     . Multiple Vitamin (MULTIVITAMIN) capsule Take 1 capsule by mouth daily.    . simvastatin (ZOCOR) 20 MG tablet Take 1 tablet (20 mg total) by mouth daily. 30 tablet 11   No current facility-administered medications on file prior to visit.     Review of Systems  Constitutional: Negative for activity change, appetite change, fatigue, fever and unexpected weight change.  HENT: Negative for congestion, ear pain, rhinorrhea, sinus pressure and sore throat.   Eyes: Negative for pain, redness and visual disturbance.  Respiratory: Negative for cough, shortness of breath and wheezing.   Cardiovascular: Negative for chest pain and palpitations.  Gastrointestinal: Positive for anal bleeding. Negative for abdominal pain, blood in stool, constipation, diarrhea, nausea, rectal pain and vomiting.       Loose stool with anal bleeding times one  Now resolved   Endocrine: Negative for polydipsia and polyuria.  Genitourinary: Negative for dysuria, frequency and urgency.  Musculoskeletal: Negative for arthralgias, back pain and myalgias.  Skin: Negative for pallor and rash.       Corn on toe is painful  Allergic/Immunologic:  Negative for environmental allergies.  Neurological: Negative for dizziness, syncope and headaches.  Hematological: Negative for adenopathy. Does not bruise/bleed easily.  Psychiatric/Behavioral: Negative for decreased concentration and dysphoric mood. The patient is not nervous/anxious.        Objective:   Physical Exam  Constitutional: She appears well-developed and well-nourished. No distress.  Well appearing elderly female  HENT:  Head: Normocephalic and atraumatic.  Mouth/Throat: Oropharynx is clear and moist.  Eyes: Pupils are equal, round, and reactive to light.  Conjunctivae and EOM are normal. Right eye exhibits no discharge. Left eye exhibits no discharge. No scleral icterus.  Neck: Normal range of motion. Neck supple.  Cardiovascular: Normal rate, regular rhythm and normal heart sounds.  Pulmonary/Chest: Effort normal and breath sounds normal. No respiratory distress.  Abdominal: Soft. Bowel sounds are normal. She exhibits no distension and no mass. There is no tenderness. There is no rebound and no guarding. No hernia.  Genitourinary: Rectal exam shows internal hemorrhoid and guaiac positive stool. Rectal exam shows no external hemorrhoid, no fissure, no mass, no tenderness and anal tone normal.  Genitourinary Comments: Nl rectal tone Trace heme pos stool (a speck)-no gross blood  Int hem palpated w/o firm mass  No tenderness  Skin surrounding anus is mildly injected and irritated   Lymphadenopathy:    She has no cervical adenopathy.  Skin: Skin is warm and dry. No rash noted.  1 cm corn on R lat 5th toe Debrided gently/sterile fashion with #15 scalpel  Pt tolerated well    Psychiatric: She has a normal mood and affect.          Assessment & Plan:   Problem List Items Addressed This Visit      Cardiovascular and Mediastinum   Internal hemorrhoid, bleeding - Primary    Much improved after use of prep H otc  Disc avoidance of straining  Reassuring exam  Give  px for anusol hc supp to use prn in the future Will f/u if symptoms worsen          Other   Corn of toe    Small corn lateral 5th toe- debrided in sterile fashion with #15 scalpel (with improvement and relief)  Disc use of cushioning and wide shoes  F.u podiatry prn         Other Visit Diagnoses    Need for influenza vaccination       Relevant Orders   Flu vaccine HIGH DOSE PF (Fluzone High dose) (Completed)

## 2018-04-20 NOTE — Patient Instructions (Signed)
I debrided the corn on your toe-I hope that helps   I px a rectal suppository for hemorrhoid symptoms  Use them as needed If symptoms worsen please let me know  Continue getting fiber in your diet

## 2018-06-13 DIAGNOSIS — N390 Urinary tract infection, site not specified: Secondary | ICD-10-CM | POA: Diagnosis not present

## 2018-06-19 ENCOUNTER — Encounter: Payer: Self-pay | Admitting: Emergency Medicine

## 2018-06-19 ENCOUNTER — Emergency Department (INDEPENDENT_AMBULATORY_CARE_PROVIDER_SITE_OTHER)
Admission: EM | Admit: 2018-06-19 | Discharge: 2018-06-19 | Disposition: A | Discharge status: Home / Self Care | Payer: Medicare HMO | Source: Home / Self Care

## 2018-06-19 DIAGNOSIS — R300 Dysuria: Secondary | ICD-10-CM

## 2018-06-19 DIAGNOSIS — N3090 Cystitis, unspecified without hematuria: Secondary | ICD-10-CM

## 2018-06-19 LAB — POCT URINALYSIS DIP (MANUAL ENTRY)
Bilirubin, UA: NEGATIVE
GLUCOSE UA: NEGATIVE mg/dL
Ketones, POC UA: NEGATIVE mg/dL
NITRITE UA: NEGATIVE
Protein Ur, POC: NEGATIVE mg/dL
Spec Grav, UA: 1.01 (ref 1.010–1.025)
UROBILINOGEN UA: 0.2 U/dL
pH, UA: 5.5 (ref 5.0–8.0)

## 2018-06-19 MED ORDER — CEPHALEXIN 500 MG PO CAPS: 500.0000 mg | ORAL_CAPSULE | Freq: Three times a day (TID) | ORAL | 0 refills | Status: DC

## 2018-06-19 NOTE — Discharge Instructions (Addendum)
Drink lots of fluids  Take the cephalexin 500 mg 1 pill 3 times daily.  There is a slight chance of allergies to cephalexin and people who are allergic to penicillins, probably less than 5% chance, but if you develop any allergy symptoms discontinue your medication and seek medical attention.  Culture is pending on the urine.  We will contact you if we need to make any changes in the antibiotic.  Advise you consider getting your primary care doctor to check a urine on you to make sure that the infection is cleared up completely since you have had this recurrence.  Return as needed

## 2018-06-19 NOTE — ED Provider Notes (Signed)
Paula Jimenez CARE    CSN: [id number] Arrival date & time: 06/19/18  1743     History   Chief Complaint Chief Complaint  Patient presents with  . Dysuria    HPI Paula Jimenez is a 84 y.o. female.   HPI 84 year old lady who lives in [place].  [facility] a week ago with Macrodantin.  He has done okay until yesterday and today she has dysuria.  She had a little urinary leakage today which is not usual for her.  She had finished the 5-day course of Macrodantin a couple of days ago.  She denies any fevers or flank pain or nausea or vomiting.  She has generally been a fairly healthy lady, but she has a history of UTIs in the past.  She has a list of medical concerns and medicines and allergies. Past Medical History:  Diagnosis Date  . Allergy   . Asthma    years ago, 2-3 times a year pt experiences a problem with breathing.  . Bursitis, hip   . Family history of adverse reaction to anesthesia    DAUGHTER had a reaction to ansethesia  . Hyperlipidemia   . Hypertension   . HYPERTENSION, BENIGN 10/26/2006   Qualifier: Diagnosis of  By: Selinda Orion    . MITRAL VALVE PROLAPSE 02/25/2007   Qualifier: History of  By: Marcelino Scot CMA, [place] Bilberry    . Osteopenia 08/01  . Osteoporosis   . Skin cancer 2015   nose    Patient Active Problem List   Diagnosis Date Noted  . Internal hemorrhoid, bleeding 04/20/2018  . Corn of toe 04/20/2018  . Ingrown nail of great toe of right foot 11/05/2017  . UTI (urinary tract infection) 11/05/2017  . Trochanteric bursitis, left hip 10/08/2016  . Routine general medical examination at a health care facility 08/07/2016  . SOB (shortness of breath) on exertion 05/01/2015  . Pre-operative cardiovascular examination 05/01/2015  . Frequent UTI 02/16/2015  . Epistaxis, recurrent 02/28/2014  . History of shingles 02/20/2014  . Hypoglycemia 06/22/2013  . HEARING LOSS, BILATERAL 09/23/2007  .  History of melanoma 02/25/2007  . Hyperlipidemia 02/25/2007  . MITRAL VALVE PROLAPSE 02/25/2007  . ALLERGIC RHINITIS 02/25/2007  . DEGENERATIVE DISC DISEASE, LUMBOSACRAL SPINE 02/25/2007  . Osteoporosis 02/25/2007  . HYPERTENSION, BENIGN 10/26/2006    Past Surgical History:  Procedure Laterality Date  . ABDOMINAL HYSTERECTOMY  1971  . BREAST BIOPSY Right 06/99   fibrocystic  . CYSTOSCOPY WITH BIOPSY N/A 05/31/2015   Procedure: CYSTOSCOPY WITH BIOPSY/ fulgeration;  Surgeon: Hollice Espy, MD;  Location: [facility] ORS;  Service: Urology;  Laterality: N/A;  . DENTAL SURGERY    . EYE SURGERY Bilateral 2011   June and August of 2011  . ORIF RADIAL FRACTURE Left 11/99   arm fracture, radial no surgery  . TONSILLECTOMY      OB History   No obstetric history on file.      Home Medications    Prior to Admission medications   Medication Sig Start Date End Date Taking? Authorizing Provider  Alum Hydroxide-Mag Carbonate (GAVISCON EXTRA STRENGTH) 160-105 MG CHEW Chew 1 tablet by mouth daily as needed (for heartburn).     [provider]  atenolol (TENORMIN) 50 MG tablet Take 1 tablet (50 mg total) by mouth daily. 09/04/17   Tower, Wynelle Fanny, MD  AYR SALINE NASAL NA Place 1 spray into the nose daily as needed (allergies).  [provider]  cephALEXin (KEFLEX) 500 MG capsule Take 1 capsule (500 mg total) by mouth 3 (three) times daily. 06/19/18   Posey Boyer, MD  clotrimazole-betamethasone (LOTRISONE) cream Apply 1 application topically as needed.    [provider]  estradiol (ESTRACE) 0.1 MG/GM vaginal cream Vaginal Estrogen Cream apply a blueberry sized amount to vaginal opening using finger tip 3 times weekly before bed. 04/14/18   McGowan, Larene Beach A, PA-C  fluorometholone (FML) 0.1 % ophthalmic suspension Place 1 drop into both eyes 2 (two) times daily. 08/12/16   [provider]  fluticasone (FLONASE) 50 MCG/ACT nasal spray Place 2 sprays into both  nostrils daily. 09/04/17   Tower, Wynelle Fanny, MD  hydrocortisone (ANUSOL-HC) 25 MG suppository Place 1 suppository (25 mg total) rectally 2 (two) times daily as needed for hemorrhoids or anal itching. 04/20/18   Tower, Wynelle Fanny, MD  loperamide (IMODIUM) 2 MG capsule Take 2 mg by mouth as needed for diarrhea or loose stools (has been taking with Clindamycin).    [provider]  loratadine (CLARITIN) 10 MG tablet Take 10 mg by mouth as needed for allergies or rhinitis.     [provider]  Multiple Vitamin (MULTIVITAMIN) capsule Take 1 capsule by mouth daily.    [provider]  simvastatin (ZOCOR) 20 MG tablet Take 1 tablet (20 mg total) by mouth daily. 09/04/17   Tower, Wynelle Fanny, MD    Family History Family History  Problem Relation Age of Onset  . Leukemia Sister   . Cancer Sister        leukemia  . Heart disease Mother   . Diabetes Mother   . Heart failure Mother   . Stroke Father   . Kidney disease Neg Hx   . Kidney cancer Neg Hx   . Bladder Cancer Neg Hx     Social History Social History   Tobacco Use  . Smoking status: Never Smoker  . Smokeless tobacco: Never Used  Substance Use Topics  . Alcohol use: No    Alcohol/week: 0.0 standard drinks  . Drug use: No     Allergies   Clindamycin/lincomycin; Codeine; Levofloxacin; Rofecoxib; Valtrex [valacyclovir hcl]; Adhesive [tape]; Ciprofloxacin; Nitrofurantoin; Penicillins; Raloxifene; and Tramadol   Review of Systems Review of Systems Constitutional: Unremarkable HEENT: Unremarkable Respiratory: Unremarkable Cardiovascular: Unremarkable Intestinal: Unremarkable Genitourinary: As above  Physical Exam Triage Vital Signs ED Triage Vitals  Enc Vitals Group     BP      Pulse      Resp      Temp      Temp src      SpO2      Weight      Height      Head Circumference      Peak Flow      Pain Score      Pain Loc      Pain Edu?      Excl. in [place]?    No data found.  Updated Vital Signs BP  (!) 195/84   Pulse 71   Temp (!) 97.4 F (36.3 C)   Resp 18   SpO2 96%   Visual Acuity Right Eye Distance:   Left Eye Distance:   Bilateral Distance:    Right Eye Near:   Left Eye Near:    Bilateral Near:     Physical Exam No major distress.  No CVA tenderness.  Abdomen soft without mass or tenderness.  UC Treatments / Results  Labs (all labs ordered are listed, but only abnormal results are displayed) Labs Reviewed  POCT URINALYSIS DIP (MANUAL ENTRY) - Abnormal; Notable for the following components:      Result Value   Clarity, UA cloudy (*)    Blood, UA large (*)    Leukocytes, UA Large (3+) (*)    All other components within normal limits  URINE CULTURE   Large leukocytes in urine. EKG None  Radiology No results found.  Procedures Procedures (including critical care time)  Medications Ordered in UC Medications - No data to display  Initial Impression / Assessment and Plan / UC Course  I have reviewed the triage vital signs and the nursing notes.  Pertinent labs & imaging results that were available during my care of the patient were reviewed by me and considered in my medical decision making (see chart for details).     Clinically she has a UTI. Final Clinical Impressions(s) / UC Diagnoses   Final diagnoses:  Cystitis  Dysuria     Discharge Instructions     Drink lots of fluids  Take the cephalexin 500 mg 1 pill 3 times daily.  There is a slight chance of allergies to cephalexin and people who are allergic to penicillins, probably less than 5% chance, but if you develop any allergy symptoms discontinue your medication and seek medical attention.  Culture is pending on the urine.  We will contact you if we need to make any changes in the antibiotic.  Advise you consider getting your primary care doctor to check a urine on you to make sure that the infection is cleared up completely since you have had this recurrence.  Return as  needed    ED Prescriptions    Medication Sig Dispense Auth. Provider   cephALEXin (KEFLEX) 500 MG capsule Take 1 capsule (500 mg total) by mouth 3 (three) times daily. 21 capsule Posey Boyer, MD     Controlled Substance Prescriptions [place] Controlled Substance Registry consulted? No   Posey Boyer, MD 06/19/18 [phone]

## 2018-06-19 NOTE — ED Triage Notes (Signed)
Pt c/o urinary frequency and dysuria, was given antibiotics without relief.

## 2018-06-21 LAB — URINE CULTURE
MICRO NUMBER: 91549907
Result:: NO GROWTH
SPECIMEN QUALITY: ADEQUATE

## 2018-06-22 ENCOUNTER — Telehealth: Payer: Self-pay | Admitting: Emergency Medicine

## 2018-06-22 NOTE — Telephone Encounter (Signed)
Feeling a lot better, continue to drink plenty of water

## 2018-06-28 ENCOUNTER — Ambulatory Visit (INDEPENDENT_AMBULATORY_CARE_PROVIDER_SITE_OTHER): Payer: Medicare HMO | Admitting: Family Medicine

## 2018-06-28 ENCOUNTER — Encounter: Payer: Self-pay | Admitting: Family Medicine

## 2018-06-28 VITALS — BP 139/80 | HR 71 | Temp 98.2°F | Ht 64.0 in | Wt 118.5 lb

## 2018-06-28 DIAGNOSIS — N390 Urinary tract infection, site not specified: Secondary | ICD-10-CM | POA: Diagnosis not present

## 2018-06-28 DIAGNOSIS — I10 Essential (primary) hypertension: Secondary | ICD-10-CM | POA: Diagnosis not present

## 2018-06-28 DIAGNOSIS — R195 Other fecal abnormalities: Secondary | ICD-10-CM | POA: Diagnosis not present

## 2018-06-28 DIAGNOSIS — Z87440 Personal history of urinary (tract) infections: Secondary | ICD-10-CM | POA: Diagnosis not present

## 2018-06-28 LAB — POC URINALSYSI DIPSTICK (AUTOMATED)
Bilirubin, UA: NEGATIVE
GLUCOSE UA: NEGATIVE
Ketones, UA: NEGATIVE
LEUKOCYTES UA: NEGATIVE
Nitrite, UA: NEGATIVE
Protein, UA: NEGATIVE
RBC UA: NEGATIVE
Spec Grav, UA: 1.01 (ref 1.010–1.025)
UROBILINOGEN UA: 0.2 U/dL
pH, UA: 6 (ref 5.0–8.0)

## 2018-06-28 NOTE — Progress Notes (Signed)
Subjective:    Patient ID: Paula Jimenez, female    DOB: 09/16/1926, 85 y.o.   MRN: [id number]  HPI Here for f/u of UC visit for uti   Also has diarrhea  She has intermittent loose stool  Had one this am  Yesterday none  Greasy foods make her worse   No abd pain or cramping Hemorrhoid- acts up occ (tries not to strain)   No fever   Wt Readings from Last 3 Encounters:  06/28/18 118 lb 8 oz (53.8 kg)  04/20/18 116 lb (52.6 kg)  02/03/18 116 lb (52.6 kg)   20.34 kg/m   [facility]- macrobid  Symptoms improved and then worsened [facility] pos for blood and leukocytes She was tx with cephalexin 500 mg tid (she did finish that)   Is feeling better -back to normal  No longer dysuria or frequency   Results for orders placed or performed in visit on 06/28/18  POCT Urinalysis Dipstick (Automated)  Result Value Ref Range   Color, UA Light Yellow    Clarity, UA Clear    Glucose, UA Negative Negative   Bilirubin, UA Negative    Ketones, UA Negative    Spec Grav, UA 1.010 1.010 - 1.025   Blood, UA Negative    pH, UA 6.0 5.0 - 8.0   Protein, UA Negative Negative   Urobilinogen, UA 0.2 0.2 or 1.0 E.U./dL   Nitrite, UA Negative    Leukocytes, UA Negative Negative      bp up on first check  BP Readings from Last 3 Encounters:  06/28/18 (!) 146/78  06/19/18 (!) 195/84  04/20/18 130/66  atenolol 50 mg once daily  Better on 2nd check  BP: 139/80   Pulse Readings from Last 3 Encounters:  06/28/18 71  06/19/18 71  04/20/18 68   H/o freq utis Had one in May  tx with septra DS  (ecoli pan sensitive)   Recent uti -cx from 12/28 showed no growth   Patient Active Problem List   Diagnosis Date Noted  . Loose stools 06/28/2018  . Internal hemorrhoid, bleeding 04/20/2018  . Corn of toe 04/20/2018  . Ingrown nail of great toe of right foot 11/05/2017  . UTI (urinary tract infection) 11/05/2017  . Trochanteric bursitis, left  hip 10/08/2016  . Routine general medical examination at a health care facility 08/07/2016  . SOB (shortness of breath) on exertion 05/01/2015  . Pre-operative cardiovascular examination 05/01/2015  . Frequent UTI 02/16/2015  . Epistaxis, recurrent 02/28/2014  . History of shingles 02/20/2014  . Hypoglycemia 06/22/2013  . HEARING LOSS, BILATERAL 09/23/2007  . History of melanoma 02/25/2007  . Hyperlipidemia 02/25/2007  . MITRAL VALVE PROLAPSE 02/25/2007  . ALLERGIC RHINITIS 02/25/2007  . DEGENERATIVE DISC DISEASE, LUMBOSACRAL SPINE 02/25/2007  . Osteoporosis 02/25/2007  . HYPERTENSION, BENIGN 10/26/2006   Past Medical History:  Diagnosis Date  . Allergy   . Asthma    years ago, 2-3 times a year pt experiences a problem with breathing.  . Bursitis, hip   . Family history of adverse reaction to anesthesia    DAUGHTER had a reaction to ansethesia  . Hyperlipidemia   . Hypertension   . HYPERTENSION, BENIGN 10/26/2006   Qualifier: Diagnosis of  By: Selinda Orion    . MITRAL VALVE PROLAPSE 02/25/2007   Qualifier: History of  By: Marcelino Scot CMA, [place] Bilberry    . Osteopenia 08/01  .  Osteoporosis   . Skin cancer 2015   nose   Past Surgical History:  Procedure Laterality Date  . ABDOMINAL HYSTERECTOMY  1971  . BREAST BIOPSY Right 06/99   fibrocystic  . CYSTOSCOPY WITH BIOPSY N/A 05/31/2015   Procedure: CYSTOSCOPY WITH BIOPSY/ fulgeration;  Surgeon: Hollice Espy, MD;  Location: [facility];  Service: Urology;  Laterality: N/A;  . DENTAL SURGERY    . EYE SURGERY Bilateral 2011   June and August of 2011  . ORIF RADIAL FRACTURE Left 11/99   arm fracture, radial no surgery  . TONSILLECTOMY     Social History   Tobacco Use  . Smoking status: Never Smoker  . Smokeless tobacco: Never Used  Substance Use Topics  . Alcohol use: No    Alcohol/week: 0.0 standard drinks  . Drug use: No   Family History  Problem Relation Age of Onset  . Leukemia Sister   . Cancer Sister        leukemia   . Heart disease Mother   . Diabetes Mother   . Heart failure Mother   . Stroke Father   . Kidney disease Neg Hx   . Kidney cancer Neg Hx   . Bladder Cancer Neg Hx    Allergies  Allergen Reactions  . Clindamycin/Lincomycin Swelling    Throat swelling  . Codeine     Unknown per pt   . Levofloxacin     Unknown per pt   . Rofecoxib     Unknown per pt   . Valtrex [Valacyclovir Hcl]     Unknown per pt   . Adhesive [Tape] Rash  . Ciprofloxacin Diarrhea  . Nitrofurantoin Nausea Only  . Penicillins Swelling    mouth swelling  . Raloxifene Rash  . Tramadol     dizzy   Current Outpatient Medications on File Prior to Visit  Medication Sig Dispense Refill  . Alum Hydroxide-Mag Carbonate (GAVISCON EXTRA STRENGTH) 160-105 MG CHEW Chew 1 tablet by mouth daily as needed (for heartburn).     [name] atenolol (TENORMIN) 50 MG tablet Take 1 tablet (50 mg total) by mouth daily. 30 tablet 11  . AYR SALINE NASAL NA Place 1 spray into the nose daily as needed (allergies).     . clotrimazole-betamethasone (LOTRISONE) cream Apply 1 application topically as needed.    [name] estradiol (ESTRACE) 0.1 MG/GM vaginal cream Vaginal Estrogen Cream apply a blueberry sized amount to vaginal opening using finger tip 3 times weekly before bed. 42.5 g 6  . fluorometholone (FML) 0.1 % ophthalmic suspension Place 1 drop into both eyes 2 (two) times daily.    . fluticasone (FLONASE) 50 MCG/ACT nasal spray Place 2 sprays into both nostrils daily. 16 g 11  . hydrocortisone (ANUSOL-HC) 25 MG suppository Place 1 suppository (25 mg total) rectally 2 (two) times daily as needed for hemorrhoids or anal itching. 12 suppository 1  . loperamide (IMODIUM) 2 MG capsule Take 2 mg by mouth as needed for diarrhea or loose stools (has been taking with Clindamycin).    [name] loratadine (CLARITIN) 10 MG tablet Take 10 mg by mouth as needed for allergies or rhinitis.     . Multiple Vitamin (MULTIVITAMIN) capsule Take 1 capsule by mouth daily.     . simvastatin (ZOCOR) 20 MG tablet Take 1 tablet (20 mg total) by mouth daily. 30 tablet 11   No current facility-administered medications on file prior to visit.      Review of Systems  Constitutional: Negative for activity  change, appetite change, fatigue, fever and unexpected weight change.  HENT: Negative for congestion, ear pain, rhinorrhea, sinus pressure and sore throat.   Eyes: Negative for pain, redness and visual disturbance.  Respiratory: Negative for cough, shortness of breath and wheezing.   Cardiovascular: Negative for chest pain and palpitations.  Gastrointestinal: Positive for diarrhea. Negative for abdominal pain, blood in stool, constipation and nausea.       Loose stool off and on  Endocrine: Negative for polydipsia and polyuria.  Genitourinary: Negative for dysuria, frequency and urgency.  Musculoskeletal: Negative for arthralgias, back pain and myalgias.  Skin: Negative for pallor and rash.  Allergic/Immunologic: Negative for environmental allergies.  Neurological: Negative for dizziness, syncope and headaches.  Hematological: Negative for adenopathy. Does not bruise/bleed easily.  Psychiatric/Behavioral: Negative for decreased concentration and dysphoric mood. The patient is not nervous/anxious.        Objective:   Physical Exam Constitutional:      General: She is not in acute distress.    Appearance: She is well-developed and normal weight. She is not ill-appearing or diaphoretic.     Comments: Well appearing  HENT:     Head: Normocephalic and atraumatic.     Mouth/Throat:     Mouth: Mucous membranes are moist.     Pharynx: Oropharynx is clear.  Eyes:     General: No scleral icterus.    Conjunctiva/sclera: Conjunctivae normal.     Pupils: Pupils are equal, round, and reactive to light.  Neck:     Musculoskeletal: Normal range of motion and neck supple.  Cardiovascular:     Rate and Rhythm: Normal rate and regular rhythm.     Heart sounds: Normal  heart sounds.  Pulmonary:     Effort: Pulmonary effort is normal. No respiratory distress.     Breath sounds: Normal breath sounds. No wheezing or rales.  Abdominal:     General: Bowel sounds are normal. There is no distension.     Palpations: Abdomen is soft. There is no mass.     Tenderness: There is no abdominal tenderness. There is no right CVA tenderness, left CVA tenderness, guarding or rebound.     Comments: No suprapubic tenderness or fullness    Lymphadenopathy:     Cervical: No cervical adenopathy.  Skin:    General: Skin is warm and dry.     Capillary Refill: Capillary refill takes less than 2 seconds.     Coloration: Skin is not pale.     Findings: No erythema.  Neurological:     Mental Status: She is alert. Mental status is at baseline.  Psychiatric:        Mood and Affect: Mood normal.           Assessment & Plan:   Problem List Items Addressed This Visit      Cardiovascular and Mediastinum   HYPERTENSION, BENIGN    bp elevated lately with illness and anxiety symptoms Improved today  BP: 139/80           Genitourinary   Frequent UTI - Primary    uti treated recently is resolved Has seen urology  Enc to keep up a good fluid intake        Other   Loose stools    Chronic and intermittent May worsen w/abx (recent) Improved now Disc diet -avoid fatty foods Keep food diary and avoid things that worsen (sweet potato)       Other Visit Diagnoses    History of UTI  Relevant Orders   POCT Urinalysis Dipstick (Automated) (Completed)

## 2018-06-28 NOTE — Assessment & Plan Note (Signed)
bp elevated lately with illness and anxiety symptoms Improved today  BP: 139/80

## 2018-06-28 NOTE — Assessment & Plan Note (Signed)
Chronic and intermittent May worsen w/abx (recent) Improved now Disc diet -avoid fatty foods Keep food diary and avoid things that worsen (sweet potato)

## 2018-06-28 NOTE — Patient Instructions (Addendum)
Urine is clear today  Drink fluids  For diarrhea-start keeping a food journal and see if you can figure out triggers (for instance dairy or fatty food)  If no improvement please let me know

## 2018-06-28 NOTE — Assessment & Plan Note (Signed)
uti treated recently is resolved Has seen urology  Enc to keep up a good fluid intake

## 2018-07-22 ENCOUNTER — Encounter: Payer: Self-pay | Admitting: Family Medicine

## 2018-07-22 ENCOUNTER — Ambulatory Visit (INDEPENDENT_AMBULATORY_CARE_PROVIDER_SITE_OTHER): Payer: Medicare HMO | Admitting: Family Medicine

## 2018-07-22 DIAGNOSIS — H6121 Impacted cerumen, right ear: Secondary | ICD-10-CM | POA: Diagnosis not present

## 2018-07-22 NOTE — Progress Notes (Signed)
Ears sx.  Going on for about 10 days.  Using ear drops w/o relief, tried irrigation.  No FCNAVD.  R ear stuffy but not painful.  L ear hearing is normal.    Meds, vitals, and allergies reviewed.   ROS: Per HPI unless specifically indicated in ROS section   nad ncat L canal and TM wnl R canal with wax noted. R ear wax removed with irrigation, recheck wnl.  R TM wnl Tolerated well. Nasal exam wnl OP wnl Hearing back to normal per patient report after irrigation.

## 2018-07-22 NOTE — Patient Instructions (Signed)
If you have more trouble, then you can use debrox drops before irrigating your ear at home.   Take care.  Glad to see you.

## 2018-07-22 NOTE — Assessment & Plan Note (Signed)
Irrigated, resolved, tolerated well.  F/u prn.  See avs. She agrees.

## 2018-08-22 ENCOUNTER — Telehealth: Payer: Self-pay | Admitting: Family Medicine

## 2018-08-22 DIAGNOSIS — M810 Age-related osteoporosis without current pathological fracture: Secondary | ICD-10-CM

## 2018-08-22 DIAGNOSIS — I10 Essential (primary) hypertension: Secondary | ICD-10-CM

## 2018-08-22 DIAGNOSIS — E7800 Pure hypercholesterolemia, unspecified: Secondary | ICD-10-CM

## 2018-08-22 NOTE — Telephone Encounter (Signed)
-----   Message from Eustace Pen, LPN sent at 5/84/4171 11:16 AM EST ----- Regarding: Labs 3/2 Lab orders needed. Thank you.

## 2018-08-23 ENCOUNTER — Ambulatory Visit: Payer: Medicare HMO

## 2018-08-23 ENCOUNTER — Other Ambulatory Visit (INDEPENDENT_AMBULATORY_CARE_PROVIDER_SITE_OTHER): Payer: Medicare HMO

## 2018-08-23 DIAGNOSIS — I10 Essential (primary) hypertension: Secondary | ICD-10-CM

## 2018-08-23 DIAGNOSIS — M810 Age-related osteoporosis without current pathological fracture: Secondary | ICD-10-CM | POA: Diagnosis not present

## 2018-08-23 DIAGNOSIS — E7800 Pure hypercholesterolemia, unspecified: Secondary | ICD-10-CM | POA: Diagnosis not present

## 2018-08-23 LAB — CBC WITH DIFFERENTIAL/PLATELET
Basophils Absolute: 0 10*3/uL (ref 0.0–0.1)
Basophils Relative: 0.6 % (ref 0.0–3.0)
Eosinophils Absolute: 0.1 10*3/uL (ref 0.0–0.7)
Eosinophils Relative: 2.1 % (ref 0.0–5.0)
HCT: 42.9 % (ref 36.0–46.0)
Hemoglobin: 14.5 g/dL (ref 12.0–15.0)
LYMPHS PCT: 27.3 % (ref 12.0–46.0)
Lymphs Abs: 1.6 10*3/uL (ref 0.7–4.0)
MCHC: 33.7 g/dL (ref 30.0–36.0)
MCV: 90.9 fl (ref 78.0–100.0)
MONO ABS: 0.5 10*3/uL (ref 0.1–1.0)
Monocytes Relative: 8.3 % (ref 3.0–12.0)
Neutro Abs: 3.5 10*3/uL (ref 1.4–7.7)
Neutrophils Relative %: 61.7 % (ref 43.0–77.0)
Platelets: 211 10*3/uL (ref 150.0–400.0)
RBC: 4.72 Mil/uL (ref 3.87–5.11)
RDW: 13.7 % (ref 11.5–15.5)
WBC: 5.7 10*3/uL (ref 4.0–10.5)

## 2018-08-23 LAB — COMPREHENSIVE METABOLIC PANEL
ALT: 14 U/L (ref 0–35)
AST: 16 U/L (ref 0–37)
Albumin: 4.2 g/dL (ref 3.5–5.2)
Alkaline Phosphatase: 72 U/L (ref 39–117)
BUN: 13 mg/dL (ref 6–23)
CALCIUM: 9.4 mg/dL (ref 8.4–10.5)
CO2: 32 mEq/L (ref 19–32)
Chloride: 99 mEq/L (ref 96–112)
Creatinine, Ser: 0.69 mg/dL (ref 0.40–1.20)
GFR: 79.96 mL/min (ref >60.00)
Glucose, Bld: 107 mg/dL — ABNORMAL HIGH (ref 70–99)
Potassium: 4.7 mEq/L (ref 3.5–5.1)
Sodium: 138 mEq/L (ref 135–145)
Total Bilirubin: 0.5 mg/dL (ref 0.2–1.2)
Total Protein: 6.9 g/dL (ref 6.0–8.3)

## 2018-08-23 LAB — LIPID PANEL
Cholesterol: 189 mg/dL (ref 0–200)
HDL: 55.3 mg/dL (ref >39.00)
LDL CALC: 106 mg/dL — AB (ref 0–99)
NonHDL: 133.34
TRIGLYCERIDES: 139 mg/dL (ref 0.0–149.0)
Total CHOL/HDL Ratio: 3
VLDL: 27.8 mg/dL (ref 0.0–40.0)

## 2018-08-23 LAB — VITAMIN D 25 HYDROXY (VIT D DEFICIENCY, FRACTURES): VITD: 46.12 ng/mL (ref 30.00–100.00)

## 2018-08-23 LAB — TSH: TSH: 1.32 u[IU]/mL (ref 0.35–4.50)

## 2018-08-25 ENCOUNTER — Encounter: Payer: Self-pay | Admitting: Family Medicine

## 2018-08-25 ENCOUNTER — Ambulatory Visit (INDEPENDENT_AMBULATORY_CARE_PROVIDER_SITE_OTHER): Payer: Medicare HMO | Admitting: Family Medicine

## 2018-08-25 VITALS — BP 137/80 | HR 70 | Temp 98.1°F | Ht 64.0 in | Wt 115.0 lb

## 2018-08-25 DIAGNOSIS — Z0000 Encounter for general adult medical examination without abnormal findings: Secondary | ICD-10-CM | POA: Diagnosis not present

## 2018-08-25 DIAGNOSIS — R7309 Other abnormal glucose: Secondary | ICD-10-CM | POA: Diagnosis not present

## 2018-08-25 DIAGNOSIS — M810 Age-related osteoporosis without current pathological fracture: Secondary | ICD-10-CM

## 2018-08-25 DIAGNOSIS — E7800 Pure hypercholesterolemia, unspecified: Secondary | ICD-10-CM | POA: Diagnosis not present

## 2018-08-25 DIAGNOSIS — Z85820 Personal history of malignant melanoma of skin: Secondary | ICD-10-CM | POA: Diagnosis not present

## 2018-08-25 DIAGNOSIS — I10 Essential (primary) hypertension: Secondary | ICD-10-CM

## 2018-08-25 DIAGNOSIS — R7303 Prediabetes: Secondary | ICD-10-CM | POA: Insufficient documentation

## 2018-08-25 MED ORDER — SIMVASTATIN 20 MG PO TABS: 20.0000 mg | ORAL_TABLET | Freq: Every day | ORAL | 11 refills | Status: DC

## 2018-08-25 MED ORDER — ATENOLOL 50 MG PO TABS: 50.0000 mg | ORAL_TABLET | Freq: Every day | ORAL | 11 refills | Status: DC

## 2018-08-25 MED ORDER — FLUTICASONE PROPIONATE 50 MCG/ACT NA SUSP: 2.0000 | Freq: Every day | NASAL | 11 refills | Status: DC

## 2018-08-25 NOTE — Assessment & Plan Note (Signed)
Reviewed health habits including diet and exercise and skin cancer prevention Reviewed appropriate screening tests for age  Also reviewed health mt list, fam hx and immunization status , as well as social and family history   See HPI Declines breast cancer screening  Stable hearing  Enc inc calories (from protein)  Good cognition  Has adv directive Reassuring labs  Enc her to stay physically and mentally active  Continue derm f/u for melanoma and to check lesion on L leg

## 2018-08-25 NOTE — Patient Instructions (Addendum)
Eat more protein and a little less sugar   Keep taking good care of yourself  Stay active  Blood pressure was improved the 2nd time   Labs are ok  Get back on your cholesterol medicine  Watch the spot on your leg- if bigger or more red- follow up with dermatology or let us know if you need a referral

## 2018-08-25 NOTE — Assessment & Plan Note (Signed)
bp in fair control at this time  BP Readings from Last 1 Encounters:  08/25/18 137/80   No changes needed Most recent labs reviewed  Disc lifstyle change with low sodium diet and exercise

## 2018-08-25 NOTE — Assessment & Plan Note (Signed)
Has had 5 y course of actonel No falls/fx D level is tx  Declines further eval

## 2018-08-25 NOTE — Assessment & Plan Note (Signed)
107 fasting  Enc more calories from protein instead of sweets

## 2018-08-25 NOTE — Progress Notes (Signed)
Subjective:    Patient ID: Paula Jimenez, female    DOB: 12/29/1926, 85 y.o.   MRN: [id number]  HPI I have personally reviewed the Medicare Annual Wellness questionnaire and have noted 1. The patient's medical and social history 2. Their use of alcohol, tobacco or illicit drugs 3. Their current medications and supplements 4. The patient's functional ability including ADL's, fall risks, home safety risks and hearing or visual             impairment. 5. Diet and physical activities 6. Evidence for depression or mood disorders  The patients weight, height, BMI have been recorded in the chart and visual acuity is per eye clinic.  I have made referrals, counseling and provided education to the patient based review of the above and I have provided the pt with a written personalized care plan for preventive services. Reviewed and updated provider list, see scanned forms.  Feels ok  Has not been doing a lot  Excited to get out doors    See scanned forms.  Routine anticipatory guidance given to patient.  See health maintenance. Colon cancer screening=none due to age  Breast cancer screening  Mammogram 6/13/declines due to age Self breast exam-no lumps  Flu vaccine-had in the fall  Tetanus vaccine 5/10 Pneumovax- utd on both  Zoster vaccine 2/18  dexa 6/13 osteopenia - declines any more of these  Has been on actonel - 5 year course completed  Vit d level 82  Falls -none (she is very careful)  Fractures -none  Advance directive-has a living will and poa (youngest son is poa) Cognitive function addressed- see scanned forms- and if abnormal then additional documentation follows.  Notices short term memory changes /mild as she gets older Nothing worrisome   Plans to stop driving in a year / she drives in short distances to familiar problems   PMH and SH reviewed  Meds, vitals, and allergies reviewed.   ROS: See HPI.  Otherwise negative.    Wt Readings from Last 3 Encounters:    08/25/18 115 lb (52.2 kg)  06/28/18 118 lb 8 oz (53.8 kg)  04/20/18 116 lb (52.6 kg)  thinks she eats too much sugar and candy  Also works to get enough protein /family cooks for her  A lot of roasted peanuts  Has cut out some of the grease  Less active  19.74 kg/m      Hearing Screening   125Hz  250Hz  500Hz  1000Hz  2000Hz  3000Hz  4000Hz  6000Hz  8000Hz   Right ear:   40 40 40  0    Left ear:   40 40 40  0    Vision Screening Comments: Pt had yearly eye exam with Dr. Nancy Fetter in 2019 no change in her hearing  She occ turns up the TV    bp is up a bit on first check today  No cp or palpitations or headaches or edema  No side effects to medicines  BP Readings from Last 3 Encounters:  08/25/18 (!) 142/76  07/22/18 122/70  06/28/18 139/80    Improved on 2nd check BP: 137/80    Hyperlipidemia Lab Results  Component Value Date   CHOL 189 08/23/2018   CHOL 162 08/12/2017   CHOL 167 08/08/2016   Lab Results  Component Value Date   HDL 55.30 08/23/2018   HDL 52.60 08/12/2017   HDL 54.00 08/08/2016   Lab Results  Component Value Date   LDLCALC 106 (H) 08/23/2018   [place] 86 08/12/2017  [place] 93 08/08/2016   Lab Results  Component Value Date   TRIG 139.0 08/23/2018   TRIG 116.0 08/12/2017   TRIG 101.0 08/08/2016   Lab Results  Component Value Date   CHOLHDL 3 08/23/2018   CHOLHDL 3 08/12/2017   CHOLHDL 3 08/08/2016   Lab Results  Component Value Date   LDLDIRECT 159.5 02/19/2009   LDLDIRECT 158.2 11/10/2008   LDLDIRECT 138.6 10/26/2006  simvastatin and diet  Was out of medicine for a week -will refill today (that is why LDL went up)  Eats very healthy   Has lesion to check on leg  Past hx of melanoma   Other labs  Glucose 107   Other labs normal  Results for orders placed or performed in visit on 08/23/18  VITAMIN D 25 Hydroxy (Vit-D Deficiency, Fractures)  Result Value Ref Range   VITD 46.12 30.00 - 100.00 ng/mL  TSH  Result Value Ref Range   TSH  1.32 0.35 - 4.50 uIU/mL  Lipid panel  Result Value Ref Range   Cholesterol 189 0 - 200 mg/dL   Triglycerides 139.0 0.0 - 149.0 mg/dL   HDL 55.30 >39.00 mg/dL   VLDL 27.8 0.0 - 40.0 mg/dL   LDL Cholesterol 106 (H) 0 - 99 mg/dL   Total CHOL/HDL Ratio 3    NonHDL 133.34   Comprehensive metabolic panel  Result Value Ref Range   Sodium 138 135 - 145 mEq/L   Potassium 4.7 3.5 - 5.1 mEq/L   Chloride 99 96 - 112 mEq/L   CO2 32 19 - 32 mEq/L   Glucose, Bld 107 (H) 70 - 99 mg/dL   BUN 13 6 - 23 mg/dL   Creatinine, Ser 0.69 0.40 - 1.20 mg/dL   Total Bilirubin 0.5 0.2 - 1.2 mg/dL   Alkaline Phosphatase 72 39 - 117 U/L   AST 16 0 - 37 U/L   ALT 14 0 - 35 U/L   Total Protein 6.9 6.0 - 8.3 g/dL   Albumin 4.2 3.5 - 5.2 g/dL   Calcium 9.4 8.4 - 10.5 mg/dL   GFR 79.96 >60.00 mL/min  CBC with Differential/Platelet  Result Value Ref Range   WBC 5.7 4.0 - 10.5 K/uL   RBC 4.72 3.87 - 5.11 Mil/uL   Hemoglobin 14.5 12.0 - 15.0 g/dL   HCT 42.9 36.0 - 46.0 %   MCV 90.9 78.0 - 100.0 fl   MCHC 33.7 30.0 - 36.0 g/dL   RDW 13.7 11.5 - 15.5 %   Platelets 211.0 150.0 - 400.0 K/uL   Neutrophils Relative % 61.7 43.0 - 77.0 %   Lymphocytes Relative 27.3 12.0 - 46.0 %   Monocytes Relative 8.3 3.0 - 12.0 %   Eosinophils Relative 2.1 0.0 - 5.0 %   Basophils Relative 0.6 0.0 - 3.0 %   Neutro Abs 3.5 1.4 - 7.7 K/uL   Lymphs Abs 1.6 0.7 - 4.0 K/uL   Monocytes Absolute 0.5 0.1 - 1.0 K/uL   Eosinophils Absolute 0.1 0.0 - 0.7 K/uL   Basophils Absolute 0.0 0.0 - 0.1 K/uL    Patient Active Problem List   Diagnosis Date Noted  . Elevated random blood glucose level 08/25/2018  . Loose stools 06/28/2018  . Internal hemorrhoid, bleeding 04/20/2018  . Corn of toe 04/20/2018  . Ingrown nail of great toe of right foot 11/05/2017  . Trochanteric bursitis, left hip 10/08/2016  . Routine general medical examination at a health care facility 08/07/2016  . Cerumen impaction 04/21/2016  .  SOB (shortness of breath) on  exertion 05/01/2015  . Pre-operative cardiovascular examination 05/01/2015  . Frequent UTI 02/16/2015  . Epistaxis, recurrent 02/28/2014  . History of shingles 02/20/2014  . Hypoglycemia 06/22/2013  . HEARING LOSS, BILATERAL 09/23/2007  . History of melanoma 02/25/2007  . Hyperlipidemia 02/25/2007  . MITRAL VALVE PROLAPSE 02/25/2007  . ALLERGIC RHINITIS 02/25/2007  . DEGENERATIVE DISC DISEASE, LUMBOSACRAL SPINE 02/25/2007  . Age related osteoporosis 02/25/2007  . HYPERTENSION, BENIGN 10/26/2006   Past Medical History:  Diagnosis Date  . Allergy   . Asthma    years ago, 2-3 times a year pt experiences a problem with breathing.  . Bursitis, hip   . Family history of adverse reaction to anesthesia    DAUGHTER had a reaction to ansethesia  . Hyperlipidemia   . Hypertension   . HYPERTENSION, BENIGN 10/26/2006   Qualifier: Diagnosis of  By: Selinda Orion    . MITRAL VALVE PROLAPSE 02/25/2007   Qualifier: History of  By: Marcelino Scot CMA, [place]    . Osteopenia 08/01  . Osteoporosis   . Skin cancer 2015   nose   Past Surgical History:  Procedure Laterality Date  . ABDOMINAL HYSTERECTOMY  1971  . BREAST BIOPSY Right 06/99   fibrocystic  . CYSTOSCOPY WITH BIOPSY N/A 05/31/2015   Procedure: CYSTOSCOPY WITH BIOPSY/ fulgeration;  Surgeon: Hollice Espy, MD;  Location: [facility] OR[facility];  Service: Urology;  Laterality: N/A;  . DENTAL SURGERY    . EYE SURGERY Bilateral 2011   June and August of 2011  . ORIF RADIAL FRACTURE Left 11/99   arm fracture, radial no surgery  . TONSILLECTOMY     Social History   Tobacco Use  . Smoking status: Never Smoker  . Smokeless tobacco: Never Used  Substance Use Topics  . Alcohol use: No    Alcohol/week: 0.0 standard drinks  . Drug use: No   Family History  Problem Relation Age of Onset  . Leukemia Sister   . Cancer Sister        leukemia  . Heart disease Mother   . Diabetes Mother   . Heart failure Mother   . Stroke Father   . Kidney disease  Neg Hx   . Kidney cancer Neg Hx   . Bladder Cancer Neg Hx    Allergies  Allergen Reactions  . Clindamycin/Lincomycin Swelling    Throat swelling  . Codeine     Unknown per pt   . Levofloxacin     Unknown per pt   . Rofecoxib     Unknown per pt   . Valtrex [Valacyclovir Hcl]     Unknown per pt   . Adhesive [Tape] Rash  . Ciprofloxacin Diarrhea  . Nitrofurantoin Nausea Only  . Penicillins Swelling    mouth swelling  . Raloxifene Rash  . Tramadol     dizzy   Current Outpatient Medications on File Prior to Visit  Medication Sig Dispense Refill  . Alum Hydroxide-Mag Carbonate (GAVISCON EXTRA STRENGTH) 160-105 MG CHEW Chew 1 tablet by mouth daily as needed (for heartburn).     Skipper Cliche SALINE NASAL NA Place 1 spray into the nose daily as needed (allergies).     . clotrimazole-betamethasone (LOTRISONE) cream Apply 1 application topically as needed.    [name] estradiol (ESTRACE) 0.1 MG/GM vaginal cream Vaginal Estrogen Cream apply a blueberry sized amount to vaginal opening using finger tip 3 times weekly before bed. 42.5 g 6  . fluorometholone (FML) 0.1 % ophthalmic suspension  Place 1 drop into both eyes 2 (two) times daily.    . hydrocortisone (ANUSOL-HC) 25 MG suppository Place 1 suppository (25 mg total) rectally 2 (two) times daily as needed for hemorrhoids or anal itching. 12 suppository 1  . loperamide (IMODIUM) 2 MG capsule Take 2 mg by mouth as needed for diarrhea or loose stools (has been taking with Clindamycin).    [name] loratadine (CLARITIN) 10 MG tablet Take 10 mg by mouth as needed for allergies or rhinitis.     . Multiple Vitamin (MULTIVITAMIN) capsule Take 1 capsule by mouth daily.     No current facility-administered medications on file prior to visit.     Review of Systems  Constitutional: Negative for activity change, appetite change, fatigue, fever and unexpected weight change.  HENT: Negative for congestion, ear pain, rhinorrhea, sinus pressure and sore throat.   Eyes:  Negative for pain, redness and visual disturbance.  Respiratory: Negative for cough, shortness of breath and wheezing.   Cardiovascular: Negative for chest pain and palpitations.  Gastrointestinal: Negative for abdominal pain, blood in stool, constipation and diarrhea.  Endocrine: Negative for polydipsia and polyuria.  Genitourinary: Negative for dysuria, frequency and urgency.  Musculoskeletal: Positive for arthralgias. Negative for back pain, joint swelling and myalgias.  Skin: Negative for pallor and rash.  Allergic/Immunologic: Negative for environmental allergies.  Neurological: Negative for dizziness, syncope and headaches.  Hematological: Negative for adenopathy. Does not bruise/bleed easily.  Psychiatric/Behavioral: Negative for decreased concentration and dysphoric mood. The patient is not nervous/anxious.        Objective:   Physical Exam Constitutional:      General: She is not in acute distress.    Appearance: Normal appearance. She is well-developed and normal weight. She is not ill-appearing.  HENT:     Head: Normocephalic and atraumatic.     Right Ear: Tympanic membrane, ear canal and external ear normal.     Left Ear: Tympanic membrane, ear canal and external ear normal.     Nose: Nose normal.     Mouth/Throat:     Mouth: Mucous membranes are moist.     Pharynx: Oropharynx is clear.  Eyes:     General: No scleral icterus.    Conjunctiva/sclera: Conjunctivae normal.     Pupils: Pupils are equal, round, and reactive to light.  Neck:     Musculoskeletal: Normal range of motion and neck supple.     Thyroid: No thyromegaly.     Vascular: No carotid bruit or JVD.  Cardiovascular:     Rate and Rhythm: Normal rate and regular rhythm.     Pulses: Normal pulses.     Heart sounds: Normal heart sounds. No gallop.   Pulmonary:     Effort: Pulmonary effort is normal. No respiratory distress.     Breath sounds: Normal breath sounds. No wheezing.  Chest:     Chest wall: No  tenderness.  Abdominal:     General: Bowel sounds are normal. There is no distension or abdominal bruit.     Palpations: Abdomen is soft. There is no mass.     Tenderness: There is no abdominal tenderness.  Genitourinary:    Comments: Breast exam: No mass, nodules, thickening, tenderness, bulging, retraction, inflamation, nipple discharge or skin changes noted.  No axillary or clavicular LA.     Musculoskeletal: Normal range of motion.        General: No tenderness.     Right lower leg: No edema.     Left lower leg:  Edema present.  Lymphadenopathy:     Cervical: No cervical adenopathy.  Skin:    General: Skin is warm and dry.     Coloration: Skin is not pale.     Findings: No erythema or rash.     Comments: Dime sized raised brown skin lesion with central erythema on L lower leg  Many sks Also lentigines  Solar aging  Neurological:     Mental Status: She is alert.     Cranial Nerves: No cranial nerve deficit.     Motor: No abnormal muscle tone.     Coordination: Coordination normal.     Deep Tendon Reflexes: Reflexes are normal and symmetric. Reflexes normal.  Psychiatric:        Mood and Affect: Mood normal.           Assessment & Plan:   Problem List Items Addressed This Visit      Cardiovascular and Mediastinum   HYPERTENSION, BENIGN    bp in fair control at this time  BP Readings from Last 1 Encounters:  08/25/18 137/80   No changes needed Most recent labs reviewed  Disc lifstyle change with low sodium diet and exercise        Relevant Medications   atenolol (TENORMIN) 50 MG tablet   simvastatin (ZOCOR) 20 MG tablet     Musculoskeletal and Integument   Age related osteoporosis    Has had 5 y course of actonel No falls/fx D level is tx  Declines further eval         Other   History of melanoma    Enc to continue annual derm f/u  Watching lesion on L lower leg       Hyperlipidemia    Disc goals for lipids and reasons to control them Rev last  labs with pt Rev low sat fat diet in detail LDL up after some missed statin doses This was refilled  Diet is good       Relevant Medications   atenolol (TENORMIN) 50 MG tablet   simvastatin (ZOCOR) 20 MG tablet   Routine general medical examination at a health care facility    Reviewed health habits including diet and exercise and skin cancer prevention Reviewed appropriate screening tests for age  Also reviewed health mt list, fam hx and immunization status , as well as social and family history   See HPI Declines breast cancer screening  Stable hearing  Enc inc calories (from protein)  Good cognition  Has adv directive Reassuring labs  Enc her to stay physically and mentally active  Continue derm f/u for melanoma and to check lesion on L leg         Elevated random blood glucose level    107 fasting  Enc more calories from protein instead of sweets        Medicare annual wellness visit, subsequent - Primary    Reviewed health habits including diet and exercise and skin cancer prevention Reviewed appropriate screening tests for age  Also reviewed health mt list, fam hx and immunization status , as well as social and family history   See HPI Declines breast cancer screening  Stable hearing  Enc inc calories (from protein)  Good cognition  Has adv directive Reassuring labs  Enc her to stay physically and mentally active  Continue derm f/u for melanoma and to check lesion on L leg

## 2018-08-25 NOTE — Assessment & Plan Note (Signed)
Enc to continue annual derm f/u  Watching lesion on L lower leg

## 2018-08-25 NOTE — Assessment & Plan Note (Signed)
Disc goals for lipids and reasons to control them Rev last labs with pt Rev low sat fat diet in detail LDL up after some missed statin doses This was refilled  Diet is good

## 2018-08-25 NOTE — Assessment & Plan Note (Addendum)
Reviewed health habits including diet and exercise and skin cancer prevention Reviewed appropriate screening tests for age  Also reviewed health mt list, fam hx and immunization status , as well as social and family history   See HPI Declines breast cancer screening  Stable hearing  Enc inc calories (from protein)  Good cognition  Has adv directive Reassuring labs  Enc her to stay physically and mentally active  Continue derm f/u for melanoma and to check lesion on L leg

## 2018-11-26 ENCOUNTER — Telehealth: Payer: Self-pay

## 2018-11-26 NOTE — Telephone Encounter (Signed)
See appt notes 11/29/18 at 8 AM.FYI to Dr Glori Bickers.

## 2018-11-29 ENCOUNTER — Other Ambulatory Visit: Payer: Self-pay

## 2018-11-29 ENCOUNTER — Encounter: Payer: Self-pay | Admitting: Family Medicine

## 2018-11-29 ENCOUNTER — Ambulatory Visit (INDEPENDENT_AMBULATORY_CARE_PROVIDER_SITE_OTHER): Payer: Medicare HMO | Admitting: Family Medicine

## 2018-11-29 VITALS — BP 140/84 | HR 81 | Temp 98.7°F | Ht 64.0 in | Wt 116.0 lb

## 2018-11-29 DIAGNOSIS — N3001 Acute cystitis with hematuria: Secondary | ICD-10-CM | POA: Diagnosis not present

## 2018-11-29 DIAGNOSIS — N3000 Acute cystitis without hematuria: Secondary | ICD-10-CM | POA: Insufficient documentation

## 2018-11-29 LAB — POC URINALSYSI DIPSTICK (AUTOMATED)
Bilirubin, UA: NEGATIVE
Glucose, UA: NEGATIVE
Ketones, UA: NEGATIVE
Nitrite, UA: NEGATIVE
Protein, UA: NEGATIVE
Spec Grav, UA: 1.02 (ref 1.010–1.025)
Urobilinogen, UA: 0.2 E.U./dL
pH, UA: 6 (ref 5.0–8.0)

## 2018-11-29 MED ORDER — CEPHALEXIN 500 MG PO CAPS: 500.0000 mg | ORAL_CAPSULE | Freq: Two times a day (BID) | ORAL | 0 refills | Status: DC

## 2018-11-29 NOTE — Progress Notes (Signed)
Subjective:    Patient ID: Paula Jimenez, female    DOB: [birth year], 85 y.o.   MRN: [id number]  HPI Here for ? Blood in urine or other cause   Sunday night she used her estrace cream Her fingernails were long-may have jabbed herself ? She had some itching  Washed up and used vaseline and itching went away   She has found 3 times a thin spot of blood on paper and also in underwear   She has had a hysterectomy  No frequency or urgency or dysuria  No blood in stool  UA: Has 2 plus leuk and 10 ery/ul blood  SG nl  Results for orders placed or performed in visit on 11/29/18  POCT Urinalysis Dipstick (Automated)  Result Value Ref Range   Color, UA yellow    Clarity, UA slightly cloudy    Glucose, UA Negative Negative   Bilirubin, UA negative    Ketones, UA negative    Spec Grav, UA 1.020 1.010 - 1.025   Blood, UA 1+    pH, UA 6.0 5.0 - 8.0   Protein, UA Negative Negative   Urobilinogen, UA 0.2 0.2 or 1.0 E.U./dL   Nitrite, UA negative    Leukocytes, UA Moderate (2+) (A) Negative     Took keflex last time and no reaction Does have pcn allergy    Patient Active Problem List   Diagnosis Date Noted  . Acute cystitis 11/29/2018  . Elevated random blood glucose level 08/25/2018  . Medicare annual wellness visit, subsequent 08/25/2018  . Loose stools 06/28/2018  . Internal hemorrhoid, bleeding 04/20/2018  . Corn of toe 04/20/2018  . Ingrown nail of great toe of right foot 11/05/2017  . Trochanteric bursitis, left hip 10/08/2016  . Routine general medical examination at a health care facility 08/07/2016  . Pre-operative cardiovascular examination 05/01/2015  . Frequent UTI 02/16/2015  . Epistaxis, recurrent 02/28/2014  . History of shingles 02/20/2014  . HEARING LOSS, BILATERAL 09/23/2007  . History of melanoma 02/25/2007  . Hyperlipidemia 02/25/2007  . MITRAL VALVE PROLAPSE 02/25/2007  . ALLERGIC RHINITIS 02/25/2007  . DEGENERATIVE DISC DISEASE, LUMBOSACRAL SPINE  02/25/2007  . Age related osteoporosis 02/25/2007  . HYPERTENSION, BENIGN 10/26/2006   Past Medical History:  Diagnosis Date  . Allergy   . Asthma    years ago, 2-3 times a year pt experiences a problem with breathing.  . Bursitis, hip   . Family history of adverse reaction to anesthesia    DAUGHTER had a reaction to ansethesia  . Hyperlipidemia   . Hypertension   . HYPERTENSION, BENIGN 10/26/2006   Qualifier: Diagnosis of  By: Selinda Orion    . MITRAL VALVE PROLAPSE 02/25/2007   Qualifier: History of  By: Marcelino Scot CMA, [place] Bilberry    . Osteopenia 08/01  . Osteoporosis   . Skin cancer 2015   nose   Past Surgical History:  Procedure Laterality Date  . ABDOMINAL HYSTERECTOMY  1971  . BREAST BIOPSY Right 06/99   fibrocystic  . CYSTOSCOPY WITH BIOPSY N/A 05/31/2015   Procedure: CYSTOSCOPY WITH BIOPSY/ fulgeration;  Surgeon: Hollice Espy, MD;  Location: [facility];  Service: Urology;  Laterality: N/A;  . DENTAL SURGERY    . EYE SURGERY Bilateral 2011   June and August of 2011  . ORIF RADIAL FRACTURE Left 11/99   arm fracture, radial no surgery  . TONSILLECTOMY     Social History   Tobacco Use  . Smoking status: Never Smoker  .  Smokeless tobacco: Never Used  Substance Use Topics  . Alcohol use: No    Alcohol/week: 0.0 standard drinks  . Drug use: No   Family History  Problem Relation Age of Onset  . Leukemia Sister   . Cancer Sister        leukemia  . Heart disease Mother   . Diabetes Mother   . Heart failure Mother   . Stroke Father   . Kidney disease Neg Hx   . Kidney cancer Neg Hx   . Bladder Cancer Neg Hx    Allergies  Allergen Reactions  . Clindamycin/Lincomycin Swelling    Throat swelling  . Codeine     Unknown per pt   . Levofloxacin     Unknown per pt   . Rofecoxib     Unknown per pt   . Valtrex [Valacyclovir Hcl]     Unknown per pt   . Adhesive [Tape] Rash  . Ciprofloxacin Diarrhea  . Nitrofurantoin Nausea Only  . Penicillins Swelling     mouth swelling  . Raloxifene Rash  . Tramadol     dizzy   Current Outpatient Medications on File Prior to Visit  Medication Sig Dispense Refill  . Alum Hydroxide-Mag Carbonate (GAVISCON EXTRA STRENGTH) 160-105 MG CHEW Chew 1 tablet by mouth daily as needed (for heartburn).     [name] atenolol (TENORMIN) 50 MG tablet Take 1 tablet (50 mg total) by mouth daily. 30 tablet 11  . AYR SALINE NASAL NA Place 1 spray into the nose daily as needed (allergies).     . clotrimazole-betamethasone (LOTRISONE) cream Apply 1 application topically as needed.    [name] estradiol (ESTRACE) 0.1 MG/GM vaginal cream Vaginal Estrogen Cream apply a blueberry sized amount to vaginal opening using finger tip 3 times weekly before bed. 42.5 g 6  . fluorometholone (FML) 0.1 % ophthalmic suspension Place 1 drop into both eyes 2 (two) times daily.    . fluticasone (FLONASE) 50 MCG/ACT nasal spray Place 2 sprays into both nostrils daily. 16 g 11  . hydrocortisone (ANUSOL-HC) 25 MG suppository Place 1 suppository (25 mg total) rectally 2 (two) times daily as needed for hemorrhoids or anal itching. 12 suppository 1  . loperamide (IMODIUM) 2 MG capsule Take 2 mg by mouth as needed for diarrhea or loose stools (has been taking with Clindamycin).    [name] loratadine (CLARITIN) 10 MG tablet Take 10 mg by mouth as needed for allergies or rhinitis.     . Multiple Vitamin (MULTIVITAMIN) capsule Take 1 capsule by mouth daily.    . simvastatin (ZOCOR) 20 MG tablet Take 1 tablet (20 mg total) by mouth daily. 30 tablet 11   No current facility-administered medications on file prior to visit.      Review of Systems  Constitutional: Negative for activity change, appetite change, fatigue, fever and unexpected weight change.  HENT: Negative for congestion, rhinorrhea, sore throat and trouble swallowing.   Eyes: Negative for pain, redness, itching and visual disturbance.  Respiratory: Negative for cough, chest tightness, shortness of breath and  wheezing.   Cardiovascular: Negative for chest pain and palpitations.  Gastrointestinal: Negative for abdominal pain, blood in stool, constipation, diarrhea and nausea.  Endocrine: Negative for cold intolerance, heat intolerance, polydipsia and polyuria.  Genitourinary: Positive for frequency and hematuria. Negative for decreased urine volume, difficulty urinating, dysuria, flank pain, pelvic pain, urgency and vaginal discharge.       Blood on toilet paper when wiping from the front  Scant amount  Baseline urinary frequency w/o change   Musculoskeletal: Negative for arthralgias, joint swelling and myalgias.  Skin: Negative for pallor and rash.  Neurological: Negative for dizziness, tremors, weakness, numbness and headaches.  Hematological: Negative for adenopathy. Does not bruise/bleed easily.  Psychiatric/Behavioral: Negative for decreased concentration and dysphoric mood. The patient is not nervous/anxious.        Objective:   Physical Exam Constitutional:      General: She is not in acute distress.    Appearance: Normal appearance. She is normal weight. She is not ill-appearing.  HENT:     Head: Normocephalic and atraumatic.     Mouth/Throat:     Mouth: Mucous membranes are moist.     Pharynx: Oropharynx is clear.  Eyes:     General: No scleral icterus.    Extraocular Movements: Extraocular movements intact.     Conjunctiva/sclera: Conjunctivae normal.     Pupils: Pupils are equal, round, and reactive to light.  Neck:     Vascular: No carotid bruit.  Cardiovascular:     Rate and Rhythm: Normal rate and regular rhythm.     Pulses: Normal pulses.     Heart sounds: Normal heart sounds.  Pulmonary:     Effort: Pulmonary effort is normal. No respiratory distress.     Breath sounds: Normal breath sounds. No wheezing or rales.  Abdominal:     General: Abdomen is flat. Bowel sounds are normal. There is no distension.     Palpations: Abdomen is soft. There is no mass.      Tenderness: There is no abdominal tenderness.     Comments: No suprapubic tenderness or fullness  No cva tenderness   Genitourinary:    Comments: Vaginal mucosa is slightly atrophic  No skin or mucosal injury or source of bleeding  Small urethral caruncle noted  No tenderness with pelvic exam Lymphadenopathy:     Cervical: No cervical adenopathy.  Skin:    General: Skin is warm and dry.     Coloration: Skin is not pale.     Findings: No erythema or rash.  Neurological:     Mental Status: She is alert. Mental status is at baseline.  Psychiatric:        Mood and Affect: Mood normal.           Assessment & Plan:   Problem List Items Addressed This Visit      Genitourinary   Acute cystitis - Primary    Suspect this is the cause of blood seen when wiping/ pink color  Vaginal exam did not reveal injury or abn mucosa (some dryness)  Urethral caruncle (small) noted - this could also cause blood For pos UA -tx with keflex (she can take this despite pcn allergy)  Enc fluids Update if not starting to improve in a several days or if worsening   cx pending-will update        Relevant Orders   Urine Culture   POCT Urinalysis Dipstick (Automated) (Completed)

## 2018-11-29 NOTE — Patient Instructions (Signed)
We will culture your urine and call you with results  Take keflex as directed  Drink lots of water

## 2018-11-29 NOTE — Assessment & Plan Note (Addendum)
Suspect this is the cause of blood seen when wiping/ pink color  Vaginal exam did not reveal injury or abn mucosa (some dryness)  Urethral caruncle (small) noted - this could also cause blood For pos UA -tx with keflex (she can take this despite pcn allergy)  Enc fluids Update if not starting to improve in a several days or if worsening   cx pending-will update

## 2018-12-01 ENCOUNTER — Telehealth: Payer: Self-pay | Admitting: *Deleted

## 2018-12-01 LAB — URINE CULTURE
MICRO NUMBER:: 546680
Result:: NO GROWTH
SPECIMEN QUALITY:: ADEQUATE

## 2018-12-01 NOTE — Telephone Encounter (Signed)
Left VM requesting pt to call the office back regarding urine cx 

## 2018-12-02 NOTE — Telephone Encounter (Signed)
Addressed in result notes  

## 2018-12-10 ENCOUNTER — Other Ambulatory Visit: Payer: Self-pay

## 2018-12-10 ENCOUNTER — Ambulatory Visit (INDEPENDENT_AMBULATORY_CARE_PROVIDER_SITE_OTHER): Payer: Medicare HMO | Admitting: Family Medicine

## 2018-12-10 ENCOUNTER — Encounter: Payer: Self-pay | Admitting: Family Medicine

## 2018-12-10 VITALS — Ht 64.0 in

## 2018-12-10 DIAGNOSIS — N3001 Acute cystitis with hematuria: Secondary | ICD-10-CM | POA: Diagnosis not present

## 2018-12-10 DIAGNOSIS — B373 Candidiasis of vulva and vagina: Secondary | ICD-10-CM | POA: Diagnosis not present

## 2018-12-10 DIAGNOSIS — B3731 Acute candidiasis of vulva and vagina: Secondary | ICD-10-CM

## 2018-12-10 DIAGNOSIS — N362 Urethral caruncle: Secondary | ICD-10-CM

## 2018-12-10 MED ORDER — FLUCONAZOLE 150 MG PO TABS: 150.0000 mg | ORAL_TABLET | Freq: Once | ORAL | 0 refills | Status: AC

## 2018-12-10 MED ORDER — CEPHALEXIN 500 MG PO CAPS: 500.0000 mg | ORAL_CAPSULE | Freq: Three times a day (TID) | ORAL | 0 refills | Status: DC

## 2018-12-10 NOTE — Patient Instructions (Addendum)
Possible urinary tract infection... complete another 3 of antibiotics.  When complete if still have vaginal itching.Paula KitchenMarland KitchenTake fluconazole.  Continue estrace cream.  Bleeding sounds most consistent with urethral caruncle.

## 2018-12-10 NOTE — Progress Notes (Signed)
VIRTUAL VISIT Due to national recommendations of social distancing due to [place] 19, a virtual visit is felt to be most appropriate for this patient at this time.   I connected with the patient on 12/10/18 at  4:00 PM EDT by virtual telehealth platform and verified that I am speaking with the correct person using two identifiers.  Interactive audio and video telecommunications were attempted between this provider and patient, however failed, due to patient having technical difficulties OR patient did not have access to video capability.  We continued and completed visit with audio only.   I discussed the limitations, risks, security and privacy concerns of performing an evaluation and management service by  virtual telehealth platform and the availability of in person appointments. I also discussed with the patient that there may be a patient responsible charge related to this service. The patient expressed understanding and agreed to proceed.  Patient location: Home Provider Location: [place] [facility] Participants: Eliezer Lofts and Laural Roes   Chief Complaint  Patient presents with  . Dysuria    seen Dr. Glori Bickers on 11/29/2018 for UTI. Culture negative so Dr. Glori Bickers stopped antibiotics.  Symptoms came back and patient restarted keflex-has 3 tablets left    History of Present Illness: 85 year old female patient with history of atrophic vaginitis on estrace cream present to discuss continued symptoms despite negative Urine culture.   Saw Dr. Glori Bickers on 11/29/2018 with  ? Blood on toilet tissue  UA showed 2 plus LE 10 ery/ul   Has no urinary symptoms at the time.  Vaginal exam  Was started on keflex but this was stopped when urine culture returned negative.   She noted in last recurrence of 2  More episodes of smear of blood ( a drop) in underware and toliet tissue in last 3 days. She notes the blood drop when wipe urethral area.  Had some burning with urination this time.  She has also  noted itching started in last 3 days, no discharge. No urgency or frequency. She has been out of town, may have been drinking less water.  Was not sure what to do so started back to 3 days of keflex ( has enough for 4 days total). since starting back on antibiotics she has noted improvement of dysuria.  Feels urinary pressure.   no fever.  COVID 19 screen No recent travel or known exposure to COVID19 The patient denies respiratory symptoms of COVID 19 at this time.  The importance of social distancing was discussed today.   Review of Systems  Constitutional: Negative for chills and fever.  HENT: Negative for congestion and ear pain.   Eyes: Negative for pain and redness.  Respiratory: Negative for cough and shortness of breath.   Cardiovascular: Negative for chest pain, palpitations and leg swelling.  Gastrointestinal: Negative for abdominal pain, blood in stool, constipation, diarrhea, nausea and vomiting.  Genitourinary: Positive for dysuria, frequency and urgency.  Musculoskeletal: Negative for falls and myalgias.  Skin: Negative for rash.  Neurological: Negative for dizziness.  Psychiatric/Behavioral: Negative for depression. The patient is not nervous/anxious.       Past Medical History:  Diagnosis Date  . Allergy   . Asthma    years ago, 2-3 times a year pt experiences a problem with breathing.  . Bursitis, hip   . Family history of adverse reaction to anesthesia    DAUGHTER had a reaction to ansethesia  . Hyperlipidemia   . Hypertension   . HYPERTENSION, BENIGN  10/26/2006   Qualifier: Diagnosis of  By: Selinda Orion    . MITRAL VALVE PROLAPSE 02/25/2007   Qualifier: History of  By: Marcelino Scot CMA, [place] Bilberry    . Osteopenia 08/01  . Osteoporosis   . Skin cancer 2015   nose    reports that she has never smoked. She has never used smokeless tobacco. She reports that she does not drink alcohol or use drugs.   Current Outpatient Medications:  .  Alum Hydroxide-Mag  Carbonate (GAVISCON EXTRA STRENGTH) 160-105 MG CHEW, Chew 1 tablet by mouth daily as needed (for heartburn). , Disp: , Rfl:  .  atenolol (TENORMIN) 50 MG tablet, Take 1 tablet (50 mg total) by mouth daily., Disp: 30 tablet, Rfl: 11 .  AYR SALINE NASAL NA, Place 1 spray into the nose daily as needed (allergies). , Disp: , Rfl:  .  cephALEXin (KEFLEX) 500 MG capsule, Take 1 capsule (500 mg total) by mouth 2 (two) times daily., Disp: 14 capsule, Rfl: 0 .  clotrimazole-betamethasone (LOTRISONE) cream, Apply 1 application topically as needed., Disp: , Rfl:  .  estradiol (ESTRACE) 0.1 MG/GM vaginal cream, Vaginal Estrogen Cream apply a blueberry sized amount to vaginal opening using finger tip 3 times weekly before bed., Disp: 42.5 g, Rfl: 6 .  fluorometholone (FML) 0.1 % ophthalmic suspension, Place 1 drop into both eyes 2 (two) times daily., Disp: , Rfl:  .  fluticasone (FLONASE) 50 MCG/ACT nasal spray, Place 2 sprays into both nostrils daily., Disp: 16 g, Rfl: 11 .  hydrocortisone (ANUSOL-HC) 25 MG suppository, Place 1 suppository (25 mg total) rectally 2 (two) times daily as needed for hemorrhoids or anal itching., Disp: 12 suppository, Rfl: 1 .  loperamide (IMODIUM) 2 MG capsule, Take 2 mg by mouth as needed for diarrhea or loose stools (has been taking with Clindamycin)., Disp: , Rfl:  .  loratadine (CLARITIN) 10 MG tablet, Take 10 mg by mouth as needed for allergies or rhinitis. , Disp: , Rfl:  .  Multiple Vitamin (MULTIVITAMIN) capsule, Take 1 capsule by mouth daily., Disp: , Rfl:  .  simvastatin (ZOCOR) 20 MG tablet, Take 1 tablet (20 mg total) by mouth daily., Disp: 30 tablet, Rfl: 11   Observations/Objective: Height 5\' 4"  (1.626 m).  Physical Exam  Physical Exam Constitutional:      General: The patient is not in acute distress. Pulmonary:     Effort: Pulmonary effort is normal. No respiratory distress.  Neurological:     Mental Status: The patient is alert and oriented to person, place,  and time.  Psychiatric:        Mood and Affect: Mood normal.        Behavior: Behavior normal.   Assessment and Plan  Given late  FRI appt and no ability to check urine again and new dysuria... consider possible new/developing UTI...will have her restart antibiotics ... complete another 3 of antibiotics  (7 days total).  When complete if still have vaginal itching.Marland KitchenMarland KitchenTake fluconazole as this is likely from postmenopausal change OR yeast from recent antibiotics.  Continue estrace cream.  Bleeding sounds most consistent with urethral caruncle. Follow up with Dr. Murrell Redden or Andreas Newport if continuing.     I discussed the assessment and treatment plan with the patient. The patient was provided an opportunity to ask questions and all were answered. The patient agreed with the plan and demonstrated an understanding of the instructions.   The patient was advised to call back or seek an in-person evaluation if  the symptoms worsen or if the condition fails to improve as anticipated.     Eliezer Lofts, MD

## 2019-01-11 DIAGNOSIS — Z85828 Personal history of other malignant neoplasm of skin: Secondary | ICD-10-CM | POA: Diagnosis not present

## 2019-01-11 DIAGNOSIS — D2271 Melanocytic nevi of right lower limb, including hip: Secondary | ICD-10-CM | POA: Diagnosis not present

## 2019-01-11 DIAGNOSIS — L400 Psoriasis vulgaris: Secondary | ICD-10-CM | POA: Diagnosis not present

## 2019-01-11 DIAGNOSIS — D225 Melanocytic nevi of trunk: Secondary | ICD-10-CM | POA: Diagnosis not present

## 2019-01-11 DIAGNOSIS — L814 Other melanin hyperpigmentation: Secondary | ICD-10-CM | POA: Diagnosis not present

## 2019-01-11 DIAGNOSIS — D1801 Hemangioma of skin and subcutaneous tissue: Secondary | ICD-10-CM | POA: Diagnosis not present

## 2019-01-11 DIAGNOSIS — L821 Other seborrheic keratosis: Secondary | ICD-10-CM | POA: Diagnosis not present

## 2019-01-11 DIAGNOSIS — L820 Inflamed seborrheic keratosis: Secondary | ICD-10-CM | POA: Diagnosis not present

## 2019-01-31 ENCOUNTER — Ambulatory Visit
Admission: RE | Admit: 2019-01-31 | Discharge: 2019-01-31 | Disposition: A | Discharge status: Home / Self Care | Payer: Medicare HMO | Source: Ambulatory Visit | Attending: Urology | Admitting: Urology

## 2019-01-31 ENCOUNTER — Other Ambulatory Visit: Payer: Self-pay

## 2019-01-31 DIAGNOSIS — K760 Fatty (change of) liver, not elsewhere classified: Secondary | ICD-10-CM | POA: Insufficient documentation

## 2019-01-31 DIAGNOSIS — D179 Benign lipomatous neoplasm, unspecified: Secondary | ICD-10-CM

## 2019-01-31 DIAGNOSIS — D3001 Benign neoplasm of right kidney: Secondary | ICD-10-CM | POA: Diagnosis not present

## 2019-01-31 DIAGNOSIS — D1771 Benign lipomatous neoplasm of kidney: Secondary | ICD-10-CM | POA: Insufficient documentation

## 2019-02-03 NOTE — Progress Notes (Signed)
02/04/2019 10:12 AM   Paula Jimenez February 24, 1927 [id number]  Referring provider: Abner Greenspan, MD [address] [place],  [place] [address]  Chief Complaint  Patient presents with  . Follow-up    HPI: Patient is an 85 year old female who presents with a history of gross hematuria, right renal angiomyolipoma, vaginal atrophy with an urethral caruncle and a history of recurrent UTI's who presents today for follow up with her daughter, Paula Jimenez.    History of hematuria Patient completed hematuria work up in 05/2015 with CT Urogram and cystoscopy.  No malignancies were found.  No recent gross hematuria.   Right renal angiomyolipoma CT w/wo performed on 02/28/2016 noted slight interval enlargement of right renal angiomyolipoma.  RUS performed on 01/27/2017 noted 2.6 cm right lower pole angiomyolipoma, stable from 2017 CT.  RUS on 01/2018 noted angiomyolipoma of right kidney unchanged compared to prior exams.  RUS performed on 01/31/2019 revealed no significant interval change in the size of the known right renal angiomyolipoma.  No hydronephrosis or shadowing stone.  Fatty liver  Vaginal atrophy Patient is using the vaginal estrogen cream three nights weekly.  She has not experienced any rash or irritation.   She has not had any vaginal discharge.    History of recurrent UTI's Risk factors:  Age, vaginal atrophy.  She has had no documented UTI over the last year.   She is asymptomatic.        PMH: Past Medical History:  Diagnosis Date  . Allergy   . Asthma    years ago, 2-3 times a year pt experiences a problem with breathing.  . Bursitis, hip   . Family history of adverse reaction to anesthesia    DAUGHTER had a reaction to ansethesia  . Hyperlipidemia   . Hypertension   . HYPERTENSION, BENIGN 10/26/2006   Qualifier: Diagnosis of  By: Selinda Orion    . MITRAL VALVE PROLAPSE 02/25/2007   Qualifier: History of  By: Marcelino Scot CMA, [place] Bilberry    . Osteopenia 08/01  .  Osteoporosis   . Skin cancer 2015   nose    Surgical History: Past Surgical History:  Procedure Laterality Date  . ABDOMINAL HYSTERECTOMY  1971  . BREAST BIOPSY Right 06/99   fibrocystic  . CYSTOSCOPY WITH BIOPSY N/A 05/31/2015   Procedure: CYSTOSCOPY WITH BIOPSY/ fulgeration;  Surgeon: Hollice Espy, MD;  Location: [facility] ORS;  Service: Urology;  Laterality: N/A;  . DENTAL SURGERY    . EYE SURGERY Bilateral 2011   June and August of 2011  . ORIF RADIAL FRACTURE Left 11/99   arm fracture, radial no surgery  . TONSILLECTOMY      Home Medications:  Allergies as of 02/04/2019      Reactions   Clindamycin/lincomycin Swelling   Throat swelling   Codeine    Unknown per pt   Levofloxacin    Unknown per pt   Rofecoxib    Unknown per pt   Valtrex [valacyclovir Hcl]    Unknown per pt   Adhesive [tape] Rash   Ciprofloxacin Diarrhea   Nitrofurantoin Nausea Only   Penicillins Swelling   mouth swelling   Raloxifene Rash   Tramadol    dizzy      Medication List       Accurate as of February 04, 2019 10:12 AM. If you have any questions, ask your nurse or doctor.        STOP taking these medications   cephALEXin 500 MG capsule  Commonly known as: KEFLEX Stopped by: Zara Council, PA-C     TAKE these medications   atenolol 50 MG tablet Commonly known as: TENORMIN Take 1 tablet (50 mg total) by mouth daily.   AYR SALINE NASAL NA Place 1 spray into the nose daily as needed (allergies).   clotrimazole-betamethasone cream Commonly known as: LOTRISONE Apply 1 application topically as needed.   estradiol 0.1 MG/GM vaginal cream Commonly known as: ESTRACE Vaginal Estrogen Cream apply a blueberry sized amount to vaginal opening using finger tip 3 times weekly before bed.   fluorometholone 0.1 % ophthalmic suspension Commonly known as: FML Place 1 drop into both eyes 2 (two) times daily.   fluticasone 50 MCG/ACT nasal spray Commonly known as: FLONASE Place 2 sprays  into both nostrils daily.   Gaviscon Extra Strength 160-105 MG Chew Generic drug: Alum Hydroxide-Mag Carbonate Chew 1 tablet by mouth daily as needed (for heartburn).   hydrocortisone 25 MG suppository Commonly known as: ANUSOL-HC Place 1 suppository (25 mg total) rectally 2 (two) times daily as needed for hemorrhoids or anal itching.   loperamide 2 MG capsule Commonly known as: IMODIUM Take 2 mg by mouth as needed for diarrhea or loose stools (has been taking with Clindamycin).   loratadine 10 MG tablet Commonly known as: CLARITIN Take 10 mg by mouth as needed for allergies or rhinitis.   multivitamin capsule Take 1 capsule by mouth daily.   simvastatin 20 MG tablet Commonly known as: ZOCOR Take 1 tablet (20 mg total) by mouth daily.       Allergies:  Allergies  Allergen Reactions  . Clindamycin/Lincomycin Swelling    Throat swelling  . Codeine     Unknown per pt   . Levofloxacin     Unknown per pt   . Rofecoxib     Unknown per pt   . Valtrex [Valacyclovir Hcl]     Unknown per pt   . Adhesive [Tape] Rash  . Ciprofloxacin Diarrhea  . Nitrofurantoin Nausea Only  . Penicillins Swelling    mouth swelling  . Raloxifene Rash  . Tramadol     dizzy    Family History: Family History  Problem Relation Age of Onset  . Leukemia Sister   . Cancer Sister        leukemia  . Heart disease Mother   . Diabetes Mother   . Heart failure Mother   . Stroke Father   . Kidney disease Neg Hx   . Kidney cancer Neg Hx   . Bladder Cancer Neg Hx     Social History:  reports that she has never smoked. She has never used smokeless tobacco. She reports that she does not drink alcohol or use drugs.  ROS: UROLOGY Frequent Urination?: No Hard to postpone urination?: No Burning/pain with urination?: No Get up at night to urinate?: No Leakage of urine?: No Urine stream starts and stops?: No Trouble starting stream?: No Do you have to strain to urinate?: No Blood in  urine?: No Urinary tract infection?: No Sexually transmitted disease?: No Injury to kidneys or bladder?: No Painful intercourse?: No Weak stream?: No Currently pregnant?: No Vaginal bleeding?: No Last menstrual period?: n  Gastrointestinal Nausea?: No Vomiting?: No Indigestion/heartburn?: No Diarrhea?: No Constipation?: No  Constitutional Fever: No Night sweats?: No Weight loss?: No Fatigue?: No  Skin Skin rash/lesions?: No Itching?: No  Eyes Blurred vision?: No Double vision?: No  Ears/Nose/Throat Sore throat?: No Sinus problems?: No  Hematologic/Lymphatic Swollen glands?: No Easy bruising?:  No  Cardiovascular Leg swelling?: No Chest pain?: No  Respiratory Cough?: No Shortness of breath?: No  Endocrine Excessive thirst?: No  Musculoskeletal Back pain?: No Joint pain?: No  Neurological Headaches?: No Dizziness?: No  Psychologic Depression?: No Anxiety?: No  Physical Exam: BP (!) 167/80 (BP Location: Left Arm, Patient Position: Sitting, Cuff Size: Normal)   Pulse 76   Ht 5\' 4"  (1.626 m)   Wt 116 lb (52.6 kg)   BMI 19.91 kg/m   Constitutional:  Well nourished. Alert and oriented, No acute distress. HEENT: [place] AT, moist mucus membranes.  Trachea midline, no masses. Cardiovascular: No clubbing, cyanosis, or edema. Respiratory: Normal respiratory effort, no increased work of breathing. GI: Abdomen is soft, non tender, non distended, no abdominal masses. Liver and spleen not palpable.  No hernias appreciated.  Stool sample for occult testing is not indicated.   GU: No CVA tenderness.  No bladder fullness or masses.  Atrophic external genitalia, sparse pubic hair distribution, no lesions.  Urethral caruncle is noted.  No urethral masses, tenderness and/or tenderness. No bladder fullness, tenderness or masses. pale vagina mucosa, fair estrogen effect, no discharge, no lesions, poor pelvic support, grade II cystocele and no rectocele noted.  Cervix and  uterus are surgically absent.  Adnexal could not be palpated.  Anus and perineum are without rashes or lesions.    Skin: No rashes, bruises or suspicious lesions. Lymph: No inguinal adenopathy. Neurologic: Grossly intact, no focal deficits, moving all 4 extremities. Psychiatric: Normal mood and affect.   Laboratory Data: Lab Results  Component Value Date   WBC 5.7 08/23/2018   HGB 14.5 08/23/2018   HCT 42.9 08/23/2018   MCV 90.9 08/23/2018   PLT 211.0 08/23/2018    Lab Results  Component Value Date   CREATININE 0.69 08/23/2018    Lab Results  Component Value Date   TSH 1.32 08/23/2018       Component Value Date/Time   CHOL 189 08/23/2018 0841   HDL 55.30 08/23/2018 0841   CHOLHDL 3 08/23/2018 0841   VLDL 27.8 08/23/2018 0841   LDLCALC 106 (H) 08/23/2018 0841    Lab Results  Component Value Date   AST 16 08/23/2018   Lab Results  Component Value Date   ALT 14 08/23/2018   I have reviewed the labs  Pertinent Imaging: CLINICAL DATA:  43-year-old female with follow-up for angiomyolipoma of the right kidney.  EXAM: RENAL / URINARY TRACT ULTRASOUND COMPLETE  COMPARISON:  Renal ultrasound dated 01/27/2018  FINDINGS: Right Kidney:  Renal measurements: 9.8 x 3.8 x 4.0 cm = volume: 78 mL. Normal echogenicity. No hydronephrosis or shadowing stone. There is a 2.1 x 2.1 x 2.1 cm (previously measuring 2.1 x 2.3 x 1.8 cm) echogenic lesion arising from the medial inferior pole of the right kidney corresponding to the known angiomyolipoma.  Left Kidney:  Renal measurements: 9.3 x 4.4 x 4.7 cm = volume: 98 mL. Echogenicity within normal limits. No mass or hydronephrosis visualized.  Bladder:  Appears normal for degree of bladder distention.  There is incidental note of a fatty liver.  IMPRESSION: 1. No significant interval change in the size of the known right renal angiomyolipoma. 2. No hydronephrosis or shadowing stone. 3. Fatty liver.    Electronically Signed   By: Anner Crete M.D.   On: 01/31/2019 20:38  I have independently reviewed the films and recognize the angiomyolipoma.   Assessment & Plan:    1. History of hematuria Completed hematuria work up in  05/2015, no findings of malignancies.  No recent hematuria.   Patient will contact the office if she experiencing gross hematuria  2. Right renal angiomyolipoma Stable RUS in one year for surveillance  3. Vaginal atrophy Continue vaginal estrogen cream RTC in one year for exam  4. Urethral caruncle   - see above  5.  History of recurrent UTI's No symptoms at this time     Return in about 1 year (around 02/04/2020) for RUS report, exam and recheck .  These notes generated with voice recognition software. I apologize for typographical errors.  Zara Council, PA-C  [facility] [address] [place], [place] [address] [phone]

## 2019-02-04 ENCOUNTER — Other Ambulatory Visit: Payer: Self-pay

## 2019-02-04 ENCOUNTER — Ambulatory Visit (INDEPENDENT_AMBULATORY_CARE_PROVIDER_SITE_OTHER): Payer: Medicare HMO | Admitting: Urology

## 2019-02-04 ENCOUNTER — Telehealth: Payer: Self-pay

## 2019-02-04 ENCOUNTER — Encounter: Payer: Self-pay | Admitting: Urology

## 2019-02-04 VITALS — BP 167/80 | HR 76 | Ht 64.0 in | Wt 116.0 lb

## 2019-02-04 DIAGNOSIS — N952 Postmenopausal atrophic vaginitis: Secondary | ICD-10-CM | POA: Diagnosis not present

## 2019-02-04 DIAGNOSIS — D179 Benign lipomatous neoplasm, unspecified: Secondary | ICD-10-CM

## 2019-02-04 DIAGNOSIS — Z87440 Personal history of urinary (tract) infections: Secondary | ICD-10-CM | POA: Diagnosis not present

## 2019-02-04 DIAGNOSIS — Z87448 Personal history of other diseases of urinary system: Secondary | ICD-10-CM

## 2019-02-04 DIAGNOSIS — N362 Urethral caruncle: Secondary | ICD-10-CM | POA: Diagnosis not present

## 2019-02-04 MED ORDER — ESTRADIOL 0.1 MG/GM VA CREA: TOPICAL_CREAM | VAGINAL | 6 refills | Status: DC

## 2019-02-04 NOTE — Telephone Encounter (Signed)
I agree with that advisement -she needs to be seen and may need fluids I will watch for records Please call and check on her on Monday thanks

## 2019-02-04 NOTE — Telephone Encounter (Signed)
Pt calling; for 2 days has had diarrhea, this morning the diarrhea is watery; pt does not have water at home due to pump issues. Pt feeling weak, dry mouth, last time she urinated was few hours ago but not urinating usual amt, urinating less urine. Pt is concerned may have e coli again. No feverk dizziness, and no blood in diarrhea. Pt said she would drive to [facility] [place]. I advised pt she should not drive due to symptoms and concern about possible dehydration also. Pt let me speak with [name] her daughter; pt declined 911 and [name] said she would take pt to [facility] in [place]. Pt does not want to go to ED. FYI to Dr Glori Bickers.

## 2019-02-05 DIAGNOSIS — R197 Diarrhea, unspecified: Secondary | ICD-10-CM | POA: Diagnosis not present

## 2019-02-07 NOTE — Telephone Encounter (Signed)
Left VM requesting pt to call us and update [place] on how she is feeling

## 2019-02-08 NOTE — Telephone Encounter (Signed)
That is re assuring  

## 2019-02-08 NOTE — Telephone Encounter (Signed)
Patient stated that she is feeling much better and feels much stronger.  Patient stated that the Diarrhea has gone and bowel movements have gone back to how they are suppose to be.  She stated that she is urinating like normal now and no dry mouth.  Patient stated that she is trying to make sure she is drinking plenty of water.

## 2019-03-02 ENCOUNTER — Ambulatory Visit (INDEPENDENT_AMBULATORY_CARE_PROVIDER_SITE_OTHER): Payer: Medicare HMO | Admitting: Family Medicine

## 2019-03-02 ENCOUNTER — Other Ambulatory Visit: Payer: Self-pay

## 2019-03-02 ENCOUNTER — Encounter: Payer: Self-pay | Admitting: Family Medicine

## 2019-03-02 VITALS — BP 132/84 | HR 75 | Temp 97.4°F | Ht 64.0 in | Wt 115.4 lb

## 2019-03-02 DIAGNOSIS — Z23 Encounter for immunization: Secondary | ICD-10-CM | POA: Diagnosis not present

## 2019-03-02 DIAGNOSIS — H9202 Otalgia, left ear: Secondary | ICD-10-CM

## 2019-03-02 DIAGNOSIS — B351 Tinea unguium: Secondary | ICD-10-CM | POA: Diagnosis not present

## 2019-03-02 NOTE — Assessment & Plan Note (Signed)
Both great toe nails are thickened and hard to trim  Also painful Has had ingrowing in the past No s/s of bact infection  Ref made to podiatry for eval/tx

## 2019-03-02 NOTE — Assessment & Plan Note (Signed)
Canals are clear-no cerumen  TMs are dull-suspect ETD inst to return to flonase ns once to twice daily (as tolerated) and alert [place] if worse or no imp

## 2019-03-02 NOTE — Patient Instructions (Addendum)
You do not have ear wax Fullness may be from middle ear fluid from sinus congestion  Use flonase every day for at least 1-2 months for allergy season  Let me know if this does not help in the next 7 to 10 days or if you develop ear pain   Our office will call you in the next 1-2 weeks to set up a foot doctor appointment for toe nail treatment   Please let me know if symptoms change or worsen  Take care of yourself   Flu shot today

## 2019-03-02 NOTE — Progress Notes (Signed)
Subjective:    Patient ID: Paula Jimenez, female    DOB: 08/12/1926, 85 y.o.   MRN: [id number]  HPI  Pt presents for ear fullness L ear feels clogged and hearing is muffled  She "washed it out" and put drops in it (for wax - debrox)  No ear pain   Tends to have dry itchy ear canals    Has hx of cerumen impaction     Toe nail problem on L foot (great toe)  Red area around nail  Nails are quite thickened and has fungal nails on both feet  Has worn hot shoes lately   Patient Active Problem List   Diagnosis Date Noted  . Onychomycosis of toenail 03/02/2019  . Urethral caruncle 12/10/2018  . Candida vaginitis 12/10/2018  . Acute cystitis 11/29/2018  . Elevated random blood glucose level 08/25/2018  . Medicare annual wellness visit, subsequent 08/25/2018  . Loose stools 06/28/2018  . Internal hemorrhoid, bleeding 04/20/2018  . Corn of toe 04/20/2018  . Ingrown nail of great toe of right foot 11/05/2017  . Trochanteric bursitis, left hip 10/08/2016  . Routine general medical examination at a health care facility 08/07/2016  . Pre-operative cardiovascular examination 05/01/2015  . Frequent UTI 02/16/2015  . Epistaxis, recurrent 02/28/2014  . History of shingles 02/20/2014  . HEARING LOSS, BILATERAL 09/23/2007  . History of melanoma 02/25/2007  . Hyperlipidemia 02/25/2007  . MITRAL VALVE PROLAPSE 02/25/2007  . ALLERGIC RHINITIS 02/25/2007  . DEGENERATIVE DISC DISEASE, LUMBOSACRAL SPINE 02/25/2007  . Age related osteoporosis 02/25/2007  . HYPERTENSION, BENIGN 10/26/2006   Past Medical History:  Diagnosis Date  . Allergy   . Asthma    years ago, 2-3 times a year pt experiences a problem with breathing.  . Bursitis, hip   . Family history of adverse reaction to anesthesia    DAUGHTER had a reaction to ansethesia  . Hyperlipidemia   . Hypertension   . HYPERTENSION, BENIGN 10/26/2006   Qualifier: Diagnosis of  By: Selinda Orion    . MITRAL VALVE PROLAPSE 02/25/2007   Qualifier: History of  By: Marcelino Scot CMA, [place] Bil[place]    . Osteopenia 08/01  . Osteoporosis   . Skin cancer 2015   nose   Past Surgical History:  Procedure Laterality Date  . ABDOMINAL HYSTERECTOMY  1971  . BREAST BIOPSY Right 06/99   fibrocystic  . CYSTOSCOPY WITH BIOPSY N/A 05/31/2015   Procedure: CYSTOSCOPY WITH BIOPSY/ fulgeration;  Surgeon: Hollice Espy, MD;  Location: [facility] ORS;  Service: Urology;  Laterality: N/A;  . DENTAL SURGERY    . EYE SURGERY Bilateral 2011   June and August of 2011  . ORIF RADIAL FRACTURE Left 11/99   arm fracture, radial no surgery  . TONSILLECTOMY     Social History   Tobacco Use  . Smoking status: Never Smoker  . Smokeless tobacco: Never Used  Substance Use Topics  . Alcohol use: No    Alcohol/week: 0.0 standard drinks  . Drug use: No   Family History  Problem Relation Age of Onset  . Leukemia Sister   . Cancer Sister        leukemia  . Heart disease Mother   . Diabetes Mother   . Heart failure Mother   . Stroke Father   . Kidney disease Neg Hx   . Kidney cancer Neg Hx   . Bladder Cancer Neg Hx    Allergies  Allergen Reactions  . Clindamycin/Lincomycin Swelling    Throat swelling  .  Codeine     Unknown per pt   . Levofloxacin     Unknown per pt   . Rofecoxib     Unknown per pt   . Valtrex [Valacyclovir Hcl]     Unknown per pt   . Adhesive [Tape] Rash  . Ciprofloxacin Diarrhea  . Nitrofurantoin Nausea Only  . Penicillins Swelling    mouth swelling  . Raloxifene Rash  . Tramadol     dizzy   Current Outpatient Medications on File Prior to Visit  Medication Sig Dispense Refill  . Alum Hydroxide-Mag Carbonate (GAVISCON EXTRA STRENGTH) 160-105 MG CHEW Chew 1 tablet by mouth daily as needed (for heartburn).     [name] atenolol (TENORMIN) 50 MG tablet Take 1 tablet (50 mg total) by mouth daily. 30 tablet 11  . AYR SALINE NASAL NA Place 1 spray into the nose daily as needed (allergies).     . clotrimazole-betamethasone  (LOTRISONE) cream Apply 1 application topically as needed.    [name] estradiol (ESTRACE) 0.1 MG/GM vaginal cream Vaginal Estrogen Cream apply a blueberry sized amount to vaginal opening using finger tip 3 times weekly before bed. 42.5 g 6  . fluorometholone (FML) 0.1 % ophthalmic suspension Place 1 drop into both eyes 2 (two) times daily.    . fluticasone (FLONASE) 50 MCG/ACT nasal spray Place 2 sprays into both nostrils daily. 16 g 11  . hydrocortisone (ANUSOL-HC) 25 MG suppository Place 1 suppository (25 mg total) rectally 2 (two) times daily as needed for hemorrhoids or anal itching. 12 suppository 1  . loperamide (IMODIUM) 2 MG capsule Take 2 mg by mouth as needed for diarrhea or loose stools (has been taking with Clindamycin).    [name] loratadine (CLARITIN) 10 MG tablet Take 10 mg by mouth as needed for allergies or rhinitis.     . Multiple Vitamin (MULTIVITAMIN) capsule Take 1 capsule by mouth daily.    . simvastatin (ZOCOR) 20 MG tablet Take 1 tablet (20 mg total) by mouth daily. 30 tablet 11   No current facility-administered medications on file prior to visit.     Review of Systems  Constitutional: Negative for activity change, appetite change, fatigue, fever and unexpected weight change.  HENT: Positive for hearing loss. Negative for congestion, ear discharge, ear pain, rhinorrhea, sinus pressure and sore throat.   Eyes: Negative for pain, redness and visual disturbance.  Respiratory: Negative for cough, shortness of breath and wheezing.   Cardiovascular: Negative for chest pain and palpitations.  Gastrointestinal: Negative for abdominal pain, blood in stool, constipation and diarrhea.  Endocrine: Negative for polydipsia and polyuria.  Genitourinary: Negative for dysuria, frequency and urgency.  Musculoskeletal: Negative for arthralgias, back pain and myalgias.  Skin: Negative for pallor and rash.       Toe nails thick /painful  Allergic/Immunologic: Negative for environmental allergies.   Neurological: Negative for dizziness, syncope and headaches.  Hematological: Negative for adenopathy. Does not bruise/bleed easily.  Psychiatric/Behavioral: Negative for decreased concentration and dysphoric mood. The patient is not nervous/anxious.        Objective:   Physical Exam Constitutional:      General: She is not in acute distress.    Appearance: Normal appearance. She is normal weight. She is not ill-appearing.  HENT:     Head: Normocephalic and atraumatic.     Right Ear: Ear canal and external ear normal. There is no impacted cerumen.     Left Ear: Ear canal and external ear normal. There is no impacted cerumen.  Ears:     Comments: TMs are dull bilaterally     Mouth/Throat:     Mouth: Mucous membranes are moist.  Neck:     Musculoskeletal: Normal range of motion.  Cardiovascular:     Rate and Rhythm: Normal rate and regular rhythm.     Heart sounds: Normal heart sounds.  Musculoskeletal:     Right lower leg: No edema.     Left lower leg: No edema.  Lymphadenopathy:     Cervical: No cervical adenopathy.  Skin:    Comments: Thickened yellow toe nails (worst with great toes)  Some mild ingrowing medially  Tender when pressure applied    Neurological:     Mental Status: She is alert.     Coordination: Coordination normal.     Gait: Gait normal.  Psychiatric:        Mood and Affect: Mood normal.           Assessment & Plan:   Problem List Items Addressed This Visit      Musculoskeletal and Integument   Onychomycosis of toenail - Primary    Both great toe nails are thickened and hard to trim  Also painful Has had ingrowing in the past No s/s of bact infection  Ref made to podiatry for eval/tx      Relevant Orders   Ambulatory referral to Podiatry     Other   Ear discomfort, left    Canals are clear-no cerumen  TMs are dull-suspect ETD inst to return to flonase ns once to twice daily (as tolerated) and alert [place] if worse or no imp        Other Visit Diagnoses    Need for influenza vaccination       Relevant Orders   Flu Vaccine QUAD High Dose(Fluad) (Completed)

## 2019-03-21 ENCOUNTER — Ambulatory Visit: Payer: Medicare HMO | Admitting: Podiatry

## 2019-03-21 ENCOUNTER — Other Ambulatory Visit: Payer: Self-pay

## 2019-03-21 ENCOUNTER — Encounter: Payer: Self-pay | Admitting: Podiatry

## 2019-03-21 DIAGNOSIS — B351 Tinea unguium: Secondary | ICD-10-CM

## 2019-03-21 DIAGNOSIS — M79676 Pain in unspecified toe(s): Secondary | ICD-10-CM

## 2019-03-22 ENCOUNTER — Encounter: Payer: Self-pay | Admitting: Podiatry

## 2019-03-22 NOTE — Progress Notes (Signed)
Complaint:  Visit Type: Patient returns to my office for continued preventative foot care services. Complaint: Patient states" my nails have grown long and thick and become painful to walk and wear shoes" Patient has not been seen in over 15 months.. The patient presents for preventative foot care services. No changes to ROS  Podiatric Exam: Vascular: dorsalis pedis and posterior tibial pulses are palpable bilateral. Capillary return is immediate. Temperature gradient is WNL. Skin turgor WNL  Sensorium: Normal Semmes Weinstein monofilament test. Normal tactile sensation bilaterally. Nail Exam: Pt has thick disfigured discolored nails with subungual debris noted bilateral entire nail hallux through fifth toenails Ulcer Exam: There is no evidence of ulcer or pre-ulcerative changes or infection. Orthopedic Exam: Muscle tone and strength are WNL. No limitations in general ROM. No crepitus or effusions noted. Foot type and digits show no abnormalities. Bony prominences are unremarkable. Skin: No Porokeratosis. No infection or ulcers  Diagnosis:  Onychomycosis, , Pain in right toe, pain in left toes  Treatment & Plan Procedures and Treatment: Consent by patient was obtained for treatment procedures.   Debridement of mycotic and hypertrophic toenails, 1 through 5 bilateral and clearing of subungual debris. No ulceration, no infection noted.  Return Visit-Office Procedure: Patient instructed to return to the office for a follow up visit 3 months for continued evaluation and treatment.    Gardiner Barefoot DPM

## 2019-03-30 ENCOUNTER — Ambulatory Visit (INDEPENDENT_AMBULATORY_CARE_PROVIDER_SITE_OTHER): Payer: Medicare HMO | Admitting: Orthopaedic Surgery

## 2019-03-30 ENCOUNTER — Encounter: Payer: Self-pay | Admitting: Orthopaedic Surgery

## 2019-03-30 DIAGNOSIS — M7062 Trochanteric bursitis, left hip: Secondary | ICD-10-CM

## 2019-03-30 MED ORDER — LIDOCAINE HCL 1 % IJ SOLN
3.0000 mL | INTRAMUSCULAR | Status: AC | PRN
  Administered 2019-03-30: 10:00:00 3 mL

## 2019-03-30 MED ORDER — METHYLPREDNISOLONE ACETATE 40 MG/ML IJ SUSP
40.0000 mg | INTRAMUSCULAR | Status: AC | PRN
  Administered 2019-03-30: 40 mg via INTRA_ARTICULAR

## 2019-03-30 NOTE — Progress Notes (Signed)
Office Visit Note   Patient: Paula Jimenez           Date of Birth: [birth year]           MRN: [id number] Visit Date: 03/30/2019              Requested by: Tower, Wynelle Fanny, MD [place],  [place] [address] PCP: Abner Greenspan, MD   Assessment & Plan: Visit Diagnoses:  1. Trochanteric bursitis, left hip     Plan:  She is shown IT band stretching exercises.  Which I had her demonstrate back to me.  She will follow-up with [place] on a as needed basis pain persist or becomes worse.  Questions encouraged and answered  Follow-Up Instructions: Return if symptoms worsen or fail to improve.   Orders:  Orders Placed This Encounter  Procedures   Large Joint Inj   No orders of the defined types were placed in this encounter.     Procedures: Large Joint Inj: L greater trochanter on 03/30/2019 10:00 AM Indications: pain Details: 22 G 1.5 in needle, lateral approach  Arthrogram: No  Medications: 3 mL lidocaine 1 %; 40 mg methylPREDNISolone acetate 40 MG/ML Outcome: tolerated well, no immediate complications Procedure, treatment alternatives, risks and benefits explained, specific risks discussed. Consent was given by the patient. Immediately prior to procedure a time out was called to verify the correct patient, procedure, equipment, support staff and site/side marked as required. Patient was prepped and draped in the usual sterile fashion.       Clinical Data: No additional findings.   Subjective: Chief Complaint  Patient presents with   Left Hip - Pain    HPI Paula Jimenez is a 85 year old female who comes in today for left hip pain.  Pain is lateral aspect of the hip and buttocks region.  No groin pain.  Pain is similar to what she had 2 years ago and had a hip injection for trochanteric bursitis.  She is been doing a lot of yard work and washing her car and actually even following some [employer] out of the [employer].  She denies any radiation of pain down either  leg.  She tried rest Tylenol which helps some. Review of Systems Negative for fevers chills shortness of breath chest pain  Objective: Vital Signs: There were no vitals taken for this visit.  Physical Exam Constitutional:      Appearance: She is normal weight. She is not ill-appearing or diaphoretic.  Pulmonary:     Effort: Pulmonary effort is normal.  Neurological:     Mental Status: She is alert and oriented to person, place, and time.  Psychiatric:        Mood and Affect: Mood normal.     Ortho Exam Bilateral hips excellent range of motion without pain.  Tenderness over the left trochanteric region no tenderness of the right trochanteric region.  Calves are supple nontender dorsiflexion plantarflexion bilateral ankles intact.  She gets on and off the exam table easily without any assistive device.  Specialty Comments:  No specialty comments available.  Imaging: No results found.   PMFS History: Patient Active Problem List   Diagnosis Date Noted   Onychomycosis of toenail 03/02/2019   Ear discomfort, left 03/02/2019   Urethral caruncle 12/10/2018   Candida vaginitis 12/10/2018   Acute cystitis 11/29/2018   Elevated random blood glucose level 08/25/2018   Medicare annual wellness visit, subsequent 08/25/2018   Loose stools 06/28/2018   Internal  hemorrhoid, bleeding 04/20/2018   Corn of toe 04/20/2018   Ingrown nail of great toe of right foot 11/05/2017   Trochanteric bursitis, left hip 10/08/2016   Routine general medical examination at a health care facility 08/07/2016   Pre-operative cardiovascular examination 05/01/2015   Frequent UTI 02/16/2015   Epistaxis, recurrent 02/28/2014   History of shingles 02/20/2014   HEARING LOSS, BILATERAL 09/23/2007   History of melanoma 02/25/2007   Hyperlipidemia 02/25/2007   MITRAL VALVE PROLAPSE 02/25/2007   ALLERGIC RHINITIS 02/25/2007   DEGENERATIVE DISC DISEASE, LUMBOSACRAL SPINE 02/25/2007   Age  related osteoporosis 02/25/2007   HYPERTENSION, BENIGN 10/26/2006   Past Medical History:  Diagnosis Date   Allergy    Asthma    years ago, 2-3 times a year pt experiences a problem with breathing.   Bursitis, hip    Family history of adverse reaction to anesthesia    DAUGHTER had a reaction to ansethesia   Hyperlipidemia    Hypertension    HYPERTENSION, BENIGN 10/26/2006   Qualifier: Diagnosis of  By: Selinda Orion     MITRAL VALVE PROLAPSE 02/25/2007   Qualifier: History of  By: Marcelino Scot CMA, [place] Bilberry     Osteopenia 08/01   Osteoporosis    Skin cancer 2015   nose    Family History  Problem Relation Age of Onset   Leukemia Sister    Cancer Sister        leukemia   Heart disease Mother    Diabetes Mother    Heart failure Mother    Stroke Father    Kidney disease Neg Hx    Kidney cancer Neg Hx    Bladder Cancer Neg Hx     Past Surgical History:  Procedure Laterality Date   ABDOMINAL HYSTERECTOMY  1971   BREAST BIOPSY Right 06/99   fibrocystic   CYSTOSCOPY WITH BIOPSY N/A 05/31/2015   Procedure: CYSTOSCOPY WITH BIOPSY/ fulgeration;  Surgeon: Hollice Espy, MD;  Location: [facility];  Service: Urology;  Laterality: N/A;   DENTAL SURGERY     EYE SURGERY Bilateral 2011   June and August of 2011   ORIF RADIAL FRACTURE Left 11/99   arm fracture, radial no surgery   TONSILLECTOMY     Social History   Occupational History   Occupation: retired  Tobacco Use   Smoking status: Never Smoker   Smokeless tobacco: Never Used  Substance and Sexual Activity   Alcohol use: No    Alcohol/week: 0.0 standard drinks   Drug use: No   Sexual activity: Never

## 2019-05-17 ENCOUNTER — Other Ambulatory Visit: Payer: Self-pay

## 2019-05-17 ENCOUNTER — Encounter: Payer: Self-pay | Admitting: Family Medicine

## 2019-05-17 ENCOUNTER — Ambulatory Visit (INDEPENDENT_AMBULATORY_CARE_PROVIDER_SITE_OTHER): Payer: Medicare HMO | Admitting: Family Medicine

## 2019-05-17 VITALS — BP 138/80 | HR 77 | Temp 97.3°F | Ht 64.0 in | Wt 117.1 lb

## 2019-05-17 DIAGNOSIS — N390 Urinary tract infection, site not specified: Secondary | ICD-10-CM

## 2019-05-17 DIAGNOSIS — R300 Dysuria: Secondary | ICD-10-CM | POA: Diagnosis not present

## 2019-05-17 DIAGNOSIS — N3001 Acute cystitis with hematuria: Secondary | ICD-10-CM | POA: Diagnosis not present

## 2019-05-17 DIAGNOSIS — I10 Essential (primary) hypertension: Secondary | ICD-10-CM | POA: Diagnosis not present

## 2019-05-17 LAB — POC URINALSYSI DIPSTICK (AUTOMATED)
Bilirubin, UA: NEGATIVE
Blood, UA: 100
Glucose, UA: NEGATIVE
Ketones, UA: NEGATIVE
Nitrite, UA: NEGATIVE
Protein, UA: POSITIVE — AB
Spec Grav, UA: 1.015 (ref 1.010–1.025)
Urobilinogen, UA: 0.2 E.U./dL
pH, UA: 7.5 (ref 5.0–8.0)

## 2019-05-17 MED ORDER — CEPHALEXIN 500 MG PO CAPS: 500.0000 mg | ORAL_CAPSULE | Freq: Two times a day (BID) | ORAL | 0 refills | Status: DC

## 2019-05-17 NOTE — Assessment & Plan Note (Signed)
Mildly pos ua with frequency and urgency  No gross hematuria cx pending  Px keflex (she can take this despite pcn allergy) Encouraged fluid intake Handout given  inst to call if symptoms worsen Will call with cx report

## 2019-05-17 NOTE — Progress Notes (Signed)
Subjective:    Patient ID: Paula Jimenez, female    DOB: [birth year], 85 y.o.   MRN: [id number]   This visit occurred during the SARS-CoV-2 public health emergency.  Safety protocols were in place, including screening questions prior to the visit, additional usage of staff PPE, and extensive cleaning of exam room while observing appropriate contact time as indicated for disinfecting solutions.    HPI Here for urinary symptoms   Dysuria and frequency Started on 11/19   Discomfort to urinate  Frequency  Urgency -  Sometimes bladder is full and other times not  No blood in urine  No fever  No nausea /does not feel sick  No flank pain but has chronic low back pain   H/o frequent utis Has seen urology  Last cx here was in June and it was negative   Recently had diarrhea - she may have contaminated herself  (had a slip and fall in the shower during that)- no injuries  Was from something she ate   Drinking lots of fluids    ua - pos  Results for orders placed or performed in visit on 05/17/19  POCT Urinalysis Dipstick (Automated)  Result Value Ref Range   Color, UA Yellow    Clarity, UA Cloudy    Glucose, UA Negative Negative   Bilirubin, UA Negative    Ketones, UA Negative    Spec Grav, UA 1.015 1.010 - 1.025   Blood, UA 100 Ery/uL    pH, UA 7.5 5.0 - 8.0   Protein, UA Positive (A) Negative   Urobilinogen, UA 0.2 0.2 or 1.0 E.U./dL   Nitrite, UA Negative    Leukocytes, UA Small (1+) (A) Negative    Allergic to pcn but can take keflex   Lab Results  Component Value Date   CREATININE 0.69 08/23/2018   BUN 13 08/23/2018   NA 138 08/23/2018   K 4.7 08/23/2018   CL 99 08/23/2018   CO2 32 08/23/2018     bp is up on first check today BP Readings from Last 3 Encounters:  05/17/19 (!) 160/80  03/02/19 132/84  02/04/19 (!) 167/80   Better on 2nd check  BP: 138/80    Patient Active Problem List   Diagnosis Date Noted   Onychomycosis of toenail 03/02/2019     Ear discomfort, left 03/02/2019   Urethral caruncle 12/10/2018   Candida vaginitis 12/10/2018   Acute cystitis 11/29/2018   Elevated random blood glucose level 08/25/2018   Medicare annual wellness visit, subsequent 08/25/2018   Loose stools 06/28/2018   Internal hemorrhoid, bleeding 04/20/2018   Corn of toe 04/20/2018   Ingrown nail of great toe of right foot 11/05/2017   Trochanteric bursitis, left hip 10/08/2016   Routine general medical examination at a health care facility 08/07/2016   Pre-operative cardiovascular examination 05/01/2015   Frequent UTI 02/16/2015   Epistaxis, recurrent 02/28/2014   History of shingles 02/20/2014   HEARING LOSS, BILATERAL 09/23/2007   History of melanoma 02/25/2007   Hyperlipidemia 02/25/2007   MITRAL VALVE PROLAPSE 02/25/2007   ALLERGIC RHINITIS 02/25/2007   DEGENERATIVE DISC DISEASE, LUMBOSACRAL SPINE 02/25/2007   Age related osteoporosis 02/25/2007   HYPERTENSION, BENIGN 10/26/2006   Past Medical History:  Diagnosis Date   Allergy    Asthma    years ago, 2-3 times a year pt experiences a problem with breathing.   Bursitis, hip    Family history of adverse reaction to anesthesia    DAUGHTER had a  reaction to ansethesia   Hyperlipidemia    Hypertension    HYPERTENSION, BENIGN 10/26/2006   Qualifier: Diagnosis of  By: Selinda Orion     MITRAL VALVE PROLAPSE 02/25/2007   Qualifier: History of  By: Marcelino Scot CMA, [place] Bilberry     Osteopenia 08/01   Osteoporosis    Skin cancer 2015   nose   Past Surgical History:  Procedure Laterality Date   ABDOMINAL HYSTERECTOMY  1971   BREAST BIOPSY Right 06/99   fibrocystic   CYSTOSCOPY WITH BIOPSY N/A 05/31/2015   Procedure: CYSTOSCOPY WITH BIOPSY/ fulgeration;  Surgeon: Hollice Espy, MD;  Location: [facility];  Service: Urology;  Laterality: N/A;   DENTAL SURGERY     EYE SURGERY Bilateral 2011   June and August of 2011   ORIF RADIAL FRACTURE Left 11/99    arm fracture, radial no surgery   TONSILLECTOMY     Social History   Tobacco Use   Smoking status: Never Smoker   Smokeless tobacco: Never Used  Substance Use Topics   Alcohol use: No    Alcohol/week: 0.0 standard drinks   Drug use: No   Family History  Problem Relation Age of Onset   Leukemia Sister    Cancer Sister        leukemia   Heart disease Mother    Diabetes Mother    Heart failure Mother    Stroke Father    Kidney disease Neg Hx    Kidney cancer Neg Hx    Bladder Cancer Neg Hx    Allergies  Allergen Reactions   Clindamycin/Lincomycin Swelling    Throat swelling   Codeine     Unknown per pt    Levofloxacin     Unknown per pt    Rofecoxib     Unknown per pt    Valtrex [Valacyclovir Hcl]     Unknown per pt    Adhesive [Tape] Rash   Ciprofloxacin Diarrhea   Nitrofurantoin Nausea Only   Penicillins Swelling    mouth swelling   Raloxifene Rash   Tramadol     dizzy   Current Outpatient Medications on File Prior to Visit  Medication Sig Dispense Refill   Alum Hydroxide-Mag Carbonate (GAVISCON EXTRA STRENGTH) 160-105 MG CHEW Chew 1 tablet by mouth daily as needed (for heartburn).      atenolol (TENORMIN) 50 MG tablet Take 1 tablet (50 mg total) by mouth daily. 30 tablet 11   AYR SALINE NASAL NA Place 1 spray into the nose daily as needed (allergies).      clotrimazole-betamethasone (LOTRISONE) cream Apply 1 application topically as needed.     estradiol (ESTRACE) 0.1 MG/GM vaginal cream Vaginal Estrogen Cream apply a blueberry sized amount to vaginal opening using finger tip 3 times weekly before bed. 42.5 g 6   fluorometholone (FML) 0.1 % ophthalmic suspension Place 1 drop into both eyes 2 (two) times daily.     fluticasone (FLONASE) 50 MCG/ACT nasal spray Place 2 sprays into both nostrils daily. 16 g 11   hydrocortisone (ANUSOL-HC) 25 MG suppository Place 1 suppository (25 mg total) rectally 2 (two) times daily as needed  for hemorrhoids or anal itching. 12 suppository 1   loperamide (IMODIUM) 2 MG capsule Take 2 mg by mouth as needed for diarrhea or loose stools (has been taking with Clindamycin).     loratadine (CLARITIN) 10 MG tablet Take 10 mg by mouth as needed for allergies or rhinitis.      Multiple Vitamin (MULTIVITAMIN)  capsule Take 1 capsule by mouth daily.     simvastatin (ZOCOR) 20 MG tablet Take 1 tablet (20 mg total) by mouth daily. 30 tablet 11   No current facility-administered medications on file prior to visit.       Review of Systems  Constitutional: Positive for fatigue. Negative for activity change, appetite change and fever.  HENT: Negative for congestion and sore throat.   Eyes: Negative for itching and visual disturbance.  Respiratory: Negative for cough and shortness of breath.   Cardiovascular: Negative for leg swelling.  Gastrointestinal: Negative for abdominal distention, abdominal pain, constipation, diarrhea and nausea.  Endocrine: Negative for cold intolerance and polydipsia.  Genitourinary: Positive for dysuria, frequency and urgency. Negative for difficulty urinating, flank pain and hematuria.  Musculoskeletal: Negative for myalgias.  Skin: Negative for rash.  Allergic/Immunologic: Negative for immunocompromised state.  Neurological: Negative for dizziness and weakness.  Hematological: Negative for adenopathy.       Objective:   Physical Exam Constitutional:      General: She is not in acute distress.    Appearance: Normal appearance. She is well-developed and normal weight.  HENT:     Head: Normocephalic and atraumatic.     Right Ear: External ear normal.     Left Ear: External ear normal.     Nose: Nose normal.  Eyes:     General: No scleral icterus.       Right eye: No discharge.        Left eye: No discharge.     Conjunctiva/sclera: Conjunctivae normal.     Pupils: Pupils are equal, round, and reactive to light.  Neck:     Musculoskeletal: Normal  range of motion and neck supple.     Thyroid: No thyromegaly.     Vascular: No carotid bruit or JVD.  Cardiovascular:     Rate and Rhythm: Normal rate and regular rhythm.     Heart sounds: Normal heart sounds. No gallop.   Pulmonary:     Effort: Pulmonary effort is normal. No respiratory distress.     Breath sounds: Normal breath sounds. No wheezing or rales.  Abdominal:     General: Bowel sounds are normal. There is no distension.     Palpations: Abdomen is soft. There is no mass.     Tenderness: There is abdominal tenderness. There is no rebound.     Comments: No cva tenderness  Mild suprapubic tenderness if any  No fullness   Musculoskeletal:        General: No tenderness.  Lymphadenopathy:     Cervical: No cervical adenopathy.  Skin:    General: Skin is warm and dry.     Coloration: Skin is not pale.     Findings: No erythema or rash.  Neurological:     Mental Status: She is alert.     Cranial Nerves: No cranial nerve deficit.     Motor: No abnormal muscle tone.     Coordination: Coordination normal.     Deep Tendon Reflexes: Reflexes are normal and symmetric.  Psychiatric:        Mood and Affect: Mood normal.           Assessment & Plan:   Problem List Items Addressed This Visit      Cardiovascular and Mediastinum   HYPERTENSION, BENIGN     Genitourinary   Frequent UTI   Relevant Medications   cephALEXin (KEFLEX) 500 MG capsule   Other Relevant Orders   Urine Culture  Acute cystitis    Mildly pos ua with frequency and urgency  No gross hematuria cx pending  Px keflex (she can take this despite pcn allergy) Encouraged fluid intake Handout given  inst to call if symptoms worsen Will call with cx report      Relevant Orders   Urine Culture    Other Visit Diagnoses    Dysuria    -  Primary   Relevant Orders   POCT Urinalysis Dipstick (Automated) (Completed)

## 2019-05-17 NOTE — Patient Instructions (Signed)
Take keflex as directed  Drink lots of water   We will call you when the urine culture comes back   If symptoms suddenly worsen let us know

## 2019-05-19 LAB — URINE CULTURE
MICRO NUMBER:: 1134698
SPECIMEN QUALITY:: ADEQUATE

## 2019-05-30 ENCOUNTER — Encounter: Payer: Self-pay | Admitting: Family Medicine

## 2019-05-30 ENCOUNTER — Other Ambulatory Visit: Payer: Self-pay

## 2019-05-30 ENCOUNTER — Ambulatory Visit (INDEPENDENT_AMBULATORY_CARE_PROVIDER_SITE_OTHER): Payer: Medicare HMO | Admitting: Family Medicine

## 2019-05-30 VITALS — BP 144/78 | HR 81 | Temp 97.9°F | Ht 64.0 in | Wt 116.4 lb

## 2019-05-30 DIAGNOSIS — R195 Other fecal abnormalities: Secondary | ICD-10-CM | POA: Diagnosis not present

## 2019-05-30 DIAGNOSIS — R5382 Chronic fatigue, unspecified: Secondary | ICD-10-CM | POA: Diagnosis not present

## 2019-05-30 DIAGNOSIS — N390 Urinary tract infection, site not specified: Secondary | ICD-10-CM

## 2019-05-30 DIAGNOSIS — R8290 Unspecified abnormal findings in urine: Secondary | ICD-10-CM

## 2019-05-30 DIAGNOSIS — R5383 Other fatigue: Secondary | ICD-10-CM | POA: Insufficient documentation

## 2019-05-30 LAB — POC URINALSYSI DIPSTICK (AUTOMATED)
Bilirubin, UA: NEGATIVE
Blood, UA: 25
Glucose, UA: NEGATIVE
Ketones, UA: NEGATIVE
Nitrite, UA: NEGATIVE
Protein, UA: NEGATIVE
Spec Grav, UA: 1.015 (ref 1.010–1.025)
Urobilinogen, UA: 0.2 E.U./dL
pH, UA: 6 (ref 5.0–8.0)

## 2019-05-30 NOTE — Patient Instructions (Addendum)
Let's re check a urine sample today- we will process a urine sample   Let's check a blood test for cbc and electrolytes and sugar   Drink fluids- this is most important to resolve any dehydration from the diarrhea you had  Try to eat some small bland meals (crackers Paula Jimenez /rice/toast) until appetite improves   I'm glad the diarrhea is better

## 2019-05-30 NOTE — Assessment & Plan Note (Signed)
Pt clinically improved after uti recently tx with keflex (did get diarrhea after finishing abx) Still feeling fairly tired/weak Re check ua today

## 2019-05-30 NOTE — Assessment & Plan Note (Signed)
Pt has felt generally fatigued/weak since recent uti tx with keflex followed by diarrhea   Both urinary symptoms and diarrhea resolved but no appetite  Enc fluid intake (disc s/s of dehydration) Also BRAT diet as tolerated Lab for cmet and cbc today

## 2019-05-30 NOTE — Assessment & Plan Note (Signed)
Pt had more severe diarrhea last week following abx and eating vienna sausages This is now resolved/back to baseline Does still have low appetite/feels weak Enc fluids/rest Lab today

## 2019-05-30 NOTE — Progress Notes (Signed)
Subjective:    Patient ID: Paula Jimenez, female    DOB: 07-20-1926, 85 y.o.   MRN: [id number]  HPI Pt presents with diarrhea (last week) followed by generalized fatigue  Unsure if from abx or not   Wt Readings from Last 3 Encounters:  05/30/19 116 lb 7 oz (52.8 kg)  05/17/19 117 lb 2 oz (53.1 kg)  03/02/19 115 lb 6 oz (52.3 kg)   19.99 kg/m   Was tx for uti on 11/24 tx with keflex -finished abx  Urine culture grew out pan sensitive e coli   Pt has h/o chronic loose stools  This is much worse than that  Was incontinent of stool    She developed diarrhea the day after she finished the abx  Ate vienna sausage  (which she does not usually eat)  No blood in stool  (occ hemorrhoids)   Improving now- back to her baseline  Still no appetite now  Feels weak in general   She is making herself drink   No n/v Has had some abd bloating and discomfort   Thinks she pulled a muscle in R groin after doing some exercises at home  BP Readings from Last 3 Encounters:  05/30/19 (!) 144/78  05/17/19 138/80  03/02/19 132/84   Pulse Readings from Last 3 Encounters:  05/30/19 81  05/17/19 77  03/02/19 75    No covid contacts that she knows of  Generally stays in   No more cough than usual  No fever or chills or aches   No uti symptoms right now at all  Is just tired/weak   ua today: Results for orders placed or performed in visit on 05/30/19  POCT Urinalysis Dipstick (Automated)  Result Value Ref Range   Color, UA Yellow    Clarity, UA Clear    Glucose, UA Negative Negative   Bilirubin, UA Negative    Ketones, UA Negative    Spec Grav, UA 1.015 1.010 - 1.025   Blood, UA 25 Ery/uL    pH, UA 6.0 5.0 - 8.0   Protein, UA Negative Negative   Urobilinogen, UA 0.2 0.2 or 1.0 E.U./dL   Nitrite, UA Negative    Leukocytes, UA Small (1+) (A) Negative   urine culture is pending    Patient Active Problem List   Diagnosis Date Noted  . Fatigue 05/30/2019  .  Onychomycosis of toenail 03/02/2019  . Ear discomfort, left 03/02/2019  . Urethral caruncle 12/10/2018  . Candida vaginitis 12/10/2018  . Acute cystitis 11/29/2018  . Elevated random blood glucose level 08/25/2018  . Medicare annual wellness visit, subsequent 08/25/2018  . Loose stools 06/28/2018  . Internal hemorrhoid, bleeding 04/20/2018  . Corn of toe 04/20/2018  . Ingrown nail of great toe of right foot 11/05/2017  . Trochanteric bursitis, left hip 10/08/2016  . Routine general medical examination at a health care facility 08/07/2016  . Pre-operative cardiovascular examination 05/01/2015  . Frequent UTI 02/16/2015  . Epistaxis, recurrent 02/28/2014  . History of shingles 02/20/2014  . HEARING LOSS, BILATERAL 09/23/2007  . History of melanoma 02/25/2007  . Hyperlipidemia 02/25/2007  . MITRAL VALVE PROLAPSE 02/25/2007  . ALLERGIC RHINITIS 02/25/2007  . DEGENERATIVE DISC DISEASE, LUMBOSACRAL SPINE 02/25/2007  . Age related osteoporosis 02/25/2007  . HYPERTENSION, BENIGN 10/26/2006   Past Medical History:  Diagnosis Date  . Allergy   . Asthma    years ago, 2-3 times a year pt experiences a problem with breathing.  . Bursitis, hip   .  Family history of adverse reaction to anesthesia    DAUGHTER had a reaction to ansethesia  . Hyperlipidemia   . Hypertension   . HYPERTENSION, BENIGN 10/26/2006   Qualifier: Diagnosis of  By: Selinda Orion    . MITRAL VALVE PROLAPSE 02/25/2007   Qualifier: History of  By: Marcelino Scot CMA, [place]    . Osteopenia 08/01  . Osteoporosis   . Skin cancer 2015   nose   Past Surgical History:  Procedure Laterality Date  . ABDOMINAL HYSTERECTOMY  1971  . BREAST BIOPSY Right 06/99   fibrocystic  . CYSTOSCOPY WITH BIOPSY N/A 05/31/2015   Procedure: CYSTOSCOPY WITH BIOPSY/ fulgeration;  Surgeon: Hollice Espy, MD;  Location: [facility];  Service: Urology;  Laterality: N/A;  . DENTAL SURGERY    . EYE SURGERY Bilateral 2011   June and August of 2011   . ORIF RADIAL FRACTURE Left 11/99   arm fracture, radial no surgery  . TONSILLECTOMY     Social History   Tobacco Use  . Smoking status: Never Smoker  . Smokeless tobacco: Never Used  Substance Use Topics  . Alcohol use: No    Alcohol/week: 0.0 standard drinks  . Drug use: No   Family History  Problem Relation Age of Onset  . Leukemia Sister   . Cancer Sister        leukemia  . Heart disease Mother   . Diabetes Mother   . Heart failure Mother   . Stroke Father   . Kidney disease Neg Hx   . Kidney cancer Neg Hx   . Bladder Cancer Neg Hx    Allergies  Allergen Reactions  . Clindamycin/Lincomycin Swelling    Throat swelling  . Codeine     Unknown per pt   . Levofloxacin     Unknown per pt   . Rofecoxib     Unknown per pt   . Valtrex [Valacyclovir Hcl]     Unknown per pt   . Adhesive [Tape] Rash  . Ciprofloxacin Diarrhea  . Nitrofurantoin Nausea Only  . Penicillins Swelling    mouth swelling  . Raloxifene Rash  . Tramadol     dizzy   Current Outpatient Medications on File Prior to Visit  Medication Sig Dispense Refill  . Alum Hydroxide-Mag Carbonate (GAVISCON EXTRA STRENGTH) 160-105 MG CHEW Chew 1 tablet by mouth daily as needed (for heartburn).     [name] atenolol (TENORMIN) 50 MG tablet Take 1 tablet (50 mg total) by mouth daily. 30 tablet 11  . AYR SALINE NASAL NA Place 1 spray into the nose daily as needed (allergies).     . clotrimazole-betamethasone (LOTRISONE) cream Apply 1 application topically as needed.    [name] estradiol (ESTRACE) 0.1 MG/GM vaginal cream Vaginal Estrogen Cream apply a blueberry sized amount to vaginal opening using finger tip 3 times weekly before bed. 42.5 g 6  . fluorometholone (FML) 0.1 % ophthalmic suspension Place 1 drop into both eyes 2 (two) times daily.    . fluticasone (FLONASE) 50 MCG/ACT nasal spray Place 2 sprays into both nostrils daily. 16 g 11  . hydrocortisone (ANUSOL-HC) 25 MG suppository Place 1 suppository (25 mg total)  rectally 2 (two) times daily as needed for hemorrhoids or anal itching. 12 suppository 1  . loperamide (IMODIUM) 2 MG capsule Take 2 mg by mouth as needed for diarrhea or loose stools (has been taking with Clindamycin).    [name] loratadine (CLARITIN) 10 MG tablet Take 10 mg by mouth as  needed for allergies or rhinitis.     . Multiple Vitamin (MULTIVITAMIN) capsule Take 1 capsule by mouth daily.    . simvastatin (ZOCOR) 20 MG tablet Take 1 tablet (20 mg total) by mouth daily. 30 tablet 11   No current facility-administered medications on file prior to visit.     Review of Systems  Constitutional: Positive for fatigue. Negative for activity change, appetite change, fever and unexpected weight change.  HENT: Negative for congestion, ear pain, rhinorrhea, sinus pressure and sore throat.   Eyes: Negative for pain, redness and visual disturbance.  Respiratory: Negative for cough, shortness of breath and wheezing.   Cardiovascular: Negative for chest pain and palpitations.  Gastrointestinal: Negative for abdominal distention, abdominal pain, anal bleeding, blood in stool, constipation, diarrhea, nausea, rectal pain and vomiting.       Diarrhea is now resolved Back to baseline soft stools  Endocrine: Negative for polydipsia and polyuria.  Genitourinary: Negative for dysuria, frequency and urgency.  Musculoskeletal: Negative for arthralgias, back pain and myalgias.  Skin: Negative for pallor and rash.  Allergic/Immunologic: Negative for environmental allergies.  Neurological: Negative for dizziness, syncope and headaches.       No focal weakness/ just feels generally weak/tired  Hematological: Negative for adenopathy. Does not bruise/bleed easily.  Psychiatric/Behavioral: Negative for decreased concentration and dysphoric mood. The patient is not nervous/anxious.        Feels nervous/jittery       Objective:   Physical Exam Constitutional:      General: She is not in acute distress.     Appearance: Normal appearance. She is well-developed and normal weight. She is not ill-appearing or diaphoretic.  HENT:     Head: Normocephalic and atraumatic.     Mouth/Throat:     Mouth: Mucous membranes are moist.  Eyes:     Conjunctiva/sclera: Conjunctivae normal.     Pupils: Pupils are equal, round, and reactive to light.  Neck:     Musculoskeletal: Normal range of motion and neck supple.     Thyroid: No thyromegaly.     Vascular: No carotid bruit or JVD.  Cardiovascular:     Rate and Rhythm: Normal rate and regular rhythm.     Heart sounds: Normal heart sounds. No gallop.   Pulmonary:     Effort: Pulmonary effort is normal. No respiratory distress.     Breath sounds: Normal breath sounds. No wheezing or rales.  Abdominal:     General: Bowel sounds are normal. There is no distension or abdominal bruit.     Palpations: Abdomen is soft. There is no mass.     Tenderness: There is no abdominal tenderness. There is no right CVA tenderness, left CVA tenderness, guarding or rebound.     Hernia: No hernia is present.  Musculoskeletal:     Right lower leg: No edema.     Left lower leg: No edema.  Lymphadenopathy:     Cervical: No cervical adenopathy.  Skin:    General: Skin is warm and dry.     Findings: No rash.  Neurological:     Mental Status: She is alert. Mental status is at baseline.     Cranial Nerves: No cranial nerve deficit.     Deep Tendon Reflexes: Reflexes are normal and symmetric.  Psychiatric:     Comments: Mildly anxious but mentally clear/talkative and pleasant           Assessment & Plan:   Problem List Items Addressed This Visit  Genitourinary   Frequent UTI    Pt clinically improved after uti recently tx with keflex (did get diarrhea after finishing abx) Still feeling fairly tired/weak Re check ua today        Other   Loose stools    Pt had more severe diarrhea last week following abx and eating vienna sausages This is now resolved/back  to baseline Does still have low appetite/feels weak Enc fluids/rest Lab today      Relevant Orders   CBC w/Diff   Comprehensive metabolic panel   Fatigue - Primary    Pt has felt generally fatigued/weak since recent uti tx with keflex followed by diarrhea   Both urinary symptoms and diarrhea resolved but no appetite  Enc fluid intake (disc s/s of dehydration) Also BRAT diet as tolerated Lab for cmet and cbc today        Relevant Orders   CBC w/Diff   Comprehensive metabolic panel

## 2019-05-31 LAB — COMPREHENSIVE METABOLIC PANEL
ALT: 15 U/L (ref 0–35)
AST: 19 U/L (ref 0–37)
Albumin: 3.6 g/dL (ref 3.5–5.2)
Alkaline Phosphatase: 67 U/L (ref 39–117)
BUN: 10 mg/dL (ref 6–23)
CO2: 33 mEq/L — ABNORMAL HIGH (ref 19–32)
Calcium: 8.9 mg/dL (ref 8.4–10.5)
Chloride: 95 mEq/L — ABNORMAL LOW (ref 96–112)
Creatinine, Ser: 0.6 mg/dL (ref 0.40–1.20)
GFR: 93.79 mL/min (ref >60.00)
Glucose, Bld: 127 mg/dL — ABNORMAL HIGH (ref 70–99)
Potassium: 3.9 mEq/L (ref 3.5–5.1)
Sodium: 135 mEq/L (ref 135–145)
Total Bilirubin: 0.5 mg/dL (ref 0.2–1.2)
Total Protein: 6.4 g/dL (ref 6.0–8.3)

## 2019-05-31 LAB — CBC WITH DIFFERENTIAL/PLATELET
Basophils Absolute: 0.1 10*3/uL (ref 0.0–0.1)
Basophils Relative: 1.1 % (ref 0.0–3.0)
Eosinophils Absolute: 0 10*3/uL (ref 0.0–0.7)
Eosinophils Relative: 0.7 % (ref 0.0–5.0)
HCT: 38.2 % (ref 36.0–46.0)
Hemoglobin: 13 g/dL (ref 12.0–15.0)
Lymphocytes Relative: 20.5 % (ref 12.0–46.0)
Lymphs Abs: 1.3 10*3/uL (ref 0.7–4.0)
MCHC: 34.1 g/dL (ref 30.0–36.0)
MCV: 91.3 fl (ref 78.0–100.0)
Monocytes Absolute: 0.7 10*3/uL (ref 0.1–1.0)
Monocytes Relative: 11.2 % (ref 3.0–12.0)
Neutro Abs: 4.4 10*3/uL (ref 1.4–7.7)
Neutrophils Relative %: 66.5 % (ref 43.0–77.0)
Platelets: 199 10*3/uL (ref 150.0–400.0)
RBC: 4.19 Mil/uL (ref 3.87–5.11)
RDW: 13.8 % (ref 11.5–15.5)
WBC: 6.6 10*3/uL (ref 4.0–10.5)

## 2019-05-31 LAB — URINE CULTURE
MICRO NUMBER:: 1170586
Result:: NO GROWTH
SPECIMEN QUALITY:: ADEQUATE

## 2019-07-21 ENCOUNTER — Ambulatory Visit: Payer: Medicare HMO | Admitting: Podiatry

## 2019-08-08 ENCOUNTER — Ambulatory Visit: Payer: Medicare HMO | Admitting: Podiatry

## 2019-08-10 ENCOUNTER — Other Ambulatory Visit: Payer: Self-pay | Admitting: Family Medicine

## 2019-08-29 ENCOUNTER — Ambulatory Visit: Payer: Medicare HMO | Admitting: Podiatry

## 2019-09-12 ENCOUNTER — Encounter: Payer: Self-pay | Admitting: Podiatry

## 2019-09-12 ENCOUNTER — Other Ambulatory Visit: Payer: Self-pay

## 2019-09-12 ENCOUNTER — Ambulatory Visit: Payer: Medicare HMO | Admitting: Podiatry

## 2019-09-12 DIAGNOSIS — M79676 Pain in unspecified toe(s): Secondary | ICD-10-CM

## 2019-09-12 DIAGNOSIS — B351 Tinea unguium: Secondary | ICD-10-CM

## 2019-09-12 NOTE — Progress Notes (Signed)
Complaint:  Visit Type: Patient returns to my office for continued preventative foot care services. Complaint: Patient states" my nails have grown long and thick and become painful to walk and wear shoes" Patient has not been seen in over 6  months.. The patient presents for preventative foot care services. No changes to ROS  Podiatric Exam: Vascular: dorsalis pedis and posterior tibial pulses are palpable bilateral. Capillary return is immediate. Temperature gradient is WNL. Skin turgor WNL  Sensorium: Normal Semmes Weinstein monofilament test. Normal tactile sensation bilaterally. Nail Exam: Pt has thick disfigured discolored nails with subungual debris noted bilateral entire nail hallux through fifth toenails Ulcer Exam: There is no evidence of ulcer or pre-ulcerative changes or infection. Orthopedic Exam: Muscle tone and strength are WNL. No limitations in general ROM. No crepitus or effusions noted. Foot type and digits show no abnormalities. Bony prominences are unremarkable. Skin: No Porokeratosis. No infection or ulcers  Diagnosis:  Onychomycosis, , Pain in right toe, pain in left toes  Treatment & Plan Procedures and Treatment: Consent by patient was obtained for treatment procedures.   Debridement of mycotic and hypertrophic toenails, 1 through 5 bilateral and clearing of subungual debris. No ulceration, no infection noted.  Return Visit-Office Procedure: Patient instructed to return to the office for a follow up visit 4 months for continued evaluation and treatment.    Gardiner Barefoot DPM

## 2019-09-29 ENCOUNTER — Telehealth: Payer: Self-pay | Admitting: Family Medicine

## 2019-09-29 NOTE — Telephone Encounter (Signed)
Left message for patient to call back and schedule Medicare Annual Wellness Visit (AWV) either virtually or audio only. Will also need to schedule labs and CPE with PCP. Thanks  Last AWV 08/25/2018

## 2019-10-02 ENCOUNTER — Telehealth: Payer: Self-pay | Admitting: Family Medicine

## 2019-10-02 DIAGNOSIS — R7309 Other abnormal glucose: Secondary | ICD-10-CM

## 2019-10-02 DIAGNOSIS — M810 Age-related osteoporosis without current pathological fracture: Secondary | ICD-10-CM

## 2019-10-02 DIAGNOSIS — I10 Essential (primary) hypertension: Secondary | ICD-10-CM

## 2019-10-02 DIAGNOSIS — E7800 Pure hypercholesterolemia, unspecified: Secondary | ICD-10-CM

## 2019-10-02 NOTE — Telephone Encounter (Signed)
-----   Message from Cloyd Stagers, RT sent at 09/30/2019  9:34 AM EDT ----- Regarding: Lab Orders for Monday 4.19.2021 Please place lab orders for Monday 4.19.2021, office visit for physical on Friday 4.30.2021 Thank you, Dyke Maes RT(R)

## 2019-10-10 ENCOUNTER — Other Ambulatory Visit (INDEPENDENT_AMBULATORY_CARE_PROVIDER_SITE_OTHER): Payer: Medicare HMO

## 2019-10-10 DIAGNOSIS — I10 Essential (primary) hypertension: Secondary | ICD-10-CM | POA: Diagnosis not present

## 2019-10-10 DIAGNOSIS — R7309 Other abnormal glucose: Secondary | ICD-10-CM

## 2019-10-10 DIAGNOSIS — E7800 Pure hypercholesterolemia, unspecified: Secondary | ICD-10-CM | POA: Diagnosis not present

## 2019-10-10 DIAGNOSIS — M810 Age-related osteoporosis without current pathological fracture: Secondary | ICD-10-CM | POA: Diagnosis not present

## 2019-10-10 LAB — CBC WITH DIFFERENTIAL/PLATELET
Basophils Absolute: 0 10*3/uL (ref 0.0–0.1)
Basophils Relative: 0.3 % (ref 0.0–3.0)
Eosinophils Absolute: 0 10*3/uL (ref 0.0–0.7)
Eosinophils Relative: 0.7 % (ref 0.0–5.0)
HCT: 41.6 % (ref 36.0–46.0)
Hemoglobin: 14 g/dL (ref 12.0–15.0)
Lymphocytes Relative: 27.8 % (ref 12.0–46.0)
Lymphs Abs: 1.9 10*3/uL (ref 0.7–4.0)
MCHC: 33.6 g/dL (ref 30.0–36.0)
MCV: 91.9 fl (ref 78.0–100.0)
Monocytes Absolute: 0.5 10*3/uL (ref 0.1–1.0)
Monocytes Relative: 7.8 % (ref 3.0–12.0)
Neutro Abs: 4.3 10*3/uL (ref 1.4–7.7)
Neutrophils Relative %: 63.4 % (ref 43.0–77.0)
Platelets: 205 10*3/uL (ref 150.0–400.0)
RBC: 4.53 Mil/uL (ref 3.87–5.11)
RDW: 13.7 % (ref 11.5–15.5)
WBC: 6.8 10*3/uL (ref 4.0–10.5)

## 2019-10-10 LAB — COMPREHENSIVE METABOLIC PANEL
ALT: 13 U/L (ref 0–35)
AST: 16 U/L (ref 0–37)
Albumin: 4 g/dL (ref 3.5–5.2)
Alkaline Phosphatase: 68 U/L (ref 39–117)
BUN: 11 mg/dL (ref 6–23)
CO2: 32 mEq/L (ref 19–32)
Calcium: 9.2 mg/dL (ref 8.4–10.5)
Chloride: 98 mEq/L (ref 96–112)
Creatinine, Ser: 0.56 mg/dL (ref 0.40–1.20)
GFR: 101.48 mL/min (ref >60.00)
Glucose, Bld: 136 mg/dL — ABNORMAL HIGH (ref 70–99)
Potassium: 3.9 mEq/L (ref 3.5–5.1)
Sodium: 138 mEq/L (ref 135–145)
Total Bilirubin: 0.6 mg/dL (ref 0.2–1.2)
Total Protein: 6.6 g/dL (ref 6.0–8.3)

## 2019-10-10 LAB — TSH: TSH: 2.14 u[IU]/mL (ref 0.35–4.50)

## 2019-10-10 LAB — LIPID PANEL
Cholesterol: 158 mg/dL (ref 0–200)
HDL: 54.7 mg/dL (ref >39.00)
LDL Cholesterol: 80 mg/dL (ref 0–99)
NonHDL: 103.2
Total CHOL/HDL Ratio: 3
Triglycerides: 115 mg/dL (ref 0.0–149.0)
VLDL: 23 mg/dL (ref 0.0–40.0)

## 2019-10-10 LAB — HEMOGLOBIN A1C: Hgb A1c MFr Bld: 5.8 % (ref 4.6–6.5)

## 2019-10-10 LAB — VITAMIN D 25 HYDROXY (VIT D DEFICIENCY, FRACTURES): VITD: 45.8 ng/mL (ref 30.00–100.00)

## 2019-10-18 ENCOUNTER — Telehealth: Payer: Self-pay

## 2019-10-18 ENCOUNTER — Ambulatory Visit: Payer: Medicare HMO

## 2019-10-18 ENCOUNTER — Other Ambulatory Visit: Payer: Self-pay

## 2019-10-18 NOTE — Telephone Encounter (Signed)
Aware, thanks!

## 2019-10-18 NOTE — Telephone Encounter (Signed)
FYI- Called patient 3 times trying to complete her Medicare visit. Patient never answered. Left message advising patient to call and reschedule appointment or may be completed at upcoming physical.

## 2019-10-21 ENCOUNTER — Ambulatory Visit (INDEPENDENT_AMBULATORY_CARE_PROVIDER_SITE_OTHER): Payer: Medicare HMO | Admitting: Family Medicine

## 2019-10-21 ENCOUNTER — Encounter: Payer: Self-pay | Admitting: Family Medicine

## 2019-10-21 ENCOUNTER — Other Ambulatory Visit: Payer: Self-pay

## 2019-10-21 VITALS — BP 140/76 | HR 80 | Temp 98.0°F | Ht 64.0 in | Wt 111.4 lb

## 2019-10-21 DIAGNOSIS — I10 Essential (primary) hypertension: Secondary | ICD-10-CM | POA: Diagnosis not present

## 2019-10-21 DIAGNOSIS — E7800 Pure hypercholesterolemia, unspecified: Secondary | ICD-10-CM | POA: Diagnosis not present

## 2019-10-21 DIAGNOSIS — H9193 Unspecified hearing loss, bilateral: Secondary | ICD-10-CM | POA: Diagnosis not present

## 2019-10-21 DIAGNOSIS — Z85820 Personal history of malignant melanoma of skin: Secondary | ICD-10-CM | POA: Diagnosis not present

## 2019-10-21 DIAGNOSIS — B351 Tinea unguium: Secondary | ICD-10-CM

## 2019-10-21 DIAGNOSIS — R7309 Other abnormal glucose: Secondary | ICD-10-CM

## 2019-10-21 DIAGNOSIS — M810 Age-related osteoporosis without current pathological fracture: Secondary | ICD-10-CM

## 2019-10-21 DIAGNOSIS — N390 Urinary tract infection, site not specified: Secondary | ICD-10-CM

## 2019-10-21 DIAGNOSIS — Z0000 Encounter for general adult medical examination without abnormal findings: Secondary | ICD-10-CM

## 2019-10-21 MED ORDER — ESTRADIOL 0.1 MG/GM VA CREA: TOPICAL_CREAM | VAGINAL | 6 refills | Status: DC

## 2019-10-21 MED ORDER — FLUTICASONE PROPIONATE 50 MCG/ACT NA SUSP: 2.0000 | Freq: Every day | NASAL | 11 refills | Status: DC

## 2019-10-21 MED ORDER — ATENOLOL 50 MG PO TABS: 50.0000 mg | ORAL_TABLET | Freq: Every day | ORAL | 3 refills | Status: DC

## 2019-10-21 MED ORDER — SIMVASTATIN 20 MG PO TABS: 20.0000 mg | ORAL_TABLET | Freq: Every day | ORAL | 3 refills | Status: DC

## 2019-10-21 NOTE — Patient Instructions (Addendum)
If you are interested in the new shingles vaccine (Shingrix) - call your local pharmacy to check on coverage and availability  If affordable, get on a wait list at your pharmacy to get the vaccine.  Don't walk with both hands full / for risk of falls  If you want to take actonel again to prevent broken bones we can do that- just let me know    Eat more protein instead of sugar- beans/nuts/meat/dairy   Take care of yourself

## 2019-10-21 NOTE — Assessment & Plan Note (Signed)
Pt sees podiatry for regular foot care  Well trimmed

## 2019-10-21 NOTE — Assessment & Plan Note (Signed)
136 this draw Lab Results  Component Value Date   HGBA1C 5.8 10/10/2019   disc imp of low glycemic diet and wt loss to prevent DM2  Enc her to eat more protein and less simple sugar

## 2019-10-21 NOTE — Assessment & Plan Note (Signed)
Disc goals for lipids and reasons to control them Rev last labs with pt Rev low sat fat diet in detail Well controlled with simvastatin and diet

## 2019-10-21 NOTE — Assessment & Plan Note (Signed)
Reviewed health habits including diet and exercise and skin cancer prevention Reviewed appropriate screening tests for age  Also reviewed health mt list, fam hx and immunization status , as well as social and family history   See HPI Labs reviewed  Disc fall prev in detail (she declines testing or tx of OP) Declines breast cancer screening but does exams Tetanus shot def due to insurance (will call if she gets a wound) Disc shingrix vaccine Disc covid vaccine- she declines/ questions answered and I enc her to get immunized  Advance directive done and discussed No cognitive concerns Good hearing screen Has regular eye exams (recent drops prevented her from doing vision screen today) Enc more protein calories

## 2019-10-21 NOTE — Assessment & Plan Note (Signed)
Enc her to f/u with dermatology for skin screen

## 2019-10-21 NOTE — Assessment & Plan Note (Signed)
Declines further dexa tests  2 falls/no fx (disc fall prev)  Taking ca/D Gets exercise   She is a candidate for repeat course of bisphosphonate (actonel) if she wants to She declines at this time

## 2019-10-21 NOTE — Assessment & Plan Note (Signed)
Hearing screen was improved today  Pt has no complaints

## 2019-10-21 NOTE — Assessment & Plan Note (Signed)
bp in fair control at this time  BP Readings from Last 1 Encounters:  10/21/19 140/76   No changes needed Most recent labs reviewed  Disc lifstyle change with low sodium diet and exercise

## 2019-10-21 NOTE — Progress Notes (Signed)
Subjective:    Patient ID: Paula Jimenez, female    DOB: 08-08-1926, 86 y.o.   MRN: [id number]  This visit occurred during the SARS-CoV-2 public health emergency.  Safety protocols were in place, including screening questions prior to the visit, additional usage of staff PPE, and extensive cleaning of exam room while observing appropriate contact time as indicated for disinfecting solutions.    HPI  Pt presents for amw and health mt visit  I have personally reviewed the Medicare Annual Wellness questionnaire and have noted 1. The patient's medical and social history 2. Their use of alcohol, tobacco or illicit drugs 3. Their current medications and supplements 4. The patient's functional ability including ADL's, fall risks, home safety risks and hearing or visual             impairment. 5. Diet and physical activities 6. Evidence for depression or mood disorders  The patients weight, height, BMI have been recorded in the chart and visual acuity is per eye clinic.  I have made referrals, counseling and provided education to the patient based review of the above and I have provided the pt with a written personalized care plan for preventive services. Reviewed and updated provider list, see scanned forms.  See scanned forms.  Routine anticipatory guidance given to patient.  See health maintenance. covid status -declines imm  (she is not around other people)  Colon cancer screening-out aged Breast cancer screening   Mammogram 6/13- declines due to age  Self breast exam-no lumps  Flu vaccine 9/20 Tetanus vaccine 5/10   (def due to lack of ins cov) Pneumovax-completed Zoster vaccine-zostavax 2/18   ? 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  Overall memory and cognition are great unless she has a sleepless night   PMH and SH reviewed  Meds, vitals, and allergies reviewed.   ROS: See HPI.  Otherwise negative.    Specialists inv in care: Dr Clide Dales -opth  fam hx -sister had leukemia   Weight : Wt Readings from Last 3 Encounters:  10/21/19 111 lb 6 oz (50.5 kg)  05/30/19 116 lb 7 oz (52.8 kg)  05/17/19 117 lb 2 oz (53.1 kg)   19.12 kg/m   Hearing/vision:  Hearing Screening   125Hz  250Hz  500Hz  1000Hz  2000Hz  3000Hz  4000Hz  6000Hz  8000Hz   Right ear:   40 40 40  40    Left ear:   40 40 40  0    Vision Screening Comments: Unable to preform due to eye irritation   Hearing looks pretty good  Has regular eye exams - she just used her drops and could not do eye exam   Hypertension  bp is stable today  No cp or palpitations or headaches or edema  No side effects to medicines  BP Readings from Last 3 Encounters:  10/21/19 140/76  05/30/19 (!) 144/78  05/17/19 138/80     Lab Results  Component Value Date   CREATININE 0.56 10/10/2019   BUN 11 10/10/2019   NA 138 10/10/2019   K 3.9 10/10/2019  CL 98 10/10/2019   CO2 32 10/10/2019    Elevated glucose   136 Ate too much candy since christmas  Lab Results  Component Value Date   HGBA1C 5.8 10/10/2019    Past h/o melanoma  Has not had a skin check recently (a year ago) Has some psoriasis on scalp    Has injury of her R wrist from working with a vine working in the yard A little red   Hyperlipidemia  Lab Results  Component Value Date   CHOL 158 10/10/2019   CHOL 189 08/23/2018   CHOL 162 08/12/2017   Lab Results  Component Value Date   HDL 54.70 10/10/2019   HDL 55.30 08/23/2018   HDL 52.60 08/12/2017   Lab Results  Component Value Date   LDLCALC 80 10/10/2019   [place] 106  (H) 08/23/2018   [place] 86 08/12/2017   Lab Results  Component Value Date   TRIG 115.0 10/10/2019   TRIG 139.0 08/23/2018   TRIG 116.0 08/12/2017   Lab Results  Component Value Date   CHOLHDL 3 10/10/2019   CHOLHDL 3 08/23/2018   CHOLHDL 3 08/12/2017   Lab Results  Component Value Date   LDLDIRECT 159.5 02/19/2009   LDLDIRECT 158.2 11/10/2008   LDLDIRECT 138.6 10/26/2006   Simvastatin and diet   Uses estrace cream three days per week for urinary issues Needs a refill   Lab Results  Component Value Date   WBC 6.8 10/10/2019   HGB 14.0 10/10/2019   HCT 41.6 10/10/2019   MCV 91.9 10/10/2019   PLT 205.0 10/10/2019   Lab Results  Component Value Date   TSH 2.14 10/10/2019    Lab Results  Component Value Date   ALT 13 10/10/2019   AST 16 10/10/2019   ALKPHOS 68 10/10/2019   BILITOT 0.6 10/10/2019    . Patient Active Problem List   Diagnosis Date Noted  . Fatigue 05/30/2019  . Onychomycosis of toenail 03/02/2019  . Urethral caruncle 12/10/2018  . Acute cystitis 11/29/2018  . Elevated random blood glucose level 08/25/2018  . Medicare annual wellness visit, subsequent 08/25/2018  . Loose stools 06/28/2018  . Internal hemorrhoid, bleeding 04/20/2018  . Corn of toe 04/20/2018  . Ingrown nail of great toe of right foot 11/05/2017  . Routine general medical examination at a health care facility 08/07/2016  . Frequent UTI 02/16/2015  . Epistaxis, recurrent 02/28/2014  . History of shingles 02/20/2014  . Hearing loss 09/23/2007  . History of melanoma 02/25/2007  . Hyperlipidemia 02/25/2007  . MITRAL VALVE PROLAPSE 02/25/2007  . ALLERGIC RHINITIS 02/25/2007  . DEGENERATIVE DISC DISEASE, LUMBOSACRAL SPINE 02/25/2007  . Age related osteoporosis 02/25/2007  . HYPERTENSION, BENIGN 10/26/2006   Past Medical History:  Diagnosis Date  . Allergy   . Asthma    years ago, 2-3 times a year pt experiences a problem with breathing.  . Bursitis, hip   . Family history  of adverse reaction to anesthesia    DAUGHTER had a reaction to ansethesia  . Hyperlipidemia   . Hypertension   . HYPERTENSION, BENIGN 10/26/2006   Qualifier: Diagnosis of  By: Selinda Orion    . MITRAL VALVE PROLAPSE 02/25/2007   Qualifier: History of  By: Marcelino Scot CMA, [place]    . Osteopenia 08/01  . Osteoporosis   . Skin cancer 2015   nose   Past Surgical History:  Procedure Laterality Date  . ABDOMINAL HYSTERECTOMY  1971  . BREAST BIOPSY Right 06/99  fibrocystic  . CYSTOSCOPY WITH BIOPSY N/A 05/31/2015   Procedure: CYSTOSCOPY WITH BIOPSY/ fulgeration;  Surgeon: Hollice Espy, MD;  Location: [facility] ORS;  Service: Urology;  Laterality: N/A;  . DENTAL SURGERY    . EYE SURGERY Bilateral 2011   June and August of 2011  . ORIF RADIAL FRACTURE Left 11/99   arm fracture, radial no surgery  . TONSILLECTOMY     Social History   Tobacco Use  . Smoking status: Never Smoker  . Smokeless tobacco: Never Used  Substance Use Topics  . Alcohol use: No    Alcohol/week: 0.0 standard drinks  . Drug use: No   Family History  Problem Relation Age of Onset  . Leukemia Sister   . Cancer Sister        leukemia  . Heart disease Mother   . Diabetes Mother   . Heart failure Mother   . Stroke Father   . Kidney disease Neg Hx   . Kidney cancer Neg Hx   . Bladder Cancer Neg Hx    Allergies  Allergen Reactions  . Clindamycin/Lincomycin Swelling    Throat swelling  . Codeine     Unknown per pt   . Levofloxacin     Unknown per pt   . Rofecoxib     Unknown per pt   . Valtrex [Valacyclovir Hcl]     Unknown per pt   . Adhesive [Tape] Rash  . Ciprofloxacin Diarrhea  . Nitrofurantoin Nausea Only  . Penicillins Swelling    mouth swelling  . Raloxifene Rash  . Tramadol     dizzy   Current Outpatient Medications on File Prior to Visit  Medication Sig Dispense Refill  . Alum Hydroxide-Mag Carbonate (GAVISCON EXTRA STRENGTH) 160-105 MG CHEW Chew 1 tablet by mouth daily as needed  (for heartburn).     Skipper Cliche SALINE NASAL NA Place 1 spray into the nose daily as needed (allergies).     . clotrimazole-betamethasone (LOTRISONE) cream Apply 1 application topically as needed.    . fluorometholone (FML) 0.1 % ophthalmic suspension Place 1 drop into both eyes 2 (two) times daily.    . hydrocortisone (ANUSOL-HC) 25 MG suppository Place 1 suppository (25 mg total) rectally 2 (two) times daily as needed for hemorrhoids or anal itching. 12 suppository 1  . loperamide (IMODIUM) 2 MG capsule Take 2 mg by mouth as needed for diarrhea or loose stools (has been taking with Clindamycin).    [name] loratadine (CLARITIN) 10 MG tablet Take 10 mg by mouth as needed for allergies or rhinitis.     . Multiple Vitamin (MULTIVITAMIN) capsule Take 1 capsule by mouth daily.     No current facility-administered medications on file prior to visit.    Review of Systems  Constitutional: Negative for activity change, appetite change, fatigue, fever and unexpected weight change.  HENT: Negative for congestion, ear pain, rhinorrhea, sinus pressure and sore throat.   Eyes: Negative for pain, redness and visual disturbance.  Respiratory: Negative for cough, shortness of breath and wheezing.   Cardiovascular: Negative for chest pain and palpitations.  Gastrointestinal: Negative for abdominal pain, blood in stool, constipation and diarrhea.  Endocrine: Negative for polydipsia and polyuria.  Genitourinary: Negative for dysuria, frequency and urgency.  Musculoskeletal: Negative for arthralgias, back pain and myalgias.       Some aches and pains  Skin: Negative for pallor and rash.       Healed injury on R arm from vine outdoors  Allergic/Immunologic: Negative for  environmental allergies.  Neurological: Negative for dizziness, syncope and headaches.  Hematological: Negative for adenopathy. Does not bruise/bleed easily.  Psychiatric/Behavioral: Negative for decreased concentration and dysphoric mood. The patient is  not nervous/anxious.        Objective:   Physical Exam Constitutional:      General: She is not in acute distress.    Appearance: Normal appearance. She is well-developed and normal weight. She is not ill-appearing or diaphoretic.  HENT:     Head: Normocephalic and atraumatic.     Right Ear: Tympanic membrane, ear canal and external ear normal.     Left Ear: Tympanic membrane, ear canal and external ear normal.     Nose: Nose normal. No congestion.     Mouth/Throat:     Mouth: Mucous membranes are moist.     Pharynx: Oropharynx is clear. No posterior oropharyngeal erythema.  Eyes:     General: No scleral icterus.    Extraocular Movements: Extraocular movements intact.     Conjunctiva/sclera: Conjunctivae normal.     Pupils: Pupils are equal, round, and reactive to light.  Neck:     Thyroid: No thyromegaly.     Vascular: No carotid bruit or JVD.  Cardiovascular:     Rate and Rhythm: Normal rate and regular rhythm.     Pulses: Normal pulses.     Heart sounds: Normal heart sounds. No gallop.      Comments: Varicosities /small on ankles and lower legs Pulmonary:     Effort: Pulmonary effort is normal. No respiratory distress.     Breath sounds: Normal breath sounds. No wheezing.     Comments: Good air exch Chest:     Chest wall: No tenderness.  Abdominal:     General: Bowel sounds are normal. There is no distension or abdominal bruit.     Palpations: Abdomen is soft. There is no mass.     Tenderness: There is no abdominal tenderness.     Hernia: No hernia is present.  Genitourinary:    Comments: Breast exam: No mass, nodules, thickening, tenderness, bulging, retraction, inflamation, nipple discharge or skin changes noted.  No axillary or clavicular LA.     Musculoskeletal:        General: No tenderness. Normal range of motion.     Cervical back: Normal range of motion and neck supple. No rigidity. No muscular tenderness.     Right lower leg: No edema.     Left lower leg: No  edema.     Comments: No kyphosis  Lymphadenopathy:     Cervical: No cervical adenopathy.  Skin:    General: Skin is warm and dry.     Coloration: Skin is not pale.     Findings: No erythema or rash.     Comments: Area of erythema and scab on dorsal R wrist from injury- healing and no signs of infection or tenderness Small angiomas diffusely  sks diffusely  Solar lentigines diffusely   Neurological:     Mental Status: She is alert. Mental status is at baseline.     Cranial Nerves: No cranial nerve deficit.     Motor: No abnormal muscle tone.     Coordination: Coordination normal.     Gait: Gait normal.     Deep Tendon Reflexes: Reflexes are normal and symmetric. Reflexes normal.  Psychiatric:        Mood and Affect: Mood normal.        Cognition and Memory: Cognition and memory normal.  Comments: Mentally sharp           Assessment & Plan:   Problem List Items Addressed This Visit      Cardiovascular and Mediastinum   HYPERTENSION, BENIGN    bp in fair control at this time  BP Readings from Last 1 Encounters:  10/21/19 140/76   No changes needed Most recent labs reviewed  Disc lifstyle change with low sodium diet and exercise        Relevant Medications   atenolol (TENORMIN) 50 MG tablet   simvastatin (ZOCOR) 20 MG tablet     Nervous and Auditory   Hearing loss    Hearing screen was improved today  Pt has no complaints        Musculoskeletal and Integument   Age related osteoporosis    Declines further dexa tests  2 falls/no fx (disc fall prev)  Taking ca/D Gets exercise   She is a candidate for repeat course of bisphosphonate (actonel) if she wants to She declines at this time       Onychomycosis of toenail    Pt sees podiatry for regular foot care  Well trimmed        Genitourinary   Frequent UTI    Estrace cream has helped  Originally from urology Refilled today        Other   History of melanoma    Enc her to f/u with  dermatology for skin screen        Hyperlipidemia    Disc goals for lipids and reasons to control them Rev last labs with pt Rev low sat fat diet in detail Well controlled with simvastatin and diet       Relevant Medications   atenolol (TENORMIN) 50 MG tablet   simvastatin (ZOCOR) 20 MG tablet   Routine general medical examination at a health care facility    Reviewed health habits including diet and exercise and skin cancer prevention Reviewed appropriate screening tests for age  Also reviewed health mt list, fam hx and immunization status , as well as social and family history   See HPI Labs reviewed  Disc fall prev in detail (she declines testing or tx of OP) Declines breast cancer screening but does exams Tetanus shot def due to insurance (will call if she gets a wound) Disc shingrix vaccine Disc covid vaccine- she declines/ questions answered and I enc her to get immunized  Advance directive done and discussed No cognitive concerns Good hearing screen Has regular eye exams (recent drops prevented her from doing vision screen today) Enc more protein calories       Elevated random blood glucose level    136 this draw Lab Results  Component Value Date   HGBA1C 5.8 10/10/2019   disc imp of low glycemic diet and wt loss to prevent DM2  Enc her to eat more protein and less simple sugar      Medicare annual wellness visit, subsequent - Primary    Reviewed health habits including diet and exercise and skin cancer prevention Reviewed appropriate screening tests for age  Also reviewed health mt list, fam hx and immunization status , as well as social and family history   See HPI Labs reviewed  Disc fall prev in detail (she declines testing or tx of OP) Declines breast cancer screening but does exams Tetanus shot def due to insurance (will call if she gets a wound) Disc shingrix vaccine Disc covid vaccine- she declines/ questions answered and I  enc her to get immunized   Advance directive done and discussed No cognitive concerns Good hearing screen Has regular eye exams (recent drops prevented her from doing vision screen today) Enc more protein calories

## 2019-10-21 NOTE — Assessment & Plan Note (Signed)
Estrace cream has helped  Originally from urology Refilled today

## 2020-01-16 ENCOUNTER — Ambulatory Visit: Payer: Medicare HMO | Admitting: Podiatry

## 2020-01-16 ENCOUNTER — Other Ambulatory Visit: Payer: Self-pay

## 2020-01-16 ENCOUNTER — Encounter: Payer: Self-pay | Admitting: Podiatry

## 2020-01-16 DIAGNOSIS — B351 Tinea unguium: Secondary | ICD-10-CM

## 2020-01-16 DIAGNOSIS — M79676 Pain in unspecified toe(s): Secondary | ICD-10-CM

## 2020-01-16 NOTE — Progress Notes (Signed)
Complaint:  Visit Type: Patient returns to my office for continued preventative foot care services. Complaint: Patient states" my nails have grown long and thick and become painful to walk and wear shoes" Patient has not been seen in over 6  months.. The patient presents for preventative foot care services. No changes to ROS  Podiatric Exam: Vascular: dorsalis pedis and posterior tibial pulses are palpable bilateral. Capillary return is immediate. Temperature gradient is WNL. Skin turgor WNL  Sensorium: Normal Semmes Weinstein monofilament test. Normal tactile sensation bilaterally. Nail Exam: Pt has thick disfigured discolored nails with subungual debris noted bilateral entire nail hallux through fifth toenails Ulcer Exam: There is no evidence of ulcer or pre-ulcerative changes or infection. Orthopedic Exam: Muscle tone and strength are WNL. No limitations in general ROM. No crepitus or effusions noted. Foot type and digits show no abnormalities. Bony prominences are unremarkable. Skin: No Porokeratosis. No infection or ulcers  Diagnosis:  Onychomycosis, , Pain in right toe, pain in left toes  Treatment & Plan Procedures and Treatment: Consent by patient was obtained for treatment procedures.   Debridement of mycotic and hypertrophic toenails, 1 through 5 bilateral and clearing of subungual debris. No ulceration, no infection noted.  Return Visit-Office Procedure: Patient instructed to return to the office for a follow up visit 6 months for continued evaluation and treatment.    Gardiner Barefoot DPM

## 2020-02-06 NOTE — Progress Notes (Signed)
02/07/2020 10:08 AM   Paula Jimenez 1926/08/12 [id number]  Referring provider: Abner Greenspan, MD [address] [place],  [place] [address]  Chief Complaint  Patient presents with  . Follow-up    HPI: Patient is an 86 year old female who presents with a history of gross hematuria, right renal angiomyolipoma, vaginal atrophy with an urethral caruncle and a history of recurrent UTI's who presents today for follow up.  History of hematuria Patient completed hematuria work up in 05/2015 with CT Urogram and cystoscopy and bladder biopsies.  No malignancies were found.   No gross hematuria.    Right renal angiomyolipoma CT w/wo performed on 02/28/2016 noted slight interval enlargement of right renal angiomyolipoma.  RUS performed on 01/27/2017 noted 2.6 cm right lower pole angiomyolipoma, stable from 2017 CT.  RUS on 01/2018 noted angiomyolipoma of right kidney unchanged compared to prior exams.  RUS performed on 01/31/2019 revealed no significant interval change in the size of the known right renal angiomyolipoma.  No hydronephrosis or shadowing stone.  Fatty liver  Vaginal atrophy Patient is using the vaginal estrogen cream three nights weekly.  She has not experienced any rash or irritation.   She has not had any vaginal discharge.    History of recurrent UTI's Risk factors:  Age, vaginal atrophy.   She has not had one documented UTI since our last visit.  She is asymptomatic at this visit.    PMH: Past Medical History:  Diagnosis Date  . Allergy   . Asthma    years ago, 2-3 times a year pt experiences a problem with breathing.  . Bursitis, hip   . Family history of adverse reaction to anesthesia    DAUGHTER had a reaction to ansethesia  . Hyperlipidemia   . Hypertension   . HYPERTENSION, BENIGN 10/26/2006   Qualifier: Diagnosis of  By: Selinda Orion    . MITRAL VALVE PROLAPSE 02/25/2007   Qualifier: History of  By: Marcelino Scot CMA, [place] Bil[place]    . Osteopenia 08/01  .  Osteoporosis   . Skin cancer 2015   nose    Surgical History: Past Surgical History:  Procedure Laterality Date  . ABDOMINAL HYSTERECTOMY  1971  . BREAST BIOPSY Right 06/99   fibrocystic  . CYSTOSCOPY WITH BIOPSY N/A 05/31/2015   Procedure: CYSTOSCOPY WITH BIOPSY/ fulgeration;  Surgeon: Hollice Espy, MD;  Location: [facility] ORS;  Service: Urology;  Laterality: N/A;  . DENTAL SURGERY    . EYE SURGERY Bilateral 2011   June and August of 2011  . ORIF RADIAL FRACTURE Left 11/99   arm fracture, radial no surgery  . TONSILLECTOMY      Home Medications:  Allergies as of 02/07/2020      Reactions   Clindamycin/lincomycin Swelling   Throat swelling   Codeine    Unknown per pt   Levofloxacin    Unknown per pt   Rofecoxib    Unknown per pt   Valtrex [valacyclovir Hcl]    Unknown per pt   Adhesive [tape] Rash   Ciprofloxacin Diarrhea   Nitrofurantoin Nausea Only   Penicillins Swelling   mouth swelling   Raloxifene Rash   Tramadol    dizzy      Medication List       Accurate as of February 07, 2020 10:08 AM. If you have any questions, ask your nurse or doctor.        atenolol 50 MG tablet Commonly known as: TENORMIN Take 1 tablet (50  mg total) by mouth daily.   AYR SALINE NASAL NA Place 1 spray into the nose daily as needed (allergies).   clotrimazole-betamethasone cream Commonly known as: LOTRISONE Apply 1 application topically as needed.   estradiol 0.1 MG/GM vaginal cream Commonly known as: ESTRACE Vaginal Estrogen Cream apply a blueberry sized amount to vaginal opening using finger tip 3 times weekly before bed.   fluorometholone 0.1 % ophthalmic suspension Commonly known as: FML Place 1 drop into both eyes 2 (two) times daily.   fluticasone 50 MCG/ACT nasal spray Commonly known as: FLONASE Place 2 sprays into both nostrils daily.   Gaviscon Extra Strength 160-105 MG Chew Generic drug: Alum Hydroxide-Mag Carbonate Chew 1 tablet by mouth daily as needed  (for heartburn).   hydrocortisone 25 MG suppository Commonly known as: ANUSOL-HC Place 1 suppository (25 mg total) rectally 2 (two) times daily as needed for hemorrhoids or anal itching.   loperamide 2 MG capsule Commonly known as: IMODIUM Take 2 mg by mouth as needed for diarrhea or loose stools (has been taking with Clindamycin).   loratadine 10 MG tablet Commonly known as: CLARITIN Take 10 mg by mouth as needed for allergies or rhinitis.   multivitamin capsule Take 1 capsule by mouth daily.   simvastatin 20 MG tablet Commonly known as: ZOCOR Take 1 tablet (20 mg total) by mouth daily.       Allergies:  Allergies  Allergen Reactions  . Clindamycin/Lincomycin Swelling    Throat swelling  . Codeine     Unknown per pt   . Levofloxacin     Unknown per pt   . Rofecoxib     Unknown per pt   . Valtrex [Valacyclovir Hcl]     Unknown per pt   . Adhesive [Tape] Rash  . Ciprofloxacin Diarrhea  . Nitrofurantoin Nausea Only  . Penicillins Swelling    mouth swelling  . Raloxifene Rash  . Tramadol     dizzy    Family History: Family History  Problem Relation Age of Onset  . Leukemia Sister   . Cancer Sister        leukemia  . Heart disease Mother   . Diabetes Mother   . Heart failure Mother   . Stroke Father   . Kidney disease Neg Hx   . Kidney cancer Neg Hx   . Bladder Cancer Neg Hx     Social History:  reports that she has never smoked. She has never used smokeless tobacco. She reports that she does not drink alcohol and does not use drugs.  ROS: For pertinent review of systems please refer to history of present illness  Physical Exam: BP (!) 155/79   Pulse 76   Ht 5\' 4"  (1.626 m)   Wt 107 lb 14.4 oz (48.9 kg)   BMI 18.52 kg/m   Constitutional:  Well nourished. Alert and oriented, No acute distress. HEENT: [place] AT, mask in place Trachea midline Cardiovascular: No clubbing, cyanosis, or edema. Respiratory: Normal respiratory effort, no increased work  of breathing. GI: Abdomen is soft, non tender, non distended, no abdominal masses. Liver and spleen not palpable.  No hernias appreciated.  Stool sample for occult testing is not indicated.   GU: No CVA tenderness.  No bladder fullness or masses.  Atrophic external genitalia, sparse pubic hair distribution, no lesions.  Normal urethral meatus, no lesions, no prolapse, no discharge.   No urethral masses, tenderness and/or tenderness. No bladder fullness, tenderness or masses. Pale vagina mucosa, fair estrogen  effect, no discharge, no lesions, fair pelvic support, grade I cystocele and rectocele noted.  Cervix, uterus and adnexa are surgically absent.  Anus and perineum are without rashes or lesions.    Skin: No rashes, bruises or suspicious lesions. Lymph: No inguinal adenopathy. Neurologic: Grossly intact, no focal deficits, moving all 4 extremities. Psychiatric: Normal mood and affect.   Laboratory Data: Lab Results  Component Value Date   WBC 6.8 10/10/2019   HGB 14.0 10/10/2019   HCT 41.6 10/10/2019   MCV 91.9 10/10/2019   PLT 205.0 10/10/2019    Lab Results  Component Value Date   CREATININE 0.56 10/10/2019    Lab Results  Component Value Date   TSH 2.14 10/10/2019       Component Value Date/Time   CHOL 158 10/10/2019 0808   HDL 54.70 10/10/2019 0808   CHOLHDL 3 10/10/2019 0808   VLDL 23.0 10/10/2019 0808   LDLCALC 80 10/10/2019 0808    Lab Results  Component Value Date   AST 16 10/10/2019   Lab Results  Component Value Date   ALT 13 10/10/2019   I have reviewed the labs  Pertinent Imaging: CLINICAL DATA:  74-year-old female with follow-up for angiomyolipoma of the right kidney.  EXAM: RENAL / URINARY TRACT ULTRASOUND COMPLETE  COMPARISON:  Renal ultrasound dated 01/27/2018  FINDINGS: Right Kidney:  Renal measurements: 9.8 x 3.8 x 4.0 cm = volume: 78 mL. Normal echogenicity. No hydronephrosis or shadowing stone. There is a 2.1 x 2.1 x 2.1 cm  (previously measuring 2.1 x 2.3 x 1.8 cm) echogenic lesion arising from the medial inferior pole of the right kidney corresponding to the known angiomyolipoma.  Left Kidney:  Renal measurements: 9.3 x 4.4 x 4.7 cm = volume: 98 mL. Echogenicity within normal limits. No mass or hydronephrosis visualized.  Bladder:  Appears normal for degree of bladder distention.  There is incidental note of a fatty liver.  IMPRESSION: 1. No significant interval change in the size of the known right renal angiomyolipoma. 2. No hydronephrosis or shadowing stone. 3. Fatty liver.   Electronically Signed   By: Anner Crete M.D.   On: 01/31/2019 20:38    Assessment & Plan:    1. Right renal angiomyolipoma RUS ordered Will call with results  2. Vaginal atrophy Continue vaginal estrogen cream RTC in one year for exam  3. Urethral caruncle See above  4.  History of recurrent UTI's No symptoms at this time  5. History of hematuria Completed hematuria work up in 05/2015, no findings of malignancies.  No recent hematuria.   Patient will contact the office if she experiencing gross hematuria   Return in about 1 year (around 02/06/2021) for RUS and exam.  These notes generated with voice recognition software. I apologize for typographical errors.  Zara Council, PA-C  [facility] [address] [place], [place] [address] [phone]  I spent 30 minutes on the day of the encounter to include pre-visit record review, face-to-face time with the patient, and post-visit ordering of tests.

## 2020-02-07 ENCOUNTER — Encounter: Payer: Self-pay | Admitting: Urology

## 2020-02-07 ENCOUNTER — Ambulatory Visit: Payer: Medicare HMO | Admitting: Urology

## 2020-02-07 ENCOUNTER — Other Ambulatory Visit: Payer: Self-pay | Admitting: Family Medicine

## 2020-02-07 ENCOUNTER — Other Ambulatory Visit: Payer: Self-pay

## 2020-02-07 VITALS — BP 155/79 | HR 76 | Ht 64.0 in | Wt 107.9 lb

## 2020-02-07 DIAGNOSIS — D179 Benign lipomatous neoplasm, unspecified: Secondary | ICD-10-CM

## 2020-02-07 DIAGNOSIS — N952 Postmenopausal atrophic vaginitis: Secondary | ICD-10-CM | POA: Diagnosis not present

## 2020-02-07 DIAGNOSIS — N362 Urethral caruncle: Secondary | ICD-10-CM

## 2020-03-21 ENCOUNTER — Ambulatory Visit
Admission: RE | Admit: 2020-03-21 | Discharge: 2020-03-21 | Disposition: A | Discharge status: Home / Self Care | Payer: Medicare HMO | Source: Ambulatory Visit | Attending: Urology | Admitting: Urology

## 2020-03-21 ENCOUNTER — Other Ambulatory Visit: Payer: Self-pay

## 2020-03-21 DIAGNOSIS — D179 Benign lipomatous neoplasm, unspecified: Secondary | ICD-10-CM

## 2020-03-21 DIAGNOSIS — N2889 Other specified disorders of kidney and ureter: Secondary | ICD-10-CM | POA: Insufficient documentation

## 2020-03-21 DIAGNOSIS — Z86018 Personal history of other benign neoplasm: Secondary | ICD-10-CM | POA: Insufficient documentation

## 2020-03-21 DIAGNOSIS — D3001 Benign neoplasm of right kidney: Secondary | ICD-10-CM | POA: Diagnosis not present

## 2020-03-23 ENCOUNTER — Encounter: Payer: Self-pay | Admitting: Family Medicine

## 2020-04-02 ENCOUNTER — Telehealth: Payer: Self-pay

## 2020-04-02 DIAGNOSIS — N3001 Acute cystitis with hematuria: Secondary | ICD-10-CM | POA: Diagnosis not present

## 2020-04-02 NOTE — Telephone Encounter (Signed)
[facility] Night - Client TELEPHONE ADVICE RECORD [facility] Patient Name: Paula Jimenez Gender: Female DOB: February 04, 1927 Age: 86 Y 6 M 1 D Return Phone Number: [phone] (Primary), [phone] (Secondary) Address: [place] [address] Client [place] Night - Client Client Site [facility] Physician Tower, Roque Lias - MD Contact Type Call Who Is Calling Patient / Member / Family / Caregiver Call Type Triage / Clinical Relationship To Patient Self Return Phone Number [phone] (Primary) Chief Complaint Urination Pain Reason for Call Medication Question / Request Initial Comment Caller stated she has a bladder infection, would like Dr. Glori Bickers would call in a prescription to [facility] in [place] [phone]. Translation No Nurse Assessment Nurse: Luvenia Heller, RN, Adriana Date/Time (Eastern Time): 04/01/2020 4:31:30 PM Confirm and document reason for call. If symptomatic, describe symptoms. ---Caller reports a spot of blood on TP, and darker urine. Caller reports burning with urination. Does the patient have any new or worsening symptoms? ---Yes Will a triage be completed? ---Yes Related visit to physician within the last 2 weeks? ---No Does the PT have any chronic conditions? (i.e. diabetes, asthma, this includes High risk factors for pregnancy, etc.) ---Yes List chronic conditions. ---HTN, Hyperlipidemia, Is this a behavioral health or substance abuse call? ---No Guidelines Guideline Title Affirmed Question Affirmed Notes Nurse Date/Time (Eastern Time) Urinary Symptoms Urinating more frequently than usual (i.e., frequency) Cisneros, RN, [place] 04/01/2020 4:34:14 PM Disp. Time Eilene Ghazi Time) Disposition Final User 04/01/2020 4:36:25 PM See PCP within 24 Hours Yes Cisneros, RN, Fabio Bering Caller Disagree/Comply Comply Caller Understands Yes PLEASE NOTE: All timestamps contained within  this report are represented as [place] Standard Time. CONFIDENTIALTY NOTICE: This fax transmission is intended only for the addressee. It contains information that is legally privileged, confidential or otherwise protected from use or disclosure. If you are not the intended recipient, you are strictly prohibited from reviewing, disclosing, copying using or disseminating any of this information or taking any action in reliance on or regarding this information. If you have received this fax in error, please notify us immediately by telephone so that we can arrange for its return to [place]. Phone: [phone], Toll-Free: [phone], Fax: [phone] Page: 2 of 2 Call Id: [id number] PreDisposition Call Doctor Care Advice Given Per Guideline SEE PCP WITHIN 24 HOURS: * IF OFFICE WILL BE OPEN: You need to be examined within the next 24 hours. Call your doctor (or NP/PA) when the office opens and make an appointment. CALL BACK IF: * You become worse CARE ADVICE given per Urinary Symptoms (Adult) guideline. Referrals REFERRED TO PCP OFFICE

## 2020-04-02 NOTE — Telephone Encounter (Signed)
I spoke with pt; pt has blood in urine this morning with burning and pain upon urination. Pt does not remember name of UC in [place] that she has been to previously but she will go there this morning for eval and treatment. FYI to Dr Glori Bickers. No available appts at [facility].

## 2020-04-02 NOTE — Telephone Encounter (Signed)
Aware, will watch for correspondence  

## 2020-04-05 NOTE — Telephone Encounter (Signed)
Patient called in with daughter on line. Stated they went to [facility] in [place] for bladder infection and was given cethalexin 500mg  for 3x daily. Patient's daughter stated this medication makes her dizzy to the point of almost falling and is wondering if Dr.Tower can prescribe her what she has in the past for a UTI. Did notify family and patient I can request this but cannot guarantee since Dr.Tower did not see patient. Voiced understanding and notified to call [facility] also to see if they can change or given alternative. Please advise if able to switch for patient.

## 2020-04-05 NOTE — Telephone Encounter (Signed)
Ironically that WAS the medicine I gave her last  I rev he med intolerances and it looks like the only thing she may tolerate is a sulfa drug (bactrim)  I do not see a culture result yet- I think it is pending   Please send in bactrim ds 1 po bid for 5 d. #10 no ref Make sure she tells the UC what happened and that we are changing tx  They still need to alert her (and [place] if possible) about culture (knowing that she is now on different abx)

## 2020-04-09 MED ORDER — SULFAMETHOXAZOLE-TRIMETHOPRIM 800-160 MG PO TABS: 1.0000 | ORAL_TABLET | Freq: Two times a day (BID) | ORAL | 0 refills | Status: DC

## 2020-04-09 NOTE — Telephone Encounter (Signed)
Patient informed and verbalized understanding.  Medication sent in to preferred pharmacy per protocol.   Patient is scheduled to come in 10/19 at 9:00 am. Confirmed this with the Patient

## 2020-04-09 NOTE — Addendum Note (Signed)
Addended by: Thressa Sheller on: 04/09/2020 09:39 AM   Modules accepted: Orders

## 2020-04-10 ENCOUNTER — Other Ambulatory Visit: Payer: Self-pay

## 2020-04-10 ENCOUNTER — Ambulatory Visit (INDEPENDENT_AMBULATORY_CARE_PROVIDER_SITE_OTHER): Payer: Medicare HMO | Admitting: Family Medicine

## 2020-04-10 ENCOUNTER — Encounter: Payer: Self-pay | Admitting: Family Medicine

## 2020-04-10 VITALS — BP 130/76 | HR 75 | Temp 97.1°F | Ht 64.0 in | Wt 107.2 lb

## 2020-04-10 DIAGNOSIS — N3001 Acute cystitis with hematuria: Secondary | ICD-10-CM

## 2020-04-10 DIAGNOSIS — R42 Dizziness and giddiness: Secondary | ICD-10-CM | POA: Diagnosis not present

## 2020-04-10 NOTE — Assessment & Plan Note (Signed)
Acute on chronic  Worse with uti and last abx  Improving now  Few beats or horiz nystagmus bilat today  Enc to continue flonase to avoid congestion  Update if not starting to improve in a week or if worsening

## 2020-04-10 NOTE — Assessment & Plan Note (Signed)
Recent uti caused confusion/dysuria and hematuria  Rev note and labs from urgent care  E coli- pan sensitive Did not tolerate keflex-dizziness /sick  Was going to change to cipro/pharm would not fill Now on bactrim  Symptoms are gone except mild dizziness Disc fall prev and also need for hydration  Update if not starting to improve in a week or if worsening

## 2020-04-10 NOTE — Progress Notes (Signed)
Subjective:    Patient ID: Paula Jimenez, female    DOB: 12-02-1926, 86 y.o.   MRN: [id number]  This visit occurred during the SARS-CoV-2 public health emergency.  Safety protocols were in place, including screening questions prior to the visit, additional usage of staff PPE, and extensive cleaning of exam room while observing appropriate contact time as indicated for disinfecting solutions.    HPI Pt presents for f/u of Urgent care appt /uti  Wt Readings from Last 3 Encounters:  04/10/20 107 lb 3 oz (48.6 kg)  02/07/20 107 lb 14.4 oz (48.9 kg)  10/21/19 111 lb 6 oz (50.5 kg)   18.40 kg/m She was [facility] at [facility] in [place]  For cystitis on 10/11 ua at that time showed 3 plus blood and 2 plus leukocytes  Culture grew out e coli-pan sensitive  She was tx with keflex and then developed confusion and dizziness  This was replaced with cipro (never was called in)  We repl with bactrim ds   Feeling ok  Still some dizziness  Urinary symptoms are better  No blood  No pain to urinate  Drinking lots of water  No nausea/fever   H/o frequent utis  Give estrace vag cream orig from urology  Lab Results  Component Value Date   CREATININE 0.56 10/10/2019   BUN 11 10/10/2019   NA 138 10/10/2019   K 3.9 10/10/2019   CL 98 10/10/2019   CO2 32 10/10/2019   Patient Active Problem List   Diagnosis Date Noted   Fatigue 05/30/2019   Onychomycosis of toenail 03/02/2019   Urethral caruncle 12/10/2018   Acute cystitis 11/29/2018   Elevated random blood glucose level 08/25/2018   Medicare annual wellness visit, subsequent 08/25/2018   Loose stools 06/28/2018   Internal hemorrhoid, bleeding 04/20/2018   Corn of toe 04/20/2018   Ingrown nail of great toe of right foot 11/05/2017   Routine general medical examination at a health care facility 08/07/2016   Frequent UTI 02/16/2015   Epistaxis, recurrent 02/28/2014   History of shingles 02/20/2014   Hearing loss  09/23/2007   History of melanoma 02/25/2007   Hyperlipidemia 02/25/2007   MITRAL VALVE PROLAPSE 02/25/2007   ALLERGIC RHINITIS 02/25/2007   DEGENERATIVE DISC DISEASE, LUMBOSACRAL SPINE 02/25/2007   Age related osteoporosis 02/25/2007   HYPERTENSION, BENIGN 10/26/2006   Past Medical History:  Diagnosis Date   Allergy    Asthma    years ago, 2-3 times a year pt experiences a problem with breathing.   Bursitis, hip    Family history of adverse reaction to anesthesia    DAUGHTER had a reaction to ansethesia   Hyperlipidemia    Hypertension    HYPERTENSION, BENIGN 10/26/2006   Qualifier: Diagnosis of  By: Selinda Orion     MITRAL VALVE PROLAPSE 02/25/2007   Qualifier: History of  By: Marcelino Scot CMA, [place] Bilberry     Osteopenia 08/01   Osteoporosis    Skin cancer 2015   nose   Past Surgical History:  Procedure Laterality Date   ABDOMINAL HYSTERECTOMY  1971   BREAST BIOPSY Right 06/99   fibrocystic   CYSTOSCOPY WITH BIOPSY N/A 05/31/2015   Procedure: CYSTOSCOPY WITH BIOPSY/ fulgeration;  Surgeon: Hollice Espy, MD;  Location: [facility] ORS;  Service: Urology;  Laterality: N/A;   DENTAL SURGERY     EYE SURGERY Bilateral 2011   June and August of 2011   ORIF RADIAL FRACTURE Left 11/99   arm fracture, radial no surgery  TONSILLECTOMY     Social History   Tobacco Use   Smoking status: Never Smoker   Smokeless tobacco: Never Used  Vaping Use   Vaping Use: Never used  Substance Use Topics   Alcohol use: No    Alcohol/week: 0.0 standard drinks   Drug use: No   Family History  Problem Relation Age of Onset   Leukemia Sister    Cancer Sister        leukemia   Heart disease Mother    Diabetes Mother    Heart failure Mother    Stroke Father    Kidney disease Neg Hx    Kidney cancer Neg Hx    Bladder Cancer Neg Hx    Allergies  Allergen Reactions   Clindamycin/Lincomycin Swelling    Throat swelling   Codeine     Unknown per pt     Keflex [Cephalexin]     dizzy   Levofloxacin     Unknown per pt    Rofecoxib     Unknown per pt    Valtrex [Valacyclovir Hcl]     Unknown per pt    Adhesive [Tape] Rash   Ciprofloxacin Diarrhea    Can take with imodium    Nitrofurantoin Nausea Only   Penicillins Swelling    mouth swelling   Raloxifene Rash   Tramadol     dizzy   Current Outpatient Medications on File Prior to Visit  Medication Sig Dispense Refill   Alum Hydroxide-Mag Carbonate (GAVISCON EXTRA STRENGTH) 160-105 MG CHEW Chew 1 tablet by mouth daily as needed (for heartburn).      atenolol (TENORMIN) 50 MG tablet Take 1 tablet (50 mg total) by mouth daily. 90 tablet 3   AYR SALINE NASAL NA Place 1 spray into the nose daily as needed (allergies).      clotrimazole-betamethasone (LOTRISONE) cream Apply 1 application topically as needed.     estradiol (ESTRACE) 0.1 MG/GM vaginal cream Vaginal Estrogen Cream apply a blueberry sized amount to vaginal opening using finger tip 3 times weekly before bed. 42.5 g 6   fluorometholone (FML) 0.1 % ophthalmic suspension Place 1 drop into both eyes 2 (two) times daily.     fluticasone (FLONASE) 50 MCG/ACT nasal spray Place 2 sprays into both nostrils daily. 16 g 11   hydrocortisone (ANUSOL-HC) 25 MG suppository Place 1 suppository (25 mg total) rectally 2 (two) times daily as needed for hemorrhoids or anal itching. 12 suppository 1   loperamide (IMODIUM) 2 MG capsule Take 2 mg by mouth as needed for diarrhea or loose stools (has been taking with Clindamycin).     loratadine (CLARITIN) 10 MG tablet Take 10 mg by mouth as needed for allergies or rhinitis.      Multiple Vitamin (MULTIVITAMIN) capsule Take 1 capsule by mouth daily.     simvastatin (ZOCOR) 20 MG tablet Take 1 tablet (20 mg total) by mouth daily. 90 tablet 3   sulfamethoxazole-trimethoprim (BACTRIM DS) 800-160 MG tablet Take 1 tablet by mouth 2 (two) times daily. 10 tablet 0   No current  facility-administered medications on file prior to visit.    Review of Systems  Constitutional: Negative for activity change, appetite change, fatigue, fever and unexpected weight change.  HENT: Negative for congestion, ear pain, rhinorrhea, sinus pressure and sore throat.   Eyes: Negative for pain, redness and visual disturbance.  Respiratory: Negative for cough, shortness of breath and wheezing.   Cardiovascular: Negative for chest pain and palpitations.  Gastrointestinal: Negative  for abdominal pain, blood in stool, constipation and diarrhea.  Endocrine: Negative for polydipsia and polyuria.  Genitourinary: Negative for dysuria, frequency, hematuria and urgency.  Musculoskeletal: Negative for arthralgias, back pain and myalgias.  Skin: Negative for pallor and rash.  Allergic/Immunologic: Negative for environmental allergies.  Neurological: Positive for dizziness. Negative for tremors, seizures, syncope, speech difficulty, weakness, numbness and headaches.  Hematological: Negative for adenopathy. Does not bruise/bleed easily.  Psychiatric/Behavioral: Negative for decreased concentration and dysphoric mood. The patient is not nervous/anxious.        Baseline short term memory loss       Objective:   Physical Exam Constitutional:      General: She is not in acute distress.    Appearance: Normal appearance. She is well-developed and normal weight. She is not ill-appearing or diaphoretic.  HENT:     Head: Normocephalic and atraumatic.     Right Ear: Tympanic membrane, ear canal and external ear normal.     Left Ear: Tympanic membrane, ear canal and external ear normal.  Eyes:     General: No scleral icterus.    Conjunctiva/sclera: Conjunctivae normal.     Pupils: Pupils are equal, round, and reactive to light.     Comments: Few beats of horiz nystagmus bilat  Neck:     Thyroid: No thyromegaly.     Vascular: No carotid bruit or JVD.  Cardiovascular:     Rate and Rhythm: Normal  rate and regular rhythm.     Heart sounds: Normal heart sounds. No gallop.   Pulmonary:     Effort: Pulmonary effort is normal. No respiratory distress.     Breath sounds: Normal breath sounds. No wheezing or rales.  Abdominal:     General: Bowel sounds are normal. There is no distension or abdominal bruit.     Palpations: Abdomen is soft. There is no mass.     Tenderness: There is no abdominal tenderness. There is no right CVA tenderness or left CVA tenderness.     Comments: No suprapubic tenderness or fullness    Musculoskeletal:     Cervical back: Normal range of motion and neck supple.     Right lower leg: No edema.     Left lower leg: No edema.  Lymphadenopathy:     Cervical: No cervical adenopathy.  Skin:    General: Skin is warm and dry.     Findings: No rash.  Neurological:     Mental Status: She is alert.     Sensory: No sensory deficit.     Motor: No weakness.     Gait: Gait normal.     Deep Tendon Reflexes: Reflexes are normal and symmetric.  Psychiatric:        Mood and Affect: Mood normal.           Assessment & Plan:   Problem List Items Addressed This Visit      Genitourinary   Acute cystitis - Primary    Recent uti caused confusion/dysuria and hematuria  Rev note and labs from urgent care  E coli- pan sensitive Did not tolerate keflex-dizziness /sick  Was going to change to cipro/pharm would not fill Now on bactrim  Symptoms are gone except mild dizziness Disc fall prev and also need for hydration  Update if not starting to improve in a week or if worsening          Other   Dizziness    Acute on chronic  Worse with uti and last abx  Improving now  Few beats or horiz nystagmus bilat today  Enc to continue flonase to avoid congestion  Update if not starting to improve in a week or if worsening

## 2020-04-10 NOTE — Patient Instructions (Addendum)
I'm glad you are doing better  Keep drinking water  If dizziness does now improve let us know  This cough be from antibiotics or a little inner ear change   Continue nasal spray   If uti symptoms return (especially blood or confusion) please let us know

## 2020-04-13 ENCOUNTER — Telehealth: Payer: Self-pay

## 2020-04-13 MED ORDER — CIPROFLOXACIN HCL 250 MG PO TABS
250.0000 mg | ORAL_TABLET | Freq: Two times a day (BID) | ORAL | 0 refills | Status: DC
Start: 2020-04-13 — End: 2020-04-19

## 2020-04-13 NOTE — Telephone Encounter (Signed)
Unsure if this is a med reaction to bactrim.  Unfortunately she is allergic to /intolerant to almost all antibiotics  Cipro is listed as something that gives her diarrhea but she can take to immodium If she wants to change -please send cipro 250 mg bid #6 no  ref to her pharmacy  Unsure what to do regarding the ? Discharge Please keep f/u appt If worse over wkend go to UC I am out of the office today

## 2020-04-13 NOTE — Telephone Encounter (Signed)
[place] Day - Client TELEPHONE ADVICE RECORD [facility] Patient Name: Paula Jimenez Gender: Female DOB: 1927/02/07 Age: 86 Y 88 M 13 D Return Phone Number: [phone] (Secondary) Address: [place]/State/Zip: [place] [address] Client [facility] Day - Client Client [facility] Physician Tower, Roque Lias - MD Contact Type Call Who Is Calling Patient / Member / Family / Caregiver Call Type Triage / Clinical Relationship To Patient Self Return Phone Number [phone] (Secondary) Chief Complaint Dizziness Reason for Call Symptomatic / Request for [place] states she almost passed out yesterday and she is dizzy today. She feels like stuff is going to fall out of her. Translation No Nurse Assessment Nurse: Thad Ranger, RN, Denise Date/Time (Eastern Time): 04/13/2020 12:27:00 PM Confirm and document reason for call. If symptomatic, describe symptoms. ---Pt has a UTI and is allergic to Sulfa. She was seen by MD on 04/10/20. She got very dizzy yesterday and stopped the med last night. She is confused and has vag discharge and irritation. She has had a UTI unresolved since 04/02/20. Does the patient have any new or worsening symptoms? ---Yes Will a triage be completed? ---Yes Related visit to physician within the last 2 weeks? ---Yes Does the PT have any chronic conditions? (i.e. diabetes, asthma, this includes High risk factors for pregnancy, etc.) ---No Is this a behavioral health or substance abuse call? ---No Guidelines Guideline Title Affirmed Question Affirmed Notes Nurse Date/Time (Eastern Time) Urinary Tract Infection on Antibiotic Follow-up Call - Female Patient sounds very sick or weak to the triager Thad Ranger, RN, [facility] 04/13/2020 12:29:39 PM Disp. Time Eilene Ghazi Time) Disposition Final User 04/13/2020 12:34:20 PM Go to ED Now (or PCP triage)  Yes Carmon, RN, Yevette Edwards Disagree/Comply Comply Caller Understands Yes PLEASE NOTE: All timestamps contained within this report are represented as [place] Standard Time. CONFIDENTIALTY NOTICE: This fax transmission is intended only for the addressee. It contains information that is legally privileged, confidential or otherwise protected from use or disclosure. If you are not the intended recipient, you are strictly prohibited from reviewing, disclosing, copying using or disseminating any of this information or taking any action in reliance on or regarding this information. If you have received this fax in error, please notify us immediately by telephone so that we can arrange for its return to [place]. Phone: [phone], Toll-Free: [phone], Fax: [phone] Page: 2 of 2 Call Id: [id number] PreDisposition Call Doctor Care Advice Given Per Guideline GO TO ED NOW (OR PCP TRIAGE): BRING MEDICINES: * Bring a list of your current medicines when you go to the Emergency Department (ER). CARE ADVICE given per Urinary Tract Infection on Antibiotic Follow-Up Call, Female (Adult) guideline. Comments User: Romeo Apple, RN Date/Time Eilene Ghazi Time): 04/13/2020 12:34:18 PM Pt confused and cont to have unresolved UTI since 04/02/20 w/2 failed po abx. Pcg refuses to take her to the ER and wants appt in office. Referrals REFERRED TO PCP OFFICE

## 2020-04-13 NOTE — Telephone Encounter (Signed)
Pt notified Rx sent to pharmacy, she has imodium so she will take that with abx, pt advised of Dr. Marliss Coots comments and recommendations and verbalized understanding

## 2020-04-13 NOTE — Telephone Encounter (Signed)
I spoke with pt; pt had visit on 04/10/20;pt did not finish abx;last abx taken was 04/12/20 in AM. About 1 hour after taken abx on 04/12/20 pt ate some jelly toast; pt felt weak and got hot feeling. Pt rested and cooled off and dizziness and hot feeling got better. Today pt felt like something came out of her; pt has no pain today but felt like something was coming out of her bottom; not sure if yeast infection or UTI. Pt having slight yellowish discharge today. pts daughter has already scheduled appt with DR Tower on 04/19/20 at 9 AM. [facility].UC precautions given and pt voiced understanding. FYI to Dr Glori Bickers.

## 2020-04-19 ENCOUNTER — Other Ambulatory Visit: Payer: Self-pay

## 2020-04-19 ENCOUNTER — Ambulatory Visit (INDEPENDENT_AMBULATORY_CARE_PROVIDER_SITE_OTHER): Payer: Medicare HMO | Admitting: Family Medicine

## 2020-04-19 ENCOUNTER — Encounter: Payer: Self-pay | Admitting: Family Medicine

## 2020-04-19 VITALS — BP 136/78 | HR 76 | Temp 96.9°F | Ht 64.0 in | Wt 107.3 lb

## 2020-04-19 DIAGNOSIS — N3001 Acute cystitis with hematuria: Secondary | ICD-10-CM

## 2020-04-19 DIAGNOSIS — N898 Other specified noninflammatory disorders of vagina: Secondary | ICD-10-CM

## 2020-04-19 DIAGNOSIS — R102 Pelvic and perineal pain unspecified side: Secondary | ICD-10-CM | POA: Insufficient documentation

## 2020-04-19 DIAGNOSIS — R8290 Unspecified abnormal findings in urine: Secondary | ICD-10-CM

## 2020-04-19 DIAGNOSIS — N390 Urinary tract infection, site not specified: Secondary | ICD-10-CM

## 2020-04-19 LAB — POC URINALSYSI DIPSTICK (AUTOMATED)
Bilirubin, UA: NEGATIVE
Blood, UA: 25
Glucose, UA: NEGATIVE
Ketones, UA: NEGATIVE
Nitrite, UA: NEGATIVE
Protein, UA: NEGATIVE
Spec Grav, UA: 1.02 (ref 1.010–1.025)
Urobilinogen, UA: 0.2 E.U./dL
pH, UA: 5.5 (ref 5.0–8.0)

## 2020-04-19 LAB — POCT WET PREP (WET MOUNT): Trichomonas Wet Prep HPF POC: ABSENT

## 2020-04-19 NOTE — Assessment & Plan Note (Signed)
Recent uti -tx with bactrim (adv eff) ten changed to cipro  No symptoms  cx pend (still leuk and rbc on dip)

## 2020-04-19 NOTE — Assessment & Plan Note (Signed)
Small cystocele noted on exam  ? If symptoms were prev from uti  Better now  Wet prep neg for signs of vaginitis Urged pt to continue estrce cream and f/u with urology

## 2020-04-19 NOTE — Progress Notes (Signed)
Subjective:    Patient ID: Paula Jimenez, female    DOB: May 06, 1927, 86 y.o.   MRN: [id number]  This visit occurred during the SARS-CoV-2 public health emergency.  Safety protocols were in place, including screening questions prior to the visit, additional usage of staff PPE, and extensive cleaning of exam room while observing appropriate contact time as indicated for disinfecting solutions.    HPI Pt presents with vaginal symptoms and also reaction to sulfa abx  Wt Readings from Last 3 Encounters:  04/19/20 107 lb 5 oz (48.7 kg)  04/10/20 107 lb 3 oz (48.6 kg)  02/07/20 107 lb 14.4 oz (48.9 kg)   18.42 kg/m  Last visit 10/19 seen for uti  Originally seen by UC - given keflex and then developed confusion /dizziness  Planned to replace with cipro but it was never called in  We repl this with bacrim ds  (so we changed to cipro) The bactrm gave her feeling of heat/pre syncopal  - and unable to eat (poor appetite)  Intol to mamy medications  H/o freq utis  Given estrace cream by urology    Then felt like her pelvic organs were going to fall out  A little bit of yellow d/c  No itch or irritation  Quit using soap/uses water only   Maybe slt discomfort at beginning of urination   In past- used girdle and supp hose   Results for orders placed or performed in visit on 04/19/20  POCT Wet Prep [employer])  Result Value Ref Range   Source Wet Prep POC vaginal    WBC, Wet Prep HPF POC one    Bacteria Wet Prep HPF POC None (A) Few   BACTERIA WET PREP MORPHOLOGY POC     Clue Cells Wet Prep HPF POC None None   Clue Cells Wet Prep Whiff POC     Yeast Wet Prep HPF POC None None   KOH Wet Prep POC None None   Trichomonas Wet Prep HPF POC Absent Absent  POCT Urinalysis Dipstick (Automated)  Result Value Ref Range   Color, UA Yellow    Clarity, UA Clear    Glucose, UA Negative Negative   Bilirubin, UA Negative    Ketones, UA Negative    Spec Grav, UA 1.020 1.010 - 1.025    Blood, UA 25 Ery/uL    pH, UA 5.5 5.0 - 8.0   Protein, UA Negative Negative   Urobilinogen, UA 0.2 0.2 or 1.0 E.U./dL   Nitrite, UA Negative    Leukocytes, UA Small (1+) (A) Negative    Patient Active Problem List   Diagnosis Date Noted  . Vaginal discharge 04/19/2020  . Pelvic pressure in female 04/19/2020  . Fatigue 05/30/2019  . Onychomycosis of toenail 03/02/2019  . Urethral caruncle 12/10/2018  . Acute cystitis 11/29/2018  . Elevated random blood glucose level 08/25/2018  . Medicare annual wellness visit, subsequent 08/25/2018  . Loose stools 06/28/2018  . Internal hemorrhoid, bleeding 04/20/2018  . Corn of toe 04/20/2018  . Ingrown nail of great toe of right foot 11/05/2017  . Routine general medical examination at a health care facility 08/07/2016  . Frequent UTI 02/16/2015  . Epistaxis, recurrent 02/28/2014  . History of shingles 02/20/2014  . Dizziness 01/06/2014  . Hearing loss 09/23/2007  . History of melanoma 02/25/2007  . Hyperlipidemia 02/25/2007  . MITRAL VALVE PROLAPSE 02/25/2007  . ALLERGIC RHINITIS 02/25/2007  . DEGENERATIVE DISC DISEASE, LUMBOSACRAL SPINE 02/25/2007  . Age related osteoporosis  02/25/2007  . HYPERTENSION, BENIGN 10/26/2006   Past Medical History:  Diagnosis Date  . Allergy   . Asthma    years ago, 2-3 times a year pt experiences a problem with breathing.  . Bursitis, hip   . Family history of adverse reaction to anesthesia    DAUGHTER had a reaction to ansethesia  . Hyperlipidemia   . Hypertension   . HYPERTENSION, BENIGN 10/26/2006   Qualifier: Diagnosis of  By: Selinda Orion    . MITRAL VALVE PROLAPSE 02/25/2007   Qualifier: History of  By: Marcelino Scot CMA, [place]    . Osteopenia 08/01  . Osteoporosis   . Skin cancer 2015   nose   Past Surgical History:  Procedure Laterality Date  . ABDOMINAL HYSTERECTOMY  1971  . BREAST BIOPSY Right 06/99   fibrocystic  . CYSTOSCOPY WITH BIOPSY N/A 05/31/2015   Procedure: CYSTOSCOPY WITH  BIOPSY/ fulgeration;  Surgeon: Hollice Espy, MD;  Location: [facility] ORS;  Service: Urology;  Laterality: N/A;  . DENTAL SURGERY    . EYE SURGERY Bilateral 2011   June and August of 2011  . ORIF RADIAL FRACTURE Left 11/99   arm fracture, radial no surgery  . TONSILLECTOMY     Social History   Tobacco Use  . Smoking status: Never Smoker  . Smokeless tobacco: Never Used  Vaping Use  . Vaping Use: Never used  Substance Use Topics  . Alcohol use: No    Alcohol/week: 0.0 standard drinks  . Drug use: No   Family History  Problem Relation Age of Onset  . Leukemia Sister   . Cancer Sister        leukemia  . Heart disease Mother   . Diabetes Mother   . Heart failure Mother   . Stroke Father   . Kidney disease Neg Hx   . Kidney cancer Neg Hx   . Bladder Cancer Neg Hx    Allergies  Allergen Reactions  . Clindamycin/Lincomycin Swelling    Throat swelling  . Codeine     Unknown per pt   . Keflex [Cephalexin]     dizzy  . Levofloxacin     Unknown per pt   . Rofecoxib     Unknown per pt   . Sulfa Antibiotics   . Valtrex [Valacyclovir Hcl]     Unknown per pt   . Adhesive [Tape] Rash  . Ciprofloxacin Diarrhea    Can take with imodium   . Nitrofurantoin Nausea Only  . Penicillins Swelling    mouth swelling  . Raloxifene Rash  . Tramadol     dizzy   Current Outpatient Medications on File Prior to Visit  Medication Sig Dispense Refill  . Alum Hydroxide-Mag Carbonate (GAVISCON EXTRA STRENGTH) 160-105 MG CHEW Chew 1 tablet by mouth daily as needed (for heartburn).     [name] atenolol (TENORMIN) 50 MG tablet Take 1 tablet (50 mg total) by mouth daily. 90 tablet 3  . AYR SALINE NASAL NA Place 1 spray into the nose daily as needed (allergies).     . clotrimazole-betamethasone (LOTRISONE) cream Apply 1 application topically as needed.    [name] estradiol (ESTRACE) 0.1 MG/GM vaginal cream Vaginal Estrogen Cream apply a blueberry sized amount to vaginal opening using finger tip 3 times  weekly before bed. 42.5 g 6  . fluorometholone (FML) 0.1 % ophthalmic suspension Place 1 drop into both eyes 2 (two) times daily.    . fluticasone (FLONASE) 50 MCG/ACT nasal spray Place 2 sprays  into both nostrils daily. 16 g 11  . hydrocortisone (ANUSOL-HC) 25 MG suppository Place 1 suppository (25 mg total) rectally 2 (two) times daily as needed for hemorrhoids or anal itching. 12 suppository 1  . loperamide (IMODIUM) 2 MG capsule Take 2 mg by mouth as needed for diarrhea or loose stools (has been taking with Clindamycin).    [name] loratadine (CLARITIN) 10 MG tablet Take 10 mg by mouth as needed for allergies or rhinitis.     . Multiple Vitamin (MULTIVITAMIN) capsule Take 1 capsule by mouth daily.    . simvastatin (ZOCOR) 20 MG tablet Take 1 tablet (20 mg total) by mouth daily. 90 tablet 3   No current facility-administered medications on file prior to visit.    Review of Systems  Constitutional: Negative for activity change, appetite change, fatigue, fever and unexpected weight change.  HENT: Negative for congestion, ear pain, rhinorrhea, sinus pressure and sore throat.   Eyes: Negative for pain, redness and visual disturbance.  Respiratory: Negative for cough, shortness of breath and wheezing.   Cardiovascular: Negative for chest pain and palpitations.  Gastrointestinal: Negative for abdominal pain, blood in stool, constipation and diarrhea.  Endocrine: Negative for polydipsia and polyuria.  Genitourinary: Positive for frequency and vaginal discharge. Negative for dysuria, urgency and vaginal bleeding.  Musculoskeletal: Negative for arthralgias, back pain and myalgias.  Skin: Negative for pallor and rash.  Allergic/Immunologic: Negative for environmental allergies.  Neurological: Negative for dizziness, syncope and headaches.  Hematological: Negative for adenopathy. Does not bruise/bleed easily.  Psychiatric/Behavioral: Negative for decreased concentration and dysphoric mood. The patient is  not nervous/anxious.        Objective:   Physical Exam Constitutional:      General: She is not in acute distress.    Appearance: Normal appearance. She is well-developed and normal weight. She is not ill-appearing or diaphoretic.  HENT:     Head: Normocephalic and atraumatic.  Eyes:     Conjunctiva/sclera: Conjunctivae normal.     Pupils: Pupils are equal, round, and reactive to light.  Neck:     Thyroid: No thyromegaly.     Vascular: No carotid bruit or JVD.  Cardiovascular:     Rate and Rhythm: Normal rate and regular rhythm.     Heart sounds: Normal heart sounds. No gallop.   Pulmonary:     Effort: Pulmonary effort is normal. No respiratory distress.     Breath sounds: Normal breath sounds. No wheezing or rales.  Abdominal:     General: Bowel sounds are normal. There is no distension or abdominal bruit.     Palpations: Abdomen is soft. There is no mass.     Tenderness: There is no abdominal tenderness. There is no right CVA tenderness or left CVA tenderness.     Comments: No suprapubic tenderness or fullness  No cva tenderness   Genitourinary:    Comments: Nl ext genitalia Nl vaginal mucosa (wet prep neg) No discomfort or mass on bimanual exam  Small cystocele noted  Musculoskeletal:     Cervical back: Normal range of motion and neck supple.     Right lower leg: No edema.     Left lower leg: No edema.  Lymphadenopathy:     Cervical: No cervical adenopathy.  Skin:    General: Skin is warm and dry.     Coloration: Skin is not pale.     Findings: No erythema or rash.  Neurological:     Mental Status: She is alert.  Sensory: No sensory deficit.     Coordination: Coordination normal.     Deep Tendon Reflexes: Reflexes are normal and symmetric.  Psychiatric:        Mood and Affect: Mood normal.           Assessment & Plan:   Problem List Items Addressed This Visit      Genitourinary   Frequent UTI    Still small leuk and blood on urine dip  Pend cx   Clinical improvement      Relevant Orders   POCT Urinalysis Dipstick (Automated) (Completed)   Urine Culture   Acute cystitis    Recent uti -tx with bactrim (adv eff) ten changed to cipro  No symptoms  cx pend (still leuk and rbc on dip)        Other   Vaginal discharge    Per pt -this occurred briefly after abx then stopped  Neg wet prep today      Relevant Orders   POCT Wet Prep [facility]) (Completed)   Pelvic pressure in female - Primary    Small cystocele noted on exam  ? If symptoms were prev from uti  Better now  Wet prep neg for signs of vaginitis Urged pt to continue estrce cream and f/u with urology      Relevant Orders   POCT Urinalysis Dipstick (Automated) (Completed)    Other Visit Diagnoses    Abnormal urinalysis       Relevant Orders   Urine Culture

## 2020-04-19 NOTE — Assessment & Plan Note (Signed)
Per pt -this occurred briefly after abx then stopped  Neg wet prep today

## 2020-04-19 NOTE — Patient Instructions (Signed)
Vaginal swab is negative for yeast or other abnormality   You may have mild bladder prolapse  Continue using the estrace cream   Exam is reassuring  Keep drinking fluids   We will re check urinalysis today / culture and update you

## 2020-04-19 NOTE — Assessment & Plan Note (Signed)
Still small leuk and blood on urine dip  Pend cx  Clinical improvement

## 2020-04-20 LAB — URINE CULTURE
MICRO NUMBER:: 11131423
Result:: NO GROWTH
SPECIMEN QUALITY:: ADEQUATE

## 2020-07-23 ENCOUNTER — Ambulatory Visit: Payer: Medicare HMO | Admitting: Podiatry

## 2020-07-30 ENCOUNTER — Ambulatory Visit: Payer: Medicare HMO | Admitting: Podiatry

## 2020-08-27 ENCOUNTER — Other Ambulatory Visit: Payer: Self-pay

## 2020-08-27 ENCOUNTER — Encounter: Payer: Self-pay | Admitting: Podiatry

## 2020-08-27 ENCOUNTER — Ambulatory Visit: Payer: Medicare HMO | Admitting: Podiatry

## 2020-08-27 DIAGNOSIS — B351 Tinea unguium: Secondary | ICD-10-CM | POA: Diagnosis not present

## 2020-08-27 DIAGNOSIS — M79609 Pain in unspecified limb: Secondary | ICD-10-CM | POA: Diagnosis not present

## 2020-08-27 NOTE — Progress Notes (Signed)
Complaint:  Visit Type: Patient returns to my office for continued preventative foot care services. Complaint: Patient states" my nails have grown long and thick and become painful to walk and wear shoes" Patient has not been seen in over 6  months.. The patient presents for preventative foot care services. No changes to ROS  Podiatric Exam: Vascular: dorsalis pedis and posterior tibial pulses are weakly  palpable bilateral. Capillary return is immediate. Cold feet.  Absent digital hair.. Skin turgor WNL  Sensorium: Normal Semmes Weinstein monofilament test. Normal tactile sensation bilaterally. Nail Exam: Pt has thick disfigured discolored nails with subungual debris noted bilateral entire nail hallux through fifth toenails Ulcer Exam: There is no evidence of ulcer or pre-ulcerative changes or infection. Orthopedic Exam: Muscle tone and strength are WNL. No limitations in general ROM. No crepitus or effusions noted. Foot type and digits show no abnormalities. Bony prominences are unremarkable. Skin: No Porokeratosis. No infection or ulcers  Diagnosis:  Onychomycosis, , Pain in right toe, pain in left toes  Treatment & Plan Procedures and Treatment: Consent by patient was obtained for treatment procedures.   Debridement of mycotic and hypertrophic toenails, 1 through 5 bilateral and clearing of subungual debris. No ulceration, no infection noted.  Return Visit-Office Procedure: Patient instructed to return to the office for a follow up visit 6 months for continued evaluation and treatment.    Gardiner Barefoot DPM

## 2020-09-11 DIAGNOSIS — Z9842 Cataract extraction status, left eye: Secondary | ICD-10-CM | POA: Diagnosis not present

## 2020-09-11 DIAGNOSIS — Z9841 Cataract extraction status, right eye: Secondary | ICD-10-CM | POA: Diagnosis not present

## 2020-09-11 DIAGNOSIS — H52223 Regular astigmatism, bilateral: Secondary | ICD-10-CM | POA: Diagnosis not present

## 2020-09-21 ENCOUNTER — Ambulatory Visit (INDEPENDENT_AMBULATORY_CARE_PROVIDER_SITE_OTHER): Payer: Medicare HMO | Admitting: Family Medicine

## 2020-09-21 ENCOUNTER — Other Ambulatory Visit: Payer: Self-pay

## 2020-09-21 ENCOUNTER — Encounter: Payer: Self-pay | Admitting: Family Medicine

## 2020-09-21 VITALS — BP 134/78 | HR 72 | Temp 97.8°F | Ht 64.0 in | Wt 108.0 lb

## 2020-09-21 DIAGNOSIS — N3001 Acute cystitis with hematuria: Secondary | ICD-10-CM | POA: Diagnosis not present

## 2020-09-21 DIAGNOSIS — R300 Dysuria: Secondary | ICD-10-CM

## 2020-09-21 LAB — POC URINALSYSI DIPSTICK (AUTOMATED)
Bilirubin, UA: 1
Blood, UA: POSITIVE
Glucose, UA: NEGATIVE
Ketones, UA: NEGATIVE
Nitrite, UA: NEGATIVE
Protein, UA: POSITIVE — AB
Spec Grav, UA: 1.015 (ref 1.010–1.025)
Urobilinogen, UA: 1 E.U./dL
pH, UA: 6.5 (ref 5.0–8.0)

## 2020-09-21 MED ORDER — CIPROFLOXACIN HCL 250 MG PO TABS: 250.0000 mg | ORAL_TABLET | Freq: Two times a day (BID) | ORAL | 0 refills | Status: DC

## 2020-09-21 NOTE — Progress Notes (Signed)
Subjective:    Patient ID: Paula Jimenez, female    DOB: 01/17/1927, 87 y.o.   MRN: [id number]  This visit occurred during the SARS-CoV-2 public health emergency.  Safety protocols were in place, including screening questions prior to the visit, additional usage of staff PPE, and extensive cleaning of exam room while observing appropriate contact time as indicated for disinfecting solutions.    HPI Pt presents for urinary symptoms   Wt Readings from Last 3 Encounters:  09/21/20 108 lb (49 kg)  04/19/20 107 lb 5 oz (48.7 kg)  04/10/20 107 lb 3 oz (48.6 kg)   18.54 kg/m  Pt has h/o a cystocele and also utis  Yesterday morning bad urinary pain /bladder  Also hurts/burns to urinate  Some urgency and frequency and incontinence   No fever or n/v    Ua: Results for orders placed or performed in visit on 09/21/20  POCT Urinalysis Dipstick (Automated)  Result Value Ref Range   Color, UA yellow    Clarity, UA dark    Glucose, UA Negative Negative   Bilirubin, UA 1    Ketones, UA neg    Spec Grav, UA 1.015 1.010 - 1.025   Blood, UA pos    pH, UA 6.5 5.0 - 8.0   Protein, UA Positive (A) Negative   Urobilinogen, UA 1.0 0.2 or 1.0 E.U./dL   Nitrite, UA neg    Leukocytes, UA Large (3+) (A) Negative    Trying to drink more and more water   Intolerant to multiple medicines  Last time we had to settle with cipro and that worked fine w/o side effects   Lab Results  Component Value Date   CREATININE 0.56 10/10/2019   BUN 11 10/10/2019   NA 138 10/10/2019   K 3.9 10/10/2019   CL 98 10/10/2019   CO2 32 10/10/2019   Patient Active Problem List   Diagnosis Date Noted  . Vaginal discharge 04/19/2020  . Pelvic pressure in female 04/19/2020  . Fatigue 05/30/2019  . Onychomycosis of toenail 03/02/2019  . Urethral caruncle 12/10/2018  . Acute cystitis 11/29/2018  . Elevated random blood glucose level 08/25/2018  . Medicare annual wellness visit, subsequent 08/25/2018  .  Loose stools 06/28/2018  . Internal hemorrhoid, bleeding 04/20/2018  . Corn of toe 04/20/2018  . Ingrown nail of great toe of right foot 11/05/2017  . Routine general medical examination at a health care facility 08/07/2016  . Frequent UTI 02/16/2015  . Epistaxis, recurrent 02/28/2014  . History of shingles 02/20/2014  . Dizziness 01/06/2014  . Hearing loss 09/23/2007  . History of melanoma 02/25/2007  . Hyperlipidemia 02/25/2007  . MITRAL VALVE PROLAPSE 02/25/2007  . ALLERGIC RHINITIS 02/25/2007  . DEGENERATIVE DISC DISEASE, LUMBOSACRAL SPINE 02/25/2007  . Age related osteoporosis 02/25/2007  . HYPERTENSION, BENIGN 10/26/2006   Past Medical History:  Diagnosis Date  . Allergy   . Asthma    years ago, 2-3 times a year pt experiences a problem with breathing.  . Bursitis, hip   . Family history of adverse reaction to anesthesia    DAUGHTER had a reaction to ansethesia  . Hyperlipidemia   . Hypertension   . HYPERTENSION, BENIGN 10/26/2006   Qualifier: Diagnosis of  By: Selinda Orion    . MITRAL VALVE PROLAPSE 02/25/2007   Qualifier: History of  By: Marcelino Scot CMA, [place] Bilberry    . Osteopenia 08/01  . Osteoporosis   . Skin cancer 2015   nose   Past  Surgical History:  Procedure Laterality Date  . ABDOMINAL HYSTERECTOMY  1971  . BREAST BIOPSY Right 06/99   fibrocystic  . CYSTOSCOPY WITH BIOPSY N/A 05/31/2015   Procedure: CYSTOSCOPY WITH BIOPSY/ fulgeration;  Surgeon: Hollice Espy, MD;  Location: [facility];  Service: Urology;  Laterality: N/A;  . DENTAL SURGERY    . EYE SURGERY Bilateral 2011   June and August of 2011  . ORIF RADIAL FRACTURE Left 11/99   arm fracture, radial no surgery  . TONSILLECTOMY     Social History   Tobacco Use  . Smoking status: Never Smoker  . Smokeless tobacco: Never Used  Vaping Use  . Vaping Use: Never used  Substance Use Topics  . Alcohol use: No    Alcohol/week: 0.0 standard drinks  . Drug use: No   Family History  Problem Relation Age  of Onset  . Leukemia Sister   . Cancer Sister        leukemia  . Heart disease Mother   . Diabetes Mother   . Heart failure Mother   . Stroke Father   . Kidney disease Neg Hx   . Kidney cancer Neg Hx   . Bladder Cancer Neg Hx    Allergies  Allergen Reactions  . Clindamycin/Lincomycin Swelling    Throat swelling  . Codeine     Unknown per pt   . Keflex [Cephalexin]     dizzy  . Levofloxacin     Unknown per pt   . Rofecoxib     Unknown per pt   . Sulfa Antibiotics   . Valtrex [Valacyclovir Hcl]     Unknown per pt   . Adhesive [Tape] Rash  . Nitrofurantoin Nausea Only  . Penicillins Swelling    mouth swelling  . Raloxifene Rash  . Tramadol     dizzy   Current Outpatient Medications on File Prior to Visit  Medication Sig Dispense Refill  . Alum Hydroxide-Mag Carbonate 160-105 MG CHEW Chew 1 tablet by mouth daily as needed (for heartburn).     [name] atenolol (TENORMIN) 50 MG tablet Take 1 tablet (50 mg total) by mouth daily. 90 tablet 3  . AYR SALINE NASAL NA Place 1 spray into the nose daily as needed (allergies).     . clotrimazole-betamethasone (LOTRISONE) cream Apply 1 application topically as needed.    [name] estradiol (ESTRACE) 0.1 MG/GM vaginal cream Vaginal Estrogen Cream apply a blueberry sized amount to vaginal opening using finger tip 3 times weekly before bed. 42.5 g 6  . fluorometholone (FML) 0.1 % ophthalmic suspension Place 1 drop into both eyes 2 (two) times daily.    . fluticasone (FLONASE) 50 MCG/ACT nasal spray Place 2 sprays into both nostrils daily. 16 g 11  . hydrocortisone (ANUSOL-HC) 25 MG suppository Place 1 suppository (25 mg total) rectally 2 (two) times daily as needed for hemorrhoids or anal itching. 12 suppository 1  . loperamide (IMODIUM) 2 MG capsule Take 2 mg by mouth as needed for diarrhea or loose stools (has been taking with Clindamycin).    [name] loratadine (CLARITIN) 10 MG tablet Take 10 mg by mouth as needed for allergies or rhinitis.    [name]  simvastatin (ZOCOR) 20 MG tablet Take 1 tablet (20 mg total) by mouth daily. 90 tablet 3  . Multiple Vitamin (MULTIVITAMIN) capsule Take 1 capsule by mouth daily. (Patient not taking: Reported on 09/21/2020)     No current facility-administered medications on file prior to visit.     Review  of Systems  Constitutional: Negative for activity change, appetite change, fatigue, fever and unexpected weight change.  HENT: Negative for congestion, ear pain, rhinorrhea, sinus pressure and sore throat.   Eyes: Negative for pain, redness and visual disturbance.  Respiratory: Negative for cough, shortness of breath and wheezing.   Cardiovascular: Negative for chest pain and palpitations.  Gastrointestinal: Negative for abdominal pain, blood in stool, constipation and diarrhea.  Endocrine: Negative for polydipsia and polyuria.  Genitourinary: Positive for dysuria, frequency, hematuria and urgency. Negative for decreased urine volume and flank pain.  Musculoskeletal: Negative for arthralgias, back pain and myalgias.  Skin: Negative for pallor and rash.  Allergic/Immunologic: Negative for environmental allergies.  Neurological: Negative for dizziness, syncope and headaches.  Hematological: Negative for adenopathy. Does not bruise/bleed easily.  Psychiatric/Behavioral: Negative for decreased concentration and dysphoric mood. The patient is not nervous/anxious.        Objective:   Physical Exam Constitutional:      General: She is not in acute distress.    Appearance: Normal appearance. She is well-developed and normal weight.  HENT:     Head: Normocephalic and atraumatic.  Eyes:     Conjunctiva/sclera: Conjunctivae normal.     Pupils: Pupils are equal, round, and reactive to light.  Cardiovascular:     Rate and Rhythm: Normal rate and regular rhythm.     Heart sounds: Normal heart sounds.  Pulmonary:     Effort: Pulmonary effort is normal.     Breath sounds: Normal breath sounds.  Abdominal:      General: Bowel sounds are normal. There is no distension.     Palpations: Abdomen is soft.     Tenderness: There is abdominal tenderness. There is no right CVA tenderness, left CVA tenderness or rebound.     Comments: No cva tenderness  Mild suprapubic tenderness  Musculoskeletal:     Cervical back: Normal range of motion and neck supple.  Lymphadenopathy:     Cervical: No cervical adenopathy.  Skin:    Findings: No rash.  Neurological:     Mental Status: She is alert.  Psychiatric:        Mood and Affect: Mood normal.           Assessment & Plan:   Problem List Items Addressed This Visit      Genitourinary   Acute cystitis - Primary    In setting of more freq uti with age  tx with cipro (disc risks of this but other medicines are not well tolerated) for 5 d  Fluids/rest  Disc ER parameters  Will update with culture result  inst to call if worse in the meantime  Meds ordered this encounter  Medications  . ciprofloxacin (CIPRO) 250 MG tablet    Sig: Take 1 tablet (250 mg total) by mouth 2 (two) times daily.    Dispense:  10 tablet    Refill:  0         Relevant Orders   Urine Culture    Other Visit Diagnoses    Dysuria       Relevant Orders   POCT Urinalysis Dipstick (Automated) (Completed)

## 2020-09-21 NOTE — Assessment & Plan Note (Signed)
In setting of more freq uti with age  tx with cipro (disc risks of this but other medicines are not well tolerated) for 5 d  Fluids/rest  Disc ER parameters  Will update with culture result  inst to call if worse in the meantime  Meds ordered this encounter  Medications  . ciprofloxacin (CIPRO) 250 MG tablet    Sig: Take 1 tablet (250 mg total) by mouth 2 (two) times daily.    Dispense:  10 tablet    Refill:  0

## 2020-09-21 NOTE — Patient Instructions (Signed)
I sent cipro to your pharmacy  Keep drinking water  Take care of yourself  If symptoms worsen or change let me know  We will call when your urine culture result returns to check in

## 2020-09-23 LAB — URINE CULTURE
MICRO NUMBER:: 11721348
SPECIMEN QUALITY:: ADEQUATE

## 2020-10-18 ENCOUNTER — Other Ambulatory Visit: Payer: Self-pay

## 2020-10-18 ENCOUNTER — Encounter: Payer: Self-pay | Admitting: Family Medicine

## 2020-10-18 ENCOUNTER — Ambulatory Visit (INDEPENDENT_AMBULATORY_CARE_PROVIDER_SITE_OTHER): Payer: Medicare HMO | Admitting: Family Medicine

## 2020-10-18 VITALS — BP 158/80 | HR 71 | Temp 97.8°F | Ht 64.0 in | Wt 106.1 lb

## 2020-10-18 DIAGNOSIS — I8312 Varicose veins of left lower extremity with inflammation: Secondary | ICD-10-CM | POA: Diagnosis not present

## 2020-10-18 DIAGNOSIS — N3001 Acute cystitis with hematuria: Secondary | ICD-10-CM

## 2020-10-18 DIAGNOSIS — I8390 Asymptomatic varicose veins of unspecified lower extremity: Secondary | ICD-10-CM | POA: Insufficient documentation

## 2020-10-18 LAB — POC URINALSYSI DIPSTICK (AUTOMATED)
Bilirubin, UA: NEGATIVE
Glucose, UA: NEGATIVE
Ketones, UA: NEGATIVE
Nitrite, UA: NEGATIVE
Protein, UA: NEGATIVE
Spec Grav, UA: 1.02 (ref 1.010–1.025)
Urobilinogen, UA: 0.2 E.U./dL
pH, UA: 6.5 (ref 5.0–8.0)

## 2020-10-18 MED ORDER — CIPROFLOXACIN HCL 250 MG PO TABS
250.0000 mg | ORAL_TABLET | Freq: Two times a day (BID) | ORAL | 0 refills | Status: DC
Start: 2020-10-18 — End: 2020-10-25

## 2020-10-18 NOTE — Progress Notes (Signed)
Subjective:    Patient ID: Paula Jimenez, female    DOB: [birth year], 87 y.o.   MRN: [id number]  This visit occurred during the SARS-CoV-2 public health emergency.  Safety protocols were in place, including screening questions prior to the visit, additional usage of staff PPE, and extensive cleaning of exam room while observing appropriate contact time as indicated for disinfecting solutions.    HPI Pt presents for cyst/knot on left ankle Also f/u for hematuria   Wt Readings from Last 3 Encounters:  10/18/20 106 lb 1 oz (48.1 kg)  09/21/20 108 lb (49 kg)  04/19/20 107 lb 5 oz (48.7 kg)   18.21 kg/m  L leg felt swollen to her this week  Feels a knot (? Vein) Not painful Has been standing a lot   She was seen for hematuria /presumed uti on 09/21/20 Treated with cipro with pos UA Urine culture grew e coli that was pan sensitive  Thought she recovered from uti (but not 100%)  Urine looks fairly normal  A little pelvic pressure yesterday briefly - fine today   No appetite and tired   ua again today shows some leukocytes and micro blood     Results for orders placed or performed in visit on 10/18/20  POCT Urinalysis Dipstick (Automated)  Result Value Ref Range   Color, UA yellow    Clarity, UA clear    Glucose, UA Negative Negative   Bilirubin, UA negative    Ketones, UA negative    Spec Grav, UA 1.020 1.010 - 1.025   Blood, UA +/-    pH, UA 6.5 5.0 - 8.0   Protein, UA Negative Negative   Urobilinogen, UA 0.2 0.2 or 1.0 E.U./dL   Nitrite, UA negative    Leukocytes, UA Moderate (2+) (A) Negative    Intolerant of most antibiotics    Dr Rexford Maus is her urologist and she has an appt in august  Pt has h/o gross hematuria and renal angiomyolipoma and vaginal atrophy as well as urethral caruncle and hx of recurrent utis  Neg hematuria w/u in 2016   Patient Active Problem List   Diagnosis Date Noted  . Varicose vein of leg 10/18/2020  . Vaginal discharge 04/19/2020   . Pelvic pressure in female 04/19/2020  . Fatigue 05/30/2019  . Onychomycosis of toenail 03/02/2019  . Urethral caruncle 12/10/2018  . Acute cystitis 11/29/2018  . Elevated random blood glucose level 08/25/2018  . Medicare annual wellness visit, subsequent 08/25/2018  . Loose stools 06/28/2018  . Internal hemorrhoid, bleeding 04/20/2018  . Corn of toe 04/20/2018  . Ingrown nail of great toe of right foot 11/05/2017  . Routine general medical examination at a health care facility 08/07/2016  . Frequent UTI 02/16/2015  . Epistaxis, recurrent 02/28/2014  . History of shingles 02/20/2014  . Dizziness 01/06/2014  . Hearing loss 09/23/2007  . History of melanoma 02/25/2007  . Hyperlipidemia 02/25/2007  . MITRAL VALVE PROLAPSE 02/25/2007  . ALLERGIC RHINITIS 02/25/2007  . DEGENERATIVE DISC DISEASE, LUMBOSACRAL SPINE 02/25/2007  . Age related osteoporosis 02/25/2007  . HYPERTENSION, BENIGN 10/26/2006   Past Medical History:  Diagnosis Date  . Allergy   . Asthma    years ago, 2-3 times a year pt experiences a problem with breathing.  . Bursitis, hip   . Family history of adverse reaction to anesthesia    DAUGHTER had a reaction to ansethesia  . Hyperlipidemia   . Hypertension   . HYPERTENSION, BENIGN 10/26/2006  Qualifier: Diagnosis of  By: Selinda Orion    . MITRAL VALVE PROLAPSE 02/25/2007   Qualifier: History of  By: Marcelino Scot CMA, [place] Bilberry    . Osteopenia 08/01  . Osteoporosis   . Skin cancer 2015   nose   Past Surgical History:  Procedure Laterality Date  . ABDOMINAL HYSTERECTOMY  1971  . BREAST BIOPSY Right 06/99   fibrocystic  . CYSTOSCOPY WITH BIOPSY N/A 05/31/2015   Procedure: CYSTOSCOPY WITH BIOPSY/ fulgeration;  Surgeon: Hollice Espy, MD;  Location: [facility];  Service: Urology;  Laterality: N/A;  . DENTAL SURGERY    . EYE SURGERY Bilateral 2011   June and August of 2011  . ORIF RADIAL FRACTURE Left 11/99   arm fracture, radial no surgery  . TONSILLECTOMY      Social History   Tobacco Use  . Smoking status: Never Smoker  . Smokeless tobacco: Never Used  Vaping Use  . Vaping Use: Never used  Substance Use Topics  . Alcohol use: No    Alcohol/week: 0.0 standard drinks  . Drug use: No   Family History  Problem Relation Age of Onset  . Leukemia Sister   . Cancer Sister        leukemia  . Heart disease Mother   . Diabetes Mother   . Heart failure Mother   . Stroke Father   . Kidney disease Neg Hx   . Kidney cancer Neg Hx   . Bladder Cancer Neg Hx    Allergies  Allergen Reactions  . Clindamycin/Lincomycin Swelling    Throat swelling  . Codeine     Unknown per pt   . Keflex [Cephalexin]     dizzy  . Levofloxacin     Unknown per pt   . Rofecoxib     Unknown per pt   . Sulfa Antibiotics   . Valtrex [Valacyclovir Hcl]     Unknown per pt   . Adhesive [Tape] Rash  . Nitrofurantoin Nausea Only  . Penicillins Swelling    mouth swelling  . Raloxifene Rash  . Tramadol     dizzy   Current Outpatient Medications on File Prior to Visit  Medication Sig Dispense Refill  . Alum Hydroxide-Mag Carbonate 160-105 MG CHEW Chew 1 tablet by mouth daily as needed (for heartburn).     [name] atenolol (TENORMIN) 50 MG tablet Take 1 tablet (50 mg total) by mouth daily. 90 tablet 3  . AYR SALINE NASAL NA Place 1 spray into the nose daily as needed (allergies).     . clotrimazole-betamethasone (LOTRISONE) cream Apply 1 application topically as needed.    [name] estradiol (ESTRACE) 0.1 MG/GM vaginal cream Vaginal Estrogen Cream apply a blueberry sized amount to vaginal opening using finger tip 3 times weekly before bed. 42.5 g 6  . fluorometholone (FML) 0.1 % ophthalmic suspension Place 1 drop into both eyes 2 (two) times daily.    . fluticasone (FLONASE) 50 MCG/ACT nasal spray Place 2 sprays into both nostrils daily. 16 g 11  . hydrocortisone (ANUSOL-HC) 25 MG suppository Place 1 suppository (25 mg total) rectally 2 (two) times daily as needed for  hemorrhoids or anal itching. 12 suppository 1  . loperamide (IMODIUM) 2 MG capsule Take 2 mg by mouth as needed for diarrhea or loose stools (has been taking with Clindamycin).    [name] loratadine (CLARITIN) 10 MG tablet Take 10 mg by mouth as needed for allergies or rhinitis.    . Multiple Vitamin (MULTIVITAMIN) capsule Take 1  capsule by mouth daily.    . simvastatin (ZOCOR) 20 MG tablet Take 1 tablet (20 mg total) by mouth daily. 90 tablet 3   No current facility-administered medications on file prior to visit.    Review of Systems  Constitutional: Positive for fatigue. Negative for activity change, appetite change, fever and unexpected weight change.       General malaise Felt cold for an episode   HENT: Negative for congestion, ear pain, rhinorrhea, sinus pressure and sore throat.   Eyes: Negative for pain, redness and visual disturbance.  Respiratory: Negative for cough, shortness of breath and wheezing.   Cardiovascular: Negative for chest pain and palpitations.       Left leg may be swollen  Gastrointestinal: Negative for abdominal pain, blood in stool, constipation and diarrhea.  Endocrine: Negative for polydipsia and polyuria.  Genitourinary: Positive for dysuria and frequency. Negative for hematuria and urgency.       Mild dysuria once   Musculoskeletal: Negative for arthralgias, back pain and myalgias.  Skin: Negative for pallor and rash.  Allergic/Immunologic: Negative for environmental allergies.  Neurological: Negative for dizziness, syncope and headaches.  Hematological: Negative for adenopathy. Does not bruise/bleed easily.  Psychiatric/Behavioral: Negative for decreased concentration and dysphoric mood. The patient is not nervous/anxious.        Objective:   Physical Exam Constitutional:      General: She is not in acute distress.    Appearance: Normal appearance. She is well-developed and normal weight. She is not ill-appearing.     Comments: Under weight   HENT:      Head: Normocephalic and atraumatic.  Eyes:     Conjunctiva/sclera: Conjunctivae normal.     Pupils: Pupils are equal, round, and reactive to light.  Cardiovascular:     Rate and Rhythm: Normal rate and regular rhythm.     Heart sounds: Normal heart sounds.  Pulmonary:     Effort: Pulmonary effort is normal.     Breath sounds: Normal breath sounds.  Abdominal:     General: Bowel sounds are normal. There is no distension.     Palpations: Abdomen is soft.     Tenderness: There is abdominal tenderness. There is no rebound.     Comments: No cva tenderness  Mild suprapubic tenderness  Musculoskeletal:     Cervical back: Normal range of motion and neck supple.     Right lower leg: No edema.     Left lower leg: No edema.     Comments: No leg swelling  Prominent compressible varicosity on left lower leg (medial calf)  No erythema or heat or tenderness No swelling in legs or palpable cords  Neg Homan's sign  Lymphadenopathy:     Cervical: No cervical adenopathy.  Skin:    Coloration: Skin is not pale.     Findings: No erythema or rash.  Neurological:     Mental Status: She is alert.  Psychiatric:        Mood and Affect: Mood normal.           Assessment & Plan:   Problem List Items Addressed This Visit      Cardiovascular and Mediastinum   Varicose vein of leg    Varicosity of left lower leg is compressible and nontender  No signs of phlebitis  Adv use of support stockings when active or standing Watch for redness or pain or swelling  No signs of DVT on exam        Genitourinary  Acute cystitis - Primary    ? If not entirely resolved from prior of new  tx with cipro (reviewed risks) in light of many other drug allergies Sent for cx  Enc water intake  inst to call or go to ER if suddenly worse  Update if not starting to improve in a week or if worsening  Meds ordered this encounter  Medications  . ciprofloxacin (CIPRO) 250 MG tablet    Sig: Take 1 tablet (250  mg total) by mouth 2 (two) times daily.    Dispense:  10 tablet    Refill:  0         Relevant Orders   POCT Urinalysis Dipstick (Automated) (Completed)   Urine Culture

## 2020-10-18 NOTE — Assessment & Plan Note (Signed)
?   If not entirely resolved from prior of new  tx with cipro (reviewed risks) in light of many other drug allergies Sent for cx  Enc water intake  inst to call or go to ER if suddenly worse  Update if not starting to improve in a week or if worsening  Meds ordered this encounter  Medications  . ciprofloxacin (CIPRO) 250 MG tablet    Sig: Take 1 tablet (250 mg total) by mouth 2 (two) times daily.    Dispense:  10 tablet    Refill:  0

## 2020-10-18 NOTE — Assessment & Plan Note (Signed)
Varicosity of left lower leg is compressible and nontender  No signs of phlebitis  Adv use of support stockings when active or standing Watch for redness or pain or swelling  No signs of DVT on exam

## 2020-10-18 NOTE — Patient Instructions (Signed)
You may still have a uti  Urinalysis still shows some white blood cells  Keep drinking water  Take the cipro as directed while we wait for the result   We will contact you with the culture result when that returns   Make an effort to eat a little something regularly even if not hungry   You have an enlarged varicose vein on your leg  Wear support stockings when active and standing  If symptoms worsen or change please let me know

## 2020-10-19 LAB — URINE CULTURE
MICRO NUMBER:: 11826583
Result:: NO GROWTH
SPECIMEN QUALITY:: ADEQUATE

## 2020-10-22 ENCOUNTER — Telehealth: Payer: Self-pay | Admitting: Family Medicine

## 2020-10-22 DIAGNOSIS — R102 Pelvic and perineal pain: Secondary | ICD-10-CM

## 2020-10-22 DIAGNOSIS — N390 Urinary tract infection, site not specified: Secondary | ICD-10-CM

## 2020-10-22 NOTE — Telephone Encounter (Signed)
-----   Message from Tammi Sou, [place] sent at 10/22/2020 12:13 PM EDT ----- Pt notified of Dr. Marliss Coots comments. Pt agrees with referral to urology but she said the only place she knows how to drive to is [facility] so if there is a urologist on that campus she would like to go their

## 2020-10-24 NOTE — Progress Notes (Signed)
10/25/2020 9:10 PM   Paula Jimenez July 25, 1926 [id number]  Referring provider: Abner Greenspan, MD [address] [place],  [place] [address]  Chief Complaint  Patient presents with  . Recurrent UTI   Urological history: 1. High risk hematuria -non-smoker -hematuria work up in 2016 with TURBT-pathology cystica cystitis  -UA negative for micro heme   2. Right renal angiomyolipoma -RUS 02/2020 1.8 cm echogenic solid mass lower pole right kidney consistent with history of angiomyolipoma. Mass measures slightly smaller but suspect that this is related to measurement technique. The mass has not increased in size compared to multiple prior exams  3. Vaginal atrophy -managed with vaginal estrogen cream  4. Urethral caruncle -managed with vaginal estrogen cream  5. rUTI's -contributing factors of age and vaginal atrophy -documented positive urine cultures over the last year   Pansensitive E. coli on September 21, 2020  Pansensitive E. coli April 02, 2020    HPI: Paula Jimenez is a 87 y.o. female who presents today for pelvic pressure.  She has been experiencing suprapubic pain and pressure for the last several weeks.  She initially contacted her PCP on 09/21/2020 for these symptoms.   Urine culture was positive for E. Coli and was sensitive to the Cipro she was given.   Her symptoms did not abate with the Cipro.  A repeat culture was negative.    Her symptoms of suprapubic pain and pressure continue and intensify.    Her UA is positive for 6-10 WBC's.  Her PVR is 86 mL.    Patient denies any modifying or aggravating factors.  Patient denies any gross hematuria, dysuria or flank pain.  Patient denies any fevers, chills, nausea or vomiting.    PMH: Past Medical History:  Diagnosis Date  . Allergy   . Asthma    years ago, 2-3 times a year pt experiences a problem with breathing.  . Bursitis, hip   . Family history of adverse reaction to anesthesia    DAUGHTER had  a reaction to ansethesia  . Hyperlipidemia   . Hypertension   . HYPERTENSION, BENIGN 10/26/2006   Qualifier: Diagnosis of  By: Selinda Orion    . MITRAL VALVE PROLAPSE 02/25/2007   Qualifier: History of  By: Marcelino Scot CMA, [place]    . Osteopenia 08/01  . Osteoporosis   . Skin cancer 2015   nose    Surgical History: Past Surgical History:  Procedure Laterality Date  . ABDOMINAL HYSTERECTOMY  1971  . BREAST BIOPSY Right 06/99   fibrocystic  . CYSTOSCOPY WITH BIOPSY N/A 05/31/2015   Procedure: CYSTOSCOPY WITH BIOPSY/ fulgeration;  Surgeon: Hollice Espy, MD;  Location: [facility] ORS;  Service: Urology;  Laterality: N/A;  . DENTAL SURGERY    . EYE SURGERY Bilateral 2011   June and August of 2011  . ORIF RADIAL FRACTURE Left 11/99   arm fracture, radial no surgery  . TONSILLECTOMY      Home Medications:  Allergies as of 10/25/2020      Reactions   Clindamycin/lincomycin Swelling   Throat swelling   Codeine    Unknown per pt   Keflex [cephalexin]    dizzy   Levofloxacin    Unknown per pt   Rofecoxib    Unknown per pt   Sulfa Antibiotics    Valtrex [valacyclovir Hcl]    Unknown per pt   Adhesive [tape] Rash   Nitrofurantoin Nausea Only   Penicillins Swelling   mouth swelling  Raloxifene Rash   Tramadol    dizzy      Medication List       Accurate as of Oct 25, 2020 11:59 PM. If you have any questions, ask your nurse or doctor.        STOP taking these medications   ciprofloxacin 250 MG tablet Commonly known as: Cipro Stopped by: Zara Council, PA-C     TAKE these medications   Alum Hydroxide-Mag Carbonate 160-105 MG Chew Chew 1 tablet by mouth daily as needed (for heartburn).   atenolol 50 MG tablet Commonly known as: TENORMIN Take 1 tablet (50 mg total) by mouth daily.   AYR SALINE NASAL NA Place 1 spray into the nose daily as needed (allergies).   clotrimazole-betamethasone cream Commonly known as: LOTRISONE Apply 1 application topically as  needed.   estradiol 0.1 MG/GM vaginal cream Commonly known as: ESTRACE Vaginal Estrogen Cream apply a blueberry sized amount to vaginal opening using finger tip 3 times weekly before bed.   fluorometholone 0.1 % ophthalmic suspension Commonly known as: FML Place 1 drop into both eyes 2 (two) times daily.   fluticasone 50 MCG/ACT nasal spray Commonly known as: FLONASE Place 2 sprays into both nostrils daily.   hydrocortisone 25 MG suppository Commonly known as: ANUSOL-HC Place 1 suppository (25 mg total) rectally 2 (two) times daily as needed for hemorrhoids or anal itching.   loperamide 2 MG capsule Commonly known as: IMODIUM Take 2 mg by mouth as needed for diarrhea or loose stools (has been taking with Clindamycin).   loratadine 10 MG tablet Commonly known as: CLARITIN Take 10 mg by mouth as needed for allergies or rhinitis.   multivitamin capsule Take 1 capsule by mouth daily.   simvastatin 20 MG tablet Commonly known as: ZOCOR Take 1 tablet (20 mg total) by mouth daily.       Allergies:  Allergies  Allergen Reactions  . Clindamycin/Lincomycin Swelling    Throat swelling  . Codeine     Unknown per pt   . Keflex [Cephalexin]     dizzy  . Levofloxacin     Unknown per pt   . Rofecoxib     Unknown per pt   . Sulfa Antibiotics   . Valtrex [Valacyclovir Hcl]     Unknown per pt   . Adhesive [Tape] Rash  . Nitrofurantoin Nausea Only  . Penicillins Swelling    mouth swelling  . Raloxifene Rash  . Tramadol     dizzy    Family History: Family History  Problem Relation Age of Onset  . Leukemia Sister   . Cancer Sister        leukemia  . Heart disease Mother   . Diabetes Mother   . Heart failure Mother   . Stroke Father   . Kidney disease Neg Hx   . Kidney cancer Neg Hx   . Bladder Cancer Neg Hx     Social History:  reports that she has never smoked. She has never used smokeless tobacco. She reports that she does not drink alcohol and does not use  drugs.  ROS: For pertinent review of systems please refer to history of present illness  Physical Exam: BP (!) 155/89   Pulse 74   Ht 5\' 4"  (1.626 m)   Wt 106 lb (48.1 kg)   BMI 18.19 kg/m   Constitutional:  Well nourished. Alert and oriented, No acute distress. HEENT:  AT, mask in place.  Trachea midline Cardiovascular: No clubbing, cyanosis,  or edema. Respiratory: Normal respiratory effort, no increased work of breathing. Neurologic: Grossly intact, no focal deficits, moving all 4 extremities. Psychiatric: Normal mood and affect.   Laboratory Data: Component     Latest Ref Rng & Units 09/21/2020  Color, UA     Yellow yellow  Clarity, UA      dark  Glucose     Negative Negative  Bilirubin, UA     Negative 1  Ketones, UA     Negative neg  Specific Gravity, UA     1.005 - 1.030 1.015  RBC, UA     Negative pos  pH, UA     5.0 - 7.5 6.5  Protein,UA     Negative/Trace Positive (A)  Urobilinogen, UA     0.2 or 1.0 E.U./dL 1.0  Nitrite, UA     Negative neg  Leukocytes,UA     Negative Large (3+) (A)   Component     Latest Ref Rng & Units 10/18/2020  Color, UA     Yellow yellow  Clarity, UA      clear  Glucose     Negative Negative  Bilirubin, UA     Negative negative  Ketones, UA     Negative negative  Specific Gravity, UA     1.005 - 1.030 1.020  RBC, UA     Negative +/-  pH, UA     5.0 - 7.5 6.5  Protein,UA     Negative/Trace Negative  Urobilinogen, UA     0.2 or 1.0 E.U./dL 0.2  Nitrite, UA     Negative negative  Leukocytes,UA     Negative Moderate (2+) (A)   MICRO NUMBER: [id number]   SPECIMEN QUALITY: Adequate   Sample Source NOT GIVEN   STATUS: FINAL   ISOLATE 1: Escherichia coliAbnormal   Comment: Greater than 100,000 CFU/mL of Escherichia coli  COMMENT: Additional non-predominating organism(s) isolated. These organisms, commonly found on external and internal genitalia, are considered colonizers. No further testing performed.  Resulting  [facility]      Susceptibility   Escherichia coli    URINE CULTURE, REFLEX    AMOX/CLAVULANIC <=2  Sensitive    AMPICILLIN <=2  Sensitive    AMPICILLIN/SULBACTAM <=2  Sensitive    CEFAZOLIN <=4  Not Reportable 1    CEFEPIME <=1  Sensitive    CEFTRIAXONE <=1  Sensitive    CIPROFLOXACIN <=0.25  Sensitive    ERTAPENEM <=0.5  Sensitive    GENTAMICIN <=1  Sensitive    IMIPENEM <=0.25  Sensitive    LEVOFLOXACIN <=0.12  Sensitive    NITROFURANTOIN <=16  Sensitive    PIP/TAZO <=4  Sensitive    TOBRAMYCIN <=1  Sensitive    TRIMETH/SULFA <=20  Sensitive 2          1 For infections other than uncomplicated UTI  caused by E. coli, K. pneumoniae or P. mirabilis:  Cefazolin is resistant if MIC > or = 8 mcg/mL.  (Distinguishing susceptible versus intermediate  for isolates with MIC < or = 4 mcg/mL requires  additional testing.)  For uncomplicated UTI caused by E. coli,  K. pneumoniae or P. mirabilis: Cefazolin is  susceptible if MIC <32 mcg/mL and predicts  susceptible to the oral agents cefaclor, cefdinir,  cefpodoxime, cefprozil, cefuroxime, cephalexin  and loracarbef.  2 Legend:  S = Susceptible I = Intermediate  R = Resistant NS = Not susceptible  * = Not tested NR = Not reported  **NN = See antimicrobic  comments        Specimen Collected: 09/21/20 12:11 Last Resulted: 09/23/20 14:22       Component     Latest Ref Rng & Units 10/25/2020  Specific Gravity, UA     1.005 - 1.030 1.015  pH, UA     5.0 - 7.5 5.5  Color, UA     Yellow Yellow  Appearance Ur     Clear Clear  Leukocytes,UA     Negative Trace (A)  Protein,UA     Negative/Trace Negative  Glucose, UA     Negative Negative  Ketones, UA     Negative Negative  RBC, UA     Negative Trace (A)  Bilirubin, UA     Negative Negative  Urobilinogen, Ur     0.2 - 1.0 mg/dL 0.2  Nitrite, UA     Negative Negative  Microscopic Examination      See below:   Component     Latest Ref Rng & Units 10/25/2020   WBC, UA     0 - 5 /hpf 6-10 (A)  RBC     0 - 2 /hpf 0-2  Epithelial Cells (non renal)     0 - 10 /hpf 0-10  Bacteria, UA     None seen/Few None seen  I have reviewed the labs  Pertinent Imaging: No new imaging since last visit  Assessment & Plan:    1. Right renal angiomyolipoma -unchanged with multiple exams  2. Vaginal atrophy -continue vaginal estrogen cream  3. Urethral caruncle -See above  4. History of hematuria Completed hematuria work up in 05/2015, no findings of malignancies.  No recent hematuria.   Patient will contact the office if she experiencing gross hematuria  5. Suprapubic pressure -patient with lingering suprapubic pressure after treatment with appropriate antibiotic for several weeks.  It is likely an inflammatory response, but we will schedule a cysto to rule out any more concerning causes. -I have explained to the patient that they will  be scheduled for a cystoscopy in our office to evaluate their bladder.  The cystoscopy consists of passing a tube with a lens up through their urethra and into their urinary bladder.   We will inject the urethra with a lidocaine gel prior to introducing the cystoscope to help with any discomfort during the procedure.   After the procedure, they might experience blood in the urine and discomfort with urination.  This will abate after the first few voids.  I have  encouraged the patient to increase water intake  during this time.  Patient denies any allergies to lidocaine.   Return for cysto for persistant suprapubic pressure after UTI .  These notes generated with voice recognition software. I apologize for typographical errors.  Zara Council, PA-C  [facility] [address] [place], [place] [address] [phone]

## 2020-10-25 ENCOUNTER — Encounter: Payer: Self-pay | Admitting: Urology

## 2020-10-25 ENCOUNTER — Ambulatory Visit: Payer: Medicare HMO | Admitting: Urology

## 2020-10-25 ENCOUNTER — Other Ambulatory Visit: Payer: Self-pay

## 2020-10-25 VITALS — BP 155/89 | HR 74 | Ht 64.0 in | Wt 106.0 lb

## 2020-10-25 DIAGNOSIS — R102 Pelvic and perineal pain: Secondary | ICD-10-CM

## 2020-10-25 DIAGNOSIS — N362 Urethral caruncle: Secondary | ICD-10-CM

## 2020-10-25 DIAGNOSIS — N952 Postmenopausal atrophic vaginitis: Secondary | ICD-10-CM

## 2020-10-25 DIAGNOSIS — D179 Benign lipomatous neoplasm, unspecified: Secondary | ICD-10-CM | POA: Diagnosis not present

## 2020-10-25 LAB — BLADDER SCAN AMB NON-IMAGING: Scan Result: 86

## 2020-10-26 LAB — URINALYSIS, COMPLETE
Bilirubin, UA: NEGATIVE
Glucose, UA: NEGATIVE
Ketones, UA: NEGATIVE
Nitrite, UA: NEGATIVE
Protein,UA: NEGATIVE
Specific Gravity, UA: 1.015 (ref 1.005–1.030)
Urobilinogen, Ur: 0.2 mg/dL (ref 0.2–1.0)
pH, UA: 5.5 (ref 5.0–7.5)

## 2020-10-26 LAB — MICROSCOPIC EXAMINATION: Bacteria, UA: NONE SEEN

## 2020-10-31 LAB — CULTURE, URINE COMPREHENSIVE

## 2020-11-07 ENCOUNTER — Ambulatory Visit: Payer: Medicare HMO | Admitting: Urology

## 2020-11-07 ENCOUNTER — Encounter: Payer: Self-pay | Admitting: Urology

## 2020-11-07 ENCOUNTER — Other Ambulatory Visit: Payer: Self-pay

## 2020-11-07 VITALS — BP 159/81 | HR 77 | Ht 64.0 in | Wt 106.0 lb

## 2020-11-07 DIAGNOSIS — Z87448 Personal history of other diseases of urinary system: Secondary | ICD-10-CM

## 2020-11-07 DIAGNOSIS — R102 Pelvic and perineal pain: Secondary | ICD-10-CM

## 2020-11-07 DIAGNOSIS — D179 Benign lipomatous neoplasm, unspecified: Secondary | ICD-10-CM

## 2020-11-08 NOTE — Progress Notes (Signed)
   11/07/20  CC:  Chief Complaint  Patient presents with  . Cysto    HPI: 87 year old female with a personal history of urethral carbuncle, recurrent UTIs, suprapubic pressure as well as remote history of high risk hematuria who presents today for cystoscopy.  PCP she has notes for details.  Blood pressure (!) 159/81, pulse 77, height 5\' 4"  (1.626 m), weight 106 lb (48.1 kg). NED. A&Ox3.   No respiratory distress   Abd soft, NT, ND Normal external genitalia with patent urethral meatus  Cystoscopy Procedure Note  Patient identification was confirmed, informed consent was obtained, and patient was prepped using Betadine solution.  Lidocaine jelly was administered per urethral meatus.    Procedure: - Flexible cystoscope introduced, without any difficulty.   - Thorough search of the bladder revealed:    normal urethral meatus with caruncle present as previously noted    normal urothelium    no stones    no ulcers     no tumors    no urethral polyps    Mild trabeculation  - Ureteral orifices were normal in position and appearance.  Post-Procedure: - Patient tolerated the procedure well  Assessment/ Plan:  1. Pelvic pressure in female Bladder unremarkable today, reassuring   2. History of hematuria As above - Urinalysis, Complete  3. Angiomyolipoma Follow-up renal ultrasound in 6 months as previously scheduled   Hollice Espy, MD

## 2020-11-09 LAB — MICROSCOPIC EXAMINATION: Bacteria, UA: NONE SEEN

## 2020-11-09 LAB — URINALYSIS, COMPLETE
Bilirubin, UA: NEGATIVE
Glucose, UA: NEGATIVE
Ketones, UA: NEGATIVE
Leukocytes,UA: NEGATIVE
Nitrite, UA: NEGATIVE
Protein,UA: NEGATIVE
Specific Gravity, UA: 1.01 (ref 1.005–1.030)
Urobilinogen, Ur: 0.2 mg/dL (ref 0.2–1.0)
pH, UA: 5.5 (ref 5.0–7.5)

## 2020-11-26 ENCOUNTER — Other Ambulatory Visit: Payer: Self-pay

## 2020-11-26 ENCOUNTER — Ambulatory Visit (INDEPENDENT_AMBULATORY_CARE_PROVIDER_SITE_OTHER): Payer: Medicare HMO

## 2020-11-26 ENCOUNTER — Other Ambulatory Visit: Payer: Self-pay | Admitting: Family Medicine

## 2020-11-26 DIAGNOSIS — Z0000 Encounter for general adult medical examination without abnormal findings: Secondary | ICD-10-CM | POA: Diagnosis not present

## 2020-11-26 NOTE — Progress Notes (Signed)
PCP notes:  Health Maintenance: Covid- declined shingrix- declined   Abnormal Screenings: none   Patient concerns: none   Nurse concerns: none   Next PCP appt.: none

## 2020-11-26 NOTE — Progress Notes (Signed)
Subjective:   Paula Jimenez is a 87 y.o. female who presents for Medicare Annual (Subsequent) preventive examination.  Review of Systems: N/A      I connected with the patient today by telephone and verified that I am speaking with the correct person using two identifiers. Location patient: home Location nurse: work Persons participating in the telephone visit: patient, nurse.   I discussed the limitations, risks, security and privacy concerns of performing an evaluation and management service by telephone and the availability of in person appointments. I also discussed with the patient that there may be a patient responsible charge related to this service. The patient expressed understanding and verbally consented to this telephonic visit.        Cardiac Risk Factors include: advanced age (>82men, >8 women);hypertension;Other (see comment), Risk factor comments: hyperlipidemia     Objective:    Today's Vitals   There is no height or weight on file to calculate BMI.  Advanced Directives 11/26/2020 08/12/2017 08/08/2016 08/07/2015 05/14/2015 04/25/2015  Does Patient Have a Medical Advance Directive? Yes Yes Yes Yes Yes No  Type of Paramedic of [place];Living will [place];Living will [place];Living will [place];Living will [place];Living will -  Does patient want to make changes to medical advance directive? - - - No - Patient declined No - Patient declined -  Copy of [place] in Chart? No - copy requested No - copy requested No - copy requested No - copy requested No - copy requested -  Would patient like information on creating a medical advance directive? - - - - No - patient declined information Yes - Educational materials given    Current Medications (verified) Outpatient Encounter Medications as of 11/26/2020  Medication Sig  . Alum Hydroxide-Mag  Carbonate 160-105 MG CHEW Chew 1 tablet by mouth daily as needed (for heartburn).   [name] atenolol (TENORMIN) 50 MG tablet Take 1 tablet (50 mg total) by mouth daily.  Skipper Cliche SALINE NASAL NA Place 1 spray into the nose daily as needed (allergies).   . clotrimazole-betamethasone (LOTRISONE) cream Apply 1 application topically as needed.  [name] estradiol (ESTRACE) 0.1 MG/GM vaginal cream Vaginal Estrogen Cream apply a blueberry sized amount to vaginal opening using finger tip 3 times weekly before bed.  . fluorometholone (FML) 0.1 % ophthalmic suspension Place 1 drop into both eyes 2 (two) times daily.  . fluticasone (FLONASE) 50 MCG/ACT nasal spray Place 2 sprays into both nostrils daily.  . hydrocortisone (ANUSOL-HC) 25 MG suppository Place 1 suppository (25 mg total) rectally 2 (two) times daily as needed for hemorrhoids or anal itching.  . loperamide (IMODIUM) 2 MG capsule Take 2 mg by mouth as needed for diarrhea or loose stools (has been taking with Clindamycin).  [name] loratadine (CLARITIN) 10 MG tablet Take 10 mg by mouth as needed for allergies or rhinitis.  . Multiple Vitamin (MULTIVITAMIN) capsule Take 1 capsule by mouth daily.  . simvastatin (ZOCOR) 20 MG tablet Take 1 tablet (20 mg total) by mouth daily.   No facility-administered encounter medications on file as of 11/26/2020.    Allergies (verified) Clindamycin/lincomycin, Codeine, Keflex [cephalexin], Levofloxacin, Rofecoxib, Sulfa antibiotics, Valtrex [valacyclovir hcl], Adhesive [tape], Nitrofurantoin, Penicillins, Raloxifene, and Tramadol   History: Past Medical History:  Diagnosis Date  . Allergy   . Asthma    years ago, 2-3 times a year pt experiences a problem with breathing.  . Bursitis,  hip   . Family history of adverse reaction to anesthesia    DAUGHTER had a reaction to ansethesia  . Hyperlipidemia   . Hypertension   . HYPERTENSION, BENIGN 10/26/2006   Qualifier: Diagnosis of  By: Selinda Orion    . MITRAL VALVE PROLAPSE  02/25/2007   Qualifier: History of  By: Marcelino Scot CMA, [place]    . Osteopenia 08/01  . Osteoporosis   . Skin cancer 2015   nose   Past Surgical History:  Procedure Laterality Date  . ABDOMINAL HYSTERECTOMY  1971  . BREAST BIOPSY Right 06/99   fibrocystic  . CYSTOSCOPY WITH BIOPSY N/A 05/31/2015   Procedure: CYSTOSCOPY WITH BIOPSY/ fulgeration;  Surgeon: Hollice Espy, MD;  Location: [facility] ORS;  Service: Urology;  Laterality: N/A;  . DENTAL SURGERY    . EYE SURGERY Bilateral 2011   June and August of 2011  . ORIF RADIAL FRACTURE Left 11/99   arm fracture, radial no surgery  . TONSILLECTOMY     Family History  Problem Relation Age of Onset  . Leukemia Sister   . Cancer Sister        leukemia  . Heart disease Mother   . Diabetes Mother   . Heart failure Mother   . Stroke Father   . Kidney disease Neg Hx   . Kidney cancer Neg Hx   . Bladder Cancer Neg Hx    Social History   Socioeconomic History  . Marital status: Widowed    Spouse name: Not on file  . Number of children: 4  . Years of education: Not on file  . Highest education level: Not on file  Occupational History  . Occupation: retired  Tobacco Use  . Smoking status: Never Smoker  . Smokeless tobacco: Never Used  Vaping Use  . Vaping Use: Never used  Substance and Sexual Activity  . Alcohol use: No    Alcohol/week: 0.0 standard drinks  . Drug use: No  . Sexual activity: Never  Other Topics Concern  . Not on file  Social History Narrative   caring for daughter with leg injury   caring for sister with leukemia          Social Determinants of Health   Financial Resource Strain: Low Risk   . Difficulty of Paying Living Expenses: Not hard at all  Food Insecurity: No Food Insecurity  . Worried About Charity fundraiser in the Last Year: Never true  . Ran Out of Food in the Last Year: Never true  Transportation Needs: No Transportation Needs  . Lack of Transportation (Medical): No  . Lack of  Transportation (Non-Medical): No  Physical Activity: Inactive  . Days of Exercise per Week: 0 days  . Minutes of Exercise per Session: 0 min  Stress: No Stress Concern Present  . Feeling of Stress : Not at all  Social Connections: Not on file    Tobacco Counseling Counseling given: Not Answered   Clinical Intake:  Pre-visit preparation completed: Yes  Pain : No/denies pain     Nutritional Risks: None Diabetes: No  How often do you need to have someone help you when you read instructions, pamphlets, or other written materials from your doctor or pharmacy?: 1 - Never What is the last grade level you completed in school?: 10th  Diabetic: No Nutrition Risk Assessment:  Has the patient had any N/V/D within the last 2 months?  No  Does the patient have any non-healing wounds?  No  Has the patient had any unintentional weight loss or weight gain?  No   Diabetes:  Is the patient diabetic?  No  If diabetic, was a CBG obtained today?  N/A Did the patient bring in their glucometer from home?  N/A How often do you monitor your CBG's? N/A.   Financial Strains and Diabetes Management:  Are you having any financial strains with the device, your supplies or your medication? N/A.  Does the patient want to be seen by Chronic Care Management for management of their diabetes?  N/A Would the patient like to be referred to a Nutritionist or for Diabetic Management?  N/A  Interpreter Needed?: No  Information entered by :: CJohnson, LPN   Activities of Daily Living In your present state of health, do you have any difficulty performing the following activities: 11/26/2020  Hearing? N  Vision? N  Difficulty concentrating or making decisions? N  Walking or climbing stairs? N  Dressing or bathing? N  Doing errands, shopping? N  Preparing Food and eating ? N  Using the Toilet? N  In the past six months, have you accidently leaked urine? N  Do you have problems with loss of bowel  control? N  Managing your Medications? N  Managing your Finances? N  Housekeeping or managing your Housekeeping? N  Some recent data might be hidden    Patient Care Team: Tower, Wynelle Fanny, MD as PCP - Valinda Party, MD as Referring Physician (Ophthalmology)  Indicate any recent Medical Services you may have received from other than [facility] providers in the past year (date may be approximate).     Assessment:   This is a routine wellness examination for Danely.  Hearing/Vision screen  Hearing Screening   125Hz  250Hz  500Hz  1000Hz  2000Hz  3000Hz  4000Hz  6000Hz  8000Hz   Right ear:           Left ear:           Vision Screening Comments: Patient gets annual eye exams   Dietary issues and exercise activities discussed: Current Exercise Habits: The patient does not participate in regular exercise at present, Exercise limited by: None identified  Goals Addressed            This Visit's Progress   . Patient Stated       11/26/2020, I will maintain and continue medications as prescribed.      Depression Screen PHQ 2/9 Scores 11/26/2020 10/21/2019 08/25/2018 08/12/2017 08/08/2016 08/07/2015 11/09/2012  PHQ - 2 Score 0 0 0 0 0 0 0  PHQ- 9 Score 0 - - 3 - - -    Fall Risk Fall Risk  11/26/2020 10/21/2019 08/25/2018 08/12/2017 08/08/2016  Falls in the past year? 1 1 0 No No  Number falls in past yr: 0 1 - - -  Injury with Fall? 0 0 - - -  Risk for fall due to : Medication side effect - - - -  Follow up Falls evaluation completed;Falls prevention discussed - - - -    FALL RISK PREVENTION PERTAINING TO THE HOME:  Any stairs in or around the home? Yes  If so, are there any without handrails? No  Home free of loose throw rugs in walkways, pet beds, electrical cords, etc? Yes  Adequate lighting in your home to reduce risk of falls? Yes   ASSISTIVE DEVICES UTILIZED TO PREVENT FALLS:  Life alert? No  Use of a cane, walker or w/c? No  Grab bars in the bathroom? No  Shower chair  or bench in  shower? No  Elevated toilet seat or a handicapped toilet? No   TIMED UP AND GO:  Was the test performed? N/A telephone visit .   Cognitive Function: MMSE - Mini Mental State Exam 11/26/2020 08/12/2017 08/08/2016 08/07/2015  Not completed: Unable to complete - - -  Orientation to time - 5 5 5   Orientation to Place - 5 5 5   Registration - 3 3 3   Attention/ Calculation - 0 0 5  Recall - 3 3 3   Language- name 2 objects - 0 0 -  Language- repeat - 1 1 1   Language- follow 3 step command - 3 3 3   Language- read & follow direction - 0 0 1  Write a sentence - 0 0 -  Copy design - 0 0 1  Total score - 20 20 -  Mini Cog  Mini-Cog screen was not completed. Cell phone connection was bad. Maximum score is 22. A value of 0 denotes this part of the MMSE was not completed or the patient failed this part of the Mini-Cog screening.       Immunizations Immunization History  Administered Date(s) Administered  . Fluad Quad(high Dose 65+) 03/02/2019  . Influenza, High Dose Seasonal PF 05/12/2017, 04/20/2018  . Influenza,inj,Quad PF,6+ Mos 06/22/2013, 06/26/2014, 04/25/2015, 04/21/2016  . Pneumococcal Conjugate-13 06/26/2014  . Pneumococcal Polysaccharide-23 11/19/2011  . Td 04/24/1998, 11/10/2008  . Zoster, Live 08/20/2016    TDAP status: Due, Education has been provided regarding the importance of this vaccine. Advised may receive this vaccine at local pharmacy or Health Dept. Aware to provide a copy of the vaccination record if obtained from local pharmacy or Health Dept. Verbalized acceptance and understanding.  Flu Vaccine status: due Fall 2022  Pneumococcal vaccine status: Up to date  Covid-19 vaccine status: Declined, Education has been provided regarding the importance of this vaccine but patient still declined. Advised may receive this vaccine at local pharmacy or Health Dept.or vaccine clinic. Aware to provide a copy of the vaccination record if obtained from local pharmacy or Health  Dept. Verbalized acceptance and understanding.  Qualifies for Shingles Vaccine? Yes   Zostavax completed Yes   Shingrix Completed?: No.    Education has been provided regarding the importance of this vaccine. Patient has been advised to call insurance company to determine out of pocket expense if they have not yet received this vaccine. Advised may also receive vaccine at local pharmacy or Health Dept. Verbalized acceptance and understanding.  Screening Tests Health Maintenance  Topic Date Due  . COVID-19 Vaccine (1) Never done  . Zoster Vaccines- Shingrix (1 of 2) Never done  . TETANUS/TDAP  11/11/2018  . INFLUENZA VACCINE  01/21/2021  . DEXA SCAN  Completed  . PNA vac Low Risk Adult  Completed  . Pneumococcal Vaccine 78-87 Years old  Aged Out  . HPV VACCINES  Aged Out    Health Maintenance  Health Maintenance Due  Topic Date Due  . COVID-19 Vaccine (1) Never done  . Zoster Vaccines- Shingrix (1 of 2) Never done  . TETANUS/TDAP  11/11/2018    Colorectal cancer screening: No longer required.   Mammogram status: No longer required due to age.  Bone Density status: decline  Lung Cancer Screening: (Low Dose CT Chest recommended if Age 22-80 years, 30 pack-year currently smoking OR have quit w/in 15years.) does not qualify.   Additional Screening:  Hepatitis C Screening: does not qualify; Completed N/A  Vision Screening: Recommended annual ophthalmology exams  for early detection of glaucoma and other disorders of the eye. Is the patient up to date with their annual eye exam?  Yes  Who is the provider or what is the name of the office in which the patient attends annual eye exams? Eye doctor in [place]  If pt is not established with a provider, would they like to be referred to a provider to establish care? No .   Dental Screening: Recommended annual dental exams for proper oral hygiene  Community Resource Referral / Chronic Care Management: CRR required this visit?  No    CCM required this visit?  No      Plan:     I have personally reviewed and noted the following in the patient's chart:   . Medical and social history . Use of alcohol, tobacco or illicit drugs  . Current medications and supplements including opioid prescriptions.  . Functional ability and status . Nutritional status . Physical activity . Advanced directives . List of other physicians . Hospitalizations, surgeries, and ER visits in previous 12 months . Vitals . Screenings to include cognitive, depression, and falls . Referrals and appointments  In addition, I have reviewed and discussed with patient certain preventive protocols, quality metrics, and best practice recommendations. A written personalized care plan for preventive services as well as general preventive health recommendations were provided to patient.   Due to this being a telephonic visit, the after visit summary with patients personalized plan was offered to patient via office or my-chart. Patient preferred to pick up at office at next visit or via mychart.   Andrez Grime, LPN   02/28/9210

## 2020-11-26 NOTE — Patient Instructions (Signed)
Paula Jimenez , Thank you for taking time to come for your Medicare Wellness Visit. I appreciate your ongoing commitment to your health goals. Please review the following plan we discussed and let me know if I can assist you in the future.   Screening recommendations/referrals: Colonoscopy: no longer required  Mammogram: no longer required  Bone Density: decline Recommended yearly ophthalmology/optometry visit for glaucoma screening and checkup Recommended yearly dental visit for hygiene and checkup  Vaccinations: Influenza vaccine: due fall 2022  Pneumococcal vaccine: Completed series Tdap vaccine: decline-insurance  Shingles vaccine: decline   Covid-19:decline  Advanced directives: Please bring a copy of your POA (Power of Attorney) and/or Living Will to your next appointment.   Conditions/risks identified: hypertension, hyperlipidemia   Next appointment: Follow up in one year for your annual wellness visit    Preventive Care 30 Years and Older, Female Preventive care refers to lifestyle choices and visits with your health care provider that can promote health and wellness. What does preventive care include?  A yearly physical exam. This is also called an annual well check.  Dental exams once or twice a year.  Routine eye exams. Ask your health care provider how often you should have your eyes checked.  Personal lifestyle choices, including:  Daily care of your teeth and gums.  Regular physical activity.  Eating a healthy diet.  Avoiding tobacco and drug use.  Limiting alcohol use.  Practicing safe sex.  Taking low-dose aspirin every day.  Taking vitamin and mineral supplements as recommended by your health care provider. What happens during an annual well check? The services and screenings done by your health care provider during your annual well check will depend on your age, overall health, lifestyle risk factors, and family history of disease. Counseling  Your  health care provider may ask you questions about your:  Alcohol use.  Tobacco use.  Drug use.  Emotional well-being.  Home and relationship well-being.  Sexual activity.  Eating habits.  History of falls.  Memory and ability to understand (cognition).  Work and work Statistician.  Reproductive health. Screening  You may have the following tests or measurements:  Height, weight, and BMI.  Blood pressure.  Lipid and cholesterol levels. These may be checked every 5 years, or more frequently if you are over 81 years old.  Skin check.  Lung cancer screening. You may have this screening every year starting at age 27 if you have a 30-pack-year history of smoking and currently smoke or have quit within the past 15 years.  Fecal occult blood test (FOBT) of the stool. You may have this test every year starting at age 43.  Flexible sigmoidoscopy or colonoscopy. You may have a sigmoidoscopy every 5 years or a colonoscopy every 10 years starting at age 26.  Hepatitis C blood test.  Hepatitis B blood test.  Sexually transmitted disease (STD) testing.  Diabetes screening. This is done by checking your blood sugar (glucose) after you have not eaten for a while (fasting). You may have this done every 1-3 years.  Bone density scan. This is done to screen for osteoporosis. You may have this done starting at age 20.  Mammogram. This may be done every 1-2 years. Talk to your health care provider about how often you should have regular mammograms. Talk with your health care provider about your test results, treatment options, and if necessary, the need for more tests. Vaccines  Your health care provider may recommend certain vaccines, such as:  Influenza  vaccine. This is recommended every year.  Tetanus, diphtheria, and acellular pertussis (Tdap, Td) vaccine. You may need a Td booster every 10 years.  Zoster vaccine. You may need this after age 53.  Pneumococcal 13-valent  conjugate (PCV13) vaccine. One dose is recommended after age 8.  Pneumococcal polysaccharide (PPSV23) vaccine. One dose is recommended after age 48. Talk to your health care provider about which screenings and vaccines you need and how often you need them. This information is not intended to replace advice given to you by your health care provider. Make sure you discuss any questions you have with your health care provider. Document Released: 07/06/2015 Document Revised: 02/27/2016 Document Reviewed: 04/10/2015 Elsevier Interactive Patient Education  2017 [place] Prevention in the Home Falls can cause injuries. They can happen to people of all ages. There are many things you can do to make your home safe and to help prevent falls. What can I do on the outside of my home?  Regularly fix the edges of walkways and driveways and fix any cracks.  Remove anything that might make you trip as you walk through a door, such as a raised step or threshold.  Trim any bushes or trees on the path to your home.  Use bright outdoor lighting.  Clear any walking paths of anything that might make someone trip, such as rocks or tools.  Regularly check to see if handrails are loose or broken. Make sure that both sides of any steps have handrails.  Any raised decks and porches should have guardrails on the edges.  Have any leaves, snow, or ice cleared regularly.  Use sand or salt on walking paths during winter.  Clean up any spills in your garage right away. This includes oil or grease spills. What can I do in the bathroom?  Use night lights.  Install grab bars by the toilet and in the tub and shower. Do not use towel bars as grab bars.  Use non-skid mats or decals in the tub or shower.  If you need to sit down in the shower, use a plastic, non-slip stool.  Keep the floor dry. Clean up any water that spills on the floor as soon as it happens.  Remove soap buildup in the tub or  shower regularly.  Attach bath mats securely with double-sided non-slip rug tape.  Do not have throw rugs and other things on the floor that can make you trip. What can I do in the bedroom?  Use night lights.  Make sure that you have a light by your bed that is easy to reach.  Do not use any sheets or blankets that are too big for your bed. They should not hang down onto the floor.  Have a firm chair that has side arms. You can use this for support while you get dressed.  Do not have throw rugs and other things on the floor that can make you trip. What can I do in the kitchen?  Clean up any spills right away.  Avoid walking on wet floors.  Keep items that you use a lot in easy-to-reach places.  If you need to reach something above you, use a strong step stool that has a grab bar.  Keep electrical cords out of the way.  Do not use floor polish or wax that makes floors slippery. If you must use wax, use non-skid floor wax.  Do not have throw rugs and other things on the floor that can  make you trip. What can I do with my stairs?  Do not leave any items on the stairs.  Make sure that there are handrails on both sides of the stairs and use them. Fix handrails that are broken or loose. Make sure that handrails are as long as the stairways.  Check any carpeting to make sure that it is firmly attached to the stairs. Fix any carpet that is loose or worn.  Avoid having throw rugs at the top or bottom of the stairs. If you do have throw rugs, attach them to the floor with carpet tape.  Make sure that you have a light switch at the top of the stairs and the bottom of the stairs. If you do not have them, ask someone to add them for you. What else can I do to help prevent falls?  Wear shoes that:  Do not have high heels.  Have rubber bottoms.  Are comfortable and fit you well.  Are closed at the toe. Do not wear sandals.  If you use a stepladder:  Make sure that it is fully  opened. Do not climb a closed stepladder.  Make sure that both sides of the stepladder are locked into place.  Ask someone to hold it for you, if possible.  Clearly mark and make sure that you can see:  Any grab bars or handrails.  First and last steps.  Where the edge of each step is.  Use tools that help you move around (mobility aids) if they are needed. These include:  Canes.  Walkers.  Scooters.  Crutches.  Turn on the lights when you go into a dark area. Replace any light bulbs as soon as they burn out.  Set up your furniture so you have a clear path. Avoid moving your furniture around.  If any of your floors are uneven, fix them.  If there are any pets around you, be aware of where they are.  Review your medicines with your doctor. Some medicines can make you feel dizzy. This can increase your chance of falling. Ask your doctor what other things that you can do to help prevent falls. This information is not intended to replace advice given to you by your health care provider. Make sure you discuss any questions you have with your health care provider. Document Released: 04/05/2009 Document Revised: 11/15/2015 Document Reviewed: 07/14/2014 Elsevier Interactive Patient Education  2017 [employer].

## 2020-11-27 NOTE — Telephone Encounter (Signed)
Did refills. Pt is due for Medicare Wellness

## 2020-12-05 NOTE — Telephone Encounter (Signed)
Patient is scheduled   

## 2021-01-07 DIAGNOSIS — C44319 Basal cell carcinoma of skin of other parts of face: Secondary | ICD-10-CM | POA: Diagnosis not present

## 2021-01-07 DIAGNOSIS — Z85828 Personal history of other malignant neoplasm of skin: Secondary | ICD-10-CM | POA: Diagnosis not present

## 2021-01-07 DIAGNOSIS — D485 Neoplasm of uncertain behavior of skin: Secondary | ICD-10-CM | POA: Diagnosis not present

## 2021-02-05 DIAGNOSIS — C44319 Basal cell carcinoma of skin of other parts of face: Secondary | ICD-10-CM | POA: Diagnosis not present

## 2021-02-05 DIAGNOSIS — Z85828 Personal history of other malignant neoplasm of skin: Secondary | ICD-10-CM | POA: Diagnosis not present

## 2021-02-08 ENCOUNTER — Ambulatory Visit: Payer: Self-pay | Admitting: Urology

## 2021-02-24 ENCOUNTER — Other Ambulatory Visit: Payer: Self-pay | Admitting: Family Medicine

## 2021-02-27 NOTE — Telephone Encounter (Signed)
Please schedule a f/u for when we return (or at [facility] earlier if she can get there) and refill med until then

## 2021-02-27 NOTE — Telephone Encounter (Signed)
Last labs was 10/10/19, given upcomming move, is it okay to fill meds or does pt need an appt., pt did have an appt with PCP on 10/18/20, no future appts., please advise

## 2021-02-28 NOTE — Telephone Encounter (Addendum)
Pt needs refill on flonase nasal spray also. Pt is on the last bottle. Pt is scheduled for Jan 3

## 2021-02-28 NOTE — Telephone Encounter (Signed)
Meds refilled and will route to the support pool to either schedule a f/u at [facility] or here in Jan 2023

## 2021-03-04 ENCOUNTER — Other Ambulatory Visit: Payer: Self-pay

## 2021-03-04 ENCOUNTER — Encounter: Payer: Self-pay | Admitting: Podiatry

## 2021-03-04 ENCOUNTER — Ambulatory Visit: Payer: Medicare HMO | Admitting: Podiatry

## 2021-03-04 DIAGNOSIS — B351 Tinea unguium: Secondary | ICD-10-CM

## 2021-03-04 DIAGNOSIS — M79609 Pain in unspecified limb: Secondary | ICD-10-CM | POA: Diagnosis not present

## 2021-03-04 NOTE — Progress Notes (Signed)
Complaint:  Visit Type: Patient returns to my office for continued preventative foot care services. Complaint: Patient states" my nails have grown long and thick and become painful to walk and wear shoes" Patient is wearing pad under her big toe joint right foot to reduce pain. The patient presents for preventative foot care services. No changes to ROS  Podiatric Exam: Vascular: dorsalis pedis and posterior tibial pulses are weakly  palpable bilateral. Capillary return is immediate. Cold feet.  Absent digital hair.. Skin turgor WNL  Sensorium: Normal Semmes Weinstein monofilament test. Normal tactile sensation bilaterally. Nail Exam: Pt has thick disfigured discolored nails with subungual debris noted bilateral entire nail hallux through fifth toenails Ulcer Exam: There is no evidence of ulcer or pre-ulcerative changes or infection. Orthopedic Exam: Muscle tone and strength are WNL. No limitations in general ROM. No crepitus or effusions noted. Foot type and digits show no abnormalities. Bony prominences are unremarkable.  Prominent sesamoid bone sub 1st MPJ  right foot. Skin: No Porokeratosis. No infection or ulcers  Diagnosis:  Onychomycosis, , Pain in right toe, pain in left toes  Treatment & Plan Procedures and Treatment: Consent by patient was obtained for treatment procedures.   Debridement of mycotic and hypertrophic toenails, 1 through 5 bilateral and clearing of subungual debris. No ulceration, no infection noted.  Return Visit-Office Procedure: Patient instructed to return to the office for a follow up visit 6 months for continued evaluation and treatment.    Gardiner Barefoot DPM

## 2021-03-06 ENCOUNTER — Other Ambulatory Visit: Payer: Self-pay | Admitting: *Deleted

## 2021-03-06 DIAGNOSIS — Z87448 Personal history of other diseases of urinary system: Secondary | ICD-10-CM

## 2021-03-06 NOTE — Progress Notes (Incomplete)
03/06/21 8:29 AM   Paula Jimenez [birth year] [id number]  Referring provider:  Abner Greenspan, MD [address] [place],  [place] [address] No chief complaint on file.  Urological history:  High risk hematuria  - non-smoker  - 2016 hematuria work-up with [facility] - pathology cystica cystitis  - UA on 11/07/2020 showed trace RBCs   2. Right renal angiomyoclipoma  - RUS 02/2020 1.8 echogenic solid mass lower pole right kidney consisten with history of angiomyolipoma.   3. Vaginal atrophy  - managed with vaginal estrogen cream  4. Urethral caruncle  - managed with vaginal estrogen cream   5. rUTI's  - contributing factors of age and vaginal atrophy - Last urine culture on 10/25/2020 was normal  - Positive urine cultures over the last year  Pansensitive E. Coli on September 21, 2020      HPI: Paula Jimenez is a 87 y.o.female who presents today for follow-up for RUS and exam.    She reports ***   Patient denies any modifying or aggravating factors.  Patient denies any gross hematuria, dysuria or suprapubic/flank pain.  Patient denies any fevers, chills, nausea or vomiting. ***      PMH: Past Medical History:  Diagnosis Date   Allergy    Asthma    years ago, 2-3 times a year pt experiences a problem with breathing.   Bursitis, hip    Family history of adverse reaction to anesthesia    DAUGHTER had a reaction to ansethesia   Hyperlipidemia    Hypertension    HYPERTENSION, BENIGN 10/26/2006   Qualifier: Diagnosis of  By: Selinda Orion     MITRAL VALVE PROLAPSE 02/25/2007   Qualifier: History of  By: Marcelino Scot CMA, [place] Bilberry     Osteopenia 08/01   Osteoporosis    Skin cancer 2015   nose    Surgical History: Past Surgical History:  Procedure Laterality Date   ABDOMINAL HYSTERECTOMY  1971   BREAST BIOPSY Right 06/99   fibrocystic   CYSTOSCOPY WITH BIOPSY N/A 05/31/2015   Procedure: CYSTOSCOPY WITH BIOPSY/ fulgeration;  Surgeon: Hollice Espy, MD;   Location: [facility] ORS;  Service: Urology;  Laterality: N/A;   DENTAL SURGERY     EYE SURGERY Bilateral 2011   June and August of 2011   ORIF RADIAL FRACTURE Left 11/99   arm fracture, radial no surgery   TONSILLECTOMY      Home Medications:  Allergies as of 03/07/2021       Reactions   Clindamycin/lincomycin Swelling   Throat swelling   Codeine    Unknown per pt   Keflex [cephalexin]    dizzy   Levofloxacin    Unknown per pt   Rofecoxib    Unknown per pt   Sulfa Antibiotics    Valtrex [valacyclovir Hcl]    Unknown per pt   Adhesive [tape] Rash   Nitrofurantoin Nausea Only   Penicillins Swelling   mouth swelling   Raloxifene Rash   Tramadol    dizzy        Medication List        Accurate as of March 06, 2021  8:29 AM. If you have any questions, ask your nurse or doctor.          Alum Hydroxide-Mag Carbonate 160-105 MG Chew Chew 1 tablet by mouth daily as needed (for heartburn).   atenolol 50 MG tablet Commonly known as: TENORMIN TAKE 1 TABLET BY MOUTH EVERY DAY   AYR SALINE  NASAL NA Place 1 spray into the nose daily as needed (allergies).   clotrimazole-betamethasone cream Commonly known as: LOTRISONE Apply 1 application topically as needed.   estradiol 0.1 MG/GM vaginal cream Commonly known as: ESTRACE Vaginal Estrogen Cream apply a blueberry sized amount to vaginal opening using finger tip 3 times weekly before bed.   fluorometholone 0.1 % ophthalmic suspension Commonly known as: FML Place 1 drop into both eyes 2 (two) times daily.   fluticasone 50 MCG/ACT nasal spray Commonly known as: FLONASE Place 2 sprays into both nostrils daily.   hydrocortisone 25 MG suppository Commonly known as: ANUSOL-HC Place 1 suppository (25 mg total) rectally 2 (two) times daily as needed for hemorrhoids or anal itching.   loperamide 2 MG capsule Commonly known as: IMODIUM Take 2 mg by mouth as needed for diarrhea or loose stools (has been taking with  Clindamycin).   loratadine 10 MG tablet Commonly known as: CLARITIN Take 10 mg by mouth as needed for allergies or rhinitis.   multivitamin capsule Take 1 capsule by mouth daily.   simvastatin 20 MG tablet Commonly known as: ZOCOR TAKE 1 TABLET BY MOUTH EVERY DAY        Allergies:  Allergies  Allergen Reactions   Clindamycin/Lincomycin Swelling    Throat swelling   Codeine     Unknown per pt    Keflex [Cephalexin]     dizzy   Levofloxacin     Unknown per pt    Rofecoxib     Unknown per pt    Sulfa Antibiotics    Valtrex [Valacyclovir Hcl]     Unknown per pt    Adhesive [Tape] Rash   Nitrofurantoin Nausea Only   Penicillins Swelling    mouth swelling   Raloxifene Rash   Tramadol     dizzy    Family History: Family History  Problem Relation Age of Onset   Leukemia Sister    Cancer Sister        leukemia   Heart disease Mother    Diabetes Mother    Heart failure Mother    Stroke Father    Kidney disease Neg Hx    Kidney cancer Neg Hx    Bladder Cancer Neg Hx     Social History:  reports that she has never smoked. She has never used smokeless tobacco. She reports that she does not drink alcohol and does not use drugs.   Physical Exam: There were no vitals taken for this visit.  Constitutional:  Alert and oriented, No acute distress. HEENT: [place] AT, moist mucus membranes.  Trachea midline, no masses. Cardiovascular: No clubbing, cyanosis, or edema. Respiratory: Normal respiratory effort, no increased work of breathing. GI: Abdomen is soft, nontender, nondistended, no abdominal masses GU: No CVA tenderness Lymph: No cervical or inguinal lymphadenopathy. Skin: No rashes, bruises or suspicious lesions. Neurologic: Grossly intact, no focal deficits, moving all 4 extremities. Psychiatric: Normal mood and affect.  Laboratory Data:  Lab Results  Component Value Date   CREATININE 0.56 10/10/2019     Lab Results  Component Value Date   HGBA1C 5.8  10/10/2019    Urinalysis Component     Latest Ref Rng & Units 09/21/2020        12:11 PM  MICRO NUMBER:      [id number]  SPECIMEN QUALITY:      Adequate  Sample Source      NOT GIVEN  STATUS:      FINAL  iSOLATE 1:  Escherichia coli (A)  Comment      Additional non-predominating organism(s) isolated. These organisms, commonly found on external and internal genitalia, are considered colonizers. No further testing  performed.   Susceptibility   Escherichia coli    URINE CULTURE, REFLEX    AMOX/CLAVULANIC Sensitive    AMPICILLIN Sensitive    AMPICILLIN/SULBACTAM Sensitive    CEFAZOLIN Not Reportable 1    CEFEPIME Sensitive    CEFTRIAXONE Sensitive    CIPROFLOXACIN Sensitive    ERTAPENEM Sensitive    GENTAMICIN Sensitive    IMIPENEM Sensitive    LEVOFLOXACIN Sensitive    NITROFURANTOIN Sensitive    PIP/TAZO Sensitive    TOBRAMYCIN Sensitive    TRIMETH/SULFA Sensitive 2     Component     Latest Ref Rng & Units 11/07/2020         3:18 PM  Specific Gravity, UA     1.005 - 1.030 1.010  pH, UA     5.0 - 7.5 5.5  Color, UA     Yellow Yellow  Appearance Ur     Clear Clear  Leukocytes,UA     Negative Negative  Protein,UA     Negative/Trace Negative  Glucose, UA     Negative Negative  Ketones, UA     Negative Negative  RBC, UA     Negative Trace (A)  Bilirubin, UA     Negative Negative  Urobilinogen, Ur     0.2 - 1.0 mg/dL 0.2  Nitrite, UA     Negative Negative  Microscopic Examination      See below:    Pertinent Imaging:    Assessment & Plan:   High risk hematuria rUTIs   No follow-ups on file.  [facility] [address] [place], [place] [address] [phone]

## 2021-03-07 ENCOUNTER — Ambulatory Visit: Payer: Medicare HMO | Admitting: Urology

## 2021-03-20 ENCOUNTER — Other Ambulatory Visit: Payer: Self-pay | Admitting: Family Medicine

## 2021-04-03 ENCOUNTER — Ambulatory Visit: Payer: Medicare HMO

## 2021-04-05 ENCOUNTER — Ambulatory Visit
Admission: RE | Admit: 2021-04-05 | Discharge: 2021-04-05 | Disposition: A | Discharge status: Home / Self Care | Payer: Medicare HMO | Source: Ambulatory Visit | Attending: Urology | Admitting: Urology

## 2021-04-05 DIAGNOSIS — Z87448 Personal history of other diseases of urinary system: Secondary | ICD-10-CM | POA: Insufficient documentation

## 2021-04-05 DIAGNOSIS — N281 Cyst of kidney, acquired: Secondary | ICD-10-CM | POA: Diagnosis not present

## 2021-04-05 DIAGNOSIS — D1771 Benign lipomatous neoplasm of kidney: Secondary | ICD-10-CM | POA: Diagnosis not present

## 2021-04-08 NOTE — Progress Notes (Signed)
04/09/21 10:17 AM   Laural Roes Jun 26, 1926 [id number]  Referring provider:  Abner Greenspan, MD [address] [place],  [place] [address]  Chief Complaint  Patient presents with   Follow-up   Urological history:  High risk hematuria  - non-smoker  -2016 hematuria work-up with [facility] - pathology cystica cystitis  -cysto 10/2020 -negative -no reports of gross hematuria  2. Right renal angiomyolipoma  - RUS 03/2021 1.8 echogenic solid mass lower pole right kidney consisten with history of angiomyolipoma.   3. Urethral caruncle  - managed with vaginal estrogen cream   5. rUTI's  - contributing factors of age and vaginal atrophy - Last urine culture on 10/25/2020 was normal  - Positive urine cultures over the last year  Pansensitive E. Coli on September 21, 2020   HPI: Paula Jimenez is a 87 y.o.female who presents today for follow-up for RUS and exam.   RUS 03/2021 Stable right-sided angiomyolipoma.  Bilateral renal cysts.  She reports she is doing well.    Patient denies any modifying or aggravating factors.  Patient denies any gross hematuria, dysuria or suprapubic/flank pain.  Patient denies any fevers, chills, nausea or vomiting.   PMH: Past Medical History:  Diagnosis Date   Allergy    Asthma    years ago, 2-3 times a year pt experiences a problem with breathing.   Bursitis, hip    Family history of adverse reaction to anesthesia    DAUGHTER had a reaction to ansethesia   Hyperlipidemia    Hypertension    HYPERTENSION, BENIGN 10/26/2006   Qualifier: Diagnosis of  By: Selinda Orion     MITRAL VALVE PROLAPSE 02/25/2007   Qualifier: History of  By: Marcelino Scot CMA, [place] Bilberry     Osteopenia 08/01   Osteoporosis    Skin cancer 2015   nose    Surgical History: Past Surgical History:  Procedure Laterality Date   ABDOMINAL HYSTERECTOMY  1971   BREAST BIOPSY Right 06/99   fibrocystic   CYSTOSCOPY WITH BIOPSY N/A 05/31/2015   Procedure: CYSTOSCOPY WITH  BIOPSY/ fulgeration;  Surgeon: Hollice Espy, MD;  Location: [facility] ORS;  Service: Urology;  Laterality: N/A;   DENTAL SURGERY     EYE SURGERY Bilateral 2011   June and August of 2011   ORIF RADIAL FRACTURE Left 11/99   arm fracture, radial no surgery   TONSILLECTOMY      Home Medications:  Allergies as of 04/09/2021       Reactions   Clindamycin/lincomycin Swelling   Throat swelling   Codeine    Unknown per pt   Keflex [cephalexin]    dizzy   Levofloxacin    Unknown per pt   Rofecoxib    Unknown per pt   Sulfa Antibiotics    Valtrex [valacyclovir Hcl]    Unknown per pt   Adhesive [tape] Rash   Nitrofurantoin Nausea Only   Penicillins Swelling   mouth swelling   Raloxifene Rash   Tramadol    dizzy        Medication List        Accurate as of April 09, 2021 10:17 AM. If you have any questions, ask your nurse or doctor.          Alum Hydroxide-Mag Carbonate 160-105 MG Chew Chew 1 tablet by mouth daily as needed (for heartburn).   atenolol 50 MG tablet Commonly known as: TENORMIN TAKE 1 TABLET BY MOUTH EVERY DAY   AYR SALINE NASAL  NA Place 1 spray into the nose daily as needed (allergies).   clotrimazole-betamethasone cream Commonly known as: LOTRISONE Apply 1 application topically as needed.   estradiol 0.1 MG/GM vaginal cream Commonly known as: ESTRACE Vaginal Estrogen Cream apply a blueberry sized amount to vaginal opening using finger tip 3 times weekly before bed.   fluorometholone 0.1 % ophthalmic suspension Commonly known as: FML Place 1 drop into both eyes 2 (two) times daily.   fluticasone 50 MCG/ACT nasal spray Commonly known as: FLONASE SPRAY 2 SPRAYS INTO EACH NOSTRIL EVERY DAY   hydrocortisone 25 MG suppository Commonly known as: ANUSOL-HC Place 1 suppository (25 mg total) rectally 2 (two) times daily as needed for hemorrhoids or anal itching.   loperamide 2 MG capsule Commonly known as: IMODIUM Take 2 mg by mouth as needed for  diarrhea or loose stools (has been taking with Clindamycin).   loratadine 10 MG tablet Commonly known as: CLARITIN Take 10 mg by mouth as needed for allergies or rhinitis.   multivitamin capsule Take 1 capsule by mouth daily.   simvastatin 20 MG tablet Commonly known as: ZOCOR TAKE 1 TABLET BY MOUTH EVERY DAY        Allergies:  Allergies  Allergen Reactions   Clindamycin/Lincomycin Swelling    Throat swelling   Codeine     Unknown per pt    Keflex [Cephalexin]     dizzy   Levofloxacin     Unknown per pt    Rofecoxib     Unknown per pt    Sulfa Antibiotics    Valtrex [Valacyclovir Hcl]     Unknown per pt    Adhesive [Tape] Rash   Nitrofurantoin Nausea Only   Penicillins Swelling    mouth swelling   Raloxifene Rash   Tramadol     dizzy    Family History: Family History  Problem Relation Age of Onset   Leukemia Sister    Cancer Sister        leukemia   Heart disease Mother    Diabetes Mother    Heart failure Mother    Stroke Father    Kidney disease Neg Hx    Kidney cancer Neg Hx    Bladder Cancer Neg Hx     Social History:  reports that she has never smoked. She has never used smokeless tobacco. She reports that she does not drink alcohol and does not use drugs.   Physical Exam: BP (!) 153/85   Pulse 80   Ht 5\' 4"  (1.626 m)   Wt 101 lb (45.8 kg)   BMI 17.34 kg/m   Constitutional:  Well nourished. Alert and oriented, No acute distress. HEENT: [place] AT, mask in place.  Trachea midline Cardiovascular: No clubbing, cyanosis, or edema. Respiratory: Normal respiratory effort, no increased work of breathing. Neurologic: Grossly intact, no focal deficits, moving all 4 extremities. Psychiatric: Normal mood and affect.    Laboratory Data: N/A   Pertinent Imaging: CLINICAL DATA:  Follow-up angiomyolipoma.   EXAM: RENAL / URINARY TRACT ULTRASOUND COMPLETE   COMPARISON:  03/21/2020   FINDINGS: Right Kidney:   Renal measurements: 10.3 x 3.9 x  4.5 cm. = volume: 86 mL. 1.6 cm hyperechoic focus is again seen in the lower pole of the right kidney consistent with angiomyolipoma. Small 8 mm cyst is noted. This was not well visualized on the prior study.   Left Kidney:   Renal measurements: 9.2 x 4.0 x 3.5 cm. = volume: 66 mL. 8 mm cyst  is noted in the midportion of the left kidney.   Bladder:   Appears normal for degree of bladder distention.   Other:   None.   IMPRESSION: Stable right-sided angiomyolipoma.   Bilateral renal cysts.     Electronically Signed   By: Inez Catalina M.D.   On: 04/08/2021 20:44   I have independently reviewed the films.  See HPI.     Assessment & Plan:    1. Angiomyolipoma -Stable right angiomyolipoma -Renal ultrasound in one year   2. Urethral caruncle -continue vaginal estrogen cream three nights weekly  Return in about 1 year (around 04/09/2022) for RUS report .  [facility] [address] [place], [place] [address] [phone]

## 2021-04-09 ENCOUNTER — Encounter: Payer: Self-pay | Admitting: Urology

## 2021-04-09 ENCOUNTER — Ambulatory Visit: Payer: Medicare HMO | Admitting: Urology

## 2021-04-09 ENCOUNTER — Other Ambulatory Visit: Payer: Self-pay

## 2021-04-09 VITALS — BP 153/85 | HR 80 | Ht 64.0 in | Wt 101.0 lb

## 2021-04-09 DIAGNOSIS — D179 Benign lipomatous neoplasm, unspecified: Secondary | ICD-10-CM | POA: Diagnosis not present

## 2021-04-09 MED ORDER — ESTRADIOL 0.1 MG/GM VA CREA
TOPICAL_CREAM | VAGINAL | 6 refills | Status: DC
Start: 2021-04-09 — End: 2021-11-26

## 2021-05-26 ENCOUNTER — Other Ambulatory Visit: Payer: Self-pay | Admitting: Family Medicine

## 2021-06-25 ENCOUNTER — Ambulatory Visit: Payer: Medicare HMO | Admitting: Family Medicine

## 2021-07-19 ENCOUNTER — Other Ambulatory Visit: Payer: Self-pay

## 2021-07-19 ENCOUNTER — Emergency Department
Admission: EM | Admit: 2021-07-19 | Discharge: 2021-07-19 | Disposition: A | Discharge status: Home / Self Care | Payer: Medicare HMO | Source: Home / Self Care

## 2021-07-19 ENCOUNTER — Encounter: Payer: Self-pay | Admitting: Emergency Medicine

## 2021-07-19 DIAGNOSIS — R059 Cough, unspecified: Secondary | ICD-10-CM

## 2021-07-19 DIAGNOSIS — J0100 Acute maxillary sinusitis, unspecified: Secondary | ICD-10-CM | POA: Diagnosis not present

## 2021-07-19 DIAGNOSIS — J309 Allergic rhinitis, unspecified: Secondary | ICD-10-CM

## 2021-07-19 MED ORDER — FEXOFENADINE HCL 180 MG PO TABS: 180.0000 mg | ORAL_TABLET | Freq: Every day | ORAL | 0 refills | Status: DC

## 2021-07-19 MED ORDER — DOXYCYCLINE HYCLATE 100 MG PO CAPS: 100.0000 mg | ORAL_CAPSULE | Freq: Two times a day (BID) | ORAL | 0 refills | Status: AC

## 2021-07-19 MED ORDER — BENZONATATE 200 MG PO CAPS: 200.0000 mg | ORAL_CAPSULE | Freq: Three times a day (TID) | ORAL | 0 refills | Status: AC | PRN

## 2021-07-19 NOTE — Discharge Instructions (Addendum)
Advised patient to take medication as directed with food to completion.  Advised patient to take Allegra with first dose of Doxycycline for the next 5 of 7 days. Advised may use Allegra afterwards as needed for concurrent postnasal drip/drainage.  Advised patient may use Tessalon Perles daily or as needed for cough.  Encouraged patient to increase daily water intake while taking these medications.

## 2021-07-19 NOTE — ED Provider Notes (Signed)
[name]    CSN: [id number] Arrival date & time: 07/19/21  1516      History   Chief Complaint Chief Complaint  Patient presents with   Cough    HPI ZAKAIYA LARES is a 88 y.o. female.   HPI 88 year old female presents with cough, lightheaded, headache, runny nose and congestion with no appetite for 1 month.  Patient is unvaccinated for COVID-19 and influenza.  PMH significant for HTN, mitral valve prolapse, and dizziness.  Patient is accompanied by her daughter this afternoon.  Past Medical History:  Diagnosis Date   Allergy    Asthma    years ago, 2-3 times a year pt experiences a problem with breathing.   Bursitis, hip    Family history of adverse reaction to anesthesia    DAUGHTER had a reaction to ansethesia   Hyperlipidemia    Hypertension    HYPERTENSION, BENIGN 10/26/2006   Qualifier: Diagnosis of  By: Selinda Orion     MITRAL VALVE PROLAPSE 02/25/2007   Qualifier: History of  By: Marcelino Scot CMA, [place] Bilberry     Osteopenia 08/01   Osteoporosis    Skin cancer 2015   nose    Patient Active Problem List   Diagnosis Date Noted   Varicose vein of leg 10/18/2020   Vaginal discharge 04/19/2020   Pelvic pressure in female 04/19/2020   Fatigue 05/30/2019   Onychomycosis of toenail 03/02/2019   Urethral caruncle 12/10/2018   Acute cystitis 11/29/2018   Elevated random blood glucose level 08/25/2018   Medicare annual wellness visit, subsequent 08/25/2018   Loose stools 06/28/2018   Internal hemorrhoid, bleeding 04/20/2018   Corn of toe 04/20/2018   Ingrown nail of great toe of right foot 11/05/2017   Routine general medical examination at a health care facility 08/07/2016   Frequent UTI 02/16/2015   Epistaxis, recurrent 02/28/2014   History of shingles 02/20/2014   Dizziness 01/06/2014   Hearing loss 09/23/2007   History of melanoma 02/25/2007   Hyperlipidemia 02/25/2007   MITRAL VALVE PROLAPSE 02/25/2007   ALLERGIC RHINITIS 02/25/2007    DEGENERATIVE [place] DISEASE, LUMBOSACRAL SPINE 02/25/2007   Age related osteoporosis 02/25/2007   HYPERTENSION, BENIGN 10/26/2006    Past Surgical History:  Procedure Laterality Date   ABDOMINAL HYSTERECTOMY  1971   BREAST BIOPSY Right 06/99   fibrocystic   CYSTOSCOPY WITH BIOPSY N/A 05/31/2015   Procedure: CYSTOSCOPY WITH BIOPSY/ fulgeration;  Surgeon: Hollice Espy, MD;  Location: [facility] ORS;  Service: Urology;  Laterality: N/A;   DENTAL SURGERY     EYE SURGERY Bilateral 2011   June and August of 2011   ORIF RADIAL FRACTURE Left 11/99   arm fracture, radial no surgery   TONSILLECTOMY      OB History   No obstetric history on file.      Home Medications    Prior to Admission medications   Medication Sig Start Date End Date Taking? Authorizing Provider  benzonatate (TESSALON) 200 MG capsule Take 1 capsule (200 mg total) by mouth 3 (three) times daily as needed for up to 7 days for cough. 07/19/21 07/26/21 Yes Eliezer Lofts, FNP  doxycycline (VIBRAMYCIN) 100 MG capsule Take 1 capsule (100 mg total) by mouth 2 (two) times daily for 7 days. 07/19/21 07/26/21 Yes Eliezer Lofts, FNP  fexofenadine [facility] ALLERGY) 180 MG tablet Take 1 tablet (180 mg total) by mouth daily for 15 days. 07/19/21 08/03/21 Yes Eliezer Lofts, FNP  Alum Hydroxide-Mag Carbonate 160-105 MG CHEW Chew 1  tablet by mouth daily as needed (for heartburn).     [provider]  atenolol (TENORMIN) 50 MG tablet TAKE 1 TABLET BY MOUTH EVERY DAY 05/27/21   Tower, Wynelle Fanny, MD  AYR SALINE NASAL NA Place 1 spray into the nose daily as needed (allergies).     [provider]  clotrimazole-betamethasone (LOTRISONE) cream Apply 1 application topically as needed.    [provider]  estradiol (ESTRACE) 0.1 MG/GM vaginal cream Vaginal Estrogen Cream apply a blueberry sized amount to vaginal opening using finger tip 3 times weekly before bed. 04/09/21   McGowan, Larene Beach A, PA-C  fluorometholone (FML) 0.1 %  ophthalmic suspension Place 1 drop into both eyes 2 (two) times daily. 08/12/16   [provider]  fluticasone (FLONASE) 50 MCG/ACT nasal spray SPRAY 2 SPRAYS INTO EACH NOSTRIL EVERY DAY 03/20/21   Tower, Wynelle Fanny, MD  hydrocortisone (ANUSOL-HC) 25 MG suppository Place 1 suppository (25 mg total) rectally 2 (two) times daily as needed for hemorrhoids or anal itching. 04/20/18   Tower, Wynelle Fanny, MD  loperamide (IMODIUM) 2 MG capsule Take 2 mg by mouth as needed for diarrhea or loose stools (has been taking with Clindamycin).    [provider]  loratadine (CLARITIN) 10 MG tablet Take 10 mg by mouth as needed for allergies or rhinitis.    [provider]  Multiple Vitamin (MULTIVITAMIN) capsule Take 1 capsule by mouth daily.    [provider]  simvastatin (ZOCOR) 20 MG tablet TAKE 1 TABLET BY MOUTH EVERY DAY 05/27/21   Tower, Wynelle Fanny, MD    Family History Family History  Problem Relation Age of Onset   Leukemia Sister    Cancer Sister        leukemia   Heart disease Mother    Diabetes Mother    Heart failure Mother    Stroke Father    Kidney disease Neg Hx    Kidney cancer Neg Hx    Bladder Cancer Neg Hx     Social History Social History   Tobacco Use   Smoking status: Never   Smokeless tobacco: Never  Vaping Use   Vaping Use: Never used  Substance Use Topics   Alcohol use: No    Alcohol/week: 0.0 standard drinks   Drug use: No     Allergies   Clindamycin/lincomycin, Codeine, Keflex [cephalexin], Levofloxacin, Rofecoxib, Sulfa antibiotics, Valtrex [valacyclovir hcl], Adhesive [tape], Nitrofurantoin, Penicillins, Raloxifene, and Tramadol   Review of Systems Review of Systems  HENT:  Positive for congestion and rhinorrhea.   Respiratory:  Positive for cough.   Neurological:  Positive for dizziness and headaches.    Physical Exam Triage Vital Signs ED Triage Vitals  Enc Vitals Group     BP 07/19/21 1610 (!) 169/78     Pulse Rate  07/19/21 1610 74     Resp 07/19/21 1610 16     Temp 07/19/21 1610 98.4 F (36.9 C)     Temp Source 07/19/21 1610 Oral     SpO2 07/19/21 1610 95 %     Weight 07/19/21 1611 100 lb (45.4 kg)     Height 07/19/21 1611 5\' 4"  (1.626 m)     Head Circumference --      Peak Flow --      Pain Score 07/19/21 1611 0     Pain Loc --      Pain Edu? --      Excl. in [place]? --    No data  found.  Updated Vital Signs BP (!) 169/78 (BP Location: Left Arm)    Pulse 74    Temp 98.4 F (36.9 C) (Oral)    Resp 16    Ht 5\' 4"  (1.626 m)    Wt 100 lb (45.4 kg)    SpO2 95%    BMI 17.16 kg/m      Physical Exam Vitals and nursing note reviewed.  Constitutional:      Appearance: Normal appearance. She is normal weight.  HENT:     Head: Normocephalic and atraumatic.     Right Ear: Tympanic membrane and external ear normal.     Left Ear: Tympanic membrane and external ear normal.     Ears:     Comments: Eustachian tube dysfunction noted bilaterally    Mouth/Throat:     Mouth: Mucous membranes are moist.     Pharynx: Oropharynx is clear.     Comments: Moderate amount of clear drainage of posterior oropharynx noted Eyes:     Extraocular Movements: Extraocular movements intact.     Conjunctiva/sclera: Conjunctivae normal.     Pupils: Pupils are equal, round, and reactive to light.  Cardiovascular:     Rate and Rhythm: Normal rate and regular rhythm.     Pulses: Normal pulses.     Heart sounds: Normal heart sounds.  Pulmonary:     Effort: Pulmonary effort is normal.     Breath sounds: Normal breath sounds.     Comments: Infrequent nonproductive cough noted on exam Musculoskeletal:     Cervical back: Normal range of motion and neck supple.  Skin:    General: Skin is warm and dry.  Neurological:     General: No focal deficit present.     Mental Status: She is alert and oriented to person, place, and time.     UC Treatments / Results  Labs (all labs ordered are listed, but only abnormal results  are displayed) Labs Reviewed - No data to display  EKG   Radiology No results found.  Procedures Procedures (including critical care time)  Medications Ordered in UC Medications - No data to display  Initial Impression / Assessment and Plan / UC Course  I have reviewed the triage vital signs and the nursing notes.  Pertinent labs & imaging results that were available during my care of the patient were reviewed by me and considered in my medical decision making (see chart for details).     MDM: 1.  Subacute maxillary sinusitis-Rx'd Doxycycline; 2.  Cough-Rx'd Tessalon Perles; 3.  Allergic rhinitis-Allegra. Advised patient to take medication as directed with food to completion.  Advised patient to take Allegra with first dose of Doxycycline for the next 5 of 7 days. Advised may use Allegra afterwards as needed for concurrent postnasal drip/drainage.  Advised patient may use Tessalon Perles daily or as needed for cough.  Encouraged patient to increase daily water intake while taking these medications.  Discharged home, hemodynamically stable. Final Clinical Impressions(s) / UC Diagnoses   Final diagnoses:  Subacute maxillary sinusitis  Cough, unspecified type  Allergic rhinitis, unspecified seasonality, unspecified trigger     Discharge Instructions      Advised patient to take medication as directed with food to completion.  Advised patient to take Allegra with first dose of Doxycycline for the next 5 of 7 days. Advised may use Allegra afterwards as needed for concurrent postnasal drip/drainage.  Advised patient may use Tessalon Perles daily or as needed for cough.  Encouraged patient  to increase daily water intake while taking these medications.     ED Prescriptions     Medication Sig Dispense Auth. Provider   doxycycline (VIBRAMYCIN) 100 MG capsule Take 1 capsule (100 mg total) by mouth 2 (two) times daily for 7 days. 14 capsule Eliezer Lofts, FNP   fexofenadine [facility]  ALLERGY) 180 MG tablet Take 1 tablet (180 mg total) by mouth daily for 15 days. 15 tablet Eliezer Lofts, FNP   benzonatate (TESSALON) 200 MG capsule Take 1 capsule (200 mg total) by mouth 3 (three) times daily as needed for up to 7 days for cough. 40 capsule Eliezer Lofts, FNP      PDMP not reviewed this encounter.   Eliezer Lofts, [place] 07/19/21 1653

## 2021-07-19 NOTE — ED Triage Notes (Signed)
Cough, light headed, headache, runny nose, congestion, no appetite x 1 month. Unvaccinated, No Flu shot

## 2021-08-05 ENCOUNTER — Ambulatory Visit (INDEPENDENT_AMBULATORY_CARE_PROVIDER_SITE_OTHER): Payer: Self-pay | Admitting: Family Medicine

## 2021-08-05 ENCOUNTER — Other Ambulatory Visit: Payer: Self-pay

## 2021-08-05 ENCOUNTER — Encounter: Payer: Self-pay | Admitting: Family Medicine

## 2021-08-05 VITALS — BP 145/70 | HR 79 | Temp 98.2°F | Ht 64.0 in | Wt 104.0 lb

## 2021-08-05 DIAGNOSIS — I10 Essential (primary) hypertension: Secondary | ICD-10-CM

## 2021-08-05 DIAGNOSIS — J301 Allergic rhinitis due to pollen: Secondary | ICD-10-CM

## 2021-08-05 NOTE — Patient Instructions (Addendum)
Get generic zyrtec 10 mg and take it daily at bedtime  Use as needed, it is ok to take daily in allergy season   Continue your flonase daily  The saline nasal spray is ok to use any time you want   Try to control dust and allergens when you can -please do   Let us know if nasal symptoms or cough worsen   Runny nose when you eat is common and hard to control  The allergy medicines may help a little  Wind/air and temperature change can also cause nose to run

## 2021-08-05 NOTE — Progress Notes (Signed)
Subjective:    Patient ID: Paula Jimenez, female    DOB: 10-Aug-1926, 88 y.o.   MRN: [id number]  This visit occurred during the SARS-CoV-2 public health emergency.  Safety protocols were in place, including screening questions prior to the visit, additional usage of staff PPE, and extensive cleaning of exam room while observing appropriate contact time as indicated for disinfecting solutions.   HPI Pt presents with sinus problems    Wt Readings from Last 3 Encounters:  08/05/21 104 lb (47.2 kg)  07/19/21 100 lb (45.4 kg)  04/09/21 101 lb (45.8 kg)   17.85 kg/m   Sees urology-no longer having trouble  Has h/o high risk hematuria , right renal angiomyolipoma R kidney , urethral caruncle and freq uits  Uses vaginal est cream  She drinks lots of water  Was seen in UC 1/27 Tx with sinusitis with doxycycline    This am noted runny nose  This worsens when she eats  Coughs if she gets hot/has to clear her throat  Wanted to get a re check  No wheeze No sob  Back of throat feels warm   Using flonase  Claritin did not helps  Has not tried the allegra yet -it was recommended to her  Seasonal allergies  [place]   All mucous is clear   Felt some rattling with cough-now that is improved    Patient Active Problem List   Diagnosis Date Noted   Varicose vein of leg 10/18/2020   Vaginal discharge 04/19/2020   Pelvic pressure in female 04/19/2020   Fatigue 05/30/2019   Onychomycosis of toenail 03/02/2019   Urethral caruncle 12/10/2018   Acute cystitis 11/29/2018   Elevated random blood glucose level 08/25/2018   Medicare annual wellness visit, subsequent 08/25/2018   Loose stools 06/28/2018   Internal hemorrhoid, bleeding 04/20/2018   Corn of toe 04/20/2018   Ingrown nail of great toe of right foot 11/05/2017   Routine general medical examination at a health care facility 08/07/2016   Frequent UTI 02/16/2015   Epistaxis, recurrent 02/28/2014    History of shingles 02/20/2014   Dizziness 01/06/2014   Hearing loss 09/23/2007   History of melanoma 02/25/2007   Hyperlipidemia 02/25/2007   MITRAL VALVE PROLAPSE 02/25/2007   Allergic rhinitis 02/25/2007   DEGENERATIVE [place] DISEASE, LUMBOSACRAL SPINE 02/25/2007   Age related osteoporosis 02/25/2007   HYPERTENSION, BENIGN 10/26/2006   Past Medical History:  Diagnosis Date   Allergy    Asthma    years ago, 2-3 times a year pt experiences a problem with breathing.   Bursitis, hip    Family history of adverse reaction to anesthesia    DAUGHTER had a reaction to ansethesia   Hyperlipidemia    Hypertension    HYPERTENSION, BENIGN 10/26/2006   Qualifier: Diagnosis of  By: Selinda Orion     MITRAL VALVE PROLAPSE 02/25/2007   Qualifier: History of  By: Marcelino Scot CMA, [place] Bilberry     Osteopenia 08/01   Osteoporosis    Skin cancer 2015   nose   Past Surgical History:  Procedure Laterality Date   ABDOMINAL HYSTERECTOMY  1971   BREAST BIOPSY Right 06/99   fibrocystic   CYSTOSCOPY WITH BIOPSY N/A 05/31/2015   Procedure: CYSTOSCOPY WITH BIOPSY/ fulgeration;  Surgeon: Hollice Espy, MD;  Location: [facility] ORS;  Service: Urology;  Laterality: N/A;   DENTAL SURGERY     EYE SURGERY Bilateral 2011   June and August of 2011  ORIF RADIAL FRACTURE Left 11/99   arm fracture, radial no surgery   TONSILLECTOMY     Social History   Tobacco Use   Smoking status: Never   Smokeless tobacco: Never  Vaping Use   Vaping Use: Never used  Substance Use Topics   Alcohol use: No    Alcohol/week: 0.0 standard drinks   Drug use: No   Family History  Problem Relation Age of Onset   Leukemia Sister    Cancer Sister        leukemia   Heart disease Mother    Diabetes Mother    Heart failure Mother    Stroke Father    Kidney disease Neg Hx    Kidney cancer Neg Hx    Bladder Cancer Neg Hx    Allergies  Allergen Reactions   Clindamycin/Lincomycin Swelling    Throat swelling   Codeine      Unknown per pt    Keflex [Cephalexin]     dizzy   Levofloxacin     Unknown per pt    Rofecoxib     Unknown per pt    Sulfa Antibiotics    Valtrex [Valacyclovir Hcl]     Unknown per pt    Adhesive [Tape] Rash   Nitrofurantoin Nausea Only   Penicillins Swelling    mouth swelling   Raloxifene Rash   Tramadol     dizzy   Current Outpatient Medications on File Prior to Visit  Medication Sig Dispense Refill   atenolol (TENORMIN) 50 MG tablet TAKE 1 TABLET BY MOUTH EVERY DAY 90 tablet 0   AYR SALINE NASAL NA Place 1 spray into the nose daily as needed (allergies).      estradiol (ESTRACE) 0.1 MG/GM vaginal cream Vaginal Estrogen Cream apply a blueberry sized amount to vaginal opening using finger tip 3 times weekly before bed. 42.5 g 6   fluorometholone (FML) 0.1 % ophthalmic suspension Place 1 drop into both eyes 2 (two) times daily.     fluticasone (FLONASE) 50 MCG/ACT nasal spray SPRAY 2 SPRAYS INTO EACH NOSTRIL EVERY DAY 48 mL 1   hydrocortisone (ANUSOL-HC) 25 MG suppository Place 1 suppository (25 mg total) rectally 2 (two) times daily as needed for hemorrhoids or anal itching. 12 suppository 1   loperamide (IMODIUM) 2 MG capsule Take 2 mg by mouth as needed for diarrhea or loose stools (has been taking with Clindamycin).     Multiple Vitamin (MULTIVITAMIN) capsule Take 1 capsule by mouth daily.     simvastatin (ZOCOR) 20 MG tablet TAKE 1 TABLET BY MOUTH EVERY DAY 90 tablet 0   Alum Hydroxide-Mag Carbonate 160-105 MG CHEW Chew 1 tablet by mouth daily as needed (for heartburn).  (Patient not taking: Reported on 08/05/2021)     clotrimazole-betamethasone (LOTRISONE) cream Apply 1 application topically as needed. (Patient not taking: Reported on 08/05/2021)     No current facility-administered medications on file prior to visit.     Review of Systems  Constitutional:  Negative for activity change, appetite change, fatigue, fever and unexpected weight change.  HENT:  Positive for  postnasal drip, rhinorrhea and sneezing. Negative for congestion, ear pain, sinus pressure, sore throat, trouble swallowing and voice change.   Eyes:  Negative for pain, redness and visual disturbance.  Respiratory:  Positive for cough. Negative for shortness of breath, wheezing and stridor.   Cardiovascular:  Negative for chest pain and palpitations.  Gastrointestinal:  Negative for abdominal pain, blood in stool, constipation and diarrhea.  Endocrine: Negative for polydipsia and polyuria.  Genitourinary:  Negative for dysuria, frequency and urgency.  Musculoskeletal:  Negative for arthralgias, back pain and myalgias.  Skin:  Negative for pallor and rash.  Allergic/Immunologic: Negative for environmental allergies.  Neurological:  Negative for dizziness, syncope and headaches.  Hematological:  Negative for adenopathy. Does not bruise/bleed easily.  Psychiatric/Behavioral:  Negative for decreased concentration and dysphoric mood. The patient is not nervous/anxious.       Objective:   Physical Exam Constitutional:      General: She is not in acute distress.    Appearance: Normal appearance. She is normal weight. She is not ill-appearing.  HENT:     Head: Normocephalic and atraumatic.     Comments: No sinus tenderness    Right Ear: Tympanic membrane, ear canal and external ear normal.     Left Ear: Tympanic membrane, ear canal and external ear normal.     Nose: Rhinorrhea present.     Comments: Boggy nares    Mouth/Throat:     Mouth: Mucous membranes are moist.     Pharynx: Oropharynx is clear. No oropharyngeal exudate or posterior oropharyngeal erythema.  Eyes:     General:        Right eye: No discharge.        Left eye: No discharge.     Conjunctiva/sclera: Conjunctivae normal.     Pupils: Pupils are equal, round, and reactive to light.  Cardiovascular:     Rate and Rhythm: Normal rate and regular rhythm.     Pulses: Normal pulses.     Heart sounds: Normal heart sounds.   Musculoskeletal:     Cervical back: Normal range of motion and neck supple.  Lymphadenopathy:     Cervical: No cervical adenopathy.  Skin:    Coloration: Skin is not pale.     Findings: No rash.  Neurological:     Mental Status: She is alert.     Cranial Nerves: No cranial nerve deficit.  Psychiatric:        Mood and Affect: Mood normal.          Assessment & Plan:    Problem List Items Addressed This Visit       Cardiovascular and Mediastinum   HYPERTENSION, BENIGN    bp was higher when walking in then improved after sitting bp in fair control at this time  BP Readings from Last 1 Encounters:  08/05/21 (!) 145/70   No changes needed Most recent labs reviewed  Disc lifstyle change with low sodium diet and exercise  Plan to continue atnenolol 50 mg daily        Respiratory   Allergic rhinitis - Primary    Runny nose with clear mucous and pnd  Recent bacterial sinus symptoms are resolved after doxycycline  Disc allergen avoidance  Handout given  Trial of zyrtec 10 mg daily otc (instead of claritin) Continue flonase ns  Update if not starting to improve in a week or if worsening

## 2021-08-05 NOTE — Assessment & Plan Note (Signed)
bp was higher when walking in then improved after sitting bp in fair control at this time  BP Readings from Last 1 Encounters:  08/05/21 (!) 145/70   No changes needed Most recent labs reviewed  Disc lifstyle change with low sodium diet and exercise  Plan to continue atnenolol 50 mg daily

## 2021-08-05 NOTE — Assessment & Plan Note (Addendum)
Runny nose with clear mucous and pnd  Recent bacterial sinus symptoms are resolved after doxycycline  Disc allergen avoidance  Handout given  Trial of zyrtec 10 mg daily otc (instead of claritin) Continue flonase ns  Update if not starting to improve in a week or if worsening

## 2021-08-29 ENCOUNTER — Other Ambulatory Visit: Payer: Self-pay | Admitting: Family Medicine

## 2021-09-12 ENCOUNTER — Ambulatory Visit: Payer: Medicare HMO | Admitting: Podiatry

## 2021-09-12 ENCOUNTER — Encounter: Payer: Self-pay | Admitting: Podiatry

## 2021-09-12 ENCOUNTER — Other Ambulatory Visit: Payer: Self-pay

## 2021-09-12 DIAGNOSIS — M79609 Pain in unspecified limb: Secondary | ICD-10-CM | POA: Diagnosis not present

## 2021-09-12 DIAGNOSIS — B351 Tinea unguium: Secondary | ICD-10-CM

## 2021-09-12 NOTE — Progress Notes (Signed)
Complaint:  ?Visit Type: Patient returns to my office for continued preventative foot care services. Complaint: Patient states" my nails have grown long and thick and become painful to walk and wear shoes" Patient is wearing pad under her big toe joint right foot to reduce pain. The patient presents for preventative foot care services. No changes to ROS ? ?Podiatric Exam: ?Vascular: dorsalis pedis and posterior tibial pulses are weakly  palpable bilateral. Capillary return is immediate. Cold feet.  Absent digital hair.. Skin turgor WNL  ?Sensorium: Normal Semmes Weinstein monofilament test. Normal tactile sensation bilaterally. ?Nail Exam: Pt has thick disfigured discolored nails with subungual debris noted bilateral entire nail hallux through fifth toenails ?Ulcer Exam: There is no evidence of ulcer or pre-ulcerative changes or infection. ?Orthopedic Exam: Muscle tone and strength are WNL. No limitations in general ROM. No crepitus or effusions noted. Foot type and digits show no abnormalities. Bony prominences are unremarkable.  Prominent sesamoid bone sub 1st MPJ  right foot. ?Skin: No Porokeratosis. No infection or ulcers ? ?Diagnosis:  ?Onychomycosis, , Pain in right toe, pain in left toes ? ?Treatment & Plan ?Procedures and Treatment: Consent by patient was obtained for treatment procedures.   Debridement of mycotic and hypertrophic toenails, 1 through 5 bilateral and clearing of subungual debris. No ulceration, no infection noted.  ?Return Visit-Office Procedure: Patient instructed to return to the office for a follow up visit 6 months for continued evaluation and treatment. ? ? ? ?Gardiner Barefoot DPM ?

## 2021-09-30 ENCOUNTER — Ambulatory Visit (INDEPENDENT_AMBULATORY_CARE_PROVIDER_SITE_OTHER): Payer: Self-pay | Admitting: Family Medicine

## 2021-09-30 ENCOUNTER — Encounter: Payer: Self-pay | Admitting: Family Medicine

## 2021-09-30 DIAGNOSIS — S20469A Insect bite (nonvenomous) of unspecified back wall of thorax, initial encounter: Secondary | ICD-10-CM | POA: Insufficient documentation

## 2021-09-30 NOTE — Progress Notes (Signed)
? ?Subjective:  ? ? Patient ID: Paula Jimenez, female    DOB: Oct 28, 1926, 88 y.o.   MRN: [id number] ? ?HPI ?Pt presents for redness/itching of left lower back after tick bite  ? ?Wt Readings from Last 3 Encounters:  ?09/30/21 102 lb (46.3 kg)  ?08/05/21 104 lb (47.2 kg)  ?07/19/21 100 lb (45.4 kg)  ? ?17.51 kg/m? ? ?L low back  ?Red/itchy area  ?Did not see tick but she works outside a lot (3 weeks)  ?She did rubbed her back and a tick came off - 2 week / unsure if this is the same  ?It was a round/black tick /dead  ? ?She usually gets multiple tick bites in the summer  ? ? ?No pain or burning  ?Put alcohol on it  ? ?No fever ?Feels fine  ?No rash ?No headaches   (unless she has a sinus infection)  ? ?Some pollen allergies  ?Takes antihistamine on and off  ? ?Patient Active Problem List  ? Diagnosis Date Noted  ? Insect bite of back 09/30/2021  ? Varicose vein of leg 10/18/2020  ? Vaginal discharge 04/19/2020  ? Pelvic pressure in female 04/19/2020  ? Fatigue 05/30/2019  ? Onychomycosis of toenail 03/02/2019  ? Urethral caruncle 12/10/2018  ? Acute cystitis 11/29/2018  ? Elevated random blood glucose level 08/25/2018  ? Medicare annual wellness visit, subsequent 08/25/2018  ? Loose stools 06/28/2018  ? Internal hemorrhoid, bleeding 04/20/2018  ? Corn of toe 04/20/2018  ? Ingrown nail of great toe of right foot 11/05/2017  ? Routine general medical examination at a health care facility 08/07/2016  ? Frequent UTI 02/16/2015  ? Epistaxis, recurrent 02/28/2014  ? History of shingles 02/20/2014  ? Dizziness 01/06/2014  ? Hearing loss 09/23/2007  ? History of melanoma 02/25/2007  ? Hyperlipidemia 02/25/2007  ? MITRAL VALVE PROLAPSE 02/25/2007  ? Allergic rhinitis 02/25/2007  ? [place] DISEASE, LUMBOSACRAL SPINE 02/25/2007  ? Age related osteoporosis 02/25/2007  ? HYPERTENSION, BENIGN 10/26/2006  ? ?Past Medical History:  ?Diagnosis Date  ? Allergy   ? Asthma   ? years ago, 2-3 times a year pt experiences a  problem with breathing.  ? Bursitis, hip   ? Family history of adverse reaction to anesthesia   ? DAUGHTER had a reaction to ansethesia  ? Hyperlipidemia   ? Hypertension   ? HYPERTENSION, BENIGN 10/26/2006  ? Qualifier: Diagnosis of  By: Selinda Orion    ? MITRAL VALVE PROLAPSE 02/25/2007  ? Qualifier: History of  By: Marcelino Scot CMA, [place] Bilberry    ? Osteopenia 08/01  ? Osteoporosis   ? Skin cancer 2015  ? nose  ? ?Past Surgical History:  ?Procedure Laterality Date  ? ABDOMINAL HYSTERECTOMY  1971  ? BREAST BIOPSY Right 06/99  ? fibrocystic  ? CYSTOSCOPY WITH BIOPSY N/A 05/31/2015  ? Procedure: CYSTOSCOPY WITH BIOPSY/ fulgeration;  Surgeon: Hollice Espy, MD;  Location: [facility] ORS;  Service: Urology;  Laterality: N/A;  ? DENTAL SURGERY    ? EYE SURGERY Bilateral 2011  ? June and August of 2011  ? ORIF RADIAL FRACTURE Left 11/99  ? arm fracture, radial no surgery  ? TONSILLECTOMY    ? ?Social History  ? ?Tobacco Use  ? Smoking status: Never  ? Smokeless tobacco: Never  ?Vaping Use  ? Vaping Use: Never used  ?Substance Use Topics  ? Alcohol use: No  ?  Alcohol/week: 0.0 standard drinks  ? Drug use: No  ? ?Family History  ?  Problem Relation Age of Onset  ? Leukemia Sister   ? Cancer Sister   ?     leukemia  ? Heart disease Mother   ? Diabetes Mother   ? Heart failure Mother   ? Stroke Father   ? Kidney disease Neg Hx   ? Kidney cancer Neg Hx   ? Bladder Cancer Neg Hx   ? ?Allergies  ?Allergen Reactions  ? Clindamycin/Lincomycin Swelling  ?  Throat swelling  ? Codeine   ?  Unknown per pt ?  ? Keflex [Cephalexin]   ?  dizzy  ? Levofloxacin   ?  Unknown per pt ?  ? Rofecoxib   ?  Unknown per pt ?  ? Sulfa Antibiotics   ? Valtrex [Valacyclovir Hcl]   ?  Unknown per pt ?  ? Adhesive [Tape] Rash  ? Nitrofurantoin Nausea Only  ? Penicillins Swelling  ?  mouth swelling  ? Raloxifene Rash  ? Tramadol   ?  dizzy  ? ?Current Outpatient Medications on File Prior to Visit  ?Medication Sig Dispense Refill  ? atenolol (TENORMIN) 50 MG tablet  TAKE 1 TABLET BY MOUTH EVERY DAY 90 tablet 1  ? AYR SALINE NASAL NA Place 1 spray into the nose daily as needed (allergies).     ? clotrimazole-betamethasone (LOTRISONE) cream Apply 1 application. topically as needed.    ? fluorometholone (FML) 0.1 % ophthalmic suspension Place 1 drop into both eyes 2 (two) times daily.    ? fluticasone (FLONASE) 50 MCG/ACT nasal spray SPRAY 2 SPRAYS INTO EACH NOSTRIL EVERY DAY 48 mL 1  ? hydrocortisone (ANUSOL-HC) 25 MG suppository Place 1 suppository (25 mg total) rectally 2 (two) times daily as needed for hemorrhoids or anal itching. 12 suppository 1  ? loperamide (IMODIUM) 2 MG capsule Take 2 mg by mouth as needed for diarrhea or loose stools (has been taking with Clindamycin).    ? Multiple Vitamin (MULTIVITAMIN) capsule Take 1 capsule by mouth daily.    ? simvastatin (ZOCOR) 20 MG tablet TAKE 1 TABLET BY MOUTH EVERY DAY 90 tablet 1  ? Alum Hydroxide-Mag Carbonate 160-105 MG CHEW Chew 1 tablet by mouth daily as needed (for heartburn).  (Patient not taking: Reported on 09/30/2021)    ? estradiol (ESTRACE) 0.1 MG/GM vaginal cream Vaginal Estrogen Cream apply a blueberry sized amount to vaginal opening using finger tip 3 times weekly before bed. (Patient not taking: Reported on 09/30/2021) 42.5 g 6  ? ?No current facility-administered medications on file prior to visit.  ?  ? ?Review of Systems  ?Constitutional:  Negative for activity change, appetite change, fatigue, fever and unexpected weight change.  ?HENT:  Negative for congestion, ear pain, rhinorrhea, sinus pressure and sore throat.   ?Eyes:  Negative for pain, redness and visual disturbance.  ?Respiratory:  Negative for cough, shortness of breath and wheezing.   ?Cardiovascular:  Negative for chest pain and palpitations.  ?Gastrointestinal:  Negative for abdominal pain, blood in stool, constipation and diarrhea.  ?Endocrine: Negative for polydipsia and polyuria.  ?Genitourinary:  Negative for dysuria, frequency and urgency.   ?Musculoskeletal:  Negative for arthralgias, back pain and myalgias.  ?Skin:  Negative for pallor and rash.  ?     Insect bite?   ?Allergic/Immunologic: Negative for environmental allergies.  ?Neurological:  Negative for dizziness, syncope and headaches.  ?Hematological:  Negative for adenopathy. Does not bruise/bleed easily.  ?Psychiatric/Behavioral:  Negative for decreased concentration and dysphoric mood. The patient is not nervous/anxious.   ? ?   ?  Objective:  ? Physical Exam ?Constitutional:   ?   Appearance: Normal appearance. She is normal weight.  ?HENT:  ?   Mouth/Throat:  ?   Mouth: Mucous membranes are moist.  ?Eyes:  ?   General:     ?   Right eye: No discharge.     ?   Left eye: No discharge.  ?   Conjunctiva/sclera: Conjunctivae normal.  ?   Pupils: Pupils are equal, round, and reactive to light.  ?Cardiovascular:  ?   Rate and Rhythm: Normal rate and regular rhythm.  ?   Heart sounds: Normal heart sounds.  ?Pulmonary:  ?   Effort: Pulmonary effort is normal. No respiratory distress.  ?   Breath sounds: Normal breath sounds. Stridor present. No wheezing.  ?Musculoskeletal:  ?   Cervical back: Neck supple.  ?Lymphadenopathy:  ?   Cervical: No cervical adenopathy.  ?Skin: ?   Comments: 2 mm erythematous papule with scab on L low back  ?No tick /parts seen  ?No excoriation  ? ?No rashes  ?  ?Neurological:  ?   Mental Status: She is alert.  ?   Cranial Nerves: No cranial nerve deficit.  ?Psychiatric:     ?   Mood and Affect: Mood normal.  ? ? ? ? ? ?   ?Assessment & Plan:  ? ?Problem List Items Addressed This Visit   ? ?  ? Musculoskeletal and Integument  ? Insect bite of back  ?  Unsure if tick bite/she is exposed and outdoors  ?Last tick she found was not attached  ?Reassuring exam , no sign of EM or rashes and no constitutional symptoms ? ?inst to keep clean ?Cortisone cream prn ?Watch for change/inc size or any fever or malaise  ?  ?  ? ? ?

## 2021-09-30 NOTE — Patient Instructions (Signed)
Keep the red spot clean with soap and water  ? ?Use over the counter cortisone cream (cort aid or other brand) for itch  ? ?If this gets bigger or painful or you feel sick /fever/headache let us know  ? ? ?

## 2021-09-30 NOTE — Assessment & Plan Note (Addendum)
Unsure if tick bite/she is exposed and outdoors  ?Last tick she found was not attached  ?Reassuring exam , no sign of EM or rashes and no constitutional symptoms ? ?inst to keep clean ?Cortisone cream prn ?Watch for change/inc size or any fever or malaise  ?

## 2021-11-24 DIAGNOSIS — Z883 Allergy status to other anti-infective agents status: Secondary | ICD-10-CM | POA: Diagnosis not present

## 2021-11-24 DIAGNOSIS — I10 Essential (primary) hypertension: Secondary | ICD-10-CM | POA: Diagnosis not present

## 2021-11-24 DIAGNOSIS — Z85820 Personal history of malignant melanoma of skin: Secondary | ICD-10-CM | POA: Diagnosis not present

## 2021-11-24 DIAGNOSIS — R58 Hemorrhage, not elsewhere classified: Secondary | ICD-10-CM | POA: Diagnosis not present

## 2021-11-24 DIAGNOSIS — Z885 Allergy status to narcotic agent status: Secondary | ICD-10-CM | POA: Diagnosis not present

## 2021-11-24 DIAGNOSIS — Z85828 Personal history of other malignant neoplasm of skin: Secondary | ICD-10-CM | POA: Diagnosis not present

## 2021-11-24 DIAGNOSIS — S59902A Unspecified injury of left elbow, initial encounter: Secondary | ICD-10-CM | POA: Diagnosis not present

## 2021-11-24 DIAGNOSIS — W19XXXA Unspecified fall, initial encounter: Secondary | ICD-10-CM | POA: Diagnosis not present

## 2021-11-24 DIAGNOSIS — Z880 Allergy status to penicillin: Secondary | ICD-10-CM | POA: Diagnosis not present

## 2021-11-24 DIAGNOSIS — S42292A Other displaced fracture of upper end of left humerus, initial encounter for closed fracture: Secondary | ICD-10-CM | POA: Diagnosis not present

## 2021-11-24 DIAGNOSIS — Z23 Encounter for immunization: Secondary | ICD-10-CM | POA: Diagnosis not present

## 2021-11-24 DIAGNOSIS — Z882 Allergy status to sulfonamides status: Secondary | ICD-10-CM | POA: Diagnosis not present

## 2021-11-24 DIAGNOSIS — Z881 Allergy status to other antibiotic agents status: Secondary | ICD-10-CM | POA: Diagnosis not present

## 2021-11-24 DIAGNOSIS — R296 Repeated falls: Secondary | ICD-10-CM | POA: Diagnosis not present

## 2021-11-24 DIAGNOSIS — S42295A Other nondisplaced fracture of upper end of left humerus, initial encounter for closed fracture: Secondary | ICD-10-CM | POA: Diagnosis not present

## 2021-11-25 ENCOUNTER — Telehealth: Payer: Self-pay

## 2021-11-25 NOTE — Telephone Encounter (Signed)
[name], [name]' daughter called in wanting to update Dr.Tower that Paula Jimenez fell on Saturday and fractured her L shoulder, a scan was done of head/neck they found something in her head but [name] wasn't sure what it was. Notes are available through [facility]. [name] said she is concerned about [name]. [name] had not been using the bathroom often before and believed a UTI was starting to occur, but a urine test was not collected as she wasn't able to use the bathroom there.

## 2021-11-25 NOTE — Telephone Encounter (Addendum)
I spoke with [name] (DPR signed)  [name] said that pt fell on 11/24/21 after church. Pt is staying with other daughter right now and pt was taken to [facility]. (Should be in care everywhere. [name] said that pt fx her shoulder and has appt 11/26/21 in the morning time to see a ortho surgeon in or near [place]. [name] said her sister said today that pt's lt side of body is bruised. [name] is concerned that pt has a UTI; for couple of weeks pt has complained of low back pain, diarrhea,h/a and for last couple of days pt is not urinating a lot but is still drinking.the ED could not get a urine specimen because pt could not urinate.,[name] said when she talked with her sister today that pt has only voided x1 earlier this morning and has not voided since. Pt also voiding smaller amts than usual when she does urinate. Pt has not admitted to seeing any blood in urine or complained about burning or pain upon urination. [name] said when pt starts having low back pain and not voiding a lot pt usually has a UTI. [name] will speak with her sister in [place] to see if she will take pt to [facility] in [place] now.[name] also would like to know what the M word was that the ED told her sister about when pt had the CT of her head at ED. Fayes sister cannot remember. Sending note to Dr Glori Bickers.  [name] called back and said her sister will take pt to UC after she sees the ortho surgeon in the morning and [name] said pt is allergic to a lot of abx and wants to know what Dr Glori Bickers said has worked the best for the pt in the past for abx. For UTI. I explained that after the culture is done that will indicate which abx the bacteria is susceptible to but [name] said she still wants to know Dr Marliss Coots recommendation of abx for UTI that has worked in the past and pt is not allergic to. Pts daughter she is staying with now said too far to bring pt to [facility] and she will take pt to UC in [place] instead on 11/26/21.  [name] request cb after reviewed by Dr  Glori Bickers. Sending note to Dr Glori Bickers and will teams Louretta Shorten CMA that is working with Dr Glori Bickers.

## 2021-11-25 NOTE — Telephone Encounter (Signed)
She cannot take most antibiotics in the past I think the last thing she tolerated was cipro It will very much depend on the culture result   Occasionally they will use fosfomycin as well if it is available   It looks like they found and incidental meningioma on her CT We can discuss it once her shoulder is taken care of   Please keep me posted Push fluids if possible

## 2021-11-25 NOTE — Telephone Encounter (Signed)
Called and spoke with her Daughter [name] and gave her  name of medication , she said that she is going to follow up with office in [place]  tomorrow. She is going push fluids for her mother to drink.

## 2021-11-25 NOTE — Telephone Encounter (Signed)
Forward this message to triage

## 2021-11-26 ENCOUNTER — Emergency Department (INDEPENDENT_AMBULATORY_CARE_PROVIDER_SITE_OTHER): Payer: Medicare HMO

## 2021-11-26 ENCOUNTER — Emergency Department
Admission: EM | Admit: 2021-11-26 | Discharge: 2021-11-26 | Disposition: A | Discharge status: Home / Self Care | Payer: Medicare HMO | Source: Home / Self Care

## 2021-11-26 ENCOUNTER — Encounter: Payer: Self-pay | Admitting: Emergency Medicine

## 2021-11-26 ENCOUNTER — Telehealth: Payer: Self-pay | Admitting: Emergency Medicine

## 2021-11-26 DIAGNOSIS — N390 Urinary tract infection, site not specified: Secondary | ICD-10-CM | POA: Diagnosis not present

## 2021-11-26 DIAGNOSIS — R1030 Lower abdominal pain, unspecified: Secondary | ICD-10-CM | POA: Diagnosis not present

## 2021-11-26 DIAGNOSIS — S42201A Unspecified fracture of upper end of right humerus, initial encounter for closed fracture: Secondary | ICD-10-CM | POA: Diagnosis not present

## 2021-11-26 DIAGNOSIS — W19XXXA Unspecified fall, initial encounter: Secondary | ICD-10-CM

## 2021-11-26 DIAGNOSIS — R52 Pain, unspecified: Secondary | ICD-10-CM | POA: Diagnosis not present

## 2021-11-26 DIAGNOSIS — K5900 Constipation, unspecified: Secondary | ICD-10-CM

## 2021-11-26 DIAGNOSIS — R34 Anuria and oliguria: Secondary | ICD-10-CM

## 2021-11-26 DIAGNOSIS — S32009A Unspecified fracture of unspecified lumbar vertebra, initial encounter for closed fracture: Secondary | ICD-10-CM | POA: Diagnosis not present

## 2021-11-26 LAB — POCT URINALYSIS DIP (MANUAL ENTRY)
Bilirubin, UA: NEGATIVE
Glucose, UA: NEGATIVE mg/dL
Ketones, POC UA: NEGATIVE mg/dL
Nitrite, UA: NEGATIVE
Spec Grav, UA: 1.025 (ref 1.010–1.025)
Urobilinogen, UA: 0.2 E.U./dL
pH, UA: 5.5 (ref 5.0–8.0)

## 2021-11-26 NOTE — ED Triage Notes (Signed)
Pt fell at church on Sunday and states since then she has been urinating less frequently and feels like her lower abdomen is sore. Denies dysuria. States she saw the surgeon this am for her arm and he felt like she may have bruised her bladder but daughter denies any imaging or testing done. States she was not having these symptoms on Sunday when she was seen in the ED for fall.

## 2021-11-26 NOTE — Discharge Instructions (Addendum)
Your urine does not indicate a UTI We will send a culture out to confirm that it is negative for bacteria, While awaiting the culture, you can try OTC cystex or cranberry caps. Increased water intake would also be helpful. Your xray indicates a moderate stool burden, suggesting constipation. This can also causing changes to urination. Please start taking one scoop of miralax daily (17g).  There is a possible L sided L3 transverse fracture. Please follow up with your orthopedist to further discuss this.

## 2021-11-26 NOTE — ED Provider Notes (Signed)
[facility]    CSN: [id number] Arrival date & time: 11/26/21  0951      History   Chief Complaint Chief Complaint  Patient presents with   Urinary Frequency   Urinary Retention    HPI Paula Jimenez is a 88 y.o. female.   [name] presents today requesting an eval for possible UTI. She sustained a fall while at church on Sunday. Was seen in the ER later that day and dx with a L humoral head fracture. She has been taking tylenol for the pain. She had a CT head and Cspine, Xray shoulder and elbow in ER which only showed the humoral head fracture, no other acute abnormalities. She had no other concerns on Sunday. Yesterday, she reported a decrease in urination. Denies pain with urination, but not as often as normal. Pt states she normally pees once an hour and gets up twice at night to pee. Yesterday pt was urinating only once every 4 hours. This morning, she woke up with a vague dull pain to her llq/ suprapubic region. She states it is most uncomfortable when standing, but denies any pain with sitting or walking. Denies any pain in her hip. No change in Bms and had a normal BM today. There is no odor, cloudiness or blood in the urine. She has no flank pain. Has a chronically diminished appetite.    Urinary Frequency Associated symptoms include abdominal pain (vague LLQ discomfort).   Past Medical History:  Diagnosis Date   Allergy    Asthma    years ago, 2-3 times a year pt experiences a problem with breathing.   Bursitis, hip    Family history of adverse reaction to anesthesia    DAUGHTER had a reaction to ansethesia   Hyperlipidemia    Hypertension    HYPERTENSION, BENIGN 10/26/2006   Qualifier: Diagnosis of  By: Selinda Orion     MITRAL VALVE PROLAPSE 02/25/2007   Qualifier: History of  By: Marcelino Scot CMA, [place] Bilberry     Osteopenia 08/01   Osteoporosis    Skin cancer 2015   nose    Patient Active Problem List   Diagnosis Date Noted   Insect bite of  back 09/30/2021   Varicose vein of leg 10/18/2020   Vaginal discharge 04/19/2020   Pelvic pressure in female 04/19/2020   Fatigue 05/30/2019   Onychomycosis of toenail 03/02/2019   Urethral caruncle 12/10/2018   Acute cystitis 11/29/2018   Elevated random blood glucose level 08/25/2018   Medicare annual wellness visit, subsequent 08/25/2018   Loose stools 06/28/2018   Internal hemorrhoid, bleeding 04/20/2018   Corn of toe 04/20/2018   Ingrown nail of great toe of right foot 11/05/2017   Routine general medical examination at a health care facility 08/07/2016   Frequent UTI 02/16/2015   Epistaxis, recurrent 02/28/2014   History of shingles 02/20/2014   Dizziness 01/06/2014   Hearing loss 09/23/2007   History of melanoma 02/25/2007   Hyperlipidemia 02/25/2007   MITRAL VALVE PROLAPSE 02/25/2007   Allergic rhinitis 02/25/2007   DEGENERATIVE DISC DISEASE, LUMBOSACRAL SPINE 02/25/2007   Age related osteoporosis 02/25/2007   HYPERTENSION, BENIGN 10/26/2006    Past Surgical History:  Procedure Laterality Date   ABDOMINAL HYSTERECTOMY  1971   BREAST BIOPSY Right 06/99   fibrocystic   CYSTOSCOPY WITH BIOPSY N/A 05/31/2015   Procedure: CYSTOSCOPY WITH BIOPSY/ fulgeration;  Surgeon: Hollice Espy, MD;  Location: [facility] ORS;  Service: Urology;  Laterality: N/A;   DENTAL SURGERY  EYE SURGERY Bilateral 2011   June and August of 2011   ORIF RADIAL FRACTURE Left 11/99   arm fracture, radial no surgery   TONSILLECTOMY      OB History   No obstetric history on file.      Home Medications    Prior to Admission medications   Medication Sig Start Date End Date Taking? Authorizing Provider  atenolol (TENORMIN) 50 MG tablet TAKE 1 TABLET BY MOUTH EVERY DAY 08/29/21   Tower, Wynelle Fanny, MD  AYR SALINE NASAL NA Place 1 spray into the nose daily as needed (allergies).     [provider]  clotrimazole-betamethasone (LOTRISONE) cream Apply 1 application. topically as needed.     [provider]  fluorometholone (FML) 0.1 % ophthalmic suspension Place 1 drop into both eyes 2 (two) times daily. 08/12/16   [provider]  fluticasone (FLONASE) 50 MCG/ACT nasal spray SPRAY 2 SPRAYS INTO EACH NOSTRIL EVERY DAY 03/20/21   Tower, Wynelle Fanny, MD  hydrocortisone (ANUSOL-HC) 25 MG suppository Place 1 suppository (25 mg total) rectally 2 (two) times daily as needed for hemorrhoids or anal itching. 04/20/18   Tower, Wynelle Fanny, MD  loperamide (IMODIUM) 2 MG capsule Take 2 mg by mouth as needed for diarrhea or loose stools (has been taking with Clindamycin).    [provider]  Multiple Vitamin (MULTIVITAMIN) capsule Take 1 capsule by mouth daily.    [provider]  simvastatin (ZOCOR) 20 MG tablet TAKE 1 TABLET BY MOUTH EVERY DAY 08/29/21   Tower, Wynelle Fanny, MD    Family History Family History  Problem Relation Age of Onset   Leukemia Sister    Cancer Sister        leukemia   Heart disease Mother    Diabetes Mother    Heart failure Mother    Stroke Father    Kidney disease Neg Hx    Kidney cancer Neg Hx    Bladder Cancer Neg Hx     Social History Social History   Tobacco Use   Smoking status: Never   Smokeless tobacco: Never  Vaping Use   Vaping Use: Never used  Substance Use Topics   Alcohol use: No    Alcohol/week: 0.0 standard drinks   Drug use: No     Allergies   Clindamycin/lincomycin, Codeine, Keflex [cephalexin], Levofloxacin, Rofecoxib, Sulfa antibiotics, Valtrex [valacyclovir hcl], Adhesive [tape], Nitrofurantoin, Penicillins, Raloxifene, and Tramadol   Review of Systems Review of Systems  Gastrointestinal:  Positive for abdominal pain (vague LLQ discomfort).  Genitourinary:  Positive for decreased urine volume and pelvic pain. Negative for difficulty urinating, dysuria, flank pain and urgency.  Musculoskeletal:  Positive for arthralgias (L shoulder).    Physical Exam Triage Vital Signs ED Triage Vitals  Enc Vitals  Group     BP 11/26/21 1026 (!) 163/90     Pulse Rate 11/26/21 1026 77     Resp 11/26/21 1026 18     Temp 11/26/21 1026 97.9 F (36.6 C)     Temp Source 11/26/21 1026 Oral     SpO2 11/26/21 1026 96 %     Weight 11/26/21 1030 104 lb (47.2 kg)     Height --      Head Circumference --      Peak Flow --      Pain Score 11/26/21 1029 0     Pain Loc --      Pain Edu? --      Excl. in [place]? --  No data found.  Updated Vital Signs BP (!) 163/90 (BP Location: Right Arm)   Pulse 77   Temp 97.9 F (36.6 C) (Oral)   Resp 18   Wt 104 lb (47.2 kg)   SpO2 96%   BMI 17.85 kg/m   Visual Acuity Right Eye Distance:   Left Eye Distance:   Bilateral Distance:    Right Eye Near:   Left Eye Near:    Bilateral Near:     Physical Exam Vitals and nursing note reviewed. Exam conducted with a chaperone present.  Constitutional:      General: She is not in acute distress.    Appearance: Normal appearance. She is not toxic-appearing or diaphoretic.     Comments: Frail, thin  HENT:     Head: Normocephalic.     Comments: Large hematoma and ecchymosis noted to L eye, maxillary region and nasal brige Cardiovascular:     Rate and Rhythm: Normal rate.  Pulmonary:     Effort: Pulmonary effort is normal. No respiratory distress.  Abdominal:     General: Abdomen is flat. Bowel sounds are normal. There is no distension.     Palpations: Abdomen is soft. There is no mass.     Tenderness: There is abdominal tenderness (stated minimal discomfort to deep palpation of LLQ). There is no right CVA tenderness, left CVA tenderness, guarding or rebound.     Hernia: No hernia is present.     Comments: Percussion tympanic throughout No signs of peritonitis  Musculoskeletal:     Cervical back: Normal range of motion and neck supple. No rigidity.     Left hip: Normal. No deformity, lacerations, tenderness, bony tenderness or crepitus. Normal range of motion. Normal strength.     Comments: L arm in sling Mild  bruising noted to ASIS WITHOUT tenderness FABER test negative  Significant ecchymosis/ hematoma to R elbow/ forearm  Lymphadenopathy:     Cervical: No cervical adenopathy.  Neurological:     Mental Status: She is alert.     UC Treatments / Results  Labs (all labs ordered are listed, but only abnormal results are displayed) Labs Reviewed  POCT URINALYSIS DIP (MANUAL ENTRY) - Abnormal; Notable for the following components:      Result Value   Blood, UA trace-intact (*)    Protein Ur, POC trace (*)    Leukocytes, UA Trace (*)    All other components within normal limits  URINE CULTURE    EKG   Radiology DG Abd 2 Views  Result Date: 11/26/2021 CLINICAL DATA:  Fall, landed on her abdomen, lower abdominal pain EXAM: ABDOMEN - 2 VIEW COMPARISON:  CT 01/28/2016 FINDINGS: Nonobstructive bowel gas pattern. Moderate colonic stool burden. Pelvic phleboliths. No other abnormal calcifications. Degenerative changes of the spine. Possible nondisplaced left L3 transverse process fracture. Mild bilateral hip osteoarthritis. IMPRESSION: Possible nondisplaced left L3 transverse process fracture. Nonobstructive bowel gas pattern.  Moderate stool burden. Electronically Signed   By: Maurine Simmering M.D.   On: 11/26/2021 11:55    Procedures Procedures (including critical care time)  Medications Ordered in UC Medications - No data to display  Initial Impression / Assessment and Plan / UC Course  I have reviewed the triage vital signs and the nursing notes.  Pertinent labs & imaging results that were available during my care of the patient were reviewed by me and considered in my medical decision making (see chart for details).     Decreased urination - likely multifactorial. Pt admits  to decreased PO intake, particularly decreased water intake. Denies dysuria. May also be secondary to constipation. Urine culture sent out for confirmation. Try OTC cystex or cranberry caps while awaiting cx  results. Constipation - large colonic stool burden noted. Likely the source of her LLQ discomfort. Pt has decreased physical activity, decreased food and decreased fluid intake, all contributing. May try OTC Miralax daily to improve BM L3 transverse fracture - incidental finding. Pt with no pain in her back, no bruising noted. Discussed findings, pt not wanting to pursue significant workup, will discuss findings with her orthopedist.  Final Clinical Impressions(s) / UC Diagnoses   Final diagnoses:  Frequent UTI  Decreased urination  Constipation, unspecified constipation type  Closed fracture of transverse process of lumbar vertebra, initial encounter [facility])     Discharge Instructions      Your urine does not indicate a UTI We will send a culture out to confirm that it is negative for bacteria, While awaiting the culture, you can try OTC cystex or cranberry caps. Increased water intake would also be helpful. Your xray indicates a moderate stool burden, suggesting constipation. This can also causing changes to urination. Please start taking one scoop of miralax daily (17g).  There is a possible L sided L3 transverse fracture. Please follow up with your orthopedist to further discuss this.     ED Prescriptions   None    PDMP not reviewed this encounter.   Chaney Malling, [place] 11/26/21 1237

## 2021-11-27 ENCOUNTER — Ambulatory Visit: Payer: Medicare HMO

## 2021-11-28 LAB — URINE CULTURE
MICRO NUMBER:: 13489279
Result:: NO GROWTH
SPECIMEN QUALITY:: ADEQUATE

## 2021-12-10 DIAGNOSIS — M25512 Pain in left shoulder: Secondary | ICD-10-CM | POA: Diagnosis not present

## 2021-12-10 DIAGNOSIS — S42202D Unspecified fracture of upper end of left humerus, subsequent encounter for fracture with routine healing: Secondary | ICD-10-CM | POA: Diagnosis not present

## 2021-12-18 ENCOUNTER — Ambulatory Visit (INDEPENDENT_AMBULATORY_CARE_PROVIDER_SITE_OTHER): Payer: Medicare HMO

## 2021-12-18 VITALS — Ht 64.0 in | Wt 101.0 lb

## 2021-12-18 DIAGNOSIS — Z0000 Encounter for general adult medical examination without abnormal findings: Secondary | ICD-10-CM

## 2021-12-18 NOTE — Progress Notes (Signed)
I connected with Paula Jimenez today by telephone and verified that I am speaking with the correct person using two identifiers. Location patient: home Location provider: work Persons participating in the virtual visit: Paula Jimenez, Paula Durand LPN.   I discussed the limitations, risks, security and privacy concerns of performing an evaluation and management service by telephone and the availability of in person appointments. I also discussed with the patient that there may be a patient responsible charge related to this service. The patient expressed understanding and verbally consented to this telephonic visit.    Interactive audio and video telecommunications were attempted between this provider and patient, however failed, due to patient having technical difficulties OR patient did not have access to video capability.  We continued and completed visit with audio only.     Vital signs may be patient reported or missing.  Subjective:   Paula Jimenez is a 88 y.o. female who presents for Medicare Annual (Subsequent) preventive examination.  Review of Systems     Cardiac Risk Factors include: advanced age (>52mn, >>78women);dyslipidemia;hypertension     Objective:    Today's Vitals   12/18/21 1041  Weight: 101 lb (45.8 kg)  Height: '5\' 4"'$  (1.626 m)   Body mass index is 17.34 kg/m.     12/18/2021   10:50 AM 11/26/2020    8:12 AM 08/12/2017    4:27 PM 08/08/2016    9:28 AM 08/07/2015   11:00 AM 05/14/2015   10:08 AM 04/25/2015   11:57 AM  Advanced Directives  Does Patient Have a Medical Advance Directive? Yes Yes Yes Yes Yes Yes No  Type of Advance Directive [place]   Does patient want to make changes to medical advance directive?     No - Patient declined No -  Patient declined   Copy of HBartoloin Chart?  No - copy requested No - copy requested No - copy requested No - copy requested No - copy requested   Would patient like information on creating a medical advance directive?      No - patient declined information Yes - Educational materials given    Current Medications (verified) Outpatient Encounter Medications as of 12/18/2021  Medication Sig   atenolol (TENORMIN) 50 MG tablet TAKE 1 TABLET BY MOUTH EVERY DAY   AYR SALINE NASAL NA Place 1 spray into the nose daily as needed (allergies).    fluorometholone (FML) 0.1 % ophthalmic suspension Place 1 drop into both eyes 2 (two) times daily.   fluticasone (FLONASE) 50 MCG/ACT nasal spray SPRAY 2 SPRAYS INTO EACH NOSTRIL EVERY DAY   hydrocortisone (ANUSOL-HC) 25 MG suppository Place 1 suppository (25 mg total) rectally 2 (two) times daily as needed for hemorrhoids or anal itching.   loperamide (IMODIUM) 2 MG capsule Take 2 mg by mouth as needed for diarrhea or loose stools (has been taking with Clindamycin).   Multiple Vitamin (MULTIVITAMIN) capsule Take 1 capsule by mouth daily.   simvastatin (ZOCOR) 20 MG tablet TAKE 1 TABLET BY MOUTH EVERY DAY   clotrimazole-betamethasone (LOTRISONE) cream Apply 1 application. topically as needed. (Patient not taking: Reported on 12/18/2021)   No facility-administered encounter medications on file as of 12/18/2021.    Allergies (verified) Clindamycin/lincomycin, Codeine, Keflex [cephalexin], Levofloxacin, Rofecoxib, Sulfa antibiotics, Valtrex [valacyclovir hcl], Adhesive [tape], Nitrofurantoin, Penicillins, Raloxifene, and Tramadol  History: Past Medical History:  Diagnosis Date   Allergy    Asthma    years ago, 2-3 times a year pt experiences a problem with breathing.   Bursitis, hip    Family history of adverse reaction to anesthesia    DAUGHTER had a reaction to ansethesia   Hyperlipidemia    Hypertension    HYPERTENSION, BENIGN  10/26/2006   Qualifier: Diagnosis of  By: Selinda Orion     MITRAL VALVE PROLAPSE 02/25/2007   Qualifier: History of  By: Marcelino Scot CMA, [place]     Osteopenia 08/01   Osteoporosis    Skin cancer 2015   nose   Past Surgical History:  Procedure Laterality Date   ABDOMINAL HYSTERECTOMY  1971   BREAST BIOPSY Right 06/99   fibrocystic   CYSTOSCOPY WITH BIOPSY N/A 05/31/2015   Procedure: CYSTOSCOPY WITH BIOPSY/ fulgeration;  Surgeon: Hollice Espy, MD;  Location: [facility] OR[facility];  Service: Urology;  Laterality: N/A;   DENTAL SURGERY     EYE SURGERY Bilateral 2011   June and August of 2011   ORIF RADIAL FRACTURE Left 11/99   arm fracture, radial no surgery   TONSILLECTOMY     Family History  Problem Relation Age of Onset   Leukemia Sister    Cancer Sister        leukemia   Heart disease Mother    Diabetes Mother    Heart failure Mother    Stroke Father    Kidney disease Neg Hx    Kidney cancer Neg Hx    Bladder Cancer Neg Hx    Social History   Socioeconomic History   Marital status: Widowed    Spouse name: Not on file   Number of children: 4   Years of education: Not on file   Highest education level: Not on file  Occupational History   Occupation: retired  Tobacco Use   Smoking status: Never   Smokeless tobacco: Never  Vaping Use   Vaping Use: Never used  Substance and Sexual Activity   Alcohol use: No    Alcohol/week: 0.0 standard drinks of alcohol   Drug use: No   Sexual activity: Not Currently  Other Topics Concern   Not on file  Social History Narrative   caring for daughter with leg injury   caring for sister with leukemia          Social Determinants of Health   Financial Resource Strain: Low Risk  (12/18/2021)   Overall Financial Resource Strain (CARDIA)    Difficulty of Paying Living Expenses: Not hard at all  Food Insecurity: No Food Insecurity (12/18/2021)   Hunger Vital Sign    Worried About Running Out of Food in the Last Year: Never true    [place] in the Last Year: Never true  Transportation Needs: No Transportation Needs (12/18/2021)   PRAPARE - Hydrologist (Medical): No    Lack of Transportation (Non-Medical): No  Physical Activity: Inactive (12/18/2021)   Exercise Vital Sign    Days of Exercise per Week: 0 days    Minutes of Exercise per Session: 0 min  Stress: No Stress Concern Present (12/18/2021)   [place]    Feeling of Stress : Not at all  Social Connections: Not on file    Tobacco Counseling Counseling given: Not Answered   Clinical Intake:  Pre-visit preparation completed: Yes  Pain : No/denies pain  Nutritional Status: BMI <19  Underweight Nutritional Risks: None Diabetes: No  How often do you need to have someone help you when you read instructions, pamphlets, or other written materials from your doctor or pharmacy?: 1 - Never  Diabetic? no  Interpreter Needed?: No  Information entered by :: NAllen LPN   Activities of Daily Living    12/18/2021   10:52 AM  In your present state of health, do you have any difficulty performing the following activities:  Hearing? 0  Comment sometimes with excess wax in ears  Vision? 0  Difficulty concentrating or making decisions? 0  Walking or climbing stairs? 1  Dressing or bathing? 1  Doing errands, shopping? 1  Preparing Food and eating ? N  Comment since broke arm  Using the Toilet? N  In the past six months, have you accidently leaked urine? N  Do you have problems with loss of bowel control? N  Managing your Medications? N  Managing your Finances? N  Housekeeping or managing your Housekeeping? N    Patient Care Team: Tower, Wynelle Fanny, MD as PCP - Valinda Party, MD as Referring Physician (Ophthalmology)  Indicate any recent Medical Services you may have received from other than [facility] providers in the past year (date may be  approximate).     Assessment:   This is a routine wellness examination for Paula Jimenez.  Hearing/Vision screen Vision Screening - Comments:: Regular eye exams,   Dietary issues and exercise activities discussed: Current Exercise Habits: The patient does not participate in regular exercise at present   Goals Addressed             This Visit's Progress    Patient Stated       12/18/2021, no goals       Depression Screen    12/18/2021   10:52 AM 11/26/2020    8:16 AM 10/21/2019   10:14 AM 08/25/2018   10:22 AM 08/12/2017    4:30 PM 08/08/2016    9:27 AM 08/07/2015   11:02 AM  PHQ 2/9 Scores  PHQ - 2 Score 0 0 0 0 0 0 0  PHQ- 9 Score  0   3      Fall Risk    12/18/2021   10:50 AM 11/26/2020    8:15 AM 10/21/2019   10:14 AM 08/25/2018   10:23 AM 08/12/2017    4:30 PM  Fall Risk   Falls in the past year? '1 1 1 '$ 0 No  Comment tripped on a rug at church      Number falls in past yr: 0 0 1    Injury with Fall? 1 0 0    Comment broke arm      Risk for fall due to : Medication side effect;History of fall(s) Medication side effect     Follow up Falls evaluation completed;Education provided;Falls prevention discussed Falls evaluation completed;Falls prevention discussed       FALL RISK PREVENTION PERTAINING TO THE HOME:  Any stairs in or around the home? Yes  If so, are there any without handrails? No  Home free of loose throw rugs in walkways, pet beds, electrical cords, etc? Yes  Adequate lighting in your home to reduce risk of falls? Yes   ASSISTIVE DEVICES UTILIZED TO PREVENT FALLS:  Life alert? No  Use of a cane, walker or w/c? Yes  Grab bars in the bathroom? Yes  Shower chair or bench in shower? Yes  Elevated toilet seat or a handicapped  toilet? Yes   TIMED UP AND GO:  Was the test performed? No .      Cognitive Function:    11/26/2020    8:20 AM 08/12/2017    4:30 PM 08/08/2016    9:28 AM 08/07/2015   11:04 AM  MMSE - Mini Mental State Exam  Not completed: Unable  to complete     Orientation to time  '5 5 5  '$ Orientation to Place  '5 5 5  '$ Registration  '3 3 3  '$ Attention/ Calculation  0 0 5  Recall  '3 3 3  '$ Language- name 2 objects  0 0   Language- repeat  '1 1 1  '$ Language- follow 3 step command  '3 3 3  '$ Language- read & follow direction  0 0 1  Write a sentence  0 0   Copy design  0 0 1  Total score  20 20         12/18/2021   10:57 AM  6CIT Screen  What Year? 0 points  What month? 0 points  What time? 0 points  Count back from 20 2 points  Months in reverse 4 points  Repeat phrase 4 points  Total Score 10 points    Immunizations Immunization History  Administered Date(s) Administered   Fluad Quad(high Dose 65+) 03/02/2019   Influenza, High Dose Seasonal PF 05/12/2017, 04/20/2018   Influenza,inj,Quad PF,6+ Mos 06/22/2013, 06/26/2014, 04/25/2015, 04/21/2016   Pneumococcal Conjugate-13 06/26/2014   Pneumococcal Polysaccharide-23 11/19/2011   Td 04/24/1998, 11/10/2008   Zoster, Live 08/20/2016    TDAP status: Up to date  Flu Vaccine status: Up to date  Pneumococcal vaccine status: Up to date  Covid-19 vaccine status: Declined, Education has been provided regarding the importance of this vaccine but patient still declined. Advised may receive this vaccine at local pharmacy or Health Dept.or vaccine clinic. Aware to provide a copy of the vaccination record if obtained from local pharmacy or Health Dept. Verbalized acceptance and understanding.  Qualifies for Shingles Vaccine? Yes   Zostavax completed Yes   Shingrix Completed?: No.    Education has been provided regarding the importance of this vaccine. Patient has been advised to call insurance company to determine out of pocket expense if they have not yet received this vaccine. Advised may also receive vaccine at local pharmacy or Health Dept. Verbalized acceptance and understanding.  Screening Tests Health Maintenance  Topic Date Due   COVID-19 Vaccine (1) Never done   Zoster  Vaccines- Shingrix (1 of 2) Never done   INFLUENZA VACCINE  01/21/2022   TETANUS/TDAP  11/25/2031   Pneumonia Vaccine 16+ Years old  Completed   DEXA SCAN  Completed   HPV VACCINES  Aged Out    Health Maintenance  Health Maintenance Due  Topic Date Due   COVID-19 Vaccine (1) Never done   Zoster Vaccines- Shingrix (1 of 2) Never done    Colorectal cancer screening: No longer required.   Mammogram status: No longer required due to age.  Bone Density status: Completed 12/20/2011.   Lung Cancer Screening: (Low Dose CT Chest recommended if Age 71-80 years, 30 pack-year currently smoking OR have quit w/in 15years.) does not qualify.   Lung Cancer Screening Referral: no  Additional Screening:  Hepatitis C Screening: does not qualify;   Vision Screening: Recommended annual ophthalmology exams for early detection of glaucoma and other disorders of the eye. Is the patient up to date with their annual eye exam?  Yes  Who is  the provider or what is the name of the office in which the patient attends annual eye exams? Does not remember If pt is not established with a provider, would they like to be referred to a provider to establish care? No .   Dental Screening: Recommended annual dental exams for proper oral hygiene  Community Resource Referral / Chronic Care Management: CRR required this visit?  No   CCM required this visit?  No      Plan:     I have personally reviewed and noted the following in the patient's chart:   Medical and social history Use of alcohol, tobacco or illicit drugs  Current medications and supplements including opioid prescriptions.  Functional ability and status Nutritional status Physical activity Advanced directives List of other physicians Hospitalizations, surgeries, and ER visits in previous 12 months Vitals Screenings to include cognitive, depression, and falls Referrals and appointments  In addition, I have reviewed and discussed with  patient certain preventive protocols, quality metrics, and best practice recommendations. A written personalized care plan for preventive services as well as general preventive health recommendations were provided to patient.     Kellie Simmering, LPN   [id number]   Nurse Notes: none  Due to this being a virtual visit, the after visit summary with patients personalized plan was offered to patient via mail or my-chart.  Patient would like to access on my-chart

## 2021-12-18 NOTE — Patient Instructions (Signed)
Ms. Tauer , Thank you for taking time to come for your Medicare Wellness Visit. I appreciate your ongoing commitment to your health goals. Please review the following plan we discussed and let me know if I can assist you in the future.   Screening recommendations/referrals: Colonoscopy: not required Mammogram: not required Bone Density: completed 12/20/2011 Recommended yearly ophthalmology/optometry visit for glaucoma screening and checkup Recommended yearly dental visit for hygiene and checkup  Vaccinations: Influenza vaccine: due 01/21/2022 Pneumococcal vaccine: completed 06/26/2014 Tdap vaccine: completed 11/24/2021, due 11/25/2031 Shingles vaccine: discussed   Covid-19: decline  Advanced directives: Please bring a copy of your POA (Power of Attorney) and/or Living Will to your next appointment.   Conditions/risks identified: none  Next appointment: Follow up in one year for your annual wellness visit    Preventive Care 65 Years and Older, Female Preventive care refers to lifestyle choices and visits with your health care provider that can promote health and wellness. What does preventive care include? A yearly physical exam. This is also called an annual well check. Dental exams once or twice a year. Routine eye exams. Ask your health care provider how often you should have your eyes checked. Personal lifestyle choices, including: Daily care of your teeth and gums. Regular physical activity. Eating a healthy diet. Avoiding tobacco and drug use. Limiting alcohol use. Practicing safe sex. Taking low-dose aspirin every day. Taking vitamin and mineral supplements as recommended by your health care provider. What happens during an annual well check? The services and screenings done by your health care provider during your annual well check will depend on your age, overall health, lifestyle risk factors, and family history of disease. Counseling  Your health care provider may ask you  questions about your: Alcohol use. Tobacco use. Drug use. Emotional well-being. Home and relationship well-being. Sexual activity. Eating habits. History of falls. Memory and ability to understand (cognition). Work and work Statistician. Reproductive health. Screening  You may have the following tests or measurements: Height, weight, and BMI. Blood pressure. Lipid and cholesterol levels. These may be checked every 5 years, or more frequently if you are over 4 years old. Skin check. Lung cancer screening. You may have this screening every year starting at age 32 if you have a 30-pack-year history of smoking and currently smoke or have quit within the past 15 years. Fecal occult blood test (FOBT) of the stool. You may have this test every year starting at age 93. Flexible sigmoidoscopy or colonoscopy. You may have a sigmoidoscopy every 5 years or a colonoscopy every 10 years starting at age 32. Hepatitis C blood test. Hepatitis B blood test. Sexually transmitted disease (STD) testing. Diabetes screening. This is done by checking your blood sugar (glucose) after you have not eaten for a while (fasting). You may have this done every 1-3 years. Bone density scan. This is done to screen for osteoporosis. You may have this done starting at age 31. Mammogram. This may be done every 1-2 years. Talk to your health care provider about how often you should have regular mammograms. Talk with your health care provider about your test results, treatment options, and if necessary, the need for more tests. Vaccines  Your health care provider may recommend certain vaccines, such as: Influenza vaccine. This is recommended every year. Tetanus, diphtheria, and acellular pertussis (Tdap, Td) vaccine. You may need a Td booster every 10 years. Zoster vaccine. You may need this after age 79. Pneumococcal 13-valent conjugate (PCV13) vaccine. One dose is recommended  after age 52. Pneumococcal polysaccharide  (PPSV23) vaccine. One dose is recommended after age 35. Talk to your health care provider about which screenings and vaccines you need and how often you need them. This information is not intended to replace advice given to you by your health care provider. Make sure you discuss any questions you have with your health care provider. Document Released: 07/06/2015 Document Revised: 02/27/2016 Document Reviewed: 04/10/2015 Elsevier Interactive Patient Education  2017 [employer] Prevention in the Home Falls can cause injuries. They can happen to people of all ages. There are many things you can do to make your home safe and to help prevent falls. What can I do on the outside of my home? Regularly fix the edges of walkways and driveways and fix any cracks. Remove anything that might make you trip as you walk through a door, such as a raised step or threshold. Trim any bushes or trees on the path to your home. Use bright outdoor lighting. Clear any walking paths of anything that might make someone trip, such as rocks or tools. Regularly check to see if handrails are loose or broken. Make sure that both sides of any steps have handrails. Any raised decks and porches should have guardrails on the edges. Have any leaves, snow, or ice cleared regularly. Use sand or salt on walking paths during winter. Clean up any spills in your garage right away. This includes oil or grease spills. What can I do in the bathroom? Use night lights. Install grab bars by the toilet and in the tub and shower. Do not use towel bars as grab bars. Use non-skid mats or decals in the tub or shower. If you need to sit down in the shower, use a plastic, non-slip stool. Keep the floor dry. Clean up any water that spills on the floor as soon as it happens. Remove soap buildup in the tub or shower regularly. Attach bath mats securely with double-sided non-slip rug tape. Do not have throw rugs and other things on the  floor that can make you trip. What can I do in the bedroom? Use night lights. Make sure that you have a light by your bed that is easy to reach. Do not use any sheets or blankets that are too big for your bed. They should not hang down onto the floor. Have a firm chair that has side arms. You can use this for support while you get dressed. Do not have throw rugs and other things on the floor that can make you trip. What can I do in the kitchen? Clean up any spills right away. Avoid walking on wet floors. Keep items that you use a lot in easy-to-reach places. If you need to reach something above you, use a strong step stool that has a grab bar. Keep electrical cords out of the way. Do not use floor polish or wax that makes floors slippery. If you must use wax, use non-skid floor wax. Do not have throw rugs and other things on the floor that can make you trip. What can I do with my stairs? Do not leave any items on the stairs. Make sure that there are handrails on both sides of the stairs and use them. Fix handrails that are broken or loose. Make sure that handrails are as long as the stairways. Check any carpeting to make sure that it is firmly attached to the stairs. Fix any carpet that is loose or worn. Avoid having throw  rugs at the top or bottom of the stairs. If you do have throw rugs, attach them to the floor with carpet tape. Make sure that you have a light switch at the top of the stairs and the bottom of the stairs. If you do not have them, ask someone to add them for you. What else can I do to help prevent falls? Wear shoes that: Do not have high heels. Have rubber bottoms. Are comfortable and fit you well. Are closed at the toe. Do not wear sandals. If you use a stepladder: Make sure that it is fully opened. Do not climb a closed stepladder. Make sure that both sides of the stepladder are locked into place. Ask someone to hold it for you, if possible. Clearly mark and make  sure that you can see: Any grab bars or handrails. First and last steps. Where the edge of each step is. Use tools that help you move around (mobility aids) if they are needed. These include: Canes. Walkers. Scooters. Crutches. Turn on the lights when you go into a dark area. Replace any light bulbs as soon as they burn out. Set up your furniture so you have a clear path. Avoid moving your furniture around. If any of your floors are uneven, fix them. If there are any pets around you, be aware of where they are. Review your medicines with your doctor. Some medicines can make you feel dizzy. This can increase your chance of falling. Ask your doctor what other things that you can do to help prevent falls. This information is not intended to replace advice given to you by your health care provider. Make sure you discuss any questions you have with your health care provider. Document Released: 04/05/2009 Document Revised: 11/15/2015 Document Reviewed: 07/14/2014 Elsevier Interactive Patient Education  2017 [employer].

## 2022-01-01 DIAGNOSIS — S42202D Unspecified fracture of upper end of left humerus, subsequent encounter for fracture with routine healing: Secondary | ICD-10-CM | POA: Diagnosis not present

## 2022-01-20 DIAGNOSIS — S42202D Unspecified fracture of upper end of left humerus, subsequent encounter for fracture with routine healing: Secondary | ICD-10-CM | POA: Diagnosis not present

## 2022-01-20 DIAGNOSIS — M6281 Muscle weakness (generalized): Secondary | ICD-10-CM | POA: Diagnosis not present

## 2022-01-20 DIAGNOSIS — M25512 Pain in left shoulder: Secondary | ICD-10-CM | POA: Diagnosis not present

## 2022-01-27 DIAGNOSIS — M25512 Pain in left shoulder: Secondary | ICD-10-CM | POA: Diagnosis not present

## 2022-01-27 DIAGNOSIS — S42202D Unspecified fracture of upper end of left humerus, subsequent encounter for fracture with routine healing: Secondary | ICD-10-CM | POA: Diagnosis not present

## 2022-01-27 DIAGNOSIS — M6281 Muscle weakness (generalized): Secondary | ICD-10-CM | POA: Diagnosis not present

## 2022-02-04 DIAGNOSIS — S42202D Unspecified fracture of upper end of left humerus, subsequent encounter for fracture with routine healing: Secondary | ICD-10-CM | POA: Diagnosis not present

## 2022-02-04 DIAGNOSIS — M25512 Pain in left shoulder: Secondary | ICD-10-CM | POA: Diagnosis not present

## 2022-02-04 DIAGNOSIS — M6281 Muscle weakness (generalized): Secondary | ICD-10-CM | POA: Diagnosis not present

## 2022-02-06 DIAGNOSIS — M6281 Muscle weakness (generalized): Secondary | ICD-10-CM | POA: Diagnosis not present

## 2022-02-06 DIAGNOSIS — M25512 Pain in left shoulder: Secondary | ICD-10-CM | POA: Diagnosis not present

## 2022-02-06 DIAGNOSIS — S42202D Unspecified fracture of upper end of left humerus, subsequent encounter for fracture with routine healing: Secondary | ICD-10-CM | POA: Diagnosis not present

## 2022-02-11 DIAGNOSIS — M25512 Pain in left shoulder: Secondary | ICD-10-CM | POA: Diagnosis not present

## 2022-02-11 DIAGNOSIS — S42202D Unspecified fracture of upper end of left humerus, subsequent encounter for fracture with routine healing: Secondary | ICD-10-CM | POA: Diagnosis not present

## 2022-02-11 DIAGNOSIS — M6281 Muscle weakness (generalized): Secondary | ICD-10-CM | POA: Diagnosis not present

## 2022-02-12 DIAGNOSIS — S42202D Unspecified fracture of upper end of left humerus, subsequent encounter for fracture with routine healing: Secondary | ICD-10-CM | POA: Diagnosis not present

## 2022-02-13 DIAGNOSIS — S42202D Unspecified fracture of upper end of left humerus, subsequent encounter for fracture with routine healing: Secondary | ICD-10-CM | POA: Diagnosis not present

## 2022-02-13 DIAGNOSIS — M6281 Muscle weakness (generalized): Secondary | ICD-10-CM | POA: Diagnosis not present

## 2022-02-13 DIAGNOSIS — M25512 Pain in left shoulder: Secondary | ICD-10-CM | POA: Diagnosis not present

## 2022-02-18 DIAGNOSIS — S42202D Unspecified fracture of upper end of left humerus, subsequent encounter for fracture with routine healing: Secondary | ICD-10-CM | POA: Diagnosis not present

## 2022-02-18 DIAGNOSIS — M25512 Pain in left shoulder: Secondary | ICD-10-CM | POA: Diagnosis not present

## 2022-02-18 DIAGNOSIS — M6281 Muscle weakness (generalized): Secondary | ICD-10-CM | POA: Diagnosis not present

## 2022-02-20 DIAGNOSIS — S42202D Unspecified fracture of upper end of left humerus, subsequent encounter for fracture with routine healing: Secondary | ICD-10-CM | POA: Diagnosis not present

## 2022-02-20 DIAGNOSIS — M6281 Muscle weakness (generalized): Secondary | ICD-10-CM | POA: Diagnosis not present

## 2022-02-20 DIAGNOSIS — M25512 Pain in left shoulder: Secondary | ICD-10-CM | POA: Diagnosis not present

## 2022-02-25 DIAGNOSIS — S42202D Unspecified fracture of upper end of left humerus, subsequent encounter for fracture with routine healing: Secondary | ICD-10-CM | POA: Diagnosis not present

## 2022-02-25 DIAGNOSIS — M6281 Muscle weakness (generalized): Secondary | ICD-10-CM | POA: Diagnosis not present

## 2022-02-25 DIAGNOSIS — M25512 Pain in left shoulder: Secondary | ICD-10-CM | POA: Diagnosis not present

## 2022-02-27 DIAGNOSIS — M25512 Pain in left shoulder: Secondary | ICD-10-CM | POA: Diagnosis not present

## 2022-02-27 DIAGNOSIS — S42202D Unspecified fracture of upper end of left humerus, subsequent encounter for fracture with routine healing: Secondary | ICD-10-CM | POA: Diagnosis not present

## 2022-02-27 DIAGNOSIS — M6281 Muscle weakness (generalized): Secondary | ICD-10-CM | POA: Diagnosis not present

## 2022-02-28 ENCOUNTER — Other Ambulatory Visit: Payer: Self-pay | Admitting: Family Medicine

## 2022-02-28 NOTE — Telephone Encounter (Signed)
Please schedule annual exam and refil until then

## 2022-02-28 NOTE — Telephone Encounter (Signed)
Pt has had multiple acute appts but last CPE was 2021 and no future appts., please advise

## 2022-02-28 NOTE — Telephone Encounter (Signed)
Meds filled for 3 months will eval pt at that time for appt

## 2022-02-28 NOTE — Telephone Encounter (Signed)
Spoke with patient and she stated she fell on June 4th. She is currently in physical therapy and getting help from her daughter right now in [place]. She stated she will call when therapy is done, but isn't sure when it will be. At the moment she isn't able to drive and have no way to get here. She was wanting to know if her medication could be refilled at this time. Thank you!

## 2022-02-28 NOTE — Telephone Encounter (Signed)
Not more than 3 months, she is way overdue for labs - refill is ok for 3 months   She may end up wanting to see someone closer if she needs to be out that way (I totally understand)  Hope she is doing better!

## 2022-02-28 NOTE — Telephone Encounter (Signed)
See prev message, please schedule appt (labs prior if possible) and then route back to me. Thanks

## 2022-03-04 DIAGNOSIS — M6281 Muscle weakness (generalized): Secondary | ICD-10-CM | POA: Diagnosis not present

## 2022-03-04 DIAGNOSIS — S42202D Unspecified fracture of upper end of left humerus, subsequent encounter for fracture with routine healing: Secondary | ICD-10-CM | POA: Diagnosis not present

## 2022-03-04 DIAGNOSIS — M25512 Pain in left shoulder: Secondary | ICD-10-CM | POA: Diagnosis not present

## 2022-03-06 DIAGNOSIS — M6281 Muscle weakness (generalized): Secondary | ICD-10-CM | POA: Diagnosis not present

## 2022-03-06 DIAGNOSIS — S42202D Unspecified fracture of upper end of left humerus, subsequent encounter for fracture with routine healing: Secondary | ICD-10-CM | POA: Diagnosis not present

## 2022-03-06 DIAGNOSIS — M25512 Pain in left shoulder: Secondary | ICD-10-CM | POA: Diagnosis not present

## 2022-03-10 DIAGNOSIS — M6281 Muscle weakness (generalized): Secondary | ICD-10-CM | POA: Diagnosis not present

## 2022-03-10 DIAGNOSIS — M25512 Pain in left shoulder: Secondary | ICD-10-CM | POA: Diagnosis not present

## 2022-03-10 DIAGNOSIS — S42202D Unspecified fracture of upper end of left humerus, subsequent encounter for fracture with routine healing: Secondary | ICD-10-CM | POA: Diagnosis not present

## 2022-03-13 ENCOUNTER — Ambulatory Visit: Payer: Medicare HMO | Admitting: Podiatry

## 2022-03-13 DIAGNOSIS — S42202D Unspecified fracture of upper end of left humerus, subsequent encounter for fracture with routine healing: Secondary | ICD-10-CM | POA: Diagnosis not present

## 2022-03-13 DIAGNOSIS — M6281 Muscle weakness (generalized): Secondary | ICD-10-CM | POA: Diagnosis not present

## 2022-03-13 DIAGNOSIS — M25512 Pain in left shoulder: Secondary | ICD-10-CM | POA: Diagnosis not present

## 2022-03-25 ENCOUNTER — Telehealth: Payer: Self-pay | Admitting: Family Medicine

## 2022-03-25 DIAGNOSIS — R7309 Other abnormal glucose: Secondary | ICD-10-CM

## 2022-03-25 DIAGNOSIS — I10 Essential (primary) hypertension: Secondary | ICD-10-CM

## 2022-03-25 DIAGNOSIS — E7800 Pure hypercholesterolemia, unspecified: Secondary | ICD-10-CM

## 2022-03-25 NOTE — Telephone Encounter (Signed)
-----   Message from Ellamae Sia sent at 03/25/2022 12:50 PM EDT ----- Regarding: Lab orders for Wednesday, 10.4.23 Lab orders, no f/u appt

## 2022-03-26 ENCOUNTER — Other Ambulatory Visit (INDEPENDENT_AMBULATORY_CARE_PROVIDER_SITE_OTHER): Payer: Medicare HMO

## 2022-03-26 DIAGNOSIS — E7800 Pure hypercholesterolemia, unspecified: Secondary | ICD-10-CM | POA: Diagnosis not present

## 2022-03-26 DIAGNOSIS — R7309 Other abnormal glucose: Secondary | ICD-10-CM

## 2022-03-26 DIAGNOSIS — I10 Essential (primary) hypertension: Secondary | ICD-10-CM

## 2022-03-26 LAB — LIPID PANEL
Cholesterol: 157 mg/dL (ref 0–200)
HDL: 56.2 mg/dL (ref >39.00)
LDL Cholesterol: 81 mg/dL (ref 0–99)
NonHDL: 101.21
Total CHOL/HDL Ratio: 3
Triglycerides: 101 mg/dL (ref 0.0–149.0)
VLDL: 20.2 mg/dL (ref 0.0–40.0)

## 2022-03-26 LAB — COMPREHENSIVE METABOLIC PANEL
ALT: 17 U/L (ref 0–35)
AST: 19 U/L (ref 0–37)
Albumin: 3.9 g/dL (ref 3.5–5.2)
Alkaline Phosphatase: 66 U/L (ref 39–117)
BUN: 10 mg/dL (ref 6–23)
CO2: 28 mEq/L (ref 19–32)
Calcium: 8.9 mg/dL (ref 8.4–10.5)
Chloride: 97 mEq/L (ref 96–112)
Creatinine, Ser: 0.63 mg/dL (ref 0.40–1.20)
GFR: 76.48 mL/min (ref >60.00)
Glucose, Bld: 139 mg/dL — ABNORMAL HIGH (ref 70–99)
Potassium: 3.9 mEq/L (ref 3.5–5.1)
Sodium: 135 mEq/L (ref 135–145)
Total Bilirubin: 0.6 mg/dL (ref 0.2–1.2)
Total Protein: 6.1 g/dL (ref 6.0–8.3)

## 2022-03-26 LAB — CBC WITH DIFFERENTIAL/PLATELET
Basophils Absolute: 0 10*3/uL (ref 0.0–0.1)
Basophils Relative: 0.4 % (ref 0.0–3.0)
Eosinophils Absolute: 0.1 10*3/uL (ref 0.0–0.7)
Eosinophils Relative: 1.2 % (ref 0.0–5.0)
HCT: 41.6 % (ref 36.0–46.0)
Hemoglobin: 13.9 g/dL (ref 12.0–15.0)
Lymphocytes Relative: 19.8 % (ref 12.0–46.0)
Lymphs Abs: 1.1 10*3/uL (ref 0.7–4.0)
MCHC: 33.4 g/dL (ref 30.0–36.0)
MCV: 91.4 fl (ref 78.0–100.0)
Monocytes Absolute: 0.4 10*3/uL (ref 0.1–1.0)
Monocytes Relative: 7.2 % (ref 3.0–12.0)
Neutro Abs: 3.8 10*3/uL (ref 1.4–7.7)
Neutrophils Relative %: 71.4 % (ref 43.0–77.0)
Platelets: 181 10*3/uL (ref 150.0–400.0)
RBC: 4.55 Mil/uL (ref 3.87–5.11)
RDW: 13.6 % (ref 11.5–15.5)
WBC: 5.4 10*3/uL (ref 4.0–10.5)

## 2022-03-26 LAB — HEMOGLOBIN A1C: Hgb A1c MFr Bld: 6.1 % (ref 4.6–6.5)

## 2022-03-26 LAB — TSH: TSH: 1.23 u[IU]/mL (ref 0.35–5.50)

## 2022-04-01 DIAGNOSIS — Z85828 Personal history of other malignant neoplasm of skin: Secondary | ICD-10-CM | POA: Diagnosis not present

## 2022-04-01 DIAGNOSIS — D485 Neoplasm of uncertain behavior of skin: Secondary | ICD-10-CM | POA: Diagnosis not present

## 2022-04-01 DIAGNOSIS — C44329 Squamous cell carcinoma of skin of other parts of face: Secondary | ICD-10-CM | POA: Diagnosis not present

## 2022-04-01 DIAGNOSIS — L821 Other seborrheic keratosis: Secondary | ICD-10-CM | POA: Diagnosis not present

## 2022-04-07 ENCOUNTER — Encounter: Payer: Self-pay | Admitting: Family Medicine

## 2022-04-07 ENCOUNTER — Ambulatory Visit (INDEPENDENT_AMBULATORY_CARE_PROVIDER_SITE_OTHER): Payer: Medicare HMO | Admitting: Family Medicine

## 2022-04-07 VITALS — BP 136/78 | HR 89 | Temp 97.5°F | Ht 64.0 in | Wt 98.4 lb

## 2022-04-07 DIAGNOSIS — J301 Allergic rhinitis due to pollen: Secondary | ICD-10-CM | POA: Diagnosis not present

## 2022-04-07 DIAGNOSIS — W19XXXA Unspecified fall, initial encounter: Secondary | ICD-10-CM | POA: Diagnosis not present

## 2022-04-07 DIAGNOSIS — Y92009 Unspecified place in unspecified non-institutional (private) residence as the place of occurrence of the external cause: Secondary | ICD-10-CM | POA: Insufficient documentation

## 2022-04-07 DIAGNOSIS — R531 Weakness: Secondary | ICD-10-CM

## 2022-04-07 DIAGNOSIS — N390 Urinary tract infection, site not specified: Secondary | ICD-10-CM

## 2022-04-07 DIAGNOSIS — R42 Dizziness and giddiness: Secondary | ICD-10-CM

## 2022-04-07 DIAGNOSIS — R296 Repeated falls: Secondary | ICD-10-CM | POA: Diagnosis not present

## 2022-04-07 LAB — POC URINALSYSI DIPSTICK (AUTOMATED)
Bilirubin, UA: 1
Blood, UA: 25
Glucose, UA: NEGATIVE
Ketones, UA: NEGATIVE
Nitrite, UA: NEGATIVE
Protein, UA: POSITIVE — AB
Spec Grav, UA: 1.025 (ref 1.010–1.025)
Urobilinogen, UA: 0.2 E.U./dL
pH, UA: 6 (ref 5.0–8.0)

## 2022-04-07 NOTE — Assessment & Plan Note (Signed)
Abrasions on R lip and L cheek -not infected Tetanus utd No head trauma but cannot rule out mild concussion   Will watch closely

## 2022-04-07 NOTE — Patient Instructions (Addendum)
If your headache and mental fuzziness/ dizziness do not continue to improve please let us know  We would want to consider a CT scan   Keep the abrasions clean with soap and water   I sent your urine sample for a culture  Drink fluids , urine is concentrated  We will contact you when that returns and if you have a uti we will treat it   In the future you may benefit from PT to prevent falls  Let us know if you are interested   Eat protein with every meal I want you to gain some muscle and get stronger   I don't think you have a sinus infection

## 2022-04-07 NOTE — Assessment & Plan Note (Signed)
With adv age and muscle loss No doubt would benefit from PT for gait tx She declines for now  Given handout  Disc imp of protein intake

## 2022-04-07 NOTE — Assessment & Plan Note (Signed)
More runny nose lately  No sinus pain or cough or fever  Has zyrtec to use prn Does like being outside

## 2022-04-07 NOTE — Assessment & Plan Note (Signed)
ua today is concentrated, disc inc water intake sm blood and wbc Culture pending currently  No symptoms but had a recent fall Will watch closely  She has urology appt later this mo as well  Has done well recently in terms of uti

## 2022-04-07 NOTE — Assessment & Plan Note (Signed)
Had a fall sat -hit her lip area but not her head  Some mild headache/temples and mental fuzziness  This is improving by the day Reassuring exam  May be mild concussion from abrupt movement of head  Will watch closely  Disc s/s of concussion in detail  If symptoms fail to improve more or if worse will order CT head

## 2022-04-07 NOTE — Progress Notes (Signed)
Subjective:    Patient ID: Paula Jimenez, female    DOB: [birth year], 88 y.o.   MRN: [id number]  HPI Pt presents for symptoms of sinusitis , with pressure in head and dizziness   Wt Readings from Last 3 Encounters:  04/07/22 98 lb 6 oz (44.6 kg)  12/18/21 101 lb (45.8 kg)  11/26/21 104 lb (47.2 kg)   16.89 kg/m  Had fall in June at church and broke L arm Finished therapy   Had a fall and has gone down hill since (fell Saturday tripped and fell on couch)- was in process of changing furniture  Teeth cut her lip  Did not bump her head  Did end up going down on the floor/wood  Has abrasions and some soreness  Not eating much, her wt is down  Eating some soup now    Sinus symptoms ?   (Last sinus infection was in January) - noted at that time she had allergies also , was px some cough pills and zyrtec  Light headed  Not spinning  No change with change in position  Pressure feeling Runny nose  Clear nasal discharge  No congestion  Had a little cough since January   Some headache-tylenol helps  This is in temples, both sides   Occ gets a headache anyway   Tdap [place] up to date  [facility] with cane (4 prong)   Also weak Light headed  No appetite  Wanted to check a ua  Has h/o freq uti in the past   Ua today with sm rbc, wbc and protein  Urine is clear No dysuria   Results for orders placed or performed in visit on 04/07/22  POCT Urinalysis Dipstick (Automated)  Result Value Ref Range   Color, UA Dark Yellow    Clarity, UA Hazy    Glucose, UA Negative Negative   Bilirubin, UA 1 mg/dL    Ketones, UA Negative    Spec Grav, UA 1.025 1.010 - 1.025   Blood, UA 25 Ery/uL    pH, UA 6.0 5.0 - 8.0   Protein, UA Positive (A) Negative   Urobilinogen, UA 0.2 0.2 or 1.0 E.U./dL   Nitrite, UA Negative    Leukocytes, UA Small (1+) (A) Negative   Urine is concentrated Needs to drink moe water   Pulse ox is 98% today on RA  BP Readings from Last 3 Encounters:  04/07/22  136/78  11/26/21 (!) 163/90  09/30/21 (!) 142/74   Pulse Readings from Last 3 Encounters:  04/07/22 89  11/26/21 77  09/30/21 79    Patient Active Problem List   Diagnosis Date Noted   Falls frequently 04/07/2022   Fall at home 04/07/2022   Insect bite of back 09/30/2021   Varicose vein of leg 10/18/2020   Vaginal discharge 04/19/2020   Pelvic pressure in female 04/19/2020   Fatigue 05/30/2019   Onychomycosis of toenail 03/02/2019   Urethral caruncle 12/10/2018   Acute cystitis 11/29/2018   Elevated random blood glucose level 08/25/2018   Medicare annual wellness visit, subsequent 08/25/2018   Loose stools 06/28/2018   Internal hemorrhoid, bleeding 04/20/2018   Corn of toe 04/20/2018   Ingrown nail of great toe of right foot 11/05/2017   Frequent UTI 02/16/2015   Epistaxis, recurrent 02/28/2014   History of shingles 02/20/2014   Dizziness 01/06/2014   Hearing loss 09/23/2007   History of melanoma 02/25/2007   Hyperlipidemia 02/25/2007   MITRAL VALVE PROLAPSE 02/25/2007   Allergic rhinitis  02/25/2007   DEGENERATIVE DISC DISEASE, LUMBOSACRAL SPINE 02/25/2007   Age related osteoporosis 02/25/2007   HYPERTENSION, BENIGN 10/26/2006   Past Medical History:  Diagnosis Date   Allergy    Asthma    years ago, 2-3 times a year pt experiences a problem with breathing.   Bursitis, hip    Family history of adverse reaction to anesthesia    DAUGHTER had a reaction to ansethesia   Hyperlipidemia    Hypertension    HYPERTENSION, BENIGN 10/26/2006   Qualifier: Diagnosis of  By: Selinda Orion     MITRAL VALVE PROLAPSE 02/25/2007   Qualifier: History of  By: Marcelino Scot CMA, [place] Bilberry     Osteopenia 08/01   Osteoporosis    Skin cancer 2015   nose   Past Surgical History:  Procedure Laterality Date   ABDOMINAL HYSTERECTOMY  1971   BREAST BIOPSY Right 06/99   fibrocystic   CYSTOSCOPY WITH BIOPSY N/A 05/31/2015   Procedure: CYSTOSCOPY WITH BIOPSY/ fulgeration;  Surgeon: Hollice Espy, MD;  Location: [facility] ORS;  Service: Urology;  Laterality: N/A;   DENTAL SURGERY     EYE SURGERY Bilateral 2011   June and August of 2011   ORIF RADIAL FRACTURE Left 11/99   arm fracture, radial no surgery   TONSILLECTOMY     Social History   Tobacco Use   Smoking status: Never   Smokeless tobacco: Never  Vaping Use   Vaping Use: Never used  Substance Use Topics   Alcohol use: No    Alcohol/week: 0.0 standard drinks of alcohol   Drug use: No   Family History  Problem Relation Age of Onset   Leukemia Sister    Cancer Sister        leukemia   Heart disease Mother    Diabetes Mother    Heart failure Mother    Stroke Father    Kidney disease Neg Hx    Kidney cancer Neg Hx    Bladder Cancer Neg Hx    Allergies  Allergen Reactions   Clindamycin/Lincomycin Swelling    Throat swelling   Codeine     Unknown per pt    Keflex [Cephalexin]     dizzy   Levofloxacin     Unknown per pt    Rofecoxib     Unknown per pt    Sulfa Antibiotics    Valtrex [Valacyclovir Hcl]     Unknown per pt    Adhesive [Tape] Rash   Nitrofurantoin Nausea Only   Penicillins Swelling    mouth swelling   Raloxifene Rash   Tramadol     dizzy   Current Outpatient Medications on File Prior to Visit  Medication Sig Dispense Refill   atenolol (TENORMIN) 50 MG tablet TAKE 1 TABLET BY MOUTH EVERY DAY 90 tablet 0   AYR SALINE NASAL NA Place 1 spray into the nose daily as needed (allergies).      clotrimazole-betamethasone (LOTRISONE) cream Apply 1 application  topically as needed.     fluorometholone (FML) 0.1 % ophthalmic suspension Place 1 drop into both eyes 2 (two) times daily.     fluticasone (FLONASE) 50 MCG/ACT nasal spray SPRAY 2 SPRAYS INTO EACH NOSTRIL EVERY DAY 48 mL 0   hydrocortisone (ANUSOL-HC) 25 MG suppository Place 1 suppository (25 mg total) rectally 2 (two) times daily as needed for hemorrhoids or anal itching. 12 suppository 1   loperamide (IMODIUM) 2 MG capsule Take 2  mg by mouth as needed for diarrhea or  loose stools (has been taking with Clindamycin).     Multiple Vitamin (MULTIVITAMIN) capsule Take 1 capsule by mouth daily.     simvastatin (ZOCOR) 20 MG tablet TAKE 1 TABLET BY MOUTH EVERY DAY 90 tablet 0   No current facility-administered medications on file prior to visit.     Review of Systems  Constitutional:  Negative for activity change, appetite change, fatigue, fever and unexpected weight change.  HENT:  Positive for rhinorrhea. Negative for congestion, ear pain, postnasal drip, sinus pressure and sore throat.   Eyes:  Negative for pain, redness and visual disturbance.  Respiratory:  Negative for cough, shortness of breath and wheezing.   Cardiovascular:  Negative for chest pain and palpitations.  Gastrointestinal:  Negative for abdominal pain, blood in stool, constipation and diarrhea.  Endocrine: Negative for polydipsia and polyuria.  Genitourinary:  Negative for dysuria, frequency and urgency.  Musculoskeletal:  Positive for arthralgias. Negative for back pain and myalgias.  Skin:  Positive for wound. Negative for pallor and rash.  Allergic/Immunologic: Negative for environmental allergies.  Neurological:  Positive for light-headedness. Negative for dizziness, syncope and headaches.  Hematological:  Negative for adenopathy. Does not bruise/bleed easily.  Psychiatric/Behavioral:  Negative for decreased concentration and dysphoric mood. The patient is not nervous/anxious.        Objective:   Physical Exam Constitutional:      General: She is not in acute distress.    Appearance: Normal appearance. She is well-developed. She is ill-appearing. She is not diaphoretic.     Comments: Underweight   HENT:     Head: Normocephalic and atraumatic.     Right Ear: Tympanic membrane, ear canal and external ear normal. There is no impacted cerumen.     Left Ear: Tympanic membrane, ear canal and external ear normal. There is no impacted cerumen.      Nose: Rhinorrhea present.     Mouth/Throat:     Mouth: Mucous membranes are moist.     Pharynx: Oropharynx is clear. No oropharyngeal exudate or posterior oropharyngeal erythema.  Eyes:     General:        Right eye: No discharge.        Left eye: No discharge.     Conjunctiva/sclera: Conjunctivae normal.     Pupils: Pupils are equal, round, and reactive to light.  Neck:     Thyroid: No thyromegaly.     Vascular: No carotid bruit or JVD.  Cardiovascular:     Rate and Rhythm: Normal rate and regular rhythm.     Heart sounds: Normal heart sounds.     No gallop.  Pulmonary:     Effort: Pulmonary effort is normal. No respiratory distress.     Breath sounds: Normal breath sounds. No stridor. No wheezing, rhonchi or rales.  Abdominal:     General: There is no distension or abdominal bruit.     Palpations: Abdomen is soft.     Tenderness: There is no right CVA tenderness or left CVA tenderness.     Comments: No suprapubic tenderness or fullness    Musculoskeletal:     Cervical back: Normal range of motion and neck supple. No tenderness.     Right lower leg: No edema.     Left lower leg: No edema.  Lymphadenopathy:     Cervical: No cervical adenopathy.  Skin:    General: Skin is warm and dry.     Coloration: Skin is not pale.     Findings: No bruising, lesion  or rash.     Comments: 1 cm abrasion R upper lip and L cheekbone Slt swelling  No ecchymosis and no tenderness     Neurological:     Mental Status: She is alert.     Coordination: Coordination normal.     Deep Tendon Reflexes: Reflexes are normal and symmetric. Reflexes normal.  Psychiatric:        Mood and Affect: Mood normal.           Assessment & Plan:   Problem List Items Addressed This Visit       Respiratory   Allergic rhinitis    More runny nose lately  No sinus pain or cough or fever  Has zyrtec to use prn Does like being outside         Genitourinary   Frequent UTI    ua today is  concentrated, disc inc water intake sm blood and wbc Culture pending currently  No symptoms but had a recent fall Will watch closely  She has urology appt later this mo as well  Has done well recently in terms of uti       Relevant Orders   Urine Culture     Other   Dizziness - Primary    Had a fall sat -hit her lip area but not her head  Some mild headache/temples and mental fuzziness  This is improving by the day Reassuring exam  May be mild concussion from abrupt movement of head  Will watch closely  Disc s/s of concussion in detail  If symptoms fail to improve more or if worse will order CT head        Fall at home    Abrasions on R lip and L cheek -not infected Tetanus utd No head trauma but cannot rule out mild concussion   Will watch closely        Falls frequently    With adv age and muscle loss No doubt would benefit from PT for gait tx She declines for now  Given handout  Disc imp of protein intake       Other Visit Diagnoses     Weakness       Relevant Orders   POCT Urinalysis Dipstick (Automated) (Completed)

## 2022-04-08 LAB — URINE CULTURE
MICRO NUMBER:: 14055524
SPECIMEN QUALITY:: ADEQUATE

## 2022-04-09 ENCOUNTER — Ambulatory Visit: Payer: Medicare HMO | Admitting: Urology

## 2022-04-09 DIAGNOSIS — C44329 Squamous cell carcinoma of skin of other parts of face: Secondary | ICD-10-CM | POA: Diagnosis not present

## 2022-04-09 DIAGNOSIS — Z85828 Personal history of other malignant neoplasm of skin: Secondary | ICD-10-CM | POA: Diagnosis not present

## 2022-04-16 ENCOUNTER — Telehealth: Payer: Self-pay | Admitting: Family Medicine

## 2022-04-16 NOTE — Telephone Encounter (Signed)
Patient scheduled mychart appointment for dizziness, lightheadedness, and weakness. Sent over to access nurse.

## 2022-04-16 NOTE — Telephone Encounter (Signed)
Agree with ER precautions  I will see her then

## 2022-04-16 NOTE — Telephone Encounter (Signed)
[place] Day - Client TELEPHONE ADVICE RECORD Access[facility]urse Patient Name: ENRIQUE WEISS Gender: Female DOB: 22-Apr-1927 Age: 88 Y 57 M 16 D Return Phone Number: [phone] (Primary), [phone] (Secondary) Address: [place]/ State/ Zip: [place]  [address] Client [facility] - Client Client Site [place] Provider Glori Bickers, Roque Lias - MD Contact Type Call Who Is Calling Patient / Member / Family / Caregiver Call Type Triage / Clinical Relationship To Patient Self Return Phone Number [phone] (Primary) Chief Complaint Dizziness Reason for Call Symptomatic / Request for [place] states she has dizziness, light headedness and weakness. Translation No Nurse Assessment Nurse: Ysidro Evert, RN, Levada Dy Date/Time (Eastern Time): 04/16/2022 8:25:51 AM Confirm and document reason for call. If symptomatic, describe symptoms. ---Caller states she is having dizziness that started a week or two ago. Does the patient have any new or worsening symptoms? ---Yes Will a triage be completed? ---Yes Related visit to physician within the last 2 weeks? ---No Does the PT have any chronic conditions? (i.e. diabetes, asthma, this includes High risk factors for pregnancy, etc.) ---No Is this a behavioral health or substance abuse call? ---No Guidelines Guideline Title Affirmed Question Affirmed Notes Nurse Date/Time (Eastern Time) Dizziness - Vertigo SEVERE dizziness (vertigo) (e.g., unable to walk without assistance) Ysidro Evert, RN, Levada Dy 04/16/2022 8:27:25 AM Disp. Time Eilene Ghazi Time) Disposition Final User 04/16/2022 8:32:51 AM Go to ED Now (or PCP triage) Yes Ysidro Evert, RN, Levada Dy Final Disposition 04/16/2022 8:32:51 AM Go to ED Now (or PCP triage) Yes Ysidro Evert, RN, Levada Dy PLEASE NOTE: All timestamps contained within this report are represented as [place]  Standard Time. CONFIDENTIALTY NOTICE: This fax transmission is intended only for the addressee. It contains information that is legally privileged, confidential or otherwise protected from use or disclosure. If you are not the intended recipient, you are strictly prohibited from reviewing, disclosing, copying using or disseminating any of this information or taking any action in reliance on or regarding this information. If you have received this fax in error, please notify us immediately by telephone so that we can arrange for its return to [place]. Phone: [phone], Toll-Free: [phone], Fax: [phone] Page: 2 of 2 Call Id: [id number] [place] Disagree/Comply Disagree Caller Understands Yes PreDisposition Did not know what to do Care Advice Given Per Guideline GO TO ED NOW (OR PCP TRIAGE): * IF NO PCP (PRIMARY CARE PROVIDER) SECOND-LEVEL TRIAGE: You need to be seen within the next hour. Go to the [facility] at _____________ [place] as soon as you can. * Another adult should drive. NOTE TO TRIAGER - AMBULANCE TRANSPORT: * If the patient cannot walk at all because of severe dizziness, then the patient may need to be transported via ambulance. CARE ADVICE given per Dizziness - Vertigo (Adult) guideline. * Patient should not delay going to the emergency department. Referrals GO TO FACILITY REFUSE

## 2022-04-16 NOTE — Telephone Encounter (Signed)
I spoke with pt; pt said that she had surgery for CA on cheek and finished abx on a Tues; either 04/08/22 or 04/15/22. Pt said she has been lightheaded on and off but has not been lightheaded today. Pt is not sure last time lightheaded. Pt is going to day to get stitches out and will ask nurse to take BP. Pt said she is also having some generalized weakness. Pt fell in June and since then pt trying to be more careful with walking and holds to things to steady herself. Pt said she still has her drivers license but pt has decided on her own to stop driving. Pt already has appt with Dr Glori Bickers on 04/21/22 at 11:30. I offered pt an appt sooner with different provider at [facility] and pt said she prefers to see Dr Glori Bickers only. UC & ED precautions given and pt voiced understanding and appreciative of call. Sending note to Dr Glori Bickers and Banks CMA.

## 2022-04-21 ENCOUNTER — Ambulatory Visit (INDEPENDENT_AMBULATORY_CARE_PROVIDER_SITE_OTHER): Payer: Medicare HMO | Admitting: Family Medicine

## 2022-04-21 ENCOUNTER — Ambulatory Visit: Payer: Medicare HMO | Admitting: Family Medicine

## 2022-04-21 ENCOUNTER — Encounter: Payer: Self-pay | Admitting: Family Medicine

## 2022-04-21 VITALS — BP 143/78 | HR 73 | Temp 97.9°F | Ht 64.0 in | Wt 99.1 lb

## 2022-04-21 DIAGNOSIS — R531 Weakness: Secondary | ICD-10-CM | POA: Diagnosis not present

## 2022-04-21 DIAGNOSIS — R296 Repeated falls: Secondary | ICD-10-CM

## 2022-04-21 DIAGNOSIS — D329 Benign neoplasm of meninges, unspecified: Secondary | ICD-10-CM | POA: Diagnosis not present

## 2022-04-21 DIAGNOSIS — R42 Dizziness and giddiness: Secondary | ICD-10-CM

## 2022-04-21 MED ORDER — ESTRADIOL 0.1 MG/GM VA CREA: 1.0000 | TOPICAL_CREAM | VAGINAL | 1 refills | Status: DC

## 2022-04-21 NOTE — Progress Notes (Signed)
Subjective:    Patient ID: Paula Jimenez, female    DOB: 1927-03-22, 88 y.o.   MRN: [id number]  HPI Pt presents for weakness/light headed and memory problems   Wt Readings from Last 3 Encounters:  04/21/22 99 lb 2 oz (45 kg)  04/07/22 98 lb 6 oz (44.6 kg)  12/18/21 101 lb (45.8 kg)   17.01 kg/m  Last visit had fallen- was obv for head injury  Declined PT for gait/balance  Urine was concentrated but no uti on cx   Had some skin cancer surg on her cheek  More light headed since  Was px doxycycline for this = took it twice daily  Does feel a little better since finishing this   Also stopped her D3 , ? If intolerant of it  Her daughter has side effects from it and thought she may be intolerant  Her daughter gets muscle weakness from it     BP Readings from Last 3 Encounters:  04/21/22 (!) 143/78  04/07/22 136/78  11/26/21 (!) 163/90   Pulse Readings from Last 3 Encounters:  04/21/22 73  04/07/22 89  11/26/21 77   Getting more fluids   Taking a probiotic and this has helped loose stools   Also holding some tylenol because she felt more sleepy on it   Today weakness is improved, did eat  Today her dizziness is improved   No longer drives at all   Lab Results  Component Value Date   WBC 5.4 03/26/2022   HGB 13.9 03/26/2022   HCT 41.6 03/26/2022   MCV 91.4 03/26/2022   PLT 181.0 03/26/2022   Lab Results  Component Value Date   HGBA1C 6.1 03/26/2022    Lab Results  Component Value Date   CREATININE 0.63 03/26/2022   BUN 10 03/26/2022   NA 135 03/26/2022   K 3.9 03/26/2022   CL 97 03/26/2022   CO2 28 03/26/2022   Lab Results  Component Value Date   ALT 17 03/26/2022   AST 19 03/26/2022   ALKPHOS 66 03/26/2022   BILITOT 0.6 03/26/2022   Last CT in June 2023    IMPRESSION:   1.  No acute intracranial abnormality.  2.  Peripherally calcified extra-axial lesion suggesting meningioma. (R frontal vertex 16 cm)   3.  Microvascular ischemic  changes.   CT spine OSSEOUS STRUCTURES:  No acute fracture or dislocation.  Multilevel loss of disc height, endplate spurring and facet arthropathy.  Cervical spinal alignment is preserved.  No destructive osseous lesions.  At the C1 level, there is a left posterior extra-axial partially calcified lesion (image 97), most likely a meningioma   SOFT TISSUES:  No perivertebral soft tissue abnormalities.   Patient Active Problem List   Diagnosis Date Noted   Meningioma ([place]) 04/21/2022   Generalized weakness 04/21/2022   Falls frequently 04/07/2022   Fall at home 04/07/2022   Insect bite of back 09/30/2021   Varicose vein of leg 10/18/2020   Vaginal discharge 04/19/2020   Pelvic pressure in female 04/19/2020   Fatigue 05/30/2019   Onychomycosis of toenail 03/02/2019   Urethral caruncle 12/10/2018   Elevated random blood glucose level 08/25/2018   Medicare annual wellness visit, subsequent 08/25/2018   Loose stools 06/28/2018   Internal hemorrhoid, bleeding 04/20/2018   Corn of toe 04/20/2018   Ingrown nail of great toe of right foot 11/05/2017   Frequent UTI 02/16/2015   Epistaxis, recurrent 02/28/2014   History of shingles 02/20/2014  Dizziness 01/06/2014   Hearing loss 09/23/2007   History of melanoma 02/25/2007   Hyperlipidemia 02/25/2007   MITRAL VALVE PROLAPSE 02/25/2007   Allergic rhinitis 02/25/2007   DEGENERATIVE DISC DISEASE, LUMBOSACRAL SPINE 02/25/2007   Age related osteoporosis 02/25/2007   HYPERTENSION, BENIGN 10/26/2006   Past Medical History:  Diagnosis Date   Allergy    Asthma    years ago, 2-3 times a year pt experiences a problem with breathing.   Bursitis, hip    Family history of adverse reaction to anesthesia    DAUGHTER had a reaction to ansethesia   Hyperlipidemia    Hypertension    HYPERTENSION, BENIGN 10/26/2006   Qualifier: Diagnosis of  By: Selinda Orion     MITRAL VALVE PROLAPSE 02/25/2007   Qualifier: History of  By: Marcelino Scot CMA, [place]     Osteopenia 08/01   Osteoporosis    Skin cancer 2015   nose   Past Surgical History:  Procedure Laterality Date   ABDOMINAL HYSTERECTOMY  1971   BREAST BIOPSY Right 06/99   fibrocystic   CYSTOSCOPY WITH BIOPSY N/A 05/31/2015   Procedure: CYSTOSCOPY WITH BIOPSY/ fulgeration;  Surgeon: Hollice Espy, MD;  Location: [facility] ORS;  Service: Urology;  Laterality: N/A;   DENTAL SURGERY     EYE SURGERY Bilateral 2011   June and August of 2011   ORIF RADIAL FRACTURE Left 11/99   arm fracture, radial no surgery   TONSILLECTOMY     Social History   Tobacco Use   Smoking status: Never   Smokeless tobacco: Never  Vaping Use   Vaping Use: Never used  Substance Use Topics   Alcohol use: No    Alcohol/week: 0.0 standard drinks of alcohol   Drug use: No   Family History  Problem Relation Age of Onset   Leukemia Sister    Cancer Sister        leukemia   Heart disease Mother    Diabetes Mother    Heart failure Mother    Stroke Father    Kidney disease Neg Hx    Kidney cancer Neg Hx    Bladder Cancer Neg Hx    Allergies  Allergen Reactions   Clindamycin/Lincomycin Swelling    Throat swelling   Codeine     Unknown per pt    Keflex [Cephalexin]     dizzy   Levofloxacin     Unknown per pt    Rofecoxib     Unknown per pt    Sulfa Antibiotics    Valtrex [Valacyclovir Hcl]     Unknown per pt    Adhesive [Tape] Rash   Nitrofurantoin Nausea Only   Penicillins Swelling    mouth swelling   Raloxifene Rash   Tramadol     dizzy   Current Outpatient Medications on File Prior to Visit  Medication Sig Dispense Refill   atenolol (TENORMIN) 50 MG tablet TAKE 1 TABLET BY MOUTH EVERY DAY 90 tablet 0   AYR SALINE NASAL NA Place 1 spray into the nose daily as needed (allergies).      fluticasone (FLONASE) 50 MCG/ACT nasal spray SPRAY 2 SPRAYS INTO EACH NOSTRIL EVERY DAY (Patient taking differently: Place 1 spray into both nostrils daily as needed.) 48 mL 0   simvastatin  (ZOCOR) 20 MG tablet TAKE 1 TABLET BY MOUTH EVERY DAY 90 tablet 0   No current facility-administered medications on file prior to visit.    Review of Systems  Constitutional:  Negative for activity  change, appetite change, fatigue, fever and unexpected weight change.  HENT:  Negative for congestion, ear pain, rhinorrhea, sinus pressure and sore throat.   Eyes:  Negative for pain, redness and visual disturbance.  Respiratory:  Negative for cough, shortness of breath and wheezing.   Cardiovascular:  Negative for chest pain and palpitations.  Gastrointestinal:  Negative for abdominal pain, blood in stool, constipation and diarrhea.  Endocrine: Negative for polydipsia and polyuria.  Genitourinary:  Negative for dysuria, frequency and urgency.  Musculoskeletal:  Negative for arthralgias, back pain and myalgias.  Skin:  Negative for pallor and rash.  Allergic/Immunologic: Negative for environmental allergies.  Neurological:  Positive for weakness and light-headedness. Negative for dizziness, syncope and headaches.  Hematological:  Negative for adenopathy. Does not bruise/bleed easily.  Psychiatric/Behavioral:  Negative for decreased concentration and dysphoric mood. The patient is not nervous/anxious.        Objective:   Physical Exam Constitutional:      General: She is not in acute distress.    Appearance: Normal appearance. She is well-developed and normal weight. She is not ill-appearing or diaphoretic.  HENT:     Head: Normocephalic and atraumatic.     Right Ear: Tympanic membrane, ear canal and external ear normal.     Left Ear: Tympanic membrane, ear canal and external ear normal.     Nose: No congestion or rhinorrhea.     Mouth/Throat:     Mouth: Mucous membranes are moist.  Eyes:     General:        Right eye: No discharge.        Left eye: No discharge.     Conjunctiva/sclera: Conjunctivae normal.     Pupils: Pupils are equal, round, and reactive to light.  Neck:      Thyroid: No thyromegaly.     Vascular: No carotid bruit or JVD.  Cardiovascular:     Rate and Rhythm: Normal rate and regular rhythm.     Heart sounds: Normal heart sounds.     No gallop.  Pulmonary:     Effort: Pulmonary effort is normal. No respiratory distress.     Breath sounds: Normal breath sounds. No stridor. No wheezing, rhonchi or rales.  Abdominal:     General: There is no distension or abdominal bruit.     Palpations: Abdomen is soft.  Musculoskeletal:     Cervical back: Normal range of motion and neck supple. No tenderness.     Right lower leg: No edema.     Left lower leg: No edema.  Lymphadenopathy:     Cervical: No cervical adenopathy.  Skin:    General: Skin is warm and dry.     Coloration: Skin is not pale.     Findings: No rash.  Neurological:     Mental Status: She is alert.     Cranial Nerves: No cranial nerve deficit or facial asymmetry.     Sensory: No sensory deficit.     Motor: Weakness present. No atrophy, abnormal muscle tone or pronator drift.     Coordination: Romberg sign negative. Coordination normal. Finger-Nose-Finger Test normal.     Gait: Gait abnormal.     Deep Tendon Reflexes: Reflexes are normal and symmetric. Reflexes normal.     Comments: Slow gait, shuffling with poor balance    Psychiatric:        Mood and Affect: Mood normal.           Assessment & Plan:   Problem List Items Addressed  This Visit       Nervous and Auditory   Meningioma [facility])    This was noted on CT incidental at outside hospital  She has poor balance and some light headedness  Offered ref for neuro opinion,pt declines for now and wants to see if she improves with PT first  Will f/u 1 mo or earlier I needed         Other   Dizziness    Ongoing since fall in June Was worse when on doxy, now improving again  Reassuring exam Known meningioma  Will closely obs She declines neuro ref for now , would like to see how she progresses first       Falls  frequently    With gen weakness nad poor balance  PT ref for [facility] given as she is homebound       Relevant Orders   Ambulatory referral to [place]   Generalized weakness - Primary    Worse since fall in June Falls frequently  Disc fall precautions Ref HH for PT and f/u in 1 mo      Relevant Orders   Ambulatory referral to [place]

## 2022-04-21 NOTE — Assessment & Plan Note (Signed)
Ongoing since fall in June Was worse when on doxy, now improving again  Reassuring exam Known meningioma  Will closely obs She declines neuro ref for now , would like to see how she progresses first

## 2022-04-21 NOTE — Assessment & Plan Note (Signed)
With gen weakness nad poor balance  PT ref for [facility] given as she is homebound

## 2022-04-21 NOTE — Assessment & Plan Note (Signed)
Worse since fall in June Falls frequently  Disc fall precautions Ref HH for PT and f/u in 1 mo

## 2022-04-21 NOTE — Assessment & Plan Note (Signed)
This was noted on CT incidental at outside hospital  She has poor balance and some light headedness  Offered ref for neuro opinion,pt declines for now and wants to see if she improves with PT first  Will f/u 1 mo or earlier I needed

## 2022-04-21 NOTE — Patient Instructions (Addendum)
Get 64 oz of fluid daily (mostly water)   Try to eat regularly  Protein with every meal and snack   Try not to fall  I will order a home health therapist to come out and do physical therapy at home and get you stronger (for gait training and strength)   If you don't get a call in the next week let us know   Follow up with me in a month  We can discuss a neurology referral to evaluate you further with the meningioma

## 2022-05-06 DIAGNOSIS — D329 Benign neoplasm of meninges, unspecified: Secondary | ICD-10-CM

## 2022-05-06 DIAGNOSIS — J45909 Unspecified asthma, uncomplicated: Secondary | ICD-10-CM

## 2022-05-06 DIAGNOSIS — E785 Hyperlipidemia, unspecified: Secondary | ICD-10-CM

## 2022-05-06 DIAGNOSIS — L600 Ingrowing nail: Secondary | ICD-10-CM

## 2022-05-06 DIAGNOSIS — Z90710 Acquired absence of both cervix and uterus: Secondary | ICD-10-CM

## 2022-05-06 DIAGNOSIS — I341 Nonrheumatic mitral (valve) prolapse: Secondary | ICD-10-CM

## 2022-05-06 DIAGNOSIS — R296 Repeated falls: Secondary | ICD-10-CM

## 2022-05-06 DIAGNOSIS — L84 Corns and callosities: Secondary | ICD-10-CM

## 2022-05-06 DIAGNOSIS — M5137 Other intervertebral disc degeneration, lumbosacral region: Secondary | ICD-10-CM

## 2022-05-06 DIAGNOSIS — I10 Essential (primary) hypertension: Secondary | ICD-10-CM

## 2022-05-06 DIAGNOSIS — M810 Age-related osteoporosis without current pathological fracture: Secondary | ICD-10-CM

## 2022-05-06 DIAGNOSIS — M25512 Pain in left shoulder: Secondary | ICD-10-CM

## 2022-05-06 DIAGNOSIS — Z85820 Personal history of malignant melanoma of skin: Secondary | ICD-10-CM

## 2022-05-06 DIAGNOSIS — Z87310 Personal history of (healed) osteoporosis fracture: Secondary | ICD-10-CM

## 2022-05-06 DIAGNOSIS — K649 Unspecified hemorrhoids: Secondary | ICD-10-CM

## 2022-05-06 DIAGNOSIS — I8390 Asymptomatic varicose veins of unspecified lower extremity: Secondary | ICD-10-CM

## 2022-05-06 DIAGNOSIS — Z9089 Acquired absence of other organs: Secondary | ICD-10-CM

## 2022-05-06 DIAGNOSIS — Z9181 History of falling: Secondary | ICD-10-CM

## 2022-05-06 DIAGNOSIS — Z87440 Personal history of urinary (tract) infections: Secondary | ICD-10-CM

## 2022-05-06 DIAGNOSIS — H9190 Unspecified hearing loss, unspecified ear: Secondary | ICD-10-CM

## 2022-05-26 ENCOUNTER — Telehealth: Payer: Self-pay | Admitting: Family Medicine

## 2022-05-26 ENCOUNTER — Encounter: Payer: Self-pay | Admitting: Family Medicine

## 2022-05-26 ENCOUNTER — Ambulatory Visit (INDEPENDENT_AMBULATORY_CARE_PROVIDER_SITE_OTHER): Payer: Medicare HMO | Admitting: Family Medicine

## 2022-05-26 VITALS — BP 142/70 | HR 77 | Temp 97.5°F | Ht 64.0 in | Wt 98.4 lb

## 2022-05-26 DIAGNOSIS — R531 Weakness: Secondary | ICD-10-CM | POA: Diagnosis not present

## 2022-05-26 DIAGNOSIS — R195 Other fecal abnormalities: Secondary | ICD-10-CM

## 2022-05-26 DIAGNOSIS — Z5982 Transportation insecurity: Secondary | ICD-10-CM | POA: Diagnosis not present

## 2022-05-26 DIAGNOSIS — D329 Benign neoplasm of meninges, unspecified: Secondary | ICD-10-CM | POA: Diagnosis not present

## 2022-05-26 DIAGNOSIS — I10 Essential (primary) hypertension: Secondary | ICD-10-CM

## 2022-05-26 DIAGNOSIS — R42 Dizziness and giddiness: Secondary | ICD-10-CM

## 2022-05-26 DIAGNOSIS — R636 Underweight: Secondary | ICD-10-CM | POA: Insufficient documentation

## 2022-05-26 MED ORDER — SIMVASTATIN 20 MG PO TABS: 20.0000 mg | ORAL_TABLET | Freq: Every day | ORAL | 3 refills | Status: DC

## 2022-05-26 MED ORDER — ATENOLOL 50 MG PO TABS: 50.0000 mg | ORAL_TABLET | Freq: Every day | ORAL | 3 refills | Status: DC

## 2022-05-26 NOTE — Telephone Encounter (Signed)
Rx was sent to the right pharmacy. [facility] [place] [facility] [place], pt advised [facility] [place] and she just needs to check with pharmacy

## 2022-05-26 NOTE — Assessment & Plan Note (Signed)
She does not drive Her caregiver has some physical limitations re: getting her around  Agreeable to a social work consult

## 2022-05-26 NOTE — Progress Notes (Signed)
Subjective:    Patient ID: Paula Jimenez, female    DOB: [birth year], 88 y.o.   MRN: [id number]  HPI Pt presents for f/u of gen weakness  Wt Readings from Last 3 Encounters:  05/26/22 98 lb 6 oz (44.6 kg)  04/21/22 99 lb 2 oz (45 kg)  04/07/22 98 lb 6 oz (44.6 kg)   16.89 kg/m   Last visit noted some fatigue/gen weakness and dizziness   It had improved after finishing course of doxy   Was dx with meningioma but declined neuro opinion so far   We ref for PT with [facility]  Enc better fluid intake and meals  Did well with her PT but then got diarrhea this weekend   (has had 2 visits so far one to eval and one for work out)  Really liked one therapist in particular   No more falls  Tries to eat three times daily  3 pm- eats a snack  Some nuts  Some meat and dairy   Had a little dizziness when she was sick  Some headaches -occ    Ate some canned salmon and thinks it was not right  Even made her dog sick  Now doing better  Keeping up fluids   Working on fluid intake before she got sick  Cannot quite tolerate the 64 oz  Now she sips all day       BP Readings from Last 3 Encounters:  05/26/22 (!) 142/70  04/21/22 (!) 143/78  04/07/22 136/78   Pulse Readings from Last 3 Encounters:  05/26/22 77  04/21/22 73  04/07/22 89   Atenolol 50 mg daily    Lab Results  Component Value Date   WBC 5.4 03/26/2022   HGB 13.9 03/26/2022   HCT 41.6 03/26/2022   MCV 91.4 03/26/2022   PLT 181.0 03/26/2022   Lab Results  Component Value Date   CREATININE 0.63 03/26/2022   BUN 10 03/26/2022   NA 135 03/26/2022   K 3.9 03/26/2022   CL 97 03/26/2022   CO2 28 03/26/2022   Lab Results  Component Value Date   ALT 17 03/26/2022   AST 19 03/26/2022   ALKPHOS 66 03/26/2022   BILITOT 0.6 03/26/2022    Lab Results  Component Value Date   TSH 1.23 03/26/2022   Lab Results  Component Value Date   HGBA1C 6.1 03/26/2022   Needs help with transportation  Her care  giver cannot always handle meals   Patient Active Problem List   Diagnosis Date Noted   Transportation insecurity 05/26/2022   Underweight due to inadequate caloric intake 05/26/2022   Meningioma ([place]) 04/21/2022   Generalized weakness 04/21/2022   Falls frequently 04/07/2022   Fall at home 04/07/2022   Insect bite of back 09/30/2021   Varicose vein of leg 10/18/2020   Pelvic pressure in female 04/19/2020   Fatigue 05/30/2019   Onychomycosis of toenail 03/02/2019   Urethral caruncle 12/10/2018   Elevated random blood glucose level 08/25/2018   Medicare annual wellness visit, subsequent 08/25/2018   Loose stools 06/28/2018   Internal hemorrhoid, bleeding 04/20/2018   Corn of toe 04/20/2018   Ingrown nail of great toe of right foot 11/05/2017   Frequent UTI 02/16/2015   Epistaxis, recurrent 02/28/2014   History of shingles 02/20/2014   Dizziness 01/06/2014   Hearing loss 09/23/2007   History of melanoma 02/25/2007   Hyperlipidemia 02/25/2007   MITRAL VALVE PROLAPSE 02/25/2007   Allergic rhinitis 02/25/2007  DEGENERATIVE DISC DISEASE, LUMBOSACRAL SPINE 02/25/2007   Age related osteoporosis 02/25/2007   HYPERTENSION, BENIGN 10/26/2006   Past Medical History:  Diagnosis Date   Allergy    Asthma    years ago, 2-3 times a year pt experiences a problem with breathing.   Bursitis, hip    Family history of adverse reaction to anesthesia    DAUGHTER had a reaction to ansethesia   Hyperlipidemia    Hypertension    HYPERTENSION, BENIGN 10/26/2006   Qualifier: Diagnosis of  By: Selinda Orion     MITRAL VALVE PROLAPSE 02/25/2007   Qualifier: History of  By: Marcelino Scot CMA, [place]     Osteopenia 08/01   Osteoporosis    Skin cancer 2015   nose   Past Surgical History:  Procedure Laterality Date   ABDOMINAL HYSTERECTOMY  1971   BREAST BIOPSY Right 06/99   fibrocystic   CYSTOSCOPY WITH BIOPSY N/A 05/31/2015   Procedure: CYSTOSCOPY WITH BIOPSY/ fulgeration;  Surgeon: Hollice Espy, MD;  Location: [facility] ORS;  Service: Urology;  Laterality: N/A;   DENTAL SURGERY     EYE SURGERY Bilateral 2011   June and August of 2011   ORIF RADIAL FRACTURE Left 11/99   arm fracture, radial no surgery   TONSILLECTOMY     Social History   Tobacco Use   Smoking status: Never   Smokeless tobacco: Never  Vaping Use   Vaping Use: Never used  Substance Use Topics   Alcohol use: No    Alcohol/week: 0.0 standard drinks of alcohol   Drug use: No   Family History  Problem Relation Age of Onset   Leukemia Sister    Cancer Sister        leukemia   Heart disease Mother    Diabetes Mother    Heart failure Mother    Stroke Father    Kidney disease Neg Hx    Kidney cancer Neg Hx    Bladder Cancer Neg Hx    Allergies  Allergen Reactions   Clindamycin/Lincomycin Swelling    Throat swelling   Codeine     Unknown per pt    Keflex [Cephalexin]     dizzy   Levofloxacin     Unknown per pt    Rofecoxib     Unknown per pt    Sulfa Antibiotics    Valtrex [Valacyclovir Hcl]     Unknown per pt    Adhesive [Tape] Rash   Nitrofurantoin Nausea Only   Penicillins Swelling    mouth swelling   Raloxifene Rash   Tramadol     dizzy   Current Outpatient Medications on File Prior to Visit  Medication Sig Dispense Refill   AYR SALINE NASAL NA Place 1 spray into the nose daily as needed (allergies).      estradiol (ESTRACE) 0.1 MG/GM vaginal cream Place 1 Applicatorful vaginally 3 (three) times a week. 42.5 g 1   fluticasone (FLONASE) 50 MCG/ACT nasal spray SPRAY 2 SPRAYS INTO EACH NOSTRIL EVERY DAY (Patient taking differently: Place 1 spray into both nostrils daily as needed.) 48 mL 0   No current facility-administered medications on file prior to visit.     Review of Systems  Constitutional:  Positive for appetite change and fatigue. Negative for activity change, fever and unexpected weight change.  HENT:  Negative for congestion, ear pain, rhinorrhea, sinus pressure and  sore throat.   Eyes:  Negative for pain, redness and visual disturbance.  Respiratory:  Negative for cough,  shortness of breath and wheezing.   Cardiovascular:  Negative for chest pain and palpitations.  Gastrointestinal:  Negative for abdominal pain, blood in stool, constipation and diarrhea.  Endocrine: Negative for polydipsia and polyuria.  Genitourinary:  Negative for dysuria, frequency and urgency.  Musculoskeletal:  Negative for arthralgias, back pain and myalgias.  Skin:  Negative for pallor and rash.  Allergic/Immunologic: Negative for environmental allergies.  Neurological:  Positive for dizziness and weakness. Negative for seizures, syncope, speech difficulty, numbness and headaches.  Hematological:  Negative for adenopathy. Does not bruise/bleed easily.  Psychiatric/Behavioral:  Negative for decreased concentration and dysphoric mood. The patient is not nervous/anxious.        Objective:   Physical Exam Constitutional:      General: She is not in acute distress.    Appearance: Normal appearance. She is well-developed. She is not ill-appearing or diaphoretic.     Comments: Underweight  Frail appearing   HENT:     Head: Normocephalic and atraumatic.  Eyes:     General: No scleral icterus.    Conjunctiva/sclera: Conjunctivae normal.     Pupils: Pupils are equal, round, and reactive to light.  Neck:     Thyroid: No thyromegaly.     Vascular: No carotid bruit or JVD.  Cardiovascular:     Rate and Rhythm: Normal rate and regular rhythm.     Heart sounds: Normal heart sounds.     No gallop.  Pulmonary:     Effort: Pulmonary effort is normal. No respiratory distress.     Breath sounds: Normal breath sounds. No wheezing or rales.  Abdominal:     General: There is no distension or abdominal bruit.     Palpations: Abdomen is soft.     Tenderness: There is no abdominal tenderness.  Musculoskeletal:     Cervical back: Normal range of motion and neck supple.     Right lower  leg: No edema.     Left lower leg: No edema.     Comments: Pt is able to stand without assistance  Balance is generally poor   No focal weakness   Lymphadenopathy:     Cervical: No cervical adenopathy.  Skin:    General: Skin is warm and dry.     Coloration: Skin is not pale.     Findings: No rash.  Neurological:     Mental Status: She is alert.     Sensory: No sensory deficit.     Coordination: Coordination normal.     Deep Tendon Reflexes: Reflexes are normal and symmetric. Reflexes normal.  Psychiatric:        Mood and Affect: Mood normal.           Assessment & Plan:   Problem List Items Addressed This Visit       Cardiovascular and Mediastinum   HYPERTENSION, BENIGN    Fair control/ for her age  BP Readings from Last 3 Encounters:  05/26/22 (!) 142/70  04/21/22 (!) 143/78  04/07/22 136/78  Plan to continue atenolol 50 mg daily         Relevant Medications   atenolol (TENORMIN) 50 MG tablet   simvastatin (ZOCOR) 20 MG tablet     Nervous and Auditory   Meningioma ([facility])    Pt has some mild headaches and dizziness She declines any further eval at her age or specialist care Inst her to call if she changes her mind or if symptoms worsen        Other  Dizziness    This is improved but not resolved No more falls Does need to work on calorie and fluid increase as well  She declines further eval of menigioma (which may not acturally be the cause)       Generalized weakness - Primary    Caregiver notes this may be due to poor calorie intake more than anything  Disc food and fluid intake in detail Disc protein sources and strategy to get more protein in   Having some transportation issues and her caregiver has limitations as well  ? If would qualify for meals on wheels or similar program  Pt agreeable to a social work consult   Will continue home PT also       Loose stools    More diarrhea this weekend after eating ? Bad salmon  Improving now    Enc good fluid intake       Transportation insecurity    She does not drive Her caregiver has some physical limitations re: getting her around  Agreeable to a social work consult       Relevant Orders   AMB Referral to [place] ([facility])   Underweight due to inadequate caloric intake    Suspect protein malnutrition  Bmi is 16.8   Spent time today rev her protein sources Plan made for 3 meals and 3 snacks protein rich  Some issues with meal prep/caregiver Consider SW consult       Relevant Orders   AMB Referral to [place] ([facility] Patients)

## 2022-05-26 NOTE — Assessment & Plan Note (Signed)
This is improved but not resolved No more falls Does need to work on calorie and fluid increase as well  She declines further eval of menigioma (which may not acturally be the cause)

## 2022-05-26 NOTE — Telephone Encounter (Signed)
I just got a note that I sent her med to the wrong pharmacy today I sent to [place]  What pharmacy does she want?  Thanks

## 2022-05-26 NOTE — Assessment & Plan Note (Signed)
Caregiver notes this may be due to poor calorie intake more than anything  Disc food and fluid intake in detail Disc protein sources and strategy to get more protein in   Having some transportation issues and her caregiver has limitations as well  ? If would qualify for meals on wheels or similar program  Pt agreeable to a social work consult   Will continue home PT also

## 2022-05-26 NOTE — Assessment & Plan Note (Addendum)
More diarrhea this weekend after eating ? Bad salmon  Improving now   Enc good fluid intake

## 2022-05-26 NOTE — Assessment & Plan Note (Signed)
Fair control/ for her age  BP Readings from Last 3 Encounters:  05/26/22 (!) 142/70  04/21/22 (!) 143/78  04/07/22 136/78   Plan to continue atenolol 50 mg daily

## 2022-05-26 NOTE — Patient Instructions (Addendum)
Keep working on fluid intake and regular meals   Eat regular meals with protein  3 meals with 3 snacks daily    Protein sources include : meat, fish, nuts and nut butters, eggs and dairy products and dried beans   If your diarrhea does not continue to improve   Continue your physical therapy to get stronger   If headaches or dizziness worsen please let us know  We may want to consider neurologic opinion

## 2022-05-26 NOTE — Assessment & Plan Note (Signed)
Suspect protein malnutrition  Bmi is 16.8   Spent time today rev her protein sources Plan made for 3 meals and 3 snacks protein rich  Some issues with meal prep/caregiver Consider SW consult

## 2022-05-26 NOTE — Assessment & Plan Note (Signed)
Pt has some mild headaches and dizziness She declines any further eval at her age or specialist care Inst her to call if she changes her mind or if symptoms worsen

## 2022-05-27 ENCOUNTER — Telehealth: Payer: Self-pay | Admitting: *Deleted

## 2022-05-27 NOTE — Telephone Encounter (Signed)
   Telephone encounter was:  Unsuccessful.  05/27/2022 Name: Paula Jimenez MRN: [id number] DOB: [birth year]  Unsuccessful outbound call made today to assist with:  Transportation Needs   Outreach Attempt:  1st Attempt  A HIPAA compliant voice message was left requesting a return call.  Instructed patient to call back at [phone]. [place] [phone] [address] , [place] [address] Email : Ashby [email or web address] [email or web address]-moran [email or web address]

## 2022-05-28 ENCOUNTER — Telehealth: Payer: Self-pay | Admitting: *Deleted

## 2022-05-28 NOTE — Telephone Encounter (Signed)
   Telephone encounter was:  Unsuccessful.  05/28/2022 Name: Paula Jimenez MRN: [id number] DOB: 06-Feb-1927  Unsuccessful outbound call made today to assist with:  Transportation Needs   Outreach Attempt:  2nd Attempt  A HIPAA compliant voice message was left requesting a return call.  Instructed patient to call back at [phone]. [place] [phone] [address] , [place] [address] Email : Ashby [email or web address] [email or web address]-[email or web address]

## 2022-05-29 ENCOUNTER — Telehealth: Payer: Self-pay | Admitting: *Deleted

## 2022-05-29 NOTE — Telephone Encounter (Signed)
   Telephone encounter was:  Successful.  05/29/2022 Name: Paula Jimenez MRN: [id number] DOB: 10-Dec-1926  Paula Jimenez is a 88 y.o. year old female who is a primary care patient of [facility], Wynelle Fanny, MD . The community resource team was consulted for assistance with Transportation Needs  Stayed on the phone while patient set up transportation through [facility] and assisted with the upcoming appt , patient aware of the rules regarding transportation , asked about meals on wheels but made aware they typically will not approve with family in the house so declined placing the referral . Care guide performed the following interventions: Patient provided with information about care guide support team and interviewed to confirm resource needs.  Follow Up Plan:  No further follow up planned at this time. The patient has been provided with needed resources. [place] [phone] [address] , [place] [address] Email : Ashby Dawes. [place]-moran [email or web address]

## 2022-06-05 ENCOUNTER — Ambulatory Visit: Payer: Medicare HMO | Admitting: Podiatry

## 2022-06-09 ENCOUNTER — Telehealth: Payer: Self-pay | Admitting: Family Medicine

## 2022-06-09 NOTE — Telephone Encounter (Signed)
Agree with that advisement  Aware, will watch for correspondence  

## 2022-06-09 NOTE — Telephone Encounter (Signed)
Please triage

## 2022-06-09 NOTE — Telephone Encounter (Signed)
I spoke with pts daughter Faye(DPR signed) pt was seen 05/26/22 with sinus type issues and now pt has prod cough ? Color of phlegm, low grade fever on and off, pt sounds "croupy" when coughs but no wheezing noted. No CP or SOB. Pt has been taking mucinex and dayquil which may be helping little bit but Massie Maroon said she would have to get pt a way to be seen. No available appts at [facility] or [facility] 06/09/22 or 06/10/22. [name] said that she is sick now also. Advised to take pt to UC in [place] for eval and possible testing. Not sure if could be flu, RSV, covid, URI or pneumonia. [name] said she will get pt a ride for pt to [place] [facility] in [place]. Sending note to Dr Glori Bickers and [employer]. UC & ED precautions given and [name] voiced understanding.

## 2022-06-09 NOTE — Telephone Encounter (Signed)
Patient daughter called and stated patient may have a bad cough but she don't have a driver and wanted to talk to Dr. Glori Bickers nurse. Call back number [phone].

## 2022-06-12 DIAGNOSIS — J069 Acute upper respiratory infection, unspecified: Secondary | ICD-10-CM | POA: Diagnosis not present

## 2022-06-14 ENCOUNTER — Encounter (HOSPITAL_COMMUNITY): Payer: Self-pay

## 2022-06-14 ENCOUNTER — Emergency Department (HOSPITAL_COMMUNITY): Payer: Medicare HMO

## 2022-06-14 ENCOUNTER — Other Ambulatory Visit: Payer: Self-pay

## 2022-06-14 ENCOUNTER — Inpatient Hospital Stay (HOSPITAL_COMMUNITY)
Admission: EM | Admit: 2022-06-14 | Discharge: 2022-06-19 | DRG: 101 | Disposition: A | Discharge status: Home Health | Payer: Medicare HMO | Attending: Internal Medicine | Admitting: Internal Medicine

## 2022-06-14 DIAGNOSIS — Z885 Allergy status to narcotic agent status: Secondary | ICD-10-CM | POA: Diagnosis not present

## 2022-06-14 DIAGNOSIS — Z881 Allergy status to other antibiotic agents status: Secondary | ICD-10-CM

## 2022-06-14 DIAGNOSIS — R404 Transient alteration of awareness: Secondary | ICD-10-CM | POA: Diagnosis not present

## 2022-06-14 DIAGNOSIS — R569 Unspecified convulsions: Secondary | ICD-10-CM | POA: Diagnosis not present

## 2022-06-14 DIAGNOSIS — Z91048 Other nonmedicinal substance allergy status: Secondary | ICD-10-CM

## 2022-06-14 DIAGNOSIS — J45909 Unspecified asthma, uncomplicated: Secondary | ICD-10-CM | POA: Diagnosis present

## 2022-06-14 DIAGNOSIS — R410 Disorientation, unspecified: Secondary | ICD-10-CM

## 2022-06-14 DIAGNOSIS — I959 Hypotension, unspecified: Secondary | ICD-10-CM | POA: Diagnosis not present

## 2022-06-14 DIAGNOSIS — R5381 Other malaise: Secondary | ICD-10-CM

## 2022-06-14 DIAGNOSIS — R059 Cough, unspecified: Secondary | ICD-10-CM | POA: Diagnosis not present

## 2022-06-14 DIAGNOSIS — J4520 Mild intermittent asthma, uncomplicated: Secondary | ICD-10-CM | POA: Diagnosis not present

## 2022-06-14 DIAGNOSIS — Z8249 Family history of ischemic heart disease and other diseases of the circulatory system: Secondary | ICD-10-CM

## 2022-06-14 DIAGNOSIS — E785 Hyperlipidemia, unspecified: Secondary | ICD-10-CM | POA: Diagnosis present

## 2022-06-14 DIAGNOSIS — I341 Nonrheumatic mitral (valve) prolapse: Secondary | ICD-10-CM

## 2022-06-14 DIAGNOSIS — W1811XA Fall from or off toilet without subsequent striking against object, initial encounter: Secondary | ICD-10-CM | POA: Diagnosis present

## 2022-06-14 DIAGNOSIS — M79641 Pain in right hand: Secondary | ICD-10-CM | POA: Diagnosis present

## 2022-06-14 DIAGNOSIS — Y92002 Bathroom of unspecified non-institutional (private) residence single-family (private) house as the place of occurrence of the external cause: Secondary | ICD-10-CM

## 2022-06-14 DIAGNOSIS — G40901 Epilepsy, unspecified, not intractable, with status epilepticus: Secondary | ICD-10-CM | POA: Diagnosis present

## 2022-06-14 DIAGNOSIS — Z79899 Other long term (current) drug therapy: Secondary | ICD-10-CM

## 2022-06-14 DIAGNOSIS — Z8673 Personal history of transient ischemic attack (TIA), and cerebral infarction without residual deficits: Secondary | ICD-10-CM | POA: Diagnosis present

## 2022-06-14 DIAGNOSIS — Z90710 Acquired absence of both cervix and uterus: Secondary | ICD-10-CM | POA: Diagnosis not present

## 2022-06-14 DIAGNOSIS — Z515 Encounter for palliative care: Secondary | ICD-10-CM

## 2022-06-14 DIAGNOSIS — D329 Benign neoplasm of meninges, unspecified: Secondary | ICD-10-CM | POA: Diagnosis present

## 2022-06-14 DIAGNOSIS — R55 Syncope and collapse: Secondary | ICD-10-CM | POA: Diagnosis present

## 2022-06-14 DIAGNOSIS — R4182 Altered mental status, unspecified: Secondary | ICD-10-CM | POA: Diagnosis not present

## 2022-06-14 DIAGNOSIS — J4489 Other specified chronic obstructive pulmonary disease: Secondary | ICD-10-CM | POA: Diagnosis present

## 2022-06-14 DIAGNOSIS — Z66 Do not resuscitate: Secondary | ICD-10-CM | POA: Diagnosis present

## 2022-06-14 DIAGNOSIS — M810 Age-related osteoporosis without current pathological fracture: Secondary | ICD-10-CM | POA: Diagnosis present

## 2022-06-14 DIAGNOSIS — M7989 Other specified soft tissue disorders: Secondary | ICD-10-CM | POA: Diagnosis not present

## 2022-06-14 DIAGNOSIS — Z882 Allergy status to sulfonamides status: Secondary | ICD-10-CM | POA: Diagnosis not present

## 2022-06-14 DIAGNOSIS — I639 Cerebral infarction, unspecified: Secondary | ICD-10-CM

## 2022-06-14 DIAGNOSIS — R1310 Dysphagia, unspecified: Secondary | ICD-10-CM | POA: Diagnosis present

## 2022-06-14 DIAGNOSIS — Z85820 Personal history of malignant melanoma of skin: Secondary | ICD-10-CM | POA: Diagnosis not present

## 2022-06-14 DIAGNOSIS — E876 Hypokalemia: Secondary | ICD-10-CM | POA: Diagnosis present

## 2022-06-14 DIAGNOSIS — I6523 Occlusion and stenosis of bilateral carotid arteries: Secondary | ICD-10-CM | POA: Diagnosis not present

## 2022-06-14 DIAGNOSIS — I10 Essential (primary) hypertension: Secondary | ICD-10-CM | POA: Diagnosis present

## 2022-06-14 DIAGNOSIS — J449 Chronic obstructive pulmonary disease, unspecified: Secondary | ICD-10-CM | POA: Diagnosis not present

## 2022-06-14 DIAGNOSIS — W19XXXA Unspecified fall, initial encounter: Principal | ICD-10-CM

## 2022-06-14 DIAGNOSIS — S199XXA Unspecified injury of neck, initial encounter: Secondary | ICD-10-CM | POA: Diagnosis not present

## 2022-06-14 DIAGNOSIS — E7800 Pure hypercholesterolemia, unspecified: Secondary | ICD-10-CM

## 2022-06-14 DIAGNOSIS — R42 Dizziness and giddiness: Secondary | ICD-10-CM | POA: Diagnosis not present

## 2022-06-14 DIAGNOSIS — J439 Emphysema, unspecified: Secondary | ICD-10-CM | POA: Diagnosis not present

## 2022-06-14 DIAGNOSIS — R4020 Unspecified coma: Secondary | ICD-10-CM | POA: Diagnosis present

## 2022-06-14 DIAGNOSIS — Z7189 Other specified counseling: Secondary | ICD-10-CM | POA: Diagnosis not present

## 2022-06-14 LAB — COMPREHENSIVE METABOLIC PANEL
ALT: 17 U/L (ref 0–44)
AST: 26 U/L (ref 15–41)
Albumin: 3.1 g/dL — ABNORMAL LOW (ref 3.5–5.0)
Alkaline Phosphatase: 61 U/L (ref 38–126)
Anion gap: 23 — ABNORMAL HIGH (ref 5–15)
BUN: 13 mg/dL (ref 8–23)
CO2: 22 mmol/L (ref 22–32)
Calcium: 7.7 mg/dL — ABNORMAL LOW (ref 8.9–10.3)
Chloride: 95 mmol/L — ABNORMAL LOW (ref 98–111)
Creatinine, Ser: 0.64 mg/dL (ref 0.44–1.00)
GFR, Estimated: >60 mL/min (ref >60)
Glucose, Bld: 133 mg/dL — ABNORMAL HIGH (ref 70–99)
Potassium: 3.1 mmol/L — ABNORMAL LOW (ref 3.5–5.1)
Sodium: 140 mmol/L (ref 135–145)
Total Bilirubin: 0.2 mg/dL — ABNORMAL LOW (ref 0.3–1.2)
Total Protein: 5.9 g/dL — ABNORMAL LOW (ref 6.5–8.1)

## 2022-06-14 LAB — I-STAT CHEM 8, ED
BUN: 13 mg/dL (ref 8–23)
Calcium, Ion: 0.36 mmol/L — CL (ref 1.15–1.40)
Chloride: 96 mmol/L — ABNORMAL LOW (ref 98–111)
Creatinine, Ser: 0.5 mg/dL (ref 0.44–1.00)
Glucose, Bld: 130 mg/dL — ABNORMAL HIGH (ref 70–99)
HCT: 41 % (ref 36.0–46.0)
Hemoglobin: 13.9 g/dL (ref 12.0–15.0)
Potassium: 3 mmol/L — ABNORMAL LOW (ref 3.5–5.1)
Sodium: 135 mmol/L (ref 135–145)
TCO2: 22 mmol/L (ref 22–32)

## 2022-06-14 LAB — SALICYLATE LEVEL: Salicylate Lvl: <7 mg/dL — ABNORMAL LOW (ref 7.0–30.0)

## 2022-06-14 LAB — PROTIME-INR
INR: 1.1 (ref 0.8–1.2)
Prothrombin Time: 13.6 seconds (ref 11.4–15.2)

## 2022-06-14 LAB — LIPID PANEL
Cholesterol: 157 mg/dL (ref 0–200)
HDL: 45 mg/dL (ref >40)
LDL Cholesterol: 97 mg/dL (ref 0–99)
Total CHOL/HDL Ratio: 3.5 RATIO
Triglycerides: 77 mg/dL (ref <150)
VLDL: 15 mg/dL (ref 0–40)

## 2022-06-14 LAB — CBC
HCT: 40.6 % (ref 36.0–46.0)
HCT: 40.7 % (ref 36.0–46.0)
Hemoglobin: 13.7 g/dL (ref 12.0–15.0)
Hemoglobin: 14.2 g/dL (ref 12.0–15.0)
MCH: 30.7 pg (ref 26.0–34.0)
MCH: 30.7 pg (ref 26.0–34.0)
MCHC: 33.7 g/dL (ref 30.0–36.0)
MCHC: 34.9 g/dL (ref 30.0–36.0)
MCV: 91 fL (ref 80.0–100.0)
MCV: 87.9 fL (ref 80.0–100.0)
Platelets: 241 10*3/uL (ref 150–400)
Platelets: 225 10*3/uL (ref 150–400)
RBC: 4.46 MIL/uL (ref 3.87–5.11)
RBC: 4.63 MIL/uL (ref 3.87–5.11)
RDW: 12.6 % (ref 11.5–15.5)
RDW: 12.6 % (ref 11.5–15.5)
WBC: 8.5 10*3/uL (ref 4.0–10.5)
WBC: 8.8 10*3/uL (ref 4.0–10.5)
nRBC: 0 % (ref 0.0–0.2)
nRBC: 0 % (ref 0.0–0.2)

## 2022-06-14 LAB — DIFFERENTIAL
Abs Immature Granulocytes: 0.05 10*3/uL (ref 0.00–0.07)
Basophils Absolute: 0 10*3/uL (ref 0.0–0.1)
Basophils Relative: 0 %
Eosinophils Absolute: 0.1 10*3/uL (ref 0.0–0.5)
Eosinophils Relative: 1 %
Immature Granulocytes: 1 %
Lymphocytes Relative: 38 %
Lymphs Abs: 3.2 10*3/uL (ref 0.7–4.0)
Monocytes Absolute: 0.5 10*3/uL (ref 0.1–1.0)
Monocytes Relative: 6 %
Neutro Abs: 4.6 10*3/uL (ref 1.7–7.7)
Neutrophils Relative %: 54 %

## 2022-06-14 LAB — APTT: aPTT: 27 seconds (ref 24–36)

## 2022-06-14 LAB — CREATININE, SERUM
Creatinine, Ser: 0.48 mg/dL (ref 0.44–1.00)
GFR, Estimated: >60 mL/min (ref >60)

## 2022-06-14 LAB — ETHANOL: Alcohol, Ethyl (B): <10 mg/dL (ref <10)

## 2022-06-14 LAB — LACTIC ACID, PLASMA
Lactic Acid, Venous: 2.3 mmol/L (ref 0.5–1.9)
Lactic Acid, Venous: 1 mmol/L (ref 0.5–1.9)

## 2022-06-14 MED ORDER — CALCIUM GLUCONATE-NACL 1-0.675 GM/50ML-% IV SOLN
1.0000 g | Freq: Once | INTRAVENOUS | Status: AC
  Administered 2022-06-14: 1000 mg via INTRAVENOUS
  Filled 2022-06-14: qty 50

## 2022-06-14 MED ORDER — SODIUM CHLORIDE 0.9% FLUSH
3.0000 mL | Freq: Once | INTRAVENOUS | Status: AC
  Administered 2022-06-14: 3 mL via INTRAVENOUS

## 2022-06-14 MED ORDER — IOHEXOL 350 MG/ML SOLN
75.0000 mL | Freq: Once | INTRAVENOUS | Status: AC | PRN
  Administered 2022-06-14: 75 mL via INTRAVENOUS

## 2022-06-14 MED ORDER — ACETAMINOPHEN 325 MG PO TABS: 650.0000 mg | ORAL_TABLET | ORAL | Status: DC | PRN

## 2022-06-14 MED ORDER — MORPHINE SULFATE (PF) 2 MG/ML IV SOLN
0.5000 mg | Freq: Four times a day (QID) | INTRAVENOUS | Status: DC
  Administered 2022-06-14 – 2022-06-15 (×2): 1 mg via INTRAVENOUS
  Filled 2022-06-14 (×2): qty 1

## 2022-06-14 MED ORDER — POTASSIUM CHLORIDE 10 MEQ/100ML IV SOLN
10.0000 meq | INTRAVENOUS | Status: AC
  Administered 2022-06-14 (×4): 10 meq via INTRAVENOUS
  Filled 2022-06-14 (×3): qty 100

## 2022-06-14 MED ORDER — ACETAMINOPHEN 160 MG/5ML PO SOLN: 650.0000 mg | ORAL | Status: DC | PRN

## 2022-06-14 MED ORDER — ACETAMINOPHEN 650 MG RE SUPP: 650.0000 mg | RECTAL | Status: DC | PRN

## 2022-06-14 MED ORDER — ENOXAPARIN SODIUM 30 MG/0.3ML IJ SOSY
30.0000 mg | PREFILLED_SYRINGE | INTRAMUSCULAR | Status: DC
  Administered 2022-06-14: 30 mg via SUBCUTANEOUS
  Filled 2022-06-14: qty 0.3

## 2022-06-14 MED ORDER — STROKE: EARLY STAGES OF RECOVERY BOOK
Freq: Once | Status: AC
  Filled 2022-06-14 (×2): qty 1

## 2022-06-14 MED ORDER — SODIUM CHLORIDE 0.9 % IV SOLN: INTRAVENOUS | Status: DC

## 2022-06-14 NOTE — Procedures (Signed)
Routine EEG Report  Paula Jimenez is a 88 y.o. female with a history of altered mental status who is undergoing an EEG to evaluate for seizures.  Report: This EEG was acquired with electrodes placed according to the International 10-20 electrode system (including Fp1, Fp2, F3, F4, C3, C4, P3, P4, O1, O2, T3, T4, T5, T6, A1, A2, Fz, Cz, Pz). The following electrodes were missing or displaced: none.  The occipital dominant rhythm was 7 Hz with intermittent superimposed further diffuse slowing. This activity is reactive to stimulation. Drowsiness was manifested by background fragmentation; deeper stages of sleep were not identified. There was no focal slowing. There were frequent runs q 3-4 seconds of generalized periodic discharges up to 2-3 Hz lasting 3 seconds or less. There were no electrographic seizures identified. Photic stimulation and hyperventilation were not performed.  Impression and clinical correlation: This EEG was obtained while awake and drowsy and is abnormal due to: - Mild-to-moderate diffuse slowing indicative of global cerebral dysfunction - Frequent Brief Potentially Ictal Rhythmic Discharges (BIRDs) lasting 3 seconds or less indicating increased epileptic potential  No definitive electrographic seizures were observed. Recommend prolonged EEG.  Su Monks, MD [facility] [phone]  If 7pm- 7am, please page neurology on call as listed in [place].

## 2022-06-14 NOTE — ED Notes (Signed)
Lab results reported to EDP. 

## 2022-06-14 NOTE — H&P (Signed)
[facility] History and Physical  Paula Jimenez WNU:[id number] DOB: 11-21-1926 DOA: 06/14/2022  Referring physician:  PCP: Abner Greenspan, MD   Chief Complaint: Fall with LOC  HPI: Paula Jimenez is a 88 y.o. WF PMHx   HLD, HTN, mitral valve prolapse, melanoma, osteoporosis, asthma.  She has had cough and congestion over the past week.  She was seen at urgent care 2 days ago for this and underwent chest x-ray.  Yesterday, she complained to family about a "funny feeling" in her head.  This continued today.  While in the bathroom, she had an unwitnessed fall.  Family heard her fall and went to check on her.  She did have a loss of consciousness that lasted for approximately 6 minutes.  At baseline, patient ambulates without walker or cane.  She does not have any dementia or memory deficits at baseline.  EMS reports initial GCS of 5 with concern of posturing.  Responsiveness improved during transit.       Review of Systems:  Covid vaccination;  Constitutional:  No weight loss, night sweats, Fevers, chills, fatigue.  Cachectic HEENT:  No headaches, Difficulty swallowing,Tooth/dental problems,Sore throat,  No sneezing, itching, ear ache, nasal congestion, post nasal drip,  Cardio-vascular:  No chest pain, Orthopnea, PND, swelling in lower extremities, anasarca, dizziness, palpitations  GI:  No heartburn, indigestion, abdominal pain, nausea, vomiting, diarrhea, change in bowel habits, loss of appetite  Resp:  No shortness of breath with exertion or at rest. No excess mucus, no productive cough, No non-productive cough, No coughing up of blood.No change in color of mucus.No wheezing.No chest wall deformity  Skin:  no rash or lesions.  GU:  no dysuria, change in color of urine, no urgency or frequency. No flank pain.  Musculoskeletal:  No joint pain or swelling. No decreased range of motion. No back pain.  Psych:  No change in mood or affect. No depression or anxiety. No memory  loss.   Past Medical History:  Diagnosis Date   Allergy    Asthma    years ago, 2-3 times a year pt experiences a problem with breathing.   Bursitis, hip    Family history of adverse reaction to anesthesia    DAUGHTER had a reaction to ansethesia   Hyperlipidemia    Hypertension    HYPERTENSION, BENIGN 10/26/2006   Qualifier: Diagnosis of  By: Selinda Orion     MITRAL VALVE PROLAPSE 02/25/2007   Qualifier: History of  By: Marcelino Scot CMA, [place]     Osteopenia 08/01   Osteoporosis    Skin cancer 2015   nose   Past Surgical History:  Procedure Laterality Date   ABDOMINAL HYSTERECTOMY  1971   BREAST BIOPSY Right 06/99   fibrocystic   CYSTOSCOPY WITH BIOPSY N/A 05/31/2015   Procedure: CYSTOSCOPY WITH BIOPSY/ fulgeration;  Surgeon: Hollice Espy, MD;  Location: [facility] ORS;  Service: Urology;  Laterality: N/A;   DENTAL SURGERY     EYE SURGERY Bilateral 2011   June and August of 2011   ORIF RADIAL FRACTURE Left 11/99   arm fracture, radial no surgery   TONSILLECTOMY     Social History:  reports that she has never smoked. She has never used smokeless tobacco. She reports that she does not drink alcohol and does not use drugs.  Allergies  Allergen Reactions   Clindamycin/Lincomycin Swelling    Throat swelling   Codeine Other (See Comments)    Unknown reaction    Keflex [Cephalexin] Other (  See Comments)    Dizziness    Levofloxacin Other (See Comments)    Unknown reaction   Sulfa Antibiotics Other (See Comments)    Unknown reaction   Valtrex [Valacyclovir Hcl] Other (See Comments)    Unknown reaction    Vioxx [Rofecoxib] Other (See Comments)    Unknown reaction   Adhesive [Tape] Rash   Evista [Raloxifene] Rash   Macrobid [Nitrofurantoin] Nausea Only   Penicillins Swelling    Mouth swelling   Ultram [Tramadol] Other (See Comments)    Dizziness     Family History  Problem Relation Age of Onset   Leukemia Sister    Cancer Sister        leukemia   Heart disease  Mother    Diabetes Mother    Heart failure Mother    Stroke Father    Kidney disease Neg Hx    Kidney cancer Neg Hx    Bladder Cancer Neg Hx      Prior to Admission medications   Medication Sig Start Date End Date Taking? Authorizing Provider  atenolol (TENORMIN) 50 MG tablet Take 1 tablet (50 mg total) by mouth daily. Patient taking differently: Take 50 mg by mouth in the morning. 05/26/22  Yes Tower, Wynelle Fanny, MD  AYR SALINE NASAL NA Place 1 spray into the nose daily as needed (allergies).    Yes [provider]  estradiol (ESTRACE) 0.1 MG/GM vaginal cream Place 1 Applicatorful vaginally 3 (three) times a week. 04/21/22  Yes Tower, Wynelle Fanny, MD  fluticasone (FLONASE) 50 MCG/ACT nasal spray SPRAY 2 SPRAYS INTO EACH NOSTRIL EVERY DAY Patient taking differently: Place 1 spray into both nostrils daily as needed for allergies. 02/28/22  Yes Tower, Wynelle Fanny, MD  simvastatin (ZOCOR) 20 MG tablet Take 1 tablet (20 mg total) by mouth daily. Patient taking differently: Take 20 mg by mouth in the morning. 05/26/22  Yes Tower, Wynelle Fanny, MD     Consultants:  Neurology  Procedures/Significant Events:  12/23 EEG pending   I have personally reviewed and interpreted all radiology studies and my findings are as above.   VENTILATOR SETTINGS:     Cultures 12/23 blood pending 12/23 urine pending   Antimicrobials:    Devices    LINES / TUBES:      Continuous Infusions:  sodium chloride      Physical Exam: Vitals:   06/14/22 1845 06/14/22 1900 06/14/22 1909 06/14/22 2015  BP: (!) 168/95 (!) 162/81  (!) 173/93  Pulse: 75 71  83  Resp: (!) 22 (!) 25  (!) 22  Temp:   98.6 F (37 C)   TempSrc:   Oral   SpO2: 97% 99%  99%  Weight:      Height:        Wt Readings from Last 3 Encounters:  06/14/22 44.6 kg  05/26/22 44.6 kg  04/21/22 45 kg    General: A/O x 1, agitated, No acute respiratory distress, cachectic Eyes: negative scleral hemorrhage, negative anisocoria,  negative icterus ENT: Negative Runny nose, negative gingival bleeding, Neck:  Negative scars, masses, torticollis, lymphadenopathy, JVD Lungs: Clear to auscultation bilaterally without wheezes or crackles Cardiovascular: Regular rate and rhythm without murmur gallop or rub normal S1 and S2 Abdomen: negative abdominal pain, nondistended, positive soft, bowel sounds, no rebound, no ascites, no appreciable mass Extremities: No significant cyanosis, clubbing, or edema bilateral lower extremities Skin: Negative rashes, lesions, ulcers Psychiatric: Unable to evaluate secondary stroke vs seizure Central nervous system: Spontaneously moving  upper body and trying to roll over Unable to evaluate secondary stroke vs seizure       Labs on Admission:  Basic Metabolic Panel: Recent Labs  Lab 06/14/22 1425 06/14/22 1431  NA 140 135  K 3.1* 3.0*  CL 95* 96*  CO2 22  --   GLUCOSE 133* 130*  BUN 13 13  CREATININE 0.64 0.50  CALCIUM 7.7*  --    Liver Function Tests: Recent Labs  Lab 06/14/22 1425  AST 26  ALT 17  ALKPHOS 61  BILITOT 0.2*  PROT 5.9*  ALBUMIN 3.1*   No results for input(s): "LIPASE", "AMYLASE" in the last 168 hours. No results for input(s): "AMMONIA" in the last 168 hours. CBC: Recent Labs  Lab 06/14/22 1425 06/14/22 1431  WBC 8.5  --   NEUTROABS 4.6  --   HGB 13.7 13.9  HCT 40.6 41.0  MCV 91.0  --   PLT 241  --    Cardiac Enzymes: No results for input(s): "CKTOTAL", "CKMB", "CKMBINDEX", "TROPONINI" in the last 168 hours.  BNP (last 3 results) No results for input(s): "BNP" in the last 8760 hours.  ProBNP (last 3 results) No results for input(s): "PROBNP" in the last 8760 hours.  CBG: No results for input(s): "GLUCAP" in the last 168 hours.  Radiological Exams on Admission: EEG adult  Result Date: 06/14/2022 Derek Jack, MD     06/14/2022  3:53 PM Routine EEG Report Paula Jimenez is a 88 y.o. female with a history of altered mental status who  is undergoing an EEG to evaluate for seizures. Report: This EEG was acquired with electrodes placed according to the International 10-20 electrode system (including Fp1, Fp2, F3, F4, C3, C4, P3, P4, O1, O2, T3, T4, T5, T6, A1, A2, Fz, Cz, Pz). The following electrodes were missing or displaced: none. The occipital dominant rhythm was 7 Hz with intermittent superimposed further diffuse slowing. This activity is reactive to stimulation. Drowsiness was manifested by background fragmentation; deeper stages of sleep were not identified. There was no focal slowing. There were frequent runs q 3-4 seconds of generalized periodic discharges up to 2-3 Hz lasting 3 seconds or less. There were no electrographic seizures identified. Photic stimulation and hyperventilation were not performed. Impression and clinical correlation: This EEG was obtained while awake and drowsy and is abnormal due to: - Mild-to-moderate diffuse slowing indicative of global cerebral dysfunction - Frequent Brief Potentially Ictal Rhythmic Discharges (BIRDs) lasting 3 seconds or less indicating increased epileptic potential No definitive electrographic seizures were observed. Recommend prolonged EEG. Su Monks, MD [facility] [phone] If 7pm- 7am, please page neurology on call as listed in [place].   CT CERVICAL SPINE WO CONTRAST  Result Date: 06/14/2022 CLINICAL DATA:  Neck trauma EXAM: CT Cervical Spine with contrast TECHNIQUE: Multiplanar CT images of the cervical spine were reconstructed from contemporary CTA of the Neck. RADIATION DOSE REDUCTION: This exam was performed according to the departmental dose-optimization program which includes automated exposure control, adjustment of the mA and/or kV according to patient size and/or use of iterative reconstruction technique. COMPARISON:  None Available. FINDINGS: Alignment: Normal. There is no antero or retrolisthesis. There is no jumped or perched facet or other evidence of  traumatic malalignment. Skull base and vertebrae: Skull base alignment is maintained. Vertebral body heights are preserved. There is no acute fracture. There is no suspicious osseous lesion. There is fusion of the posterior elements from C3 through C5 on the left and at C4-C5 on the  right. Soft tissues and spinal canal: No prevertebral fluid or swelling. No visible canal hematoma. Disc levels: There is overall mild for age degenerative change in the cervical spine with mild multilevel facet arthropathy, and disc space narrowing degenerative endplate change most advanced at C5-C6 and C6-C7. There is no evidence of high-grade osseous spinal canal or neural foraminal stenosis. Upper chest: There is scarring in the lung apices. Other: None. IMPRESSION: No acute fracture or traumatic malalignment of the cervical spine. Electronically Signed   By: Valetta Mole M.D.   On: 06/14/2022 15:25   CT HEAD CODE STROKE WO CONTRAST  Result Date: 06/14/2022 CLINICAL DATA:  Code stroke.  Neck trauma EXAM: CT ANGIOGRAPHY HEAD AND NECK TECHNIQUE: Multidetector CT imaging of the head and neck was performed using the standard protocol during bolus administration of intravenous contrast. Multiplanar CT image reconstructions and MIPs were obtained to evaluate the vascular anatomy. Carotid stenosis measurements (when applicable) are obtained utilizing NASCET criteria, using the distal internal carotid diameter as the denominator. RADIATION DOSE REDUCTION: This exam was performed according to the departmental dose-optimization program which includes automated exposure control, adjustment of the mA and/or kV according to patient size and/or use of iterative reconstruction technique. CONTRAST:  22m OMNIPAQUE IOHEXOL 350 MG/ML SOLN COMPARISON:  None Available. FINDINGS: CT HEAD FINDINGS Brain: There is no acute intracranial hemorrhage, extra-axial fluid collection, or acute infarct Parenchymal volume is normal for age. The ventricles are  normal in size. Gray-white differentiation is preserved. There is confluent hypodensity throughout the supratentorial white matter likely reflecting advanced background chronic small-vessel ischemic change. There are probable remote infarcts in the bilateral thalami. There is a partially calcified extra-axial lesion overlying the high right frontal lobe at the vertex measuring 1.3 cm x 1.6 cm consistent with a meningioma. There is no significant mass effect on the underlying brain parenchyma or midline shift. Vascular: No hyperdense vessel or unexpected calcification. There is calcification of the bilateral carotid siphons and vertebral arteries. Skull: Normal. Negative for fracture or focal lesion. Sinuses/Orbits: There is layering fluid in the bilateral maxillary sinuses. Bilateral lens implants are in place. The globes and orbits are otherwise unremarkable. Other: None. CTA NECK FINDINGS Aortic arch: There is calcified plaque in the imaged aortic arch. The origins of the major branch vessels are patent. The subclavian arteries are patent to the level imaged. Right carotid system: The right common, internal, and external carotid arteries are patent, with mild plaque of the bifurcation but no hemodynamically significant stenosis or occlusion. There is no dissection or aneurysm. Left carotid system: The left common, internal, and external carotid arteries are patent, with mild plaque at the bifurcation but no hemodynamically significant stenosis or occlusion. There is no dissection or aneurysm. Vertebral arteries: The vertebral arteries are patent, without hemodynamically significant stenosis or occlusion. There is no dissection or aneurysm. Skeleton: There is no acute osseous abnormality or suspicious osseous lesion. There is no visible canal hematoma. Other neck: There is a 9 mm right thyroid nodule requiring no specific imaging follow-up. Upper chest: There is biapical scarring. Review of the MIP images confirms  the above findings CTA HEAD FINDINGS Anterior circulation: There is mild calcified plaque in the carotid siphons but no hemodynamically significant stenosis or occlusion. The bilateral MCAs are patent, without proximal stenosis or occlusion. The bilateral ACAs are patent, without proximal stenosis or occlusion. There is no aneurysm or AVM. Posterior circulation: The bilateral V4 segments are patent. The basilar artery is patent. The major cerebellar arteries appear  patent. The bilateral PCAs are patent, without proximal stenosis or occlusion. A right posterior communicating artery is identified. There is no aneurysm or AVM. Venous sinuses: Not well assessed due to bolus timing. Anatomic variants: None. Review of the MIP images confirms the above findings IMPRESSION: 1. No acute intracranial pathology. 2. Patent vasculature of the head and neck with no hemodynamically significant stenosis or occlusion. 3. Advanced background chronic small-vessel ischemic change. 4. 1.6 cm meningioma overlying the high right frontal lobe. 5. Layering fluid in the maxillary sinuses could reflect acute sinusitis in the correct clinical setting. Findings from the noncontrast head CT were communicated to Dr. Erlinda Hong via amion at 2:38 pm. CTA findings were communicated at 2:52 p.m. Electronically Signed   By: Valetta Mole M.D.   On: 06/14/2022 15:21   CT ANGIO HEAD NECK W WO CM  Result Date: 06/14/2022 CLINICAL DATA:  Code stroke.  Neck trauma EXAM: CT ANGIOGRAPHY HEAD AND NECK TECHNIQUE: Multidetector CT imaging of the head and neck was performed using the standard protocol during bolus administration of intravenous contrast. Multiplanar CT image reconstructions and MIPs were obtained to evaluate the vascular anatomy. Carotid stenosis measurements (when applicable) are obtained utilizing NASCET criteria, using the distal internal carotid diameter as the denominator. RADIATION DOSE REDUCTION: This exam was performed according to the  departmental dose-optimization program which includes automated exposure control, adjustment of the mA and/or kV according to patient size and/or use of iterative reconstruction technique. CONTRAST:  70m OMNIPAQUE IOHEXOL 350 MG/ML SOLN COMPARISON:  None Available. FINDINGS: CT HEAD FINDINGS Brain: There is no acute intracranial hemorrhage, extra-axial fluid collection, or acute infarct Parenchymal volume is normal for age. The ventricles are normal in size. Gray-white differentiation is preserved. There is confluent hypodensity throughout the supratentorial white matter likely reflecting advanced background chronic small-vessel ischemic change. There are probable remote infarcts in the bilateral thalami. There is a partially calcified extra-axial lesion overlying the high right frontal lobe at the vertex measuring 1.3 cm x 1.6 cm consistent with a meningioma. There is no significant mass effect on the underlying brain parenchyma or midline shift. Vascular: No hyperdense vessel or unexpected calcification. There is calcification of the bilateral carotid siphons and vertebral arteries. Skull: Normal. Negative for fracture or focal lesion. Sinuses/Orbits: There is layering fluid in the bilateral maxillary sinuses. Bilateral lens implants are in place. The globes and orbits are otherwise unremarkable. Other: None. CTA NECK FINDINGS Aortic arch: There is calcified plaque in the imaged aortic arch. The origins of the major branch vessels are patent. The subclavian arteries are patent to the level imaged. Right carotid system: The right common, internal, and external carotid arteries are patent, with mild plaque of the bifurcation but no hemodynamically significant stenosis or occlusion. There is no dissection or aneurysm. Left carotid system: The left common, internal, and external carotid arteries are patent, with mild plaque at the bifurcation but no hemodynamically significant stenosis or occlusion. There is no  dissection or aneurysm. Vertebral arteries: The vertebral arteries are patent, without hemodynamically significant stenosis or occlusion. There is no dissection or aneurysm. Skeleton: There is no acute osseous abnormality or suspicious osseous lesion. There is no visible canal hematoma. Other neck: There is a 9 mm right thyroid nodule requiring no specific imaging follow-up. Upper chest: There is biapical scarring. Review of the MIP images confirms the above findings CTA HEAD FINDINGS Anterior circulation: There is mild calcified plaque in the carotid siphons but no hemodynamically significant stenosis or occlusion. The bilateral MCAs  are patent, without proximal stenosis or occlusion. The bilateral ACAs are patent, without proximal stenosis or occlusion. There is no aneurysm or AVM. Posterior circulation: The bilateral V4 segments are patent. The basilar artery is patent. The major cerebellar arteries appear patent. The bilateral PCAs are patent, without proximal stenosis or occlusion. A right posterior communicating artery is identified. There is no aneurysm or AVM. Venous sinuses: Not well assessed due to bolus timing. Anatomic variants: None. Review of the MIP images confirms the above findings IMPRESSION: 1. No acute intracranial pathology. 2. Patent vasculature of the head and neck with no hemodynamically significant stenosis or occlusion. 3. Advanced background chronic small-vessel ischemic change. 4. 1.6 cm meningioma overlying the high right frontal lobe. 5. Layering fluid in the maxillary sinuses could reflect acute sinusitis in the correct clinical setting. Findings from the noncontrast head CT were communicated to Dr. Erlinda Hong via amion at 2:38 pm. CTA findings were communicated at 2:52 p.m. Electronically Signed   By: Valetta Mole M.D.   On: 06/14/2022 15:21    EKG: Independently reviewed.   Assessment/Plan Principal Problem:   Stroke [facility]) Active Problems:   HLD (hyperlipidemia)   Essential  hypertension   Asthma, chronic   Mitral valve prolapse  Stroke - Stroke order set used for admission - Seizure workup per neurology -Blood and urine culture pending  Essential HTN/mitral valve prolapse - Elevated allow permissive HTN until stroke ruled out. -Echocardiogram pending  HLD Lipid panel pending     Mobility Assessment (last 72 hours)     Mobility Assessment   No documentation.           Code Status: Full (DVT Prophylaxis: Lovenox Family Communication: 12/23 daughter and son at bedside for discussion of plan of care all questions answered Status is: Inpatient    Dispo: The patient is from: Home              Anticipated d/c is to: Home              Anticipated d/c date is: 2 days              Patient currently is not medically stable to d/c.     Data Reviewed: Care during the described time interval was provided by me .  I have reviewed this patient's available data, including medical history, events of note, physical examination, and all test results as part of my evaluation.   The patient is critically ill with multiple organ systems failure and requires high complexity decision making for assessment and support, frequent evaluation and titration of therapies, application of advanced monitoring technologies and extensive interpretation of multiple databases. Critical Care Time devoted to patient care services described in this note  Time spent: 70 minutes   Dorotea Hand, [facility]

## 2022-06-14 NOTE — ED Notes (Signed)
Ccollar removed with permission of Regenia Skeeter MD

## 2022-06-14 NOTE — Progress Notes (Signed)
LTM EEG hooked up and running - no initial skin breakdown - push button tested - [facility] NOT monitoring. Pt is in ED 

## 2022-06-14 NOTE — Consult Note (Signed)
Neurology Consultation  Reason for Consult: Right-sided gaze and weakness after a fall Referring Physician: Dr. Doren Custard  CC: None  History is obtained from: Patient, EMS and chart  HPI: Paula Jimenez is a 88 y.o. female history of hypertension, hyperlipidemia and meningioma who presents after falling in the bathroom at home a little after 1 PM today.  She was last known to be well at 9:15 in the morning when she awoke and ate breakfast.  Her family heard a thump in the bathroom a little after 1 PM and found her on the floor.  EMS was called, and patient was noted to have decreased responsiveness with initial GCS of 5 and decorticate posturing.  Patient improved spontaneously with GCS of 14 noted on arrival to the ED.  Of note, she was seen in urgent care on 12/21 for cough and was deemed to be recovering from a viral illness.  Has had decreased oral intake due to this illness for about a week.  Patient was seen to have an incidental meningioma on head CT at an outside hospital a few months ago, but has declined workup for this.   LKW: 0915 TNK given?: no, outside of window IR Thrombectomy? No, no LVO Modified Rankin Scale: 3-Moderate disability-requires help but walks WITHOUT assistance  ROS: Unable to obtain due to altered mental status.   Past Medical History:  Diagnosis Date   Allergy    Asthma    years ago, 2-3 times a year pt experiences a problem with breathing.   Bursitis, hip    Family history of adverse reaction to anesthesia    DAUGHTER had a reaction to ansethesia   Hyperlipidemia    Hypertension    HYPERTENSION, BENIGN 10/26/2006   Qualifier: Diagnosis of  By: Selinda Orion     MITRAL VALVE PROLAPSE 02/25/2007   Qualifier: History of  By: Marcelino Scot CMA, Margot Chimes 08/01   Osteoporosis    Skin cancer 2015   nose     Family History  Problem Relation Age of Onset   Leukemia Sister    Cancer Sister        leukemia   Heart disease Mother    Diabetes  Mother    Heart failure Mother    Stroke Father    Kidney disease Neg Hx    Kidney cancer Neg Hx    Bladder Cancer Neg Hx      Social History:   reports that she has never smoked. She has never used smokeless tobacco. She reports that she does not drink alcohol and does not use drugs.  Medications  Current Facility-Administered Medications:    sodium chloride flush (NS) 0.9 % injection 3 mL, 3 mL, Intravenous, Once, Godfrey Pick, MD  Current Outpatient Medications:    atenolol (TENORMIN) 50 MG tablet, Take 1 tablet (50 mg total) by mouth daily., Disp: 90 tablet, Rfl: 3   AYR SALINE NASAL NA, Place 1 spray into the nose daily as needed (allergies). , Disp: , Rfl:    estradiol (ESTRACE) 0.1 MG/GM vaginal cream, Place 1 Applicatorful vaginally 3 (three) times a week., Disp: 42.5 g, Rfl: 1   fluticasone (FLONASE) 50 MCG/ACT nasal spray, SPRAY 2 SPRAYS INTO EACH NOSTRIL EVERY DAY (Patient taking differently: Place 1 spray into both nostrils daily as needed.), Disp: 48 mL, Rfl: 0   simvastatin (ZOCOR) 20 MG tablet, Take 1 tablet (20 mg total) by mouth daily., Disp: 90 tablet, Rfl: 3   Exam:  Current vital signs: BP (!) 161/74 (BP Location: Left Arm)   Pulse 74   Temp 98.7 F (37.1 C) (Temporal)   Resp 20   Ht '5\' 4"'$  (1.626 m)   Wt 44.6 kg   SpO2 98%   BMI 16.88 kg/m  Vital signs in last 24 hours: Temp:  [98.7 F (37.1 C)] 98.7 F (37.1 C) (12/23 1450) Pulse Rate:  [74] 74 (12/23 1450) Resp:  [20] 20 (12/23 1450) BP: (161)/(74) 161/74 (12/23 1450) SpO2:  [98 %] 98 % (12/23 1450) Weight:  [44.6 kg] 44.6 kg (12/23 1400)  GENERAL: Awake, alert, in no acute distress Psych: Affect appropriate for situation, patient is calm and cooperative with examination Head: Normocephalic and atraumatic, without obvious abnormality EENT: Normal conjunctivae, dry mucous membranes, no OP obstruction LUNGS: Normal respiratory effort. Non-labored breathing on room air Extremities: warm, well  perfused, without obvious deformity  NEURO:  Mental Status: Awake, alert, and oriented to person and place but disoriented to time and situation She is not able to provide a clear and coherent history of present illness. Speech/Language: speech is Faythe Dingwall and in short phrases.   Repetition, fluency, and comprehension intact without aphasia  No neglect is noted Cranial Nerves:  II: PERRL possible visual field cut on the left III, IV, VI: EOMI. Lid elevation symmetric and full.  V: Sensation is intact to light touch and symmetrical to face. Blinks to threat on the right but not the left VII: Face is symmetric resting and smiling.  VIII: Hearing intact to voice IX, X: Phonation normal with quiet voice XI: Normal sternocleidomastoid and trapezius muscle strength XII: Tongue protrudes midline without fasciculations.   Motor: 5/5 strength is all muscle groups.  Tone is normal. Bulk is normal.  Sensation: Intact to light touch bilaterally in all four extremities.  Left-sided extinction noted Coordination: FTN intact bilaterally.   Gait: Deferred  NIHSS: 1a Level of Conscious.: 0 1b LOC Questions: 2 1c LOC Commands: 0 2 Best Gaze: 0 3 Visual: 1 4 Facial Palsy:0  5a Motor Arm - left: 0 5b Motor Arm - Right: 0 6a Motor Leg - Left: 0 6b Motor Leg - Right: 0 7 Limb Ataxia: 0 8 Sensory: 1 9 Best Language: 0 10 Dysarthria: 0 11 Extinct. and Inatten.: 2 TOTAL:  6  Labs I have reviewed labs in epic and the results pertinent to this consultation are:   CBC    Component Value Date/Time   WBC 8.5 06/14/2022 1425   RBC 4.46 06/14/2022 1425   HGB 13.9 06/14/2022 1431   HCT 41.0 06/14/2022 1431   PLT 241 06/14/2022 1425   MCV 91.0 06/14/2022 1425   MCH 30.7 06/14/2022 1425   MCHC 33.7 06/14/2022 1425   RDW 12.6 06/14/2022 1425   LYMPHSABS 3.2 06/14/2022 1425   MONOABS 0.5 06/14/2022 1425   EOSABS 0.1 06/14/2022 1425   BASOSABS 0.0 06/14/2022 1425    CMP     Component Value  Date/Time   NA 135 06/14/2022 1431   K 3.0 (L) 06/14/2022 1431   CL 96 (L) 06/14/2022 1431   CO2 28 03/26/2022 0922   GLUCOSE 130 (H) 06/14/2022 1431   BUN 13 06/14/2022 1431   CREATININE 0.50 06/14/2022 1431   CALCIUM 8.9 03/26/2022 0922   PROT 6.1 03/26/2022 0922   ALBUMIN 3.9 03/26/2022 0922   AST 19 03/26/2022 0922   ALT 17 03/26/2022 0922   ALKPHOS 66 03/26/2022 0922   BILITOT 0.6 03/26/2022 0922   GFRNONAA  88.15 05/08/2010 0952   GFRAA 104 10/26/2006 0922    Lipid Panel     Component Value Date/Time   CHOL 157 03/26/2022 0922   TRIG 101.0 03/26/2022 0922   HDL 56.20 03/26/2022 0922   CHOLHDL 3 03/26/2022 0922   VLDL 20.2 03/26/2022 0922   LDLCALC 81 03/26/2022 0922   LDLDIRECT 159.5 02/19/2009 0840     Imaging I have reviewed the images obtained:  CT-scan of the brain no acute abnormality  CTA head and neck: No LVO or hemodynamically significant stenosis  MRI examination of the brain: Pending  EEG pending  Assessment: 88 year old patient with history of hypertension, hyperlipidemia, osteoporosis and meningioma presents after a fall at home with decreased responsiveness and weakness and left-sided neglect.  Patient had been seen on 12/21 for cough and has been noted to have decreased oral intake and not feeling well for about a week prior to admission.  On exam, she is disoriented to time and situation, perseverates on answers to questions and appears generally encephalopathic.  CT head is negative for acute abnormality, and CTA is negative for LVO or hemodynamically significant stenosis.  She is outside the window for TNK.  Will obtain MRI of the brain to rule out acute stroke given questionable left-sided neglect and not blinking to threat on the left.  Will begin stroke workup, but will hold off on dual antiplatelet therapy until MRI is back.  Will also obtain stat EEG to rule out seizure activity given known meningioma and unwitnessed fall at home with decreased  responsiveness afterwards.  Impression: Toxic metabolic encephalopathy in setting of recent infection versus seizure versus syncope, less likely for stroke  Recommendations: -Stat EEG - Admit for stroke workup - Permissive HTN x48 hrs from sx onset or until stroke ruled out by MRI goal BP <220/110. PRN labetalol or hydralazine if BP above these parameters. Avoid oral antihypertensives. - MRI brain wo contrast - TTE w/ bubble - Check A1c and LDL + add statin per guidelines - antiplt/anticoag to be determined after MRI - q4 hr neuro checks - STAT head CT for any change in neuro exam - Tele - PT/OT/SLP - Stroke education - Amb referral to neurology upon discharge    Pt seen by NP/Neuro and later by MD. Note/plan to be edited by MD as needed.  [place] , MSN, AGACNP-BC [facility] See Amion for schedule and pager information 06/14/2022 3:02 PM  ATTENDING NOTE: I reviewed above note and agree with the assessment and plan. Pt was seen and examined.   88 year old female with history of hypertension, hyperlipidemia, recent diagnosis of meningioma presented to ED for fall at home with altered mental status, posturing.  Last seen well 9:15 AM this morning around 1:25 PM daughter heard a thump sound in the bathroom and patient was found to on the floor.  Initially patient unresponsive with GCS 5 and decorticate posturing.  Usual to ER, patient mental status gradually improving, concerning for right side decreased sensation.  But then also concerning for left arm drift.  ER patient GCS improved to 14, gradually more awake alert, however still disorientated and with perseveration and repeating questions but no focal deficit.  CT no acute abnormality except known meningioma.  CT head and neck negative.  Stat EEG concerning for birds, put on long-term EEG.  Etiology for patient's symptoms not quite clear.  Patient does have recent viral illness and decreased p.o. intake,  concerning for orthostatic hypotension/syncope at home.  However with stat EEG finding, seizure also possibility.  Encephalopathy is in the differential but less likely for stroke.  Recommend MRI and long-term EEG at this time.  Continue home medication once passed swallow.  Recommend IV fluid and avoid low BP.  Check orthostatic vitals once stable.  Will follow.  For detailed assessment and plan, please refer to above/below as I have made changes wherever appropriate.   Rosalin Hawking, MD PhD Stroke Neurology 06/14/2022 10:48 PM

## 2022-06-14 NOTE — ED Provider Notes (Addendum)
[facility] EMERGENCY DEPARTMENT Provider Note   CSN: [id number] Arrival date & time: 06/14/22  1416     History  Chief Complaint  Patient presents with   Code Stroke    Paula Jimenez is a 88 y.o. female.  HPI Patient presents for fall.  Medical history includes HLD, HTN, melanoma, osteoporosis, asthma.  She has had cough and congestion over the past week.  She was seen at urgent care 2 days ago for this and underwent chest x-ray.  Yesterday, she complained to family about a "funny feeling" in her head.  This continued today.  While in the bathroom, she had an unwitnessed fall.  Family heard her fall and went to check on her.  She did have a loss of consciousness that lasted for approximately 6 minutes.  At baseline, patient ambulates without walker or cane.  She does not have any dementia or memory deficits at baseline.  EMS reports initial GCS of 5 with concern of posturing.  Responsiveness improved during transit.      Home Medications Prior to Admission medications   Medication Sig Start Date End Date Taking? Authorizing Provider  atenolol (TENORMIN) 50 MG tablet Take 1 tablet (50 mg total) by mouth daily. Patient taking differently: Take 50 mg by mouth in the morning. 05/26/22  Yes Tower, Wynelle Fanny, MD  AYR SALINE NASAL NA Place 1 spray into the nose daily as needed (allergies).    Yes [provider]  estradiol (ESTRACE) 0.1 MG/GM vaginal cream Place 1 Applicatorful vaginally 3 (three) times a week. 04/21/22  Yes Tower, Wynelle Fanny, MD  fluticasone (FLONASE) 50 MCG/ACT nasal spray SPRAY 2 SPRAYS INTO EACH NOSTRIL EVERY DAY Patient taking differently: Place 1 spray into both nostrils daily as needed for allergies. 02/28/22  Yes Tower, Wynelle Fanny, MD  simvastatin (ZOCOR) 20 MG tablet Take 1 tablet (20 mg total) by mouth daily. Patient taking differently: Take 20 mg by mouth in the morning. 05/26/22  Yes Tower, Wynelle Fanny, MD      Allergies     Clindamycin/lincomycin, Codeine, Keflex [cephalexin], Levofloxacin, Sulfa antibiotics, Valtrex [valacyclovir hcl], Vioxx [rofecoxib], Adhesive [tape], Evista [raloxifene], Macrobid [nitrofurantoin], Penicillins, and Ultram [tramadol]    Review of Systems   Review of Systems  Unable to perform ROS: Mental status change    Physical Exam Updated Vital Signs BP (!) 149/84   Pulse 75   Temp 98.7 F (37.1 C) (Temporal)   Resp (!) 25   Ht '5\' 4"'$  (1.626 m)   Wt 44.6 kg   SpO2 94%   BMI 16.88 kg/m  Physical Exam Vitals and nursing note reviewed.  Constitutional:      General: She is not in acute distress.    Appearance: She is well-developed and normal weight. She is not ill-appearing, toxic-appearing or diaphoretic.  HENT:     Head: Normocephalic and atraumatic.     Right Ear: External ear normal.     Left Ear: External ear normal.     Nose: Nose normal.     Mouth/Throat:     Comments: Abrasion to lip with minor swelling Eyes:     Extraocular Movements: Extraocular movements intact.     Conjunctiva/sclera: Conjunctivae normal.  Cardiovascular:     Rate and Rhythm: Normal rate and regular rhythm.     Heart sounds: No murmur heard. Pulmonary:     Effort: Pulmonary effort is normal. No respiratory distress.  Abdominal:     General: There is no distension.  Palpations: Abdomen is soft.     Tenderness: There is no abdominal tenderness.  Musculoskeletal:        General: No swelling. Normal range of motion.     Cervical back: Normal range of motion and neck supple. No rigidity.     Right lower leg: No edema.     Left lower leg: No edema.  Skin:    General: Skin is warm and dry.     Capillary Refill: Capillary refill takes less than 2 seconds.     Coloration: Skin is not jaundiced or pale.  Neurological:     General: No focal deficit present.     Mental Status: She is alert. She is disoriented.     Cranial Nerves: No cranial nerve deficit.     Sensory: No sensory deficit.      Motor: No weakness.  Psychiatric:        Mood and Affect: Mood normal.        Behavior: Behavior normal.     ED Results / Procedures / Treatments   Labs (all labs ordered are listed, but only abnormal results are displayed) Labs Reviewed  COMPREHENSIVE METABOLIC PANEL - Abnormal; Notable for the following components:      Result Value   Potassium 3.1 (*)    Chloride 95 (*)    Glucose, Bld 133 (*)    Calcium 7.7 (*)    Total Protein 5.9 (*)    Albumin 3.1 (*)    Total Bilirubin 0.2 (*)    Anion gap 23 (*)    All other components within normal limits  I-STAT CHEM 8, ED - Abnormal; Notable for the following components:   Potassium 3.0 (*)    Chloride 96 (*)    Glucose, Bld 130 (*)    Calcium, Ion 0.36 (*)    All other components within normal limits  PROTIME-INR  APTT  CBC  DIFFERENTIAL  ETHANOL  LACTIC ACID, PLASMA  LACTIC ACID, PLASMA  SALICYLATE LEVEL  CBG MONITORING, ED    EKG None  Radiology CT CERVICAL SPINE WO CONTRAST  Result Date: 06/14/2022 CLINICAL DATA:  Neck trauma EXAM: CT Cervical Spine with contrast TECHNIQUE: Multiplanar CT images of the cervical spine were reconstructed from contemporary CTA of the Neck. RADIATION DOSE REDUCTION: This exam was performed according to the departmental dose-optimization program which includes automated exposure control, adjustment of the mA and/or kV according to patient size and/or use of iterative reconstruction technique. COMPARISON:  None Available. FINDINGS: Alignment: Normal. There is no antero or retrolisthesis. There is no jumped or perched facet or other evidence of traumatic malalignment. Skull base and vertebrae: Skull base alignment is maintained. Vertebral body heights are preserved. There is no acute fracture. There is no suspicious osseous lesion. There is fusion of the posterior elements from C3 through C5 on the left and at C4-C5 on the right. Soft tissues and spinal canal: No prevertebral fluid or  swelling. No visible canal hematoma. Disc levels: There is overall mild for age degenerative change in the cervical spine with mild multilevel facet arthropathy, and disc space narrowing degenerative endplate change most advanced at C5-C6 and C6-C7. There is no evidence of high-grade osseous spinal canal or neural foraminal stenosis. Upper chest: There is scarring in the lung apices. Other: None. IMPRESSION: No acute fracture or traumatic malalignment of the cervical spine. Electronically Signed   By: Valetta Mole M.D.   On: 06/14/2022 15:25   CT HEAD CODE STROKE WO CONTRAST  Result  Date: 06/14/2022 CLINICAL DATA:  Code stroke.  Neck trauma EXAM: CT ANGIOGRAPHY HEAD AND NECK TECHNIQUE: Multidetector CT imaging of the head and neck was performed using the standard protocol during bolus administration of intravenous contrast. Multiplanar CT image reconstructions and MIPs were obtained to evaluate the vascular anatomy. Carotid stenosis measurements (when applicable) are obtained utilizing NASCET criteria, using the distal internal carotid diameter as the denominator. RADIATION DOSE REDUCTION: This exam was performed according to the departmental dose-optimization program which includes automated exposure control, adjustment of the mA and/or kV according to patient size and/or use of iterative reconstruction technique. CONTRAST:  23m OMNIPAQUE IOHEXOL 350 MG/ML SOLN COMPARISON:  None Available. FINDINGS: CT HEAD FINDINGS Brain: There is no acute intracranial hemorrhage, extra-axial fluid collection, or acute infarct Parenchymal volume is normal for age. The ventricles are normal in size. Gray-white differentiation is preserved. There is confluent hypodensity throughout the supratentorial white matter likely reflecting advanced background chronic small-vessel ischemic change. There are probable remote infarcts in the bilateral thalami. There is a partially calcified extra-axial lesion overlying the high right  frontal lobe at the vertex measuring 1.3 cm x 1.6 cm consistent with a meningioma. There is no significant mass effect on the underlying brain parenchyma or midline shift. Vascular: No hyperdense vessel or unexpected calcification. There is calcification of the bilateral carotid siphons and vertebral arteries. Skull: Normal. Negative for fracture or focal lesion. Sinuses/Orbits: There is layering fluid in the bilateral maxillary sinuses. Bilateral lens implants are in place. The globes and orbits are otherwise unremarkable. Other: None. CTA NECK FINDINGS Aortic arch: There is calcified plaque in the imaged aortic arch. The origins of the major branch vessels are patent. The subclavian arteries are patent to the level imaged. Right carotid system: The right common, internal, and external carotid arteries are patent, with mild plaque of the bifurcation but no hemodynamically significant stenosis or occlusion. There is no dissection or aneurysm. Left carotid system: The left common, internal, and external carotid arteries are patent, with mild plaque at the bifurcation but no hemodynamically significant stenosis or occlusion. There is no dissection or aneurysm. Vertebral arteries: The vertebral arteries are patent, without hemodynamically significant stenosis or occlusion. There is no dissection or aneurysm. Skeleton: There is no acute osseous abnormality or suspicious osseous lesion. There is no visible canal hematoma. Other neck: There is a 9 mm right thyroid nodule requiring no specific imaging follow-up. Upper chest: There is biapical scarring. Review of the MIP images confirms the above findings CTA HEAD FINDINGS Anterior circulation: There is mild calcified plaque in the carotid siphons but no hemodynamically significant stenosis or occlusion. The bilateral MCAs are patent, without proximal stenosis or occlusion. The bilateral ACAs are patent, without proximal stenosis or occlusion. There is no aneurysm or AVM.  Posterior circulation: The bilateral V4 segments are patent. The basilar artery is patent. The major cerebellar arteries appear patent. The bilateral PCAs are patent, without proximal stenosis or occlusion. A right posterior communicating artery is identified. There is no aneurysm or AVM. Venous sinuses: Not well assessed due to bolus timing. Anatomic variants: None. Review of the MIP images confirms the above findings IMPRESSION: 1. No acute intracranial pathology. 2. Patent vasculature of the head and neck with no hemodynamically significant stenosis or occlusion. 3. Advanced background chronic small-vessel ischemic change. 4. 1.6 cm meningioma overlying the high right frontal lobe. 5. Layering fluid in the maxillary sinuses could reflect acute sinusitis in the correct clinical setting. Findings from the noncontrast head CT were  communicated to Dr. Erlinda Hong via amion at 2:38 pm. CTA findings were communicated at 2:52 p.m. Electronically Signed   By: Valetta Mole M.D.   On: 06/14/2022 15:21   CT ANGIO HEAD NECK W WO CM  Result Date: 06/14/2022 CLINICAL DATA:  Code stroke.  Neck trauma EXAM: CT ANGIOGRAPHY HEAD AND NECK TECHNIQUE: Multidetector CT imaging of the head and neck was performed using the standard protocol during bolus administration of intravenous contrast. Multiplanar CT image reconstructions and MIPs were obtained to evaluate the vascular anatomy. Carotid stenosis measurements (when applicable) are obtained utilizing NASCET criteria, using the distal internal carotid diameter as the denominator. RADIATION DOSE REDUCTION: This exam was performed according to the departmental dose-optimization program which includes automated exposure control, adjustment of the mA and/or kV according to patient size and/or use of iterative reconstruction technique. CONTRAST:  69m OMNIPAQUE IOHEXOL 350 MG/ML SOLN COMPARISON:  None Available. FINDINGS: CT HEAD FINDINGS Brain: There is no acute intracranial hemorrhage,  extra-axial fluid collection, or acute infarct Parenchymal volume is normal for age. The ventricles are normal in size. Gray-white differentiation is preserved. There is confluent hypodensity throughout the supratentorial white matter likely reflecting advanced background chronic small-vessel ischemic change. There are probable remote infarcts in the bilateral thalami. There is a partially calcified extra-axial lesion overlying the high right frontal lobe at the vertex measuring 1.3 cm x 1.6 cm consistent with a meningioma. There is no significant mass effect on the underlying brain parenchyma or midline shift. Vascular: No hyperdense vessel or unexpected calcification. There is calcification of the bilateral carotid siphons and vertebral arteries. Skull: Normal. Negative for fracture or focal lesion. Sinuses/Orbits: There is layering fluid in the bilateral maxillary sinuses. Bilateral lens implants are in place. The globes and orbits are otherwise unremarkable. Other: None. CTA NECK FINDINGS Aortic arch: There is calcified plaque in the imaged aortic arch. The origins of the major branch vessels are patent. The subclavian arteries are patent to the level imaged. Right carotid system: The right common, internal, and external carotid arteries are patent, with mild plaque of the bifurcation but no hemodynamically significant stenosis or occlusion. There is no dissection or aneurysm. Left carotid system: The left common, internal, and external carotid arteries are patent, with mild plaque at the bifurcation but no hemodynamically significant stenosis or occlusion. There is no dissection or aneurysm. Vertebral arteries: The vertebral arteries are patent, without hemodynamically significant stenosis or occlusion. There is no dissection or aneurysm. Skeleton: There is no acute osseous abnormality or suspicious osseous lesion. There is no visible canal hematoma. Other neck: There is a 9 mm right thyroid nodule requiring no  specific imaging follow-up. Upper chest: There is biapical scarring. Review of the MIP images confirms the above findings CTA HEAD FINDINGS Anterior circulation: There is mild calcified plaque in the carotid siphons but no hemodynamically significant stenosis or occlusion. The bilateral MCAs are patent, without proximal stenosis or occlusion. The bilateral ACAs are patent, without proximal stenosis or occlusion. There is no aneurysm or AVM. Posterior circulation: The bilateral V4 segments are patent. The basilar artery is patent. The major cerebellar arteries appear patent. The bilateral PCAs are patent, without proximal stenosis or occlusion. A right posterior communicating artery is identified. There is no aneurysm or AVM. Venous sinuses: Not well assessed due to bolus timing. Anatomic variants: None. Review of the MIP images confirms the above findings IMPRESSION: 1. No acute intracranial pathology. 2. Patent vasculature of the head and neck with no hemodynamically significant stenosis or  occlusion. 3. Advanced background chronic small-vessel ischemic change. 4. 1.6 cm meningioma overlying the high right frontal lobe. 5. Layering fluid in the maxillary sinuses could reflect acute sinusitis in the correct clinical setting. Findings from the noncontrast head CT were communicated to Dr. Erlinda Hong via amion at 2:38 pm. CTA findings were communicated at 2:52 p.m. Electronically Signed   By: Valetta Mole M.D.   On: 06/14/2022 15:21    Procedures Procedures    Medications Ordered in ED Medications  potassium chloride 10 mEq in 100 mL IVPB (has no administration in time range)  calcium gluconate 1 g/ 50 mL sodium chloride IVPB (has no administration in time range)  sodium chloride flush (NS) 0.9 % injection 3 mL (3 mLs Intravenous Given 06/14/22 1525)  iohexol (OMNIPAQUE) 350 MG/ML injection 75 mL (75 mLs Intravenous Contrast Given 06/14/22 1508)    ED Course/ Medical Decision Making/ A&P                            Medical Decision Making Amount and/or Complexity of Data Reviewed Labs: ordered. Radiology: ordered.  Risk Prescription drug management. Decision regarding hospitalization.   This patient presents to the ED for concern of loss of consciousness, fall, and altered mental status, this involves an extensive number of treatment options, and is a complaint that carries with it a high risk of complications and morbidity.  The differential diagnosis includes CVA, ICH, C-spine injury, seizure, metabolic derangements   Co morbidities that complicate the patient evaluation  HLD, HTN, melanoma, osteoporosis, asthma   Additional history obtained:  Additional history obtained from patient's family External records from outside source obtained and reviewed including EMR   Lab Tests:  I Ordered, and personally interpreted labs.  The pertinent results include: Hypokalemia and hypocalcemia; elevated anion gap with normal bicarb.  Hemoglobin is normal and there is no leukocytosis.   Imaging Studies ordered:  I ordered imaging studies including CT head, CTA head and neck, CT cervical spine I independently visualized and interpreted imaging which showed no ICH or LVO.  Layering fluid in maxillary sinus. I agree with the radiologist interpretation   Cardiac Monitoring: / EKG:  The patient was maintained on a cardiac monitor.  I personally viewed and interpreted the cardiac monitored which showed an underlying rhythm of: Sinus rhythm   Consultations Obtained:  I requested consultation with neurology,  and discussed lab and imaging findings as well as pertinent plan - they recommend: Admission for stroke workup, permissive hypertension, MRI brain, TTE, and EEG   Problem List / ED Course / Critical interventions / Medication management  Patient is a highly functional 88 year old female who presents for fall, loss of consciousness, and altered mental status.  She did complain of a funny  feeling in her head yesterday which continued today.  She was noted to have mild confusion yesterday which also worsened today.  She had an unwitnessed fall in the bathroom and had an estimated 6 minutes loss of consciousness.  On arrival in the ED, she is GCS 15 but disoriented.  No focal deficits are appreciated on exam.  She arrived as a code stroke, directly to [facility].  CT scan did not reveal any LVO or ICH.  She was seen in conjunction with neurology who recommends admission for further seizure versus syncope versus stroke workup. I ordered medication including potassium chloride and calcium gluconate for electrolyte optimization Reevaluation of the patient after these medicines showed that  the patient stayed the same I have reviewed the patients home medicines and have made adjustments as needed   Social Determinants of Health:  Lives at home with family        Final Clinical Impression(s) / ED Diagnoses Final diagnoses:  Fall, initial encounter  Loss of consciousness [facility])    Rx / DC Orders ED Discharge Orders     None         Godfrey Pick, MD 06/14/22 1536    Godfrey Pick, MD 06/14/22 1620

## 2022-06-14 NOTE — ED Notes (Signed)
Pt unable to answer any orientation questions correctly at this time, mostly replying "I dont know" or "yeah" regardless of what questions is asked. Pt does not follow direction for more than a moment. Restless. Pt cleaned, changed, and new linin/gown provided.

## 2022-06-14 NOTE — Progress Notes (Signed)
EEG complete - results pending 

## 2022-06-15 ENCOUNTER — Observation Stay (HOSPITAL_BASED_OUTPATIENT_CLINIC_OR_DEPARTMENT_OTHER): Payer: Medicare HMO

## 2022-06-15 ENCOUNTER — Encounter (HOSPITAL_COMMUNITY): Payer: Self-pay | Admitting: Internal Medicine

## 2022-06-15 DIAGNOSIS — I341 Nonrheumatic mitral (valve) prolapse: Secondary | ICD-10-CM | POA: Diagnosis present

## 2022-06-15 DIAGNOSIS — I639 Cerebral infarction, unspecified: Secondary | ICD-10-CM | POA: Diagnosis not present

## 2022-06-15 DIAGNOSIS — Y92002 Bathroom of unspecified non-institutional (private) residence single-family (private) house as the place of occurrence of the external cause: Secondary | ICD-10-CM | POA: Diagnosis not present

## 2022-06-15 DIAGNOSIS — R55 Syncope and collapse: Secondary | ICD-10-CM | POA: Diagnosis present

## 2022-06-15 DIAGNOSIS — J4489 Other specified chronic obstructive pulmonary disease: Secondary | ICD-10-CM | POA: Diagnosis present

## 2022-06-15 DIAGNOSIS — Z8249 Family history of ischemic heart disease and other diseases of the circulatory system: Secondary | ICD-10-CM | POA: Diagnosis not present

## 2022-06-15 DIAGNOSIS — Z7189 Other specified counseling: Secondary | ICD-10-CM | POA: Diagnosis not present

## 2022-06-15 DIAGNOSIS — Z90710 Acquired absence of both cervix and uterus: Secondary | ICD-10-CM | POA: Diagnosis not present

## 2022-06-15 DIAGNOSIS — R569 Unspecified convulsions: Secondary | ICD-10-CM

## 2022-06-15 DIAGNOSIS — G40901 Epilepsy, unspecified, not intractable, with status epilepticus: Secondary | ICD-10-CM | POA: Diagnosis present

## 2022-06-15 DIAGNOSIS — Z881 Allergy status to other antibiotic agents status: Secondary | ICD-10-CM | POA: Diagnosis not present

## 2022-06-15 DIAGNOSIS — I10 Essential (primary) hypertension: Secondary | ICD-10-CM

## 2022-06-15 DIAGNOSIS — Z79899 Other long term (current) drug therapy: Secondary | ICD-10-CM | POA: Diagnosis not present

## 2022-06-15 DIAGNOSIS — Z885 Allergy status to narcotic agent status: Secondary | ICD-10-CM | POA: Diagnosis not present

## 2022-06-15 DIAGNOSIS — W1811XA Fall from or off toilet without subsequent striking against object, initial encounter: Secondary | ICD-10-CM | POA: Diagnosis present

## 2022-06-15 DIAGNOSIS — E785 Hyperlipidemia, unspecified: Secondary | ICD-10-CM | POA: Diagnosis present

## 2022-06-15 DIAGNOSIS — E876 Hypokalemia: Secondary | ICD-10-CM | POA: Diagnosis present

## 2022-06-15 DIAGNOSIS — E7800 Pure hypercholesterolemia, unspecified: Secondary | ICD-10-CM | POA: Diagnosis not present

## 2022-06-15 DIAGNOSIS — R1310 Dysphagia, unspecified: Secondary | ICD-10-CM | POA: Diagnosis present

## 2022-06-15 DIAGNOSIS — Z66 Do not resuscitate: Secondary | ICD-10-CM | POA: Diagnosis present

## 2022-06-15 DIAGNOSIS — I959 Hypotension, unspecified: Secondary | ICD-10-CM | POA: Diagnosis not present

## 2022-06-15 DIAGNOSIS — Z85820 Personal history of malignant melanoma of skin: Secondary | ICD-10-CM | POA: Diagnosis not present

## 2022-06-15 DIAGNOSIS — J4520 Mild intermittent asthma, uncomplicated: Secondary | ICD-10-CM | POA: Diagnosis not present

## 2022-06-15 DIAGNOSIS — M79641 Pain in right hand: Secondary | ICD-10-CM | POA: Diagnosis present

## 2022-06-15 DIAGNOSIS — W19XXXA Unspecified fall, initial encounter: Secondary | ICD-10-CM | POA: Diagnosis not present

## 2022-06-15 DIAGNOSIS — Z515 Encounter for palliative care: Secondary | ICD-10-CM | POA: Diagnosis not present

## 2022-06-15 DIAGNOSIS — Z882 Allergy status to sulfonamides status: Secondary | ICD-10-CM | POA: Diagnosis not present

## 2022-06-15 DIAGNOSIS — Z91048 Other nonmedicinal substance allergy status: Secondary | ICD-10-CM | POA: Diagnosis not present

## 2022-06-15 DIAGNOSIS — D329 Benign neoplasm of meninges, unspecified: Secondary | ICD-10-CM | POA: Diagnosis present

## 2022-06-15 DIAGNOSIS — R4020 Unspecified coma: Secondary | ICD-10-CM | POA: Diagnosis present

## 2022-06-15 DIAGNOSIS — M810 Age-related osteoporosis without current pathological fracture: Secondary | ICD-10-CM | POA: Diagnosis present

## 2022-06-15 LAB — ECHOCARDIOGRAM COMPLETE
Height: 64 in
Weight: 1573.2 oz

## 2022-06-15 MED ORDER — SODIUM CHLORIDE 0.9 % IV SOLN
200.0000 mg | Freq: Once | INTRAVENOUS | Status: DC
  Filled 2022-06-15: qty 20

## 2022-06-15 MED ORDER — LEVETIRACETAM IN NACL 500 MG/100ML IV SOLN
500.0000 mg | Freq: Once | INTRAVENOUS | Status: AC
  Administered 2022-06-15: 500 mg via INTRAVENOUS

## 2022-06-15 MED ORDER — ONDANSETRON HCL 4 MG/2ML IJ SOLN: 4.0000 mg | Freq: Four times a day (QID) | INTRAMUSCULAR | Status: DC | PRN

## 2022-06-15 MED ORDER — ACETAMINOPHEN 650 MG RE SUPP: 650.0000 mg | Freq: Four times a day (QID) | RECTAL | Status: DC | PRN

## 2022-06-15 MED ORDER — GLYCOPYRROLATE 0.2 MG/ML IJ SOLN: 0.2000 mg | INTRAMUSCULAR | Status: DC | PRN

## 2022-06-15 MED ORDER — LORAZEPAM 2 MG/ML IJ SOLN: 1.0000 mg | INTRAMUSCULAR | Status: DC | PRN

## 2022-06-15 MED ORDER — POLYVINYL ALCOHOL 1.4 % OP SOLN: 1.0000 [drp] | Freq: Four times a day (QID) | OPHTHALMIC | Status: DC | PRN

## 2022-06-15 MED ORDER — DIPHENHYDRAMINE HCL 50 MG/ML IJ SOLN: 12.5000 mg | Freq: Four times a day (QID) | INTRAMUSCULAR | Status: DC | PRN

## 2022-06-15 MED ORDER — SODIUM CHLORIDE 0.9 % IV SOLN
2500.0000 mg | Freq: Once | INTRAVENOUS | Status: DC
  Filled 2022-06-15: qty 25

## 2022-06-15 MED ORDER — BIOTENE DRY MOUTH MT LIQD: 15.0000 mL | OROMUCOSAL | Status: DC | PRN

## 2022-06-15 MED ORDER — LEVETIRACETAM IN NACL 1000 MG/100ML IV SOLN
1000.0000 mg | Freq: Once | INTRAVENOUS | Status: AC
  Administered 2022-06-15: 1000 mg via INTRAVENOUS
  Filled 2022-06-15: qty 100

## 2022-06-15 MED ORDER — LEVETIRACETAM IN NACL 1000 MG/100ML IV SOLN
1000.0000 mg | Freq: Once | INTRAVENOUS | Status: AC
  Administered 2022-06-15: 1000 mg via INTRAVENOUS

## 2022-06-15 MED ORDER — MORPHINE SULFATE (PF) 2 MG/ML IV SOLN
1.0000 mg | Freq: Four times a day (QID) | INTRAVENOUS | Status: DC | PRN
  Administered 2022-06-15 – 2022-06-16 (×2): 1 mg via INTRAVENOUS
  Filled 2022-06-15 (×2): qty 1

## 2022-06-15 MED ORDER — LEVETIRACETAM IN NACL 500 MG/100ML IV SOLN
500.0000 mg | Freq: Two times a day (BID) | INTRAVENOUS | Status: DC
  Administered 2022-06-15 – 2022-06-17 (×4): 500 mg via INTRAVENOUS
  Filled 2022-06-15 (×4): qty 100

## 2022-06-15 MED ORDER — LORAZEPAM 2 MG/ML IJ SOLN
2.0000 mg | Freq: Once | INTRAMUSCULAR | Status: AC
  Administered 2022-06-15: 2 mg via INTRAVENOUS
  Filled 2022-06-15: qty 1

## 2022-06-15 MED ORDER — GLYCOPYRROLATE 1 MG PO TABS: 1.0000 mg | ORAL_TABLET | ORAL | Status: DC | PRN

## 2022-06-15 MED ORDER — LEVETIRACETAM IN NACL 1000 MG/100ML IV SOLN: 1000.0000 mg | Freq: Once | INTRAVENOUS | Status: DC

## 2022-06-15 MED ORDER — METOPROLOL TARTRATE 5 MG/5ML IV SOLN
2.5000 mg | Freq: Three times a day (TID) | INTRAVENOUS | Status: DC
  Administered 2022-06-15 – 2022-06-16 (×3): 2.5 mg via INTRAVENOUS
  Filled 2022-06-15 (×3): qty 5

## 2022-06-15 MED ORDER — ONDANSETRON 4 MG PO TBDP: 4.0000 mg | ORAL_TABLET | Freq: Four times a day (QID) | ORAL | Status: DC | PRN

## 2022-06-15 MED ORDER — ACETAMINOPHEN 325 MG PO TABS: 650.0000 mg | ORAL_TABLET | Freq: Four times a day (QID) | ORAL | Status: DC | PRN

## 2022-06-15 NOTE — TOC Initial Note (Signed)
Transition of Care [facility]) - Initial/Assessment Note    Patient Details  Name: Paula Jimenez MRN: [id number] Date of Birth: 1926-09-04  Transition of [facility]) CM/SW Contact:    Verdell Carmine, RN Phone Number: 06/15/2022, 9:54 AM  Clinical Narrative:                  Patient presented to urgent care a few days ago with flulike symptoms and then yesterday had head pain and fell, losing conciseness. She lives with her daughter and son- in law, and has been ambulating and mentation has been with out deficiency. She has a cane but does not always use it.  Spoke to SIL at bedside regarding DC planning. He would like her to come back home when ready. Unsure what the patient will need or her activity level given that she has had ativan and seizure medications. She has a continuous EEG  PT OT consults to be determined. Recommend Palliative consult for [place] discussion CM will follow for needs, recommendations, and transitions of care   Barriers to Discharge: Continued Medical Work up   Patient Goals and CMS Choice            Expected Discharge Plan and Services   Discharge Planning Services: CM Consult   Living arrangements for the past 2 months: [place]                                      Prior Living Arrangements/Services Living arrangements for the past 2 months: Single Family Home Lives with:: Adult Children Patient language and need for interpreter reviewed:: Yes        Need for Family Participation in Patient Care: Yes (Comment) Care giver support system in place?: Yes (comment) Current home services: DME [facility] Knudsen) Criminal Activity/Legal Involvement Pertinent to Current Situation/Hospitalization: No - Comment as needed  Activities of Daily Living      Permission Sought/Granted                  Emotional Assessment Appearance:: Appears stated age Attitude/Demeanor/Rapport: Lethargic   Orientation: :  (Patient had ativan) Alcohol /  Substance Use: Not Applicable Psych Involvement: No (comment)  Admission diagnosis:  Stroke [facility]) [I63.9] Patient Active Problem List   Diagnosis Date Noted   Stroke ([place]) 06/14/2022   Mitral valve prolapse 06/14/2022   Transportation insecurity 05/26/2022   Underweight due to inadequate caloric intake 05/26/2022   Meningioma ([place]) 04/21/2022   Generalized weakness 04/21/2022   Falls frequently 04/07/2022   Fall at home 04/07/2022   Insect bite of back 09/30/2021   Varicose vein of leg 10/18/2020   Pelvic pressure in female 04/19/2020   Fatigue 05/30/2019   Onychomycosis of toenail 03/02/2019   Urethral caruncle 12/10/2018   Elevated random blood glucose level 08/25/2018   Medicare annual wellness visit, subsequent 08/25/2018   Loose stools 06/28/2018   Internal hemorrhoid, bleeding 04/20/2018   Corn of toe 04/20/2018   Ingrown nail of great toe of right foot 11/05/2017   Frequent UTI 02/16/2015   Epistaxis, recurrent 02/28/2014   History of shingles 02/20/2014   Dizziness 01/06/2014   Hearing loss 09/23/2007   History of melanoma 02/25/2007   HLD (hyperlipidemia) 02/25/2007   MITRAL VALVE PROLAPSE 02/25/2007   Allergic rhinitis 02/25/2007   Asthma, chronic 02/25/2007   DEGENERATIVE DISC DISEASE, LUMBOSACRAL SPINE 02/25/2007   Age related osteoporosis 02/25/2007  Essential hypertension 10/26/2006   PCP:  Abner Greenspan, MD Pharmacy:   [id number] [place] [address] [id number]: [phone]: [phone]    Social Determinants of Health (SDOH) Social History: SDOH Screenings   Food Insecurity: No Food Insecurity (12/18/2021)  Housing: Low Risk  (11/26/2020)  Transportation Needs: No Transportation Needs (12/18/2021)  Alcohol Screen: Low Risk  (11/26/2020)  Depression (PHQ2-9): Low Risk  (12/18/2021)  Financial Resource Strain: Low Risk  (12/18/2021)  Physical Activity: Inactive (12/18/2021)  Stress:  No Stress Concern Present (12/18/2021)  Tobacco Use: Low Risk  (06/14/2022)   SDOH Interventions:     Readmission Risk Interventions     No data to display

## 2022-06-15 NOTE — Progress Notes (Signed)
LTM EEG running - no initial skin breakdown - push button tested - neuro notified.

## 2022-06-15 NOTE — ED Notes (Signed)
Pt pulling at cords, lines, and gown. Not extremely agitated, but restless. Mitts placed on hands for pt safety.

## 2022-06-15 NOTE — Progress Notes (Signed)
SLP Cancellation Note  Patient Details Name: AYDE RECORD MRN: [id number] DOB: 06-24-1926   Cancelled treatment:       Reason Eval/Treat Not Completed: SLP screened, no needs identified, will sign off (Per MD's note 12/24, pt's family has decided on hospice care. SLP will therefore sign off.)  Danzig Macgregor I. Hardin Negus, [place], [place] Office number [phone]  Horton Marshall 06/15/2022, 4:04 PM

## 2022-06-15 NOTE — Progress Notes (Signed)
PROGRESS NOTE    Paula Jimenez  TKW:[id number] DOB: August 25, 1926 DOA: 06/14/2022 PCP: Abner Greenspan, MD     Brief Narrative:  Paula Jimenez is a 88 y.o. WF PMHx   HLD, HTN, mitral valve prolapse, melanoma, osteoporosis, asthma.  She has had cough and congestion over the past week.  She was seen at urgent care 2 days ago for this and underwent chest x-ray.  Yesterday, she complained to family about a "funny feeling" in her head.  This continued today.  While in the bathroom, she had an unwitnessed fall.  Family heard her fall and went to check on her.  She did have a loss of consciousness that lasted for approximately 6 minutes.  At baseline, patient ambulates without walker or cane.  She does not have any dementia or memory deficits at baseline.  EMS reports initial GCS of 5 with concern of posturing.  Responsiveness improved during transit.      Subjective: 12/24 patient somnolent, unresponsive.  Continued seizure.   Assessment & Plan: Covid vaccination;   Principal Problem:   Stroke [facility]) Active Problems:   HLD (hyperlipidemia)   Essential hypertension   Asthma, chronic   Mitral valve prolapse  Stroke - Stroke order set used for admission - Seizure workup per neurology -Blood and urine culture pending - 12/24 stroke has been ruled out by CTA head and neck. - 12/24 patient remains in status epilepticus -12/24 after family spoke with neurologist and he confirmed that patient still having seizures decided on comfort care.  Status Epilepticus -12/24 discussed case with Neurology Dr. Donnetta Simpers, recommends Keppra IM 500 mg BID -12/24 discussed case with pharmacy we do not have that formulation for IM Keppra, therefore we will keep her on Keppra IV 500 mg BID -12/24 if family chooses to take patient home: Home comfort care we will try to continue Keppra 500 mg p.o. BID or discontinue it altogether. - 12/24 neurology recommends Ativan 1- 2 mg IM as needed for clinical  seizure activity -See palliative care consult    Essential HTN/mitral valve prolapse - Elevated allow permissive HTN until stroke ruled out. -Echocardiogram pending   HLD Lipid panel pending       -12/24 Consult Palliative Care: Goals of care and status epilepticus, family indicated to neurology they did not want any further aggressive measures.  Would prefer comfort care.  Discussed case with neurology and he agrees that if she wakes up enough and the family wants home comfort care we should provide it.  Discuss hospital/residential hospice vs home with comfort care    Mobility Assessment (last 72 hours)     Mobility Assessment     Row Name 06/15/22 02:45:14           Does patient have an order for bedrest or is patient medically unstable Yes- Bedfast (Level 1) - Complete                        DVT prophylaxis: COMFORT CARE Code Status: COMFORT CARE Family Communication: 12/24 son at bedside discussed plan of care all questions answered Status is: Inpatient    Dispo: The patient is from: Home              Anticipated d/c is to: Home??              Anticipated d/c date is: 3 days              Patient currently  is not medically stable to d/c.      Consultants:  Neurology  Procedures/Significant Events:  EEG CTA head and neck  I have personally reviewed and interpreted all radiology studies and my findings are as above.  VENTILATOR SETTINGS:    Cultures   Antimicrobials:    Devices    LINES / TUBES:      Continuous Infusions:  sodium chloride 50 mL/hr at 06/15/22 0955     Objective: Vitals:   06/15/22 0445 06/15/22 0500 06/15/22 0835 06/15/22 0842  BP: (!) 171/90 (!) 149/100 (!) 172/90   Pulse: 76 78 83   Resp: (!) 30 (!) 28 17   Temp:    97.8 F (36.6 C)  TempSrc:    Oral  SpO2: 96% 96% 96%   Weight:      Height:       No intake or output data in the 24 hours ending 06/15/22 1057 Filed Weights   06/14/22 1400   Weight: 44.6 kg    Examination:  General: Somnolent (in status epilepticus), No acute respiratory distress Eyes: negative scleral hemorrhage, negative anisocoria, negative icterus ENT: Negative Runny nose, negative gingival bleeding, Neck:  Negative scars, masses, torticollis, lymphadenopathy, JVD Lungs: Clear to auscultation bilaterally without wheezes or crackles Cardiovascular: Regular rate and rhythm without murmur gallop or rub normal S1 and S2 Abdomen: negative abdominal pain, nondistended, positive soft, bowel sounds, no rebound, no ascites, no appreciable mass Extremities: No significant cyanosis, clubbing, or edema bilateral lower extremities Skin: Negative rashes, lesions, ulcers Psychiatric:  Somnolent (in status epilepticus), Central nervous system:  Somnolent (in status epilepticus), .     Data Reviewed: Care during the described time interval was provided by me .  I have reviewed this patient's available data, including medical history, events of note, physical examination, and all test results as part of my evaluation.  CBC: Recent Labs  Lab 06/14/22 1425 06/14/22 1431 06/14/22 2045  WBC 8.5  --  8.8  NEUTROABS 4.6  --   --   HGB 13.7 13.9 14.2  HCT 40.6 41.0 40.7  MCV 91.0  --  87.9  PLT 241  --  626   Basic Metabolic Panel: Recent Labs  Lab 06/14/22 1425 06/14/22 1431 06/14/22 2045  NA 140 135  --   K 3.1* 3.0*  --   CL 95* 96*  --   CO2 22  --   --   GLUCOSE 133* 130*  --   BUN 13 13  --   CREATININE 0.64 0.50 0.48  CALCIUM 7.7*  --   --    GFR: Estimated Creatinine Clearance: 30.9 mL/min (by C-G formula based on SCr of 0.48 mg/dL). Liver Function Tests: Recent Labs  Lab 06/14/22 1425  AST 26  ALT 17  ALKPHOS 61  BILITOT 0.2*  PROT 5.9*  ALBUMIN 3.1*   No results for input(s): "LIPASE", "AMYLASE" in the last 168 hours. No results for input(s): "AMMONIA" in the last 168 hours. Coagulation Profile: Recent Labs  Lab 06/14/22 1425   INR 1.1   Cardiac Enzymes: No results for input(s): "CKTOTAL", "CKMB", "CKMBINDEX", "TROPONINI" in the last 168 hours. BNP (last 3 results) No results for input(s): "PROBNP" in the last 8760 hours. HbA1C: No results for input(s): "HGBA1C" in the last 72 hours. CBG: No results for input(s): "GLUCAP" in the last 168 hours. Lipid Profile: Recent Labs    06/14/22 2045  CHOL 157  HDL 45  LDLCALC 97  TRIG 77  CHOLHDL 3.5  Thyroid Function Tests: No results for input(s): "TSH", "T4TOTAL", "FREET4", "T3FREE", "THYROIDAB" in the last 72 hours. Anemia Panel: No results for input(s): "VITAMINB12", "FOLATE", "FERRITIN", "TIBC", "IRON", "RETICCTPCT" in the last 72 hours. Sepsis Labs: Recent Labs  Lab 06/14/22 1531 06/14/22 2045  LATICACIDVEN 2.3* 1.0    Recent Results (from the past 240 hour(s))  Culture, blood (Routine X 2) w Reflex to ID Panel     Status: None (Preliminary result)   Collection Time: 06/14/22  8:45 PM   Specimen: BLOOD  Result Value Ref Range Status   Specimen Description BLOOD SITE NOT SPECIFIED  Final   Special Requests   Final    BOTTLES DRAWN AEROBIC AND ANAEROBIC Blood Culture results may not be optimal due to an excessive volume of blood received in culture bottles   Culture   Final    NO GROWTH < 12 HOURS Performed at [facility], [place] [address] [place], [place] [address]    Report Status PENDING  Incomplete         Radiology Studies: Overnight EEG with video  Result Date: 06/15/2022 Lora Havens, MD     06/15/2022  5:57 AM Patient Name: Paula Jimenez MRN: [id number] Epilepsy Attending: Lora Havens Referring Physician/Provider: Derek Jack, MD Duration: 06/14/2022 1547 to 06/15/2022 0600 Patient history: 88 y.o. female with a history of altered mental status who is undergoing an EEG to evaluate for seizures. Level of alertness:  lethargic AEDs during EEG study: None Technical aspects: This EEG study was done with scalp  electrodes positioned according to the 10-20 International system of electrode placement. Electrical activity was reviewed with band pass filter of 1-'70Hz'$ , sensitivity of 7 uV/mm, display speed of 75m/sec with a '60Hz'$  notched filter applied as appropriate. EEG data were recorded continuously and digitally stored.  Video monitoring was available and reviewed as appropriate. Description: EEG showed continuous generalized 3 to 6 Hz theta-delta slowing. Generalized periodic discharges at 1.5-'3hz'$  were also noted consistent with electrographic status epilepticus.  Hyperventilation and photic stimulation were not performed.   ABNORMALITY - Electrographic status epilepticus, generalized - Continuous slow, generalized IMPRESSION: This study showed evidence of electrographic status epilepticus with generalized onset. Additionally there is moderate to severe diffuse encephalopathy Dr. KLeonel Ramsaywas notified. PLora Havens  EEG adult  Result Date: 06/14/2022 SDerek Jack MD     06/14/2022  3:53 PM Routine EEG Report Paula RUSSELLis a 88y.o. female with a history of altered mental status who is undergoing an EEG to evaluate for seizures. Report: This EEG was acquired with electrodes placed according to the International 10-20 electrode system (including Fp1, Fp2, F3, F4, C3, C4, P3, P4, O1, O2, T3, T4, T5, T6, A1, A2, Fz, Cz, Pz). The following electrodes were missing or displaced: none. The occipital dominant rhythm was 7 Hz with intermittent superimposed further diffuse slowing. This activity is reactive to stimulation. Drowsiness was manifested by background fragmentation; deeper stages of sleep were not identified. There was no focal slowing. There were frequent runs q 3-4 seconds of generalized periodic discharges up to 2-3 Hz lasting 3 seconds or less. There were no electrographic seizures identified. Photic stimulation and hyperventilation were not performed. Impression and clinical correlation: This  EEG was obtained while awake and drowsy and is abnormal due to: - Mild-to-moderate diffuse slowing indicative of global cerebral dysfunction - Frequent Brief Potentially Ictal Rhythmic Discharges (BIRDs) lasting 3 seconds or less indicating increased epileptic potential No definitive electrographic  seizures were observed. Recommend prolonged EEG. Su Monks, MD [facility] [phone] If 7pm- 7am, please page neurology on call as listed in [place].   CT CERVICAL SPINE WO CONTRAST  Result Date: 06/14/2022 CLINICAL DATA:  Neck trauma EXAM: CT Cervical Spine with contrast TECHNIQUE: Multiplanar CT images of the cervical spine were reconstructed from contemporary CTA of the Neck. RADIATION DOSE REDUCTION: This exam was performed according to the departmental dose-optimization program which includes automated exposure control, adjustment of the mA and/or kV according to patient size and/or use of iterative reconstruction technique. COMPARISON:  None Available. FINDINGS: Alignment: Normal. There is no antero or retrolisthesis. There is no jumped or perched facet or other evidence of traumatic malalignment. Skull base and vertebrae: Skull base alignment is maintained. Vertebral body heights are preserved. There is no acute fracture. There is no suspicious osseous lesion. There is fusion of the posterior elements from C3 through C5 on the left and at C4-C5 on the right. Soft tissues and spinal canal: No prevertebral fluid or swelling. No visible canal hematoma. Disc levels: There is overall mild for age degenerative change in the cervical spine with mild multilevel facet arthropathy, and disc space narrowing degenerative endplate change most advanced at C5-C6 and C6-C7. There is no evidence of high-grade osseous spinal canal or neural foraminal stenosis. Upper chest: There is scarring in the lung apices. Other: None. IMPRESSION: No acute fracture or traumatic malalignment of the cervical spine.  Electronically Signed   By: Valetta Mole M.D.   On: 06/14/2022 15:25   CT HEAD CODE STROKE WO CONTRAST  Result Date: 06/14/2022 CLINICAL DATA:  Code stroke.  Neck trauma EXAM: CT ANGIOGRAPHY HEAD AND NECK TECHNIQUE: Multidetector CT imaging of the head and neck was performed using the standard protocol during bolus administration of intravenous contrast. Multiplanar CT image reconstructions and MIPs were obtained to evaluate the vascular anatomy. Carotid stenosis measurements (when applicable) are obtained utilizing NASCET criteria, using the distal internal carotid diameter as the denominator. RADIATION DOSE REDUCTION: This exam was performed according to the departmental dose-optimization program which includes automated exposure control, adjustment of the mA and/or kV according to patient size and/or use of iterative reconstruction technique. CONTRAST:  35m OMNIPAQUE IOHEXOL 350 MG/ML SOLN COMPARISON:  None Available. FINDINGS: CT HEAD FINDINGS Brain: There is no acute intracranial hemorrhage, extra-axial fluid collection, or acute infarct Parenchymal volume is normal for age. The ventricles are normal in size. Gray-white differentiation is preserved. There is confluent hypodensity throughout the supratentorial white matter likely reflecting advanced background chronic small-vessel ischemic change. There are probable remote infarcts in the bilateral thalami. There is a partially calcified extra-axial lesion overlying the high right frontal lobe at the vertex measuring 1.3 cm x 1.6 cm consistent with a meningioma. There is no significant mass effect on the underlying brain parenchyma or midline shift. Vascular: No hyperdense vessel or unexpected calcification. There is calcification of the bilateral carotid siphons and vertebral arteries. Skull: Normal. Negative for fracture or focal lesion. Sinuses/Orbits: There is layering fluid in the bilateral maxillary sinuses. Bilateral lens implants are in place. The  globes and orbits are otherwise unremarkable. Other: None. CTA NECK FINDINGS Aortic arch: There is calcified plaque in the imaged aortic arch. The origins of the major branch vessels are patent. The subclavian arteries are patent to the level imaged. Right carotid system: The right common, internal, and external carotid arteries are patent, with mild plaque of the bifurcation but no hemodynamically significant stenosis or occlusion. There is no  dissection or aneurysm. Left carotid system: The left common, internal, and external carotid arteries are patent, with mild plaque at the bifurcation but no hemodynamically significant stenosis or occlusion. There is no dissection or aneurysm. Vertebral arteries: The vertebral arteries are patent, without hemodynamically significant stenosis or occlusion. There is no dissection or aneurysm. Skeleton: There is no acute osseous abnormality or suspicious osseous lesion. There is no visible canal hematoma. Other neck: There is a 9 mm right thyroid nodule requiring no specific imaging follow-up. Upper chest: There is biapical scarring. Review of the MIP images confirms the above findings CTA HEAD FINDINGS Anterior circulation: There is mild calcified plaque in the carotid siphons but no hemodynamically significant stenosis or occlusion. The bilateral MCAs are patent, without proximal stenosis or occlusion. The bilateral ACAs are patent, without proximal stenosis or occlusion. There is no aneurysm or AVM. Posterior circulation: The bilateral V4 segments are patent. The basilar artery is patent. The major cerebellar arteries appear patent. The bilateral PCAs are patent, without proximal stenosis or occlusion. A right posterior communicating artery is identified. There is no aneurysm or AVM. Venous sinuses: Not well assessed due to bolus timing. Anatomic variants: None. Review of the MIP images confirms the above findings IMPRESSION: 1. No acute intracranial pathology. 2. Patent  vasculature of the head and neck with no hemodynamically significant stenosis or occlusion. 3. Advanced background chronic small-vessel ischemic change. 4. 1.6 cm meningioma overlying the high right frontal lobe. 5. Layering fluid in the maxillary sinuses could reflect acute sinusitis in the correct clinical setting. Findings from the noncontrast head CT were communicated to Dr. Erlinda Hong via amion at 2:38 pm. CTA findings were communicated at 2:52 p.m. Electronically Signed   By: Valetta Mole M.D.   On: 06/14/2022 15:21   CT ANGIO HEAD NECK W WO CM  Result Date: 06/14/2022 CLINICAL DATA:  Code stroke.  Neck trauma EXAM: CT ANGIOGRAPHY HEAD AND NECK TECHNIQUE: Multidetector CT imaging of the head and neck was performed using the standard protocol during bolus administration of intravenous contrast. Multiplanar CT image reconstructions and MIPs were obtained to evaluate the vascular anatomy. Carotid stenosis measurements (when applicable) are obtained utilizing NASCET criteria, using the distal internal carotid diameter as the denominator. RADIATION DOSE REDUCTION: This exam was performed according to the departmental dose-optimization program which includes automated exposure control, adjustment of the mA and/or kV according to patient size and/or use of iterative reconstruction technique. CONTRAST:  36m OMNIPAQUE IOHEXOL 350 MG/ML SOLN COMPARISON:  None Available. FINDINGS: CT HEAD FINDINGS Brain: There is no acute intracranial hemorrhage, extra-axial fluid collection, or acute infarct Parenchymal volume is normal for age. The ventricles are normal in size. Gray-white differentiation is preserved. There is confluent hypodensity throughout the supratentorial white matter likely reflecting advanced background chronic small-vessel ischemic change. There are probable remote infarcts in the bilateral thalami. There is a partially calcified extra-axial lesion overlying the high right frontal lobe at the vertex measuring  1.3 cm x 1.6 cm consistent with a meningioma. There is no significant mass effect on the underlying brain parenchyma or midline shift. Vascular: No hyperdense vessel or unexpected calcification. There is calcification of the bilateral carotid siphons and vertebral arteries. Skull: Normal. Negative for fracture or focal lesion. Sinuses/Orbits: There is layering fluid in the bilateral maxillary sinuses. Bilateral lens implants are in place. The globes and orbits are otherwise unremarkable. Other: None. CTA NECK FINDINGS Aortic arch: There is calcified plaque in the imaged aortic arch. The origins of the major branch  vessels are patent. The subclavian arteries are patent to the level imaged. Right carotid system: The right common, internal, and external carotid arteries are patent, with mild plaque of the bifurcation but no hemodynamically significant stenosis or occlusion. There is no dissection or aneurysm. Left carotid system: The left common, internal, and external carotid arteries are patent, with mild plaque at the bifurcation but no hemodynamically significant stenosis or occlusion. There is no dissection or aneurysm. Vertebral arteries: The vertebral arteries are patent, without hemodynamically significant stenosis or occlusion. There is no dissection or aneurysm. Skeleton: There is no acute osseous abnormality or suspicious osseous lesion. There is no visible canal hematoma. Other neck: There is a 9 mm right thyroid nodule requiring no specific imaging follow-up. Upper chest: There is biapical scarring. Review of the MIP images confirms the above findings CTA HEAD FINDINGS Anterior circulation: There is mild calcified plaque in the carotid siphons but no hemodynamically significant stenosis or occlusion. The bilateral MCAs are patent, without proximal stenosis or occlusion. The bilateral ACAs are patent, without proximal stenosis or occlusion. There is no aneurysm or AVM. Posterior circulation: The bilateral  V4 segments are patent. The basilar artery is patent. The major cerebellar arteries appear patent. The bilateral PCAs are patent, without proximal stenosis or occlusion. A right posterior communicating artery is identified. There is no aneurysm or AVM. Venous sinuses: Not well assessed due to bolus timing. Anatomic variants: None. Review of the MIP images confirms the above findings IMPRESSION: 1. No acute intracranial pathology. 2. Patent vasculature of the head and neck with no hemodynamically significant stenosis or occlusion. 3. Advanced background chronic small-vessel ischemic change. 4. 1.6 cm meningioma overlying the high right frontal lobe. 5. Layering fluid in the maxillary sinuses could reflect acute sinusitis in the correct clinical setting. Findings from the noncontrast head CT were communicated to Dr. Erlinda Hong via amion at 2:38 pm. CTA findings were communicated at 2:52 p.m. Electronically Signed   By: Valetta Mole M.D.   On: 06/14/2022 15:21        Scheduled Meds:   stroke: early stages of recovery book   Does not apply Once   enoxaparin (LOVENOX) injection  30 mg Subcutaneous Q24H   Continuous Infusions:  sodium chloride 50 mL/hr at 06/15/22 0955     LOS: 0 days    Time spent:40 min    Aashrith Eves, Geraldo Docker, MD [facility]   If 7PM-7AM, please contact night-coverage 06/15/2022, 10:57 AM

## 2022-06-15 NOTE — Progress Notes (Signed)
RN called and requested help to transfer to another room in ED. PT transfered

## 2022-06-15 NOTE — Procedures (Addendum)
Patient Name: Paula Jimenez  MRN: [id number]  Epilepsy Attending: Charlsie Quest  Referring Physician/Provider: Jefferson Fuel, MD  Duration: 06/14/2022 1547 to 06/15/2022 1100  Patient history: 88 y.o. female with a history of altered mental status who is undergoing an EEG to evaluate for seizures.   Level of alertness:  lethargic   AEDs during EEG study: None  Technical aspects: This EEG study was done with scalp electrodes positioned according to the 10-20 International system of electrode placement. Electrical activity was reviewed with band pass filter of 1-70Hz , sensitivity of 7 uV/mm, display speed of 51mm/sec with a 60Hz  notched filter applied as appropriate. EEG data were recorded continuously and digitally stored.  Video monitoring was available and reviewed as appropriate.  Description: EEG showed continuous generalized 3 to 6 Hz theta-delta slowing. Generalized periodic discharges at 1.5-3hz  were also noted consistent with electrographic status epilepticus.  Hyperventilation and photic stimulation were not performed.     ABNORMALITY - Electrographic status epilepticus, generalized - Continuous slow, generalized  IMPRESSION: This study showed evidence of electrographic status epilepticus with generalized onset. Additionally there is moderate to severe diffuse encephalopathy  Dr. Amada Jupiter was notified.  Paula Jimenez

## 2022-06-15 NOTE — Care Management Obs Status (Signed)
[facility] NOTIFICATION   Patient Details  Name: Paula Jimenez MRN: [id number] Date of Birth: 10-30-1926   Medicare Observation Status Notification Given:  Yes   Verbal permission to sign by Mr Jodell Cipro ( SIL) at bedside patient is unable to sign Verdell Carmine, RN 06/15/2022, 9:52 AM

## 2022-06-15 NOTE — Progress Notes (Signed)
NEUROLOGY CONSULTATION PROGRESS NOTE   Date of service: June 15, 2022 Patient Name: Paula Jimenez MRN:  [id number] DOB:  10/06/1926  Brief HPI  Paula Jimenez is a 88 year old female with history of hypertension, hyperlipidemia, recent diagnosis of meningioma presented to ED for fall at home with altered mental status, posturing. Mental status gradually improving, concerning for right side decreased sensation. But then also concerning for left arm drift. ER patient GCS improved to 14, gradually more awake alert, however still disorientated and with perseveration and repeating questions but no focal deficit. CT no acute abnormality except known meningioma. CT head and neck negative. Stat EEG concerning for birds, put on long-term EEG.    Interval Hx   LTM EEG concerning for status. Given Ativan '2mg'$  and loaded with Keppra '2500mg'$  once.  Following Ativan and Keppra, EEG briefly improved but again concerning for Status.  Patient barely protecting her airway.  I discussed with family further options for treatment would very likely cause her to lose her airway and the need to intubate her and put her on a vent.  I spoke with daughter over phone with son in law present at the bedside.  Patient had clearly expressed wishes to not be put on a ventilator.  At this time, family has opted for full comfort care.  Recs: - Recommend Keppra '500mg'$  PO BID if she can tolerate PO - We can also use Ativan 1-'2mg'$  IM as needed for clinical seizure activity. - we will take her off LTM EEG.  Plan discussed with Dr. Sherral Hammers. We will signoff.  [place] Pager Number [phone]

## 2022-06-15 NOTE — Progress Notes (Signed)
PT Cancellation Note  Patient Details Name: Paula Jimenez MRN: [id number] DOB: 1926/11/20   Cancelled Treatment:    Reason Eval/Treat Not Completed: Other (comment). Pt planning to go full comfort care. PT will sign off.   Moishe Spice, PT, DPT Acute Rehabilitation Services  Office: [place] 06/15/2022, 11:42 AM

## 2022-06-15 NOTE — Progress Notes (Signed)
Echocardiogram cancelled per Doctor's request

## 2022-06-15 NOTE — Progress Notes (Signed)
OT NOTE  Occupational Therapy Discharge Patient Details Name: Paula Jimenez MRN: [id number] DOB: October 17, 1926 Today's Date: 06/15/2022 Time:  -     Patient discharged from OT services secondary to medical decline - will need to re-order OT to resume therapy services.  Please see latest therapy progress note for current level of functioning and progress toward goals.    Progress and discharge plan discussed with patient and/or caregiver: Patient/Caregiver agrees with plan  GO     Jeri Modena 06/15/2022, 11:44 AM

## 2022-06-16 DIAGNOSIS — Z66 Do not resuscitate: Secondary | ICD-10-CM | POA: Diagnosis not present

## 2022-06-16 DIAGNOSIS — Z7189 Other specified counseling: Secondary | ICD-10-CM | POA: Diagnosis not present

## 2022-06-16 DIAGNOSIS — J4520 Mild intermittent asthma, uncomplicated: Secondary | ICD-10-CM | POA: Diagnosis not present

## 2022-06-16 DIAGNOSIS — I341 Nonrheumatic mitral (valve) prolapse: Secondary | ICD-10-CM | POA: Diagnosis not present

## 2022-06-16 DIAGNOSIS — E7800 Pure hypercholesterolemia, unspecified: Secondary | ICD-10-CM | POA: Diagnosis not present

## 2022-06-16 DIAGNOSIS — Z515 Encounter for palliative care: Secondary | ICD-10-CM

## 2022-06-16 DIAGNOSIS — I10 Essential (primary) hypertension: Secondary | ICD-10-CM | POA: Diagnosis not present

## 2022-06-16 DIAGNOSIS — W19XXXA Unspecified fall, initial encounter: Secondary | ICD-10-CM | POA: Diagnosis not present

## 2022-06-16 LAB — COMPREHENSIVE METABOLIC PANEL
ALT: 16 U/L (ref 0–44)
AST: 20 U/L (ref 15–41)
Albumin: 2.8 g/dL — ABNORMAL LOW (ref 3.5–5.0)
Alkaline Phosphatase: 61 U/L (ref 38–126)
Anion gap: 15 (ref 5–15)
BUN: 9 mg/dL (ref 8–23)
CO2: 23 mmol/L (ref 22–32)
Calcium: 8.3 mg/dL — ABNORMAL LOW (ref 8.9–10.3)
Chloride: 91 mmol/L — ABNORMAL LOW (ref 98–111)
Creatinine, Ser: 0.55 mg/dL (ref 0.44–1.00)
GFR, Estimated: >60 mL/min (ref >60)
Glucose, Bld: 81 mg/dL (ref 70–99)
Potassium: 3.1 mmol/L — ABNORMAL LOW (ref 3.5–5.1)
Sodium: 129 mmol/L — ABNORMAL LOW (ref 135–145)
Total Bilirubin: 1.5 mg/dL — ABNORMAL HIGH (ref 0.3–1.2)
Total Protein: 5.7 g/dL — ABNORMAL LOW (ref 6.5–8.1)

## 2022-06-16 MED ORDER — HYDRALAZINE HCL 20 MG/ML IJ SOLN: 5.0000 mg | Freq: Four times a day (QID) | INTRAMUSCULAR | Status: DC | PRN

## 2022-06-16 MED ORDER — METOPROLOL TARTRATE 5 MG/5ML IV SOLN
5.0000 mg | Freq: Three times a day (TID) | INTRAVENOUS | Status: DC
  Administered 2022-06-16 – 2022-06-18 (×6): 5 mg via INTRAVENOUS
  Filled 2022-06-16 (×6): qty 5

## 2022-06-16 NOTE — Consult Note (Signed)
Palliative Care Consult Note                                  Date: 06/16/2022   Patient Name: Paula Jimenez  DOB: 14-Nov-1926  MRN: [id number]  Age / Sex: 88 y.o., female  PCP: Tower, Wynelle Fanny, MD Referring Physician: Allie Bossier, MD  Reason for Consultation: Establishing goals of care  HPI/Patient Profile: 88 y.o. female  with past medical history of  HLD, HTN, mitral valve prolapse, melanoma, osteoporosis, asthma.  Yesterday, she complained to family about a "funny feeling" in her head and while in the bathroom, she had an unwitnessed fall with a loss of consciousness that lasted for approximately 6 minutes.  She was admitted on 06/14/2022 with possible stroke, status epilepticus, and others.   After she was seen by neurology and hospitalist service family elected comfort care due to persistent status epilepticus.  PMT was consulted for [place] conversations and comfort care.  Past Medical History:  Diagnosis Date   Allergy    Asthma    years ago, 2-3 times a year pt experiences a problem with breathing.   Bursitis, hip    Family history of adverse reaction to anesthesia    DAUGHTER had a reaction to ansethesia   Hyperlipidemia    Hypertension    HYPERTENSION, BENIGN 10/26/2006   Qualifier: Diagnosis of  By: Selinda Orion     MITRAL VALVE PROLAPSE 02/25/2007   Qualifier: History of  By: Marcelino Scot CMA, Margot Chimes 08/01   Osteoporosis    Skin cancer 2015   nose    Subjective:   This NP Walden Field reviewed medical records, received report from team, assessed the patient and then meet at the patient's bedside to discuss diagnosis, prognosis, GOC, EOL wishes disposition and options.  I met with the patient at the bedside.  Also present was her daughter Bethena Roys and her son Delfino Lovett.  The patient appears awake, alert, appropriately answering questions and following   Concept of Palliative Care was introduced as specialized  medical care for people and their families living with serious illness.  If focuses on providing relief from the symptoms and stress of a serious illness.  The goal is to improve quality of life for both the patient and the family. Values and goals of care important to patient and family were attempted to be elicited.  Created space and opportunity for patient  and family to explore thoughts and feelings regarding current medical situation   Natural trajectory and current clinical status were discussed. Questions and concerns addressed. Patient  encouraged to call with questions or concerns.    Patient/Family Understanding of Illness: I understand she had a seizure.  Initially thought she might of had a stroke but this was ruled out.  He stated on Saturday her head was not feeling right and she was having some difficulty talking.  She went to the bathroom where she fell from the toilet into the bathtub and hit her head.  He states that she was "out" for about 6 minutes and having a seizure at that time.  We had further discussion on her chronic comorbidities and how those impact her acute presentations.  We discussed in more detail the findings of the hospitalist team and neurology.  Life Review: The patient has 4 children live somewhat locally.  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  Goals: She seems to be improving on Keppra goals are hopefully no seizures for the next 36 hours to be able to discharge home with outpatient follow-up.  Today's Discussion: In addition to discussions described above we had extensive and substantial discussions on various topics.  During our discussion the patient's daughter stepped out to speak with the hospitalist on the phone.  She states that when the patient first came in she was unresponsive and neurology said she was having seizures and they recommended comfort care given the likelihood of prolonged seizures and her age.   Then apparently last night she "woke up" and was more interactive.  In fact in the middle the night she called her daughter and talk to her.  They feel that the Keppra is helping.   The patient is pleasantly interactive and is able to tell me her name, how many children she has, her children's names, and that she was "out for a while but woke up last night.  We discussed the current situation further.  We celebrated that the patient is woke up and is able to interact and spend time with her family, especially on Christmas Day.  However, I cautioned that this could be ongoing or it could be more temporary and at any point she could relapse with more seizures and unresponsiveness.  Her family understands this.  We discussed plan moving forward.  Given that she has improved on the [place] they would like to rescind comfort care and try a medical management based approach.  They would like her to continue to receive medications, Keppra p.o. if she is cleared to swallow.  They would like an evaluation of her swallowing to see if she can eat.  However, they agree to continue her DNR status.  We also discussed, given her neurological issues, if a feeding tube was required wearing the patient was not able to eat or drink would they be in agreement with this.  After substantial discussion they indicated they would except a temporary feeding tube for time-limited trial that it was a temporary incredible situation.  However, they do not seem interested in prolonged feeding tube such as a PEG tube.  We discussed allowing time for outcomes.  We also discussed involving palliative care as an outpatient given her new seizure diagnosis and her age.  They are in agreement with this.  I provided emotional and general support through therapeutic listening, empathy, sharing of stories, and other techniques. I answered all questions and addressed all concerns to the best of my ability.  Review of Systems  Constitutional:   Positive for fatigue.       Denies pain in general  Respiratory:  Negative for cough and shortness of breath.   Gastrointestinal:  Negative for abdominal pain, nausea and vomiting.    Objective:   Primary Diagnoses: Present on Admission:  Stroke ([place])  HLD (hyperlipidemia)  Essential hypertension  Mitral valve prolapse  Asthma, chronic   Physical Exam Vitals and nursing note reviewed.  Constitutional:      General: She is sleeping. She is not in acute distress.    Appearance: She is ill-appearing. She is not toxic-appearing.     Comments: Intermittently falling asleep, awakens easily  HENT:     Head: Normocephalic and atraumatic.  Cardiovascular:     Rate and Rhythm: Normal rate.  Pulmonary:     Effort: Pulmonary effort is normal. No respiratory distress.  Abdominal:     General: Abdomen is flat.  Palpations: Abdomen is soft.  Skin:    General: Skin is warm and dry.  Neurological:     General: No focal deficit present.     Mental Status: She is easily aroused.  Psychiatric:        Mood and Affect: Mood normal.        Behavior: Behavior normal.     Vital Signs:  BP (!) 189/90 (BP Location: Left Arm)   Pulse 82   Temp 97.7 F (36.5 C) (Oral)   Resp 16   Ht _0  (1.626 m)   Wt 44.6 kg   SpO2 97%   BMI 16.88 kg/m   Palliative Assessment/Data: 10% (NPO for now)    Advanced Care Planning:   Existing Vynca/ACP Documentation: [place] document (completed today 06/16/22)  Primary Decision Maker: NEXT OF KIN  Code Status/Advance Care Planning: DNR  A discussion was had today regarding advanced directives. Concepts specific to code status, artifical feeding and hydration, continued IV antibiotics and rehospitalization was had.  The difference between a aggressive medical intervention path and a palliative comfort care path for this patient at this time was had.   Decisions/Changes to ACP: Remain DNR Accepting of temporary feeding tube (time limited trial)  if needed No permeant feeding tube (ie- PEG) OP Palliative care ongoing GOC  Assessment & Plan:   Impression: 88 year old female with chronic comorbidities and acute presentation as described above.  In short, she presented after an unwitnessed fall with loss of consciousness.  No brain bleed or ischemic CVA.  Noted seizures and remained in status epilepticus for some time prompting family to make her comfort care.  However, last night she woke up and became more interactive, more alert.  At this point they would like to continue medical management, specifically Keppra.  Remain DNR, temporary feeding tube if needed, no permanent feeding tube.  Discussed with hospitalist who plans to get SLP reinvolved for swallow eval.  Hopefully home in the next 2 to 3 days if she remains seizure-free.  Overall long-term prognosis guarded to poor.  SUMMARY OF RECOMMENDATIONS   Remain DNR If needed, okay for temporary feeding tube/time-limited trial Continue medical management of seizures TOC consult for outpatient palliative care Time for outcomes PMT will follow-up in 2 days Please contact us if any significant clinical change or new palliative needs before then  Symptom Management:  Per primary team PMT is available to assist as needed  Prognosis:  Unable to determine  Discharge Planning:  To Be Determined   Discussed with: Patient, family, medical team, nursing team, [facility] team    Thank you for allowing [place] to participate in the care of Paula Jimenez PMT will continue to support holistically.  Time Total: 80 min  Greater than 50%  of this time was spent counseling and coordinating care related to the above assessment and plan.  Signed by: Walden Field, NP Palliative Medicine Team  Team Phone # [phone] (Nights/Weekends)  06/16/2022, 10:20 AM

## 2022-06-16 NOTE — Progress Notes (Signed)
PROGRESS NOTE    Paula Jimenez  QQI:[id number] DOB: [birth year] DOA: 06/14/2022 PCP: Abner Greenspan, MD     Brief Narrative:  Paula Jimenez is a 88 y.o. WF PMHx   HLD, HTN, mitral valve prolapse, melanoma, osteoporosis, asthma.  She has had cough and congestion over the past week.  She was seen at urgent care 2 days ago for this and underwent chest x-ray.  Yesterday, she complained to family about a "funny feeling" in her head.  This continued today.  While in the bathroom, she had an unwitnessed fall.  Family heard her fall and went to check on her.  She did have a loss of consciousness that lasted for approximately 6 minutes.  At baseline, patient ambulates without walker or cane.  She does not have any dementia or memory deficits at baseline.  EMS reports initial GCS of 5 with concern of posturing.  Responsiveness improved during transit.      Subjective: 2023-06-28 A/O x 4, interacting with all of her family Appropriately.  Does not remember exactly what happened prior to hospitalization.  Family verifies that she is at baseline.  Patient states she does not drive.  Patient also describes some episodes as a child which may have been seizures (undiagnosed).  Stated she outgrew these episodes.  States has difficulty swallowing large tablets and has had difficulty for some time.   Assessment & Plan: Covid vaccination;   Principal Problem:   Stroke [facility]) Active Problems:   HLD (hyperlipidemia)   Essential hypertension   Asthma, chronic   Mitral valve prolapse  Stroke - Stroke order set used for admission - Seizure workup per neurology -Blood and urine culture pending - 12/24 stroke has been ruled out by CTA head and neck. - 12/24 patient remains in status epilepticus -12/24 after family spoke with neurologist and he confirmed that patient still having seizures decided on comfort care. -Jun 28, 2023 see status epilepticus  Status Epilepticus -12/24 discussed case with Neurology Dr.  Donnetta Simpers, recommends Keppra IM 500 mg BID -12/24 discussed case with pharmacy we do not have that formulation for IM Keppra, therefore we will keep her on Keppra IV 500 mg BID -12/24 if family chooses to take patient home: Home comfort care we will try to continue Keppra 500 mg p.o. BID or discontinue it altogether. - 12/24 neurology recommends Ativan 1- 2 mg IM as needed for clinical seizure activity -See palliative care consult -06/28/2023 spoke with family overnight status epilepticus resolved.  Would like to continue trying medication. Jun 28, 2023 resend comfort care - 06/28/23 convert to Keppra 500 mg PO BID (solution ) once patient has passed swallow study 06/28/23 PT/OT consult Evaluate for patient [facility] vs SNF  -06/28/23 discussed case with Dr. Rory Percy neurology concurs follow-up in 3 months   Essential HTN/mitral valve prolapse - Elevated allow permissive HTN until stroke ruled out. 06/28/2023 now that patient no longer comfort care will need to normalize BP. 28-Jun-2023 Metoprolol IV 5 mg TID 06/28/2023 Hydralazine PRN -Echocardiogram no significant see results below   HLD -12/23 lipid panel: LDL= 97       -12/24 Consult Palliative Care: Goals of care and status epilepticus, family indicated to neurology they did not want any further aggressive measures.  Would prefer comfort care.  Discussed case with neurology and he agrees that if she wakes up enough and the family wants home comfort care we should provide it.  Discuss hospital/residential hospice vs home with comfort care    Mobility  Assessment (last 72 hours)     Mobility Assessment     Row Name 06/15/22 1940 06/15/22 02:45:14         Does patient have an order for bedrest or is patient medically unstable Yes- Bedfast (Level 1) - Complete Yes- Bedfast (Level 1) - Complete                       DVT prophylaxis: COMFORT CARE Code Status: COMFORT CARE Family Communication: 12/24 son at bedside discussed plan of care all questions  answered Status is: Inpatient    Dispo: The patient is from: Home              Anticipated d/c is to: Home??              Anticipated d/c date is: 3 days              Patient currently is not medically stable to d/c.      Consultants:  Neurology   Procedures/Significant Events:  EEG CTA head and neck 12/24 Echocardiogram Left Ventricle: Left ventricular ejection fraction, by estimation, is 65  to 70%. The left ventricle has normal function.  moderate left ventricular hypertrophy. Left ventricular diastolic  function could not be evaluated.     I have personally reviewed and interpreted all radiology studies and my findings are as above.  VENTILATOR SETTINGS:    Cultures   Antimicrobials:    Devices    LINES / TUBES:      Continuous Infusions:  sodium chloride 50 mL/hr at 06/15/22 0955   levETIRAcetam 500 mg (06/15/22 1954)     Objective: Vitals:   06/15/22 2030 06/15/22 2100 06/16/22 0617 06/16/22 0727  BP: (!) 160/95  (!) 172/100 (!) 189/90  Pulse:   89 82  Resp:  '13 19 16  '$ Temp:    97.7 F (36.5 C)  TempSrc:    Oral  SpO2:    97%  Weight:      Height:        Intake/Output Summary (Last 24 hours) at 06/16/2022 0900 Last data filed at 06/16/2022 0347 Gross per 24 hour  Intake 1545.08 ml  Output --  Net 1545.08 ml   Filed Weights   06/14/22 1400  Weight: 44.6 kg   Physical Exam:  General: A/O x 4, No acute respiratory distress Eyes: negative scleral hemorrhage, negative anisocoria, negative icterus ENT: Negative Runny nose, negative gingival bleeding, Neck:  Negative scars, masses, torticollis, lymphadenopathy, JVD Lungs: Clear to auscultation bilaterally without wheezes or crackles Cardiovascular: Regular rate and rhythm without murmur gallop or rub normal S1 and S2 Abdomen: negative abdominal pain, nondistended, positive soft, bowel sounds, no rebound, no ascites, no appreciable mass Extremities: No significant cyanosis,  clubbing, or edema bilateral lower extremities Skin: Negative rashes, lesions, ulcers Psychiatric:  Negative depression, negative anxiety, negative fatigue, negative mania  Central nervous system:  Cranial nerves II through XII intact, tongue/uvula midline, all extremities muscle strength 5/5, sensation intact throughout,negative dysarthria, negative expressive aphasia, negative receptive aphasia.       Data Reviewed: Care during the described time interval was provided by me .  I have reviewed this patient's available data, including medical history, events of note, physical examination, and all test results as part of my evaluation.  CBC: Recent Labs  Lab 06/14/22 1425 06/14/22 1431 06/14/22 2045  WBC 8.5  --  8.8  NEUTROABS 4.6  --   --   HGB 13.7 13.9 14.2  HCT 40.6 41.0 40.7  MCV 91.0  --  87.9  PLT 241  --  643    Basic Metabolic Panel: Recent Labs  Lab 06/14/22 1425 06/14/22 1431 06/14/22 2045  NA 140 135  --   K 3.1* 3.0*  --   CL 95* 96*  --   CO2 22  --   --   GLUCOSE 133* 130*  --   BUN 13 13  --   CREATININE 0.64 0.50 0.48  CALCIUM 7.7*  --   --     GFR: Estimated Creatinine Clearance: 30.9 mL/min (by C-G formula based on SCr of 0.48 mg/dL). Liver Function Tests: Recent Labs  Lab 06/14/22 1425  AST 26  ALT 17  ALKPHOS 61  BILITOT 0.2*  PROT 5.9*  ALBUMIN 3.1*    No results for input(s): "LIPASE", "AMYLASE" in the last 168 hours. No results for input(s): "AMMONIA" in the last 168 hours. Coagulation Profile: Recent Labs  Lab 06/14/22 1425  INR 1.1    Cardiac Enzymes: No results for input(s): "CKTOTAL", "CKMB", "CKMBINDEX", "TROPONINI" in the last 168 hours. BNP (last 3 results) No results for input(s): "PROBNP" in the last 8760 hours. HbA1C: No results for input(s): "HGBA1C" in the last 72 hours. CBG: No results for input(s): "GLUCAP" in the last 168 hours. Lipid Profile: Recent Labs    06/14/22 2045  CHOL 157  HDL 45  LDLCALC 97   TRIG 77  CHOLHDL 3.5    Thyroid Function Tests: No results for input(s): "TSH", "T4TOTAL", "FREET4", "T3FREE", "THYROIDAB" in the last 72 hours. Anemia Panel: No results for input(s): "VITAMINB12", "FOLATE", "FERRITIN", "TIBC", "IRON", "RETICCTPCT" in the last 72 hours. Sepsis Labs: Recent Labs  Lab 06/14/22 1531 06/14/22 2045  LATICACIDVEN 2.3* 1.0     Recent Results (from the past 240 hour(s))  Culture, blood (Routine X 2) w Reflex to ID Panel     Status: None (Preliminary result)   Collection Time: 06/14/22  8:45 PM   Specimen: BLOOD  Result Value Ref Range Status   Specimen Description BLOOD SITE NOT SPECIFIED  Final   Special Requests   Final    BOTTLES DRAWN AEROBIC AND ANAEROBIC Blood Culture results may not be optimal due to an excessive volume of blood received in culture bottles   Culture   Final    NO GROWTH 2 DAYS Performed at [facility], [place] [address], [place], [place] [address]    Report Status PENDING  Incomplete  Urine Culture     Status: Abnormal (Preliminary result)   Collection Time: 06/14/22  8:45 PM   Specimen: Urine, Clean Catch  Result Value Ref Range Status   Specimen Description URINE, CLEAN CATCH  Final   Special Requests   Final    NONE Performed at [facility], [place] [address] [place], [place] [address]    Culture 80,000 COLONIES/mL GRAM NEGATIVE RODS (A)  Final   Report Status PENDING  Incomplete         Radiology Studies: ECHOCARDIOGRAM COMPLETE  Result Date: 06/15/2022    ECHOCARDIOGRAM REPORT   Patient Name:   SINA SUMPTER Date of Exam: 06/15/2022 Medical Rec #:  [id number]        Height:       64.0 in Accession #:    [id number]       Weight:       98.3 lb Date of Birth:  1926-10-26         BSA:  1.447 m Patient Age:    61 years         BP:           149/100 mmHg Patient Gender: F                HR:           86 bpm. Exam Location:  Inpatient Procedure: Limited Echo Indications:    Stroke I63.9  History:         Patient has prior history of Echocardiogram examinations, most                 recent 05/07/2015. Stroke; Risk Factors:Hypertension and                 Dyslipidemia.  Sonographer:    Ronny Flurry Referring Phys: [id number] [place]  1. Per sonographer, study canceled by physician. 12 clips obtained, parasternal long axis only.  2. Left ventricular ejection fraction, by estimation, is 65 to 70%. The left ventricle has normal function. The left ventricle has no regional wall motion abnormalities. There is moderate left ventricular hypertrophy. Left ventricular diastolic function  could not be evaluated.  3. Right ventricular systolic function is normal. The right ventricular size is normal.  4. The mitral valve is degenerative. Trivial mitral valve regurgitation. Severe mitral annular calcification.  5. The aortic valve is grossly normal. Aortic valve regurgitation is not visualized. FINDINGS  Left Ventricle: Left ventricular ejection fraction, by estimation, is 65 to 70%. The left ventricle has normal function. The left ventricle has no regional wall motion abnormalities. The left ventricular internal cavity size was normal in size. There is  moderate left ventricular hypertrophy. Left ventricular diastolic function could not be evaluated. Right Ventricle: The right ventricular size is normal. Right vetricular wall thickness was not assessed. Right ventricular systolic function is normal. Left Atrium: Left atrial size was not assessed. Right Atrium: Right atrial size was not assessed. Pericardium: Trivial pericardial effusion is present. Mitral Valve: The mitral valve is degenerative in appearance. There is mild thickening of the mitral valve leaflet(s). There is mild calcification of the mitral valve leaflet(s). Severe mitral annular calcification. Trivial mitral valve regurgitation. Tricuspid Valve: The tricuspid valve is not assessed. Aortic Valve: The aortic valve is grossly normal. Aortic  valve regurgitation is not visualized. Pulmonic Valve: The pulmonic valve was not assessed. Aorta: The aortic root is normal in size and structure. IAS/Shunts: The interatrial septum was not assessed.  LEFT VENTRICLE PLAX 2D LVOT diam:     1.60 cm LVOT Area:     2.01 cm   AORTA Ao Root diam: 2.60 cm  SHUNTS Systemic Diam: 1.60 cm Cherlynn Kaiser MD Electronically signed by Cherlynn Kaiser MD Signature Date/Time: 06/15/2022/12:27:01 PM    Final    Overnight EEG with video  Result Date: 06/15/2022 Lora Havens, MD     06/16/2022  7:38 AM Patient Name: KINSIE BELFORD MRN: [id number] Epilepsy Attending: Lora Havens Referring Physician/Provider: Derek Jack, MD Duration: 06/14/2022 1547 to 06/15/2022 1100 Patient history: 88 y.o. female with a history of altered mental status who is undergoing an EEG to evaluate for seizures. Level of alertness:  lethargic AEDs during EEG study: None Technical aspects: This EEG study was done with scalp electrodes positioned according to the 10-20 International system of electrode placement. Electrical activity was reviewed with band pass filter of 1-'70Hz'$ , sensitivity of 7 uV/mm, display speed of 52m/sec with a '60Hz'$  notched filter applied as  appropriate. EEG data were recorded continuously and digitally stored.  Video monitoring was available and reviewed as appropriate. Description: EEG showed continuous generalized 3 to 6 Hz theta-delta slowing. Generalized periodic discharges at 1.5-'3hz'$  were also noted consistent with electrographic status epilepticus.  Hyperventilation and photic stimulation were not performed.   ABNORMALITY - Electrographic status epilepticus, generalized - Continuous slow, generalized IMPRESSION: This study showed evidence of electrographic status epilepticus with generalized onset. Additionally there is moderate to severe diffuse encephalopathy Dr. Leonel Ramsay was notified. Lora Havens   EEG adult  Result Date: 06/14/2022 Derek Jack, MD     06/14/2022  3:53 PM Routine EEG Report LINDSIE SIMAR is a 88 y.o. female with a history of altered mental status who is undergoing an EEG to evaluate for seizures. Report: This EEG was acquired with electrodes placed according to the International 10-20 electrode system (including Fp1, Fp2, F3, F4, C3, C4, P3, P4, O1, O2, T3, T4, T5, T6, A1, A2, Fz, Cz, Pz). The following electrodes were missing or displaced: none. The occipital dominant rhythm was 7 Hz with intermittent superimposed further diffuse slowing. This activity is reactive to stimulation. Drowsiness was manifested by background fragmentation; deeper stages of sleep were not identified. There was no focal slowing. There were frequent runs q 3-4 seconds of generalized periodic discharges up to 2-3 Hz lasting 3 seconds or less. There were no electrographic seizures identified. Photic stimulation and hyperventilation were not performed. Impression and clinical correlation: This EEG was obtained while awake and drowsy and is abnormal due to: - Mild-to-moderate diffuse slowing indicative of global cerebral dysfunction - Frequent Brief Potentially Ictal Rhythmic Discharges (BIRDs) lasting 3 seconds or less indicating increased epileptic potential No definitive electrographic seizures were observed. Recommend prolonged EEG. Su Monks, MD [facility] [phone] If 7pm- 7am, please page neurology on call as listed in [place].   CT CERVICAL SPINE WO CONTRAST  Result Date: 06/14/2022 CLINICAL DATA:  Neck trauma EXAM: CT Cervical Spine with contrast TECHNIQUE: Multiplanar CT images of the cervical spine were reconstructed from contemporary CTA of the Neck. RADIATION DOSE REDUCTION: This exam was performed according to the departmental dose-optimization program which includes automated exposure control, adjustment of the mA and/or kV according to patient size and/or use of iterative reconstruction technique. COMPARISON:  None  Available. FINDINGS: Alignment: Normal. There is no antero or retrolisthesis. There is no jumped or perched facet or other evidence of traumatic malalignment. Skull base and vertebrae: Skull base alignment is maintained. Vertebral body heights are preserved. There is no acute fracture. There is no suspicious osseous lesion. There is fusion of the posterior elements from C3 through C5 on the left and at C4-C5 on the right. Soft tissues and spinal canal: No prevertebral fluid or swelling. No visible canal hematoma. Disc levels: There is overall mild for age degenerative change in the cervical spine with mild multilevel facet arthropathy, and disc space narrowing degenerative endplate change most advanced at C5-C6 and C6-C7. There is no evidence of high-grade osseous spinal canal or neural foraminal stenosis. Upper chest: There is scarring in the lung apices. Other: None. IMPRESSION: No acute fracture or traumatic malalignment of the cervical spine. Electronically Signed   By: Valetta Mole M.D.   On: 06/14/2022 15:25   CT HEAD CODE STROKE WO CONTRAST  Result Date: 06/14/2022 CLINICAL DATA:  Code stroke.  Neck trauma EXAM: CT ANGIOGRAPHY HEAD AND NECK TECHNIQUE: Multidetector CT imaging of the head and neck was performed using the standard protocol  during bolus administration of intravenous contrast. Multiplanar CT image reconstructions and MIPs were obtained to evaluate the vascular anatomy. Carotid stenosis measurements (when applicable) are obtained utilizing NASCET criteria, using the distal internal carotid diameter as the denominator. RADIATION DOSE REDUCTION: This exam was performed according to the departmental dose-optimization program which includes automated exposure control, adjustment of the mA and/or kV according to patient size and/or use of iterative reconstruction technique. CONTRAST:  65m OMNIPAQUE IOHEXOL 350 MG/ML SOLN COMPARISON:  None Available. FINDINGS: CT HEAD FINDINGS Brain: There is no  acute intracranial hemorrhage, extra-axial fluid collection, or acute infarct Parenchymal volume is normal for age. The ventricles are normal in size. Gray-white differentiation is preserved. There is confluent hypodensity throughout the supratentorial white matter likely reflecting advanced background chronic small-vessel ischemic change. There are probable remote infarcts in the bilateral thalami. There is a partially calcified extra-axial lesion overlying the high right frontal lobe at the vertex measuring 1.3 cm x 1.6 cm consistent with a meningioma. There is no significant mass effect on the underlying brain parenchyma or midline shift. Vascular: No hyperdense vessel or unexpected calcification. There is calcification of the bilateral carotid siphons and vertebral arteries. Skull: Normal. Negative for fracture or focal lesion. Sinuses/Orbits: There is layering fluid in the bilateral maxillary sinuses. Bilateral lens implants are in place. The globes and orbits are otherwise unremarkable. Other: None. CTA NECK FINDINGS Aortic arch: There is calcified plaque in the imaged aortic arch. The origins of the major branch vessels are patent. The subclavian arteries are patent to the level imaged. Right carotid system: The right common, internal, and external carotid arteries are patent, with mild plaque of the bifurcation but no hemodynamically significant stenosis or occlusion. There is no dissection or aneurysm. Left carotid system: The left common, internal, and external carotid arteries are patent, with mild plaque at the bifurcation but no hemodynamically significant stenosis or occlusion. There is no dissection or aneurysm. Vertebral arteries: The vertebral arteries are patent, without hemodynamically significant stenosis or occlusion. There is no dissection or aneurysm. Skeleton: There is no acute osseous abnormality or suspicious osseous lesion. There is no visible canal hematoma. Other neck: There is a 9 mm  right thyroid nodule requiring no specific imaging follow-up. Upper chest: There is biapical scarring. Review of the MIP images confirms the above findings CTA HEAD FINDINGS Anterior circulation: There is mild calcified plaque in the carotid siphons but no hemodynamically significant stenosis or occlusion. The bilateral MCAs are patent, without proximal stenosis or occlusion. The bilateral ACAs are patent, without proximal stenosis or occlusion. There is no aneurysm or AVM. Posterior circulation: The bilateral V4 segments are patent. The basilar artery is patent. The major cerebellar arteries appear patent. The bilateral PCAs are patent, without proximal stenosis or occlusion. A right posterior communicating artery is identified. There is no aneurysm or AVM. Venous sinuses: Not well assessed due to bolus timing. Anatomic variants: None. Review of the MIP images confirms the above findings IMPRESSION: 1. No acute intracranial pathology. 2. Patent vasculature of the head and neck with no hemodynamically significant stenosis or occlusion. 3. Advanced background chronic small-vessel ischemic change. 4. 1.6 cm meningioma overlying the high right frontal lobe. 5. Layering fluid in the maxillary sinuses could reflect acute sinusitis in the correct clinical setting. Findings from the noncontrast head CT were communicated to Dr. XErlinda Hongvia amion at 2:38 pm. CTA findings were communicated at 2:52 p.m. Electronically Signed   By: PValetta MoleM.D.   On: 06/14/2022 15:21  CT ANGIO HEAD NECK W WO CM  Result Date: 06/14/2022 CLINICAL DATA:  Code stroke.  Neck trauma EXAM: CT ANGIOGRAPHY HEAD AND NECK TECHNIQUE: Multidetector CT imaging of the head and neck was performed using the standard protocol during bolus administration of intravenous contrast. Multiplanar CT image reconstructions and MIPs were obtained to evaluate the vascular anatomy. Carotid stenosis measurements (when applicable) are obtained utilizing NASCET criteria,  using the distal internal carotid diameter as the denominator. RADIATION DOSE REDUCTION: This exam was performed according to the departmental dose-optimization program which includes automated exposure control, adjustment of the mA and/or kV according to patient size and/or use of iterative reconstruction technique. CONTRAST:  45m OMNIPAQUE IOHEXOL 350 MG/ML SOLN COMPARISON:  None Available. FINDINGS: CT HEAD FINDINGS Brain: There is no acute intracranial hemorrhage, extra-axial fluid collection, or acute infarct Parenchymal volume is normal for age. The ventricles are normal in size. Gray-white differentiation is preserved. There is confluent hypodensity throughout the supratentorial white matter likely reflecting advanced background chronic small-vessel ischemic change. There are probable remote infarcts in the bilateral thalami. There is a partially calcified extra-axial lesion overlying the high right frontal lobe at the vertex measuring 1.3 cm x 1.6 cm consistent with a meningioma. There is no significant mass effect on the underlying brain parenchyma or midline shift. Vascular: No hyperdense vessel or unexpected calcification. There is calcification of the bilateral carotid siphons and vertebral arteries. Skull: Normal. Negative for fracture or focal lesion. Sinuses/Orbits: There is layering fluid in the bilateral maxillary sinuses. Bilateral lens implants are in place. The globes and orbits are otherwise unremarkable. Other: None. CTA NECK FINDINGS Aortic arch: There is calcified plaque in the imaged aortic arch. The origins of the major branch vessels are patent. The subclavian arteries are patent to the level imaged. Right carotid system: The right common, internal, and external carotid arteries are patent, with mild plaque of the bifurcation but no hemodynamically significant stenosis or occlusion. There is no dissection or aneurysm. Left carotid system: The left common, internal, and external carotid  arteries are patent, with mild plaque at the bifurcation but no hemodynamically significant stenosis or occlusion. There is no dissection or aneurysm. Vertebral arteries: The vertebral arteries are patent, without hemodynamically significant stenosis or occlusion. There is no dissection or aneurysm. Skeleton: There is no acute osseous abnormality or suspicious osseous lesion. There is no visible canal hematoma. Other neck: There is a 9 mm right thyroid nodule requiring no specific imaging follow-up. Upper chest: There is biapical scarring. Review of the MIP images confirms the above findings CTA HEAD FINDINGS Anterior circulation: There is mild calcified plaque in the carotid siphons but no hemodynamically significant stenosis or occlusion. The bilateral MCAs are patent, without proximal stenosis or occlusion. The bilateral ACAs are patent, without proximal stenosis or occlusion. There is no aneurysm or AVM. Posterior circulation: The bilateral V4 segments are patent. The basilar artery is patent. The major cerebellar arteries appear patent. The bilateral PCAs are patent, without proximal stenosis or occlusion. A right posterior communicating artery is identified. There is no aneurysm or AVM. Venous sinuses: Not well assessed due to bolus timing. Anatomic variants: None. Review of the MIP images confirms the above findings IMPRESSION: 1. No acute intracranial pathology. 2. Patent vasculature of the head and neck with no hemodynamically significant stenosis or occlusion. 3. Advanced background chronic small-vessel ischemic change. 4. 1.6 cm meningioma overlying the high right frontal lobe. 5. Layering fluid in the maxillary sinuses could reflect acute sinusitis in the correct  clinical setting. Findings from the noncontrast head CT were communicated to Dr. Erlinda Hong via amion at 2:38 pm. CTA findings were communicated at 2:52 p.m. Electronically Signed   By: Valetta Mole M.D.   On: 06/14/2022 15:21        Scheduled  Meds:  metoprolol tartrate  2.5 mg Intravenous Q8H   Continuous Infusions:  sodium chloride 50 mL/hr at 06/15/22 0955   levETIRAcetam 500 mg (06/15/22 1954)     LOS: 1 day    Time spent:40 min    Reggie Welge, Geraldo Docker, MD [facility]   If 7PM-7AM, please contact night-coverage 06/16/2022, 9:00 AM

## 2022-06-16 NOTE — Plan of Care (Signed)
  Problem: Coping: Goal: Will identify appropriate support needs Outcome: Progressing   Problem: Self-Care: Goal: Ability to communicate needs accurately will improve Outcome: Progressing   Problem: Nutrition: Goal: Risk of aspiration will decrease Outcome: Progressing   Problem: Education: Goal: Knowledge of General Education information will improve Description: Including pain rating scale, medication(s)/side effects and non-pharmacologic comfort measures Outcome: Progressing   Problem: Clinical Measurements: Goal: Respiratory complications will improve Outcome: Progressing

## 2022-06-17 ENCOUNTER — Inpatient Hospital Stay (HOSPITAL_COMMUNITY): Payer: Medicare HMO

## 2022-06-17 DIAGNOSIS — I341 Nonrheumatic mitral (valve) prolapse: Secondary | ICD-10-CM | POA: Diagnosis not present

## 2022-06-17 DIAGNOSIS — E7800 Pure hypercholesterolemia, unspecified: Secondary | ICD-10-CM | POA: Diagnosis not present

## 2022-06-17 DIAGNOSIS — R5381 Other malaise: Secondary | ICD-10-CM

## 2022-06-17 DIAGNOSIS — J4520 Mild intermittent asthma, uncomplicated: Secondary | ICD-10-CM | POA: Diagnosis not present

## 2022-06-17 DIAGNOSIS — I10 Essential (primary) hypertension: Secondary | ICD-10-CM | POA: Diagnosis not present

## 2022-06-17 LAB — COMPREHENSIVE METABOLIC PANEL
ALT: 14 U/L (ref 0–44)
AST: 18 U/L (ref 15–41)
Albumin: 2.6 g/dL — ABNORMAL LOW (ref 3.5–5.0)
Alkaline Phosphatase: 55 U/L (ref 38–126)
Anion gap: 11 (ref 5–15)
BUN: 12 mg/dL (ref 8–23)
CO2: 25 mmol/L (ref 22–32)
Calcium: 8 mg/dL — ABNORMAL LOW (ref 8.9–10.3)
Chloride: 96 mmol/L — ABNORMAL LOW (ref 98–111)
Creatinine, Ser: 0.62 mg/dL (ref 0.44–1.00)
GFR, Estimated: >60 mL/min (ref >60)
Glucose, Bld: 63 mg/dL — ABNORMAL LOW (ref 70–99)
Potassium: 2.9 mmol/L — ABNORMAL LOW (ref 3.5–5.1)
Sodium: 132 mmol/L — ABNORMAL LOW (ref 135–145)
Total Bilirubin: 1.3 mg/dL — ABNORMAL HIGH (ref 0.3–1.2)
Total Protein: 5.2 g/dL — ABNORMAL LOW (ref 6.5–8.1)

## 2022-06-17 LAB — CBC WITH DIFFERENTIAL/PLATELET
Abs Immature Granulocytes: 0.09 10*3/uL — ABNORMAL HIGH (ref 0.00–0.07)
Basophils Absolute: 0 10*3/uL (ref 0.0–0.1)
Basophils Relative: 0 %
Eosinophils Absolute: 0.1 10*3/uL (ref 0.0–0.5)
Eosinophils Relative: 1 %
HCT: 38 % (ref 36.0–46.0)
Hemoglobin: 12.9 g/dL (ref 12.0–15.0)
Immature Granulocytes: 1 %
Lymphocytes Relative: 12 %
Lymphs Abs: 1.1 10*3/uL (ref 0.7–4.0)
MCH: 29.9 pg (ref 26.0–34.0)
MCHC: 33.9 g/dL (ref 30.0–36.0)
MCV: 88 fL (ref 80.0–100.0)
Monocytes Absolute: 0.5 10*3/uL (ref 0.1–1.0)
Monocytes Relative: 6 %
Neutro Abs: 7.1 10*3/uL (ref 1.7–7.7)
Neutrophils Relative %: 80 %
Platelets: 224 10*3/uL (ref 150–400)
RBC: 4.32 MIL/uL (ref 3.87–5.11)
RDW: 12.5 % (ref 11.5–15.5)
WBC: 9 10*3/uL (ref 4.0–10.5)
nRBC: 0 % (ref 0.0–0.2)

## 2022-06-17 LAB — URINE CULTURE: Culture: 80000 — AB

## 2022-06-17 LAB — HEMOGLOBIN A1C
Hgb A1c MFr Bld: 6.1 % — ABNORMAL HIGH (ref 4.8–5.6)
Mean Plasma Glucose: 128 mg/dL

## 2022-06-17 LAB — MAGNESIUM: Magnesium: 1.6 mg/dL — ABNORMAL LOW (ref 1.7–2.4)

## 2022-06-17 LAB — PHOSPHORUS: Phosphorus: 3.3 mg/dL (ref 2.5–4.6)

## 2022-06-17 MED ORDER — MAGNESIUM SULFATE 50 % IJ SOLN
3.0000 g | Freq: Once | INTRAVENOUS | Status: AC
  Administered 2022-06-17: 3 g via INTRAVENOUS
  Filled 2022-06-17: qty 6

## 2022-06-17 MED ORDER — MUSCLE RUB 10-15 % EX CREA
TOPICAL_CREAM | Freq: Two times a day (BID) | CUTANEOUS | Status: DC | PRN
  Filled 2022-06-17: qty 85

## 2022-06-17 MED ORDER — TROLAMINE SALICYLATE 10 % EX CREA: TOPICAL_CREAM | Freq: Two times a day (BID) | CUTANEOUS | Status: DC | PRN

## 2022-06-17 MED ORDER — POTASSIUM CHLORIDE CRYS ER 20 MEQ PO TBCR
40.0000 meq | EXTENDED_RELEASE_TABLET | Freq: Two times a day (BID) | ORAL | Status: AC
  Administered 2022-06-17 – 2022-06-18 (×3): 40 meq via ORAL
  Filled 2022-06-17 (×4): qty 2

## 2022-06-17 MED ORDER — LEVETIRACETAM 100 MG/ML PO SOLN
500.0000 mg | Freq: Two times a day (BID) | ORAL | Status: DC
  Administered 2022-06-17 – 2022-06-19 (×5): 500 mg via ORAL
  Filled 2022-06-17 (×5): qty 5

## 2022-06-17 NOTE — TOC Progression Note (Signed)
Transition of Care [facility]) - Progression Note    Patient Details  Name: Paula Jimenez MRN: [id number] Date of Birth: 1926/07/29  Transition of Care [facility]) CM/SW Contact  Pollie Friar, RN Phone Number: 06/17/2022, 2:23 PM  Clinical Narrative:    Pt is from home with her daughter, [name]. Pt is already active with home health services through [facility]. Information on the AVS.  Pt has been managing her own medications. She was driving but family can assist with transportation and her MD has a Lucianne Lei she can ride in.  TOC following for further d/c needs.    Expected Discharge Plan: [place] Barriers to Discharge: Continued Medical Work up  Expected Discharge Plan and Services   Discharge Planning Services: CM Consult Post Acute Care Choice: [place] arrangements for the past 2 months: Single Family Home                           HH Arranged: PT, OT, Social Work [facility] Agency: [place] Date [place]: 06/17/22   Representative spoke with at [place]: [place] Interventions SDOH Screenings   Food Insecurity: No Food Insecurity (06/15/2022)  Housing: Low Risk  (06/15/2022)  Transportation Needs: No Transportation Needs (06/15/2022)  Utilities: Not At Risk (06/15/2022)  Alcohol Screen: Low Risk  (11/26/2020)  Depression (PHQ2-9): Low Risk  (12/18/2021)  Financial Resource Strain: Low Risk  (12/18/2021)  Physical Activity: Inactive (12/18/2021)  Stress: No Stress Concern Present (12/18/2021)  Tobacco Use: Low Risk  (06/15/2022)    Readmission Risk Interventions     No data to display

## 2022-06-17 NOTE — Progress Notes (Signed)
PROGRESS NOTE    Paula Jimenez  KPT:[id number] DOB: 1927/01/01 DOA: 06/14/2022 PCP: Abner Greenspan, MD     Brief Narrative:  Paula Jimenez is a 88 y.o. WF PMHx   HLD, HTN, mitral valve prolapse, melanoma, osteoporosis, asthma.  She has had cough and congestion over the past week.  She was seen at urgent care 2 days ago for this and underwent chest x-ray.  Yesterday, she complained to family about a "funny feeling" in her head.  This continued today.  While in the bathroom, she had an unwitnessed fall.  Family heard her fall and went to check on her.  She did have a loss of consciousness that lasted for approximately 6 minutes.  At baseline, patient ambulates without walker or cane.  She does not have any dementia or memory deficits at baseline.  EMS reports initial GCS of 5 with concern of posturing.  Responsiveness improved during transit.      Subjective: 12/26 afebrile overnight A/O x 4.  Family at bedside.  Family states unable to care for patient at home given her current deconditioning.       Assessment & Plan: Covid vaccination;   Principal Problem:   Stroke [facility]) Active Problems:   HLD (hyperlipidemia)   Essential hypertension   Asthma, chronic   Mitral valve prolapse   Physical deconditioning  Stroke - Stroke order set used for admission - Seizure workup per neurology -Blood and urine culture pending - 12/24 stroke has been ruled out by CTA head and neck. - 12/24 patient remains in status epilepticus -12/24 after family spoke with neurologist and he confirmed that patient still having seizures decided on comfort care. -07/15/23 see status epilepticus  Status Epilepticus -12/24 discussed case with Neurology Dr. Donnetta Simpers, recommends Keppra IM 500 mg BID -12/24 discussed case with pharmacy we do not have that formulation for IM Keppra, therefore we will keep her on Keppra IV 500 mg BID -12/24 if family chooses to take patient home: Home comfort care we will  try to continue Keppra 500 mg p.o. BID or discontinue it altogether. - 12/24 neurology recommends Ativan 1- 2 mg IM as needed for clinical seizure activity -See palliative care consult -07/15/2023 spoke with family overnight status epilepticus resolved.  Would like to continue trying medication. 07-15-2023 resend comfort care - Jul 15, 2023 convert to Keppra 500 mg PO BID (solution ) once patient has passed swallow study 07-15-23 PT/OT consult Evaluate for patient [facility] vs SNF  -07-15-23 discussed case with Dr. Rory Percy neurology concurs follow-up in 3 months -12/26 prior to discharge obtain establish care appointment for outpatient neurology in 3 months. -12/26 patient passed swallow evaluation.  Start Keppra 500 mg BID (solution )  Dysphagia 12/26 passed her swallow evaluation now on dysphagia 3 fluid consistency thin.   Essential HTN/mitral valve prolapse - Elevated allow permissive HTN until stroke ruled out. 15-Jul-2023 now that patient no longer comfort care will need to normalize BP. -07/15/23 Metoprolol IV 5 mg TID -07-15-23 Hydralazine PRN -Echocardiogram no significant see results below  Right hand pain - 12/26 x-ray shows bruising negative fracture   HLD -12/23 lipid panel: LDL= 97  Hypokalemia - Potassium goal>4 - 12/26 K-Dur 63mq BID x 3 doses  Hypomagnesmia - Magnesium goal> 2 - 12/26 magnesium IV 3 g  Physical Deconditioning - 12/26 PT/OT consult: Patient's family unable to care for patient at home in her current state of conditioning.  Reevaluate and place progress note for SNF   Goals  of care 12/26 TOC consult:Patient's family unable to care for patient at home in her current state of conditioning.  Reevaluate and place progress note for SNF    -12/24 Consult Palliative Care: Goals of care and status epilepticus, family indicated to neurology they did not want any further aggressive measures.  Would prefer comfort care.  Discussed case with neurology and he agrees that if she wakes up enough  and the family wants home comfort care we should provide it.  Discuss hospital/residential hospice vs home with comfort care    Mobility Assessment (last 72 hours)     Mobility Assessment     Row Name 06/17/22 1100 06/17/22 1046 06/16/22 1945 06/15/22 1940 06/15/22 02:45:14   Does patient have an order for bedrest or is patient medically unstable -- -- Yes- Bedfast (Level 1) - Complete Yes- Bedfast (Level 1) - Complete Yes- Bedfast (Level 1) - Complete   What is the highest level of mobility based on the progressive mobility assessment? Level 5 (Walks with assist in room/hall) - Balance while stepping forward/back and can walk in room with assist - Complete Level 4 (Walks with assist in room) - Balance while marching in place and cannot step forward and back - Complete -- -- --                    DVT prophylaxis: COMFORT CARE Code Status: COMFORT CARE Family Communication: 12/26 son and daughters at bedside discussed plan of care all questions answered Status is: Inpatient    Dispo: The patient is from: Home              Anticipated d/c is to: Home??              Anticipated d/c date is: 3 days              Patient currently is not medically stable to d/c.      Consultants:  Neurology   Procedures/Significant Events:  EEG CTA head and neck 12/24 Echocardiogram Left Ventricle: Left ventricular ejection fraction, by estimation, is 65  to 70%. The left ventricle has normal function.  moderate left ventricular hypertrophy. Left ventricular diastolic  function could not be evaluated.     I have personally reviewed and interpreted all radiology studies and my findings are as above.  VENTILATOR SETTINGS:    Cultures   Antimicrobials:    Devices    LINES / TUBES:      Continuous Infusions:  sodium chloride 50 mL/hr at 06/16/22 1302     Objective: Vitals:   06/16/22 0727 06/16/22 2250 06/17/22 0618 06/17/22 0722  BP: (!) 189/90 (!) 167/97 (!)  170/90 (!) 152/81  Pulse: 82 97 97 80  Resp: 16 (!) 23 (!) 22 16  Temp: 97.7 F (36.5 C) 98.1 F (36.7 C)  98.3 F (36.8 C)  TempSrc: Oral Oral  Oral  SpO2: 97% 98%  97%  Weight:      Height:        Intake/Output Summary (Last 24 hours) at 06/17/2022 1814 Last data filed at 06/17/2022 1300 Gross per 24 hour  Intake 1453.03 ml  Output 700 ml  Net 753.03 ml   Filed Weights   06/14/22 1400  Weight: 44.6 kg   Physical Exam:  General: A/O x 4, No acute respiratory distress, cachectic Eyes: negative scleral hemorrhage, negative anisocoria, negative icterus ENT: Negative Runny nose, negative gingival bleeding, Neck:  Negative scars, masses, torticollis, lymphadenopathy, JVD Lungs: Clear to  auscultation bilaterally without wheezes or crackles Cardiovascular: Regular rate and rhythm without murmur gallop or rub normal S1 and S2 Abdomen: negative abdominal pain, nondistended, positive soft, bowel sounds, no rebound, no ascites, no appreciable mass Extremities: No significant cyanosis, clubbing, or edema bilateral lower extremities, right hand pain over thumb Skin: Negative rashes, lesions, ulcers Psychiatric:  Negative depression, negative anxiety, negative fatigue, negative mania  Central nervous system:  Cranial nerves II through XII intact, tongue/uvula midline, all extremities muscle strength 5/5, sensation intact throughout,  negative dysarthria, negative expressive aphasia, negative receptive aphasia.       Data Reviewed: Care during the described time interval was provided by me .  I have reviewed this patient's available data, including medical history, events of note, physical examination, and all test results as part of my evaluation.  CBC: Recent Labs  Lab 06/14/22 1425 06/14/22 1431 06/14/22 2045 06/17/22 0417  WBC 8.5  --  8.8 9.0  NEUTROABS 4.6  --   --  7.1  HGB 13.7 13.9 14.2 12.9  HCT 40.6 41.0 40.7 38.0  MCV 91.0  --  87.9 88.0  PLT 241  --  225 378    Basic Metabolic Panel: Recent Labs  Lab 06/14/22 1425 06/14/22 1431 06/14/22 2045 06/16/22 1541 06/17/22 0417  NA 140 135  --  129* 132*  K 3.1* 3.0*  --  3.1* 2.9*  CL 95* 96*  --  91* 96*  CO2 22  --   --  23 25  GLUCOSE 133* 130*  --  81 63*  BUN 13 13  --  9 12  CREATININE 0.64 0.50 0.48 0.55 0.62  CALCIUM 7.7*  --   --  8.3* 8.0*  MG  --   --   --   --  1.6*  PHOS  --   --   --   --  3.3   GFR: Estimated Creatinine Clearance: 30.9 mL/min (by C-G formula based on SCr of 0.62 mg/dL). Liver Function Tests: Recent Labs  Lab 06/14/22 1425 06/16/22 1541 06/17/22 0417  AST '26 20 18  '$ ALT '17 16 14  '$ ALKPHOS 61 61 55  BILITOT 0.2* 1.5* 1.3*  PROT 5.9* 5.7* 5.2*  ALBUMIN 3.1* 2.8* 2.6*   No results for input(s): "LIPASE", "AMYLASE" in the last 168 hours. No results for input(s): "AMMONIA" in the last 168 hours. Coagulation Profile: Recent Labs  Lab 06/14/22 1425  INR 1.1   Cardiac Enzymes: No results for input(s): "CKTOTAL", "CKMB", "CKMBINDEX", "TROPONINI" in the last 168 hours. BNP (last 3 results) No results for input(s): "PROBNP" in the last 8760 hours. HbA1C: Recent Labs    06/14/22 2045  HGBA1C 6.1*   CBG: No results for input(s): "GLUCAP" in the last 168 hours. Lipid Profile: Recent Labs    06/14/22 2045  CHOL 157  HDL 45  LDLCALC 97  TRIG 77  CHOLHDL 3.5   Thyroid Function Tests: No results for input(s): "TSH", "T4TOTAL", "FREET4", "T3FREE", "THYROIDAB" in the last 72 hours. Anemia Panel: No results for input(s): "VITAMINB12", "FOLATE", "FERRITIN", "TIBC", "IRON", "RETICCTPCT" in the last 72 hours. Sepsis Labs: Recent Labs  Lab 06/14/22 1531 06/14/22 2045  LATICACIDVEN 2.3* 1.0    Recent Results (from the past 240 hour(s))  Culture, blood (Routine X 2) w Reflex to ID Panel     Status: None (Preliminary result)   Collection Time: 06/14/22  8:45 PM   Specimen: BLOOD  Result Value Ref Range Status   Specimen Description BLOOD SITE  NOT  SPECIFIED  Final   Special Requests   Final    BOTTLES DRAWN AEROBIC AND ANAEROBIC Blood Culture results may not be optimal due to an excessive volume of blood received in culture bottles   Culture   Final    NO GROWTH 3 DAYS Performed at [facility], [place] [address] [place], [place] [address]    Report Status PENDING  Incomplete  Urine Culture     Status: Abnormal   Collection Time: 06/14/22  8:45 PM   Specimen: Urine, Clean Catch  Result Value Ref Range Status   Specimen Description URINE, CLEAN CATCH  Final   Special Requests   Final    NONE Performed at [facility], [place] [address], [place], [place] [address]    Culture 80,000 COLONIES/mL ESCHERICHIA COLI (A)  Final   Report Status 06/17/2022 FINAL  Final   Organism ID, Bacteria ESCHERICHIA COLI (A)  Final      Susceptibility   Escherichia coli - MIC*    AMPICILLIN <=2 SENSITIVE Sensitive     CEFAZOLIN <=4 SENSITIVE Sensitive     CEFEPIME <=0.12 SENSITIVE Sensitive     CEFTRIAXONE <=0.25 SENSITIVE Sensitive     CIPROFLOXACIN <=0.25 SENSITIVE Sensitive     GENTAMICIN <=1 SENSITIVE Sensitive     IMIPENEM <=0.25 SENSITIVE Sensitive     NITROFURANTOIN <=16 SENSITIVE Sensitive     TRIMETH/SULFA <=20 SENSITIVE Sensitive     AMPICILLIN/SULBACTAM <=2 SENSITIVE Sensitive     PIP/TAZO <=4 SENSITIVE Sensitive     * 80,000 COLONIES/mL ESCHERICHIA COLI         Radiology Studies: DG Hand 2 View Right  Result Date: 06/17/2022 CLINICAL DATA:  Right thumb pain.  Recent fall. EXAM: RIGHT HAND - 2 VIEW COMPARISON:  None Available. FINDINGS: No evidence of acute fracture. No dislocation. 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 No erosion. Bones are demineralized. There is soft tissue swelling about the thumb. IMPRESSION: Soft tissue swelling about the thumb without acute fracture or dislocation. Electronically Signed   By: Davina Poke D.O.   On: 06/17/2022 14:58        Scheduled Meds:  levETIRAcetam  500 mg Oral BID   metoprolol tartrate  5 mg Intravenous Q8H   potassium chloride  40 mEq Oral BID   Continuous Infusions:  sodium chloride 50 mL/hr at 06/16/22 1302     LOS: 2 days    Time spent:40 min    Milcah Dulany, Geraldo Docker, MD [facility]   If 7PM-7AM, please contact night-coverage 06/17/2022, 6:14 PM

## 2022-06-17 NOTE — Progress Notes (Signed)
PT Cancellation Note  Patient Details Name: Paula Jimenez MRN: [id number] DOB: 17-Nov-1926   Cancelled Treatment:    Reason Eval/Treat Not Completed: Patient at procedure or test/unavailable  Patient currently working with Paula Jimenez, [place]  Office [phone]  Paula Jimenez 06/17/2022, 8:31 AM

## 2022-06-17 NOTE — Evaluation (Addendum)
Occupational Therapy Evaluation Patient Details Name: Paula Jimenez MRN: [id number] DOB: August 06, 1926 Today's Date: 06/17/2022   History of Present Illness 88 year old female presented 06/14/22 after an unwitnessed fall with loss of consciousness.  No brain bleed or ischemic CVA.  Noted seizures and remained in status epilepticus for some time prompting family to make her comfort care. However pt "woke up" 06/16/22 and family rescinded comfort care.   Clinical Impression   PTA pt lives with her family and is modified independent with mobility and ADL tasks. Daughter helps with IADL tasks as needed.  Pt demonstrates a functional decline, requiring mod to total A with ADL tasks and min A with limited mobility due to below listed deficits. Family present and state they can provide necessary assistance at DC. Pt has a transport chair at home. Educated family that she will most likely need to use the transport chair for longer distances and the 3in1 for toileting until she is stronger. Family/pt verbalized understanding. Recommend home with direct assistance for ADL and mobility and HHOT. Acute OT to follow.      Recommendations for follow up therapy are one component of a multi-disciplinary discharge planning process, led by the attending physician.  Recommendations may be updated based on patient status, additional functional criteria and insurance authorization.   Follow Up Recommendations  Home health OT     Assistance Recommended at Discharge Frequent or constant Supervision/Assistance  Patient can return home with the following A lot of help with walking and/or transfers;A lot of help with bathing/dressing/bathroom;Assistance with cooking/housework;Direct supervision/assist for medications management;Direct supervision/assist for financial management;Assist for transportation;Help with stairs or ramp for entrance    Functional Status Assessment  Patient has had a recent decline in their  functional status and demonstrates the ability to make significant improvements in function in a reasonable and predictable amount of time.  Equipment Recommendations  None recommended by OT    Recommendations for Other Services       Precautions / Restrictions Precautions Precautions: Fall      Mobility Bed Mobility Overal bed mobility: Needs Assistance Bed Mobility: Sit to Supine       Sit to supine: Supervision        Transfers Overall transfer level: Needs assistance Equipment used: 1 person hand held assist Transfers: Sit to/from Stand Sit to Stand: Min assist                  Balance Overall balance assessment: Needs assistance Sitting-balance support: Feet supported Sitting balance-Leahy Scale: Fair       Standing balance-Leahy Scale: Poor                             ADL either performed or assessed with clinical judgement   ADL Overall ADL's : Needs assistance/impaired  Issued red tubing to help hold utensils; set up/S for feeding; per speech pocketing   Grooming: Minimal assistance   Upper Body Bathing: Minimal assistance   Lower Body Bathing: Moderate assistance   Upper Body Dressing : Minimal assistance   Lower Body Dressing: Maximal assistance   Toilet Transfer: Minimal assistance;Stand-pivot;Ambulation;BSC/3in1   Toileting- Water quality scientist and Hygiene: Total assistance Toileting - Clothing Manipulation Details (indicate cue type and reason): incontinenet of BM and urine; total A to clean     Functional mobility during ADLs: Minimal assistance;issued gait belt       Vision   Additional Comments: appears at baseline  Perception     Praxis      Pertinent Vitals/Pain Pain Assessment Pain Assessment: Faces Faces Pain Scale: Hurts whole lot Pain Location: Rt thumb Pain Descriptors / Indicators: Aching, Nagging, Sharp Pain Intervention(s): Limited activity within patient's tolerance, Other (comment) (nsg  notified MD)     Hand Dominance Right   Extremity/Trunk Assessment Upper Extremity Assessment Upper Extremity Assessment: RUE deficits/detail RUE Deficits / Details: R thumb pain and limited mobility,especially @ MP and CMC joint, affecting ability to use hand functionally for self feeidngand ADL RUE Coordination: decreased fine motor   Lower Extremity Assessment Lower Extremity Assessment: Defer to PT evaluation   Cervical / Trunk Assessment Cervical / Trunk Assessment: Normal   Communication Communication Communication: No difficulties   Cognition Arousal/Alertness: Awake/alert Behavior During Therapy: WFL for tasks assessed/performed Overall Cognitive Status: Impaired/Different from baseline (not specifically tested; a&ox4) Area of Impairment: Attention, Safety/judgement, Awareness                   Current Attention Level: Selective     Safety/Judgement: Decreased awareness of deficits Awareness: Emergent   General Comments: slow processing     General Comments  family present and and state they can provide level of care    Exercises     Shoulder Instructions      Home Living Family/patient expects to be discharged to:: Private residence Living Arrangements: Children Available Help at Discharge: Family;Available 24 hours/day Type of Home: House Home Access: Ramped entrance     Home Layout: One level     Bathroom Shower/Tub: Occupational psychologist: Standard Bathroom Accessibility: Yes How Accessible: Accessible via walker Home Equipment: [place] (2 wheels);Cane - single point;Shower seat;Other (comment) (transport chair)          Prior Functioning/Environment Prior Level of Function : Independent/Modified Independent             Mobility Comments: only uses cane when out in the yard ADLs Comments: helps daughter with housework        OT Problem List: Decreased strength;Impaired balance (sitting and/or  standing);Decreased range of motion;Decreased activity tolerance;Decreased cognition;Decreased coordination;Decreased safety awareness;Decreased knowledge of use of DME or AE;Impaired UE functional use;Pain;Increased edema      OT Treatment/Interventions: Self-care/ADL training;Therapeutic exercise;DME and/or AE instruction;Therapeutic activities;Cognitive remediation/compensation;Patient/family education;Balance training    OT Goals(Current goals can be found in the care plan section) Acute Rehab OT Goals Patient Stated Goal: to go home OT Goal Formulation: With patient/family Time For Goal Achievement: 07/01/22 Potential to Achieve Goals: Good  OT Frequency: Min 2X/week    Co-evaluation              AM-PAC OT "6 Clicks" Daily Activity     Outcome Measure Help from another person eating meals?: A Little Help from another person taking care of personal grooming?: A Little Help from another person toileting, which includes using toliet, bedpan, or urinal?: Total Help from another person bathing (including washing, rinsing, drying)?: A Lot Help from another person to put on and taking off regular upper body clothing?: A Little Help from another person to put on and taking off regular lower body clothing?: A Lot 6 Click Score: 14   End of Session Nurse Communication: Mobility status;Other (comment) (concerns about thumb pain)  Activity Tolerance: Patient tolerated treatment well Patient left: in bed;with call bell/phone within reach;with bed alarm set;with family/visitor present  OT Visit Diagnosis: Unsteadiness on feet (R26.81);Other abnormalities of gait and mobility (R26.89);Muscle weakness (generalized) (M62.81);History  of falling (Z91.81);Other symptoms and signs involving cognitive function;Pain Pain - Right/Left: Right Pain - part of body: Hand                Time: 2924-4628 OT Time Calculation (min): 31 min Charges:  OT General Charges $OT Visit: 1 Visit OT  Evaluation $OT Eval Moderate Complexity: 1 Mod OT Treatments $Self Care/Home Management : 8-22 mins  Maurie Boettcher, OT/L   Acute OT Clinical Specialist [place] Pager [phone] Office [phone]   [facility] 06/17/2022, 12:20 PM

## 2022-06-17 NOTE — Evaluation (Signed)
Physical Therapy Evaluation Patient Details Name: Paula Jimenez MRN: [id number] DOB: April 27, 1927 Today's Date: 06/17/2022  History of Present Illness  88 year old female presented 06/14/22 after an unwitnessed fall with loss of consciousness.  No brain bleed or ischemic CVA.  Noted seizures and remained in status epilepticus for some time prompting family to make her comfort care. However pt "woke up" 06/16/22 and family rescinded comfort care.  Clinical Impression   Pt admitted secondary to problem above with deficits below. PTA patient was living with daughter and son-in-law in a one story home with a ramp. She was independent inside the home (though acknowledges she "furniture walks"), and only used a cane when she went outdoors.  Pt currently requires min assist with ambulation without a device and anticipate minguard assist if using RW.  Anticipate patient will benefit from PT to address problems listed below.Will continue to follow acutely to maximize functional mobility independence and safety.          Recommendations for follow up therapy are one component of a multi-disciplinary discharge planning process, led by the attending physician.  Recommendations may be updated based on patient status, additional functional criteria and insurance authorization.  Follow Up Recommendations Home health PT      Assistance Recommended at Discharge Intermittent Supervision/Assistance  Patient can return home with the following  A little help with walking and/or transfers;Assistance with cooking/housework;Assist for transportation;Help with stairs or ramp for entrance    Equipment Recommendations None recommended by PT  Recommendations for Other Services  OT consult    Functional Status Assessment Patient has had a recent decline in their functional status and demonstrates the ability to make significant improvements in function in a reasonable and predictable amount of time.      Precautions / Restrictions Precautions Precautions: Fall      Mobility  Bed Mobility Overal bed mobility: Needs Assistance Bed Mobility: Supine to Sit     Supine to sit: Min guard, HOB elevated     General bed mobility comments: incr time and effort though no physical assist (near need for assist, with contact guard)    Transfers Overall transfer level: Needs assistance Equipment used: 1 person hand held assist Transfers: Sit to/from Stand Sit to Stand: Min guard           General transfer comment: felt slightly unsteady but denied dizziness    Ambulation/Gait Ambulation/Gait assistance: Min assist Gait Distance (Feet): 50 Feet Assistive device: 1 person hand held assist Gait Pattern/deviations: Step-through pattern, Decreased stride length   Gait velocity interpretation: 1.31 - 2.62 ft/sec, indicative of limited community ambulator   General Gait Details: normally does not use device; pt reaching for UE support on surfaces and provided HHA (light min assist)  Stairs            Wheelchair Mobility    Modified Rankin (Stroke Patients Only)       Balance Overall balance assessment: Mild deficits observed, not formally tested                                           Pertinent Vitals/Pain Pain Assessment Pain Assessment: Faces Faces Pain Scale: Hurts little more Pain Location: Rt thumb Pain Descriptors / Indicators: Aching, Nagging Pain Intervention(s): Limited activity within patient's tolerance, Monitored during session, Patient requesting pain meds-RN notified    Home Living Family/patient expects to be discharged to::  Private residence Living Arrangements: Children Available Help at Discharge: Family;Available 24 hours/day Type of Home: House Home Access: Ramped entrance       Home Layout: One level Home Equipment: Conservation officer, nature (2 wheels);Cane - single point;Shower seat      Prior Function Prior Level of Function :  Independent/Modified Independent             Mobility Comments: only uses cane when out in the yard ADLs Comments: helps daughter with housework     Hand Dominance        Extremity/Trunk Assessment   Upper Extremity Assessment Upper Extremity Assessment: Defer to OT evaluation    Lower Extremity Assessment Lower Extremity Assessment: Generalized weakness (bil knee extension 4/5)    Cervical / Trunk Assessment Cervical / Trunk Assessment: Normal  Communication   Communication: No difficulties  Cognition Arousal/Alertness: Awake/alert Behavior During Therapy: WFL for tasks assessed/performed Overall Cognitive Status: Within Functional Limits for tasks assessed (not specifically tested; a&ox4)                                          General Comments General comments (skin integrity, edema, etc.): son and dtr-in-law present    Exercises     Assessment/Plan    PT Assessment Patient needs continued PT services  PT Problem List Decreased strength;Decreased activity tolerance;Decreased balance;Decreased mobility;Decreased knowledge of use of DME       PT Treatment Interventions DME instruction;Gait training;Functional mobility training;Therapeutic activities;Therapeutic exercise;Balance training;Patient/family education    PT Goals (Current goals can be found in the Care Plan section)  Acute Rehab PT Goals Patient Stated Goal: get back to doing as well as she was PT Goal Formulation: With patient Time For Goal Achievement: 07/01/22 Potential to Achieve Goals: Good    Frequency Min 3X/week     Co-evaluation               AM-PAC PT "6 Clicks" Mobility  Outcome Measure Help needed turning from your back to your side while in a flat bed without using bedrails?: None Help needed moving from lying on your back to sitting on the side of a flat bed without using bedrails?: A Little Help needed moving to and from a bed to a chair (including a  wheelchair)?: A Little Help needed standing up from a chair using your arms (e.g., wheelchair or bedside chair)?: A Little Help needed to walk in hospital room?: A Little Help needed climbing 3-5 steps with a railing? : A Little 6 Click Score: 19    End of Session Equipment Utilized During Treatment: Gait belt Activity Tolerance: Patient tolerated treatment well Patient left: in chair;with call bell/phone within reach;with chair alarm set Nurse Communication: Mobility status PT Visit Diagnosis: Unsteadiness on feet (R26.81);Other abnormalities of gait and mobility (R26.89)    Time: 5621-3086 PT Time Calculation (min) (ACUTE ONLY): 23 min   Charges:   PT Evaluation $PT Eval Low Complexity: 1 Low PT Treatments $Gait Training: 8-22 mins         [place]  Office [phone]   Rexanne Mano 06/17/2022, 10:55 AM

## 2022-06-17 NOTE — Plan of Care (Signed)
  Problem: Education: Goal: Knowledge of disease or condition will improve Outcome: Progressing   Problem: Coping: Goal: Will verbalize positive feelings about self Outcome: Progressing   Problem: Nutrition: Goal: Dietary intake will improve Outcome: Progressing   Problem: Education: Goal: Knowledge of General Education information will improve Description: Including pain rating scale, medication(s)/side effects and non-pharmacologic comfort measures Outcome: Progressing   Problem: Clinical Measurements: Goal: Will remain free from infection Outcome: Progressing

## 2022-06-17 NOTE — Evaluation (Signed)
Clinical/Bedside Swallow Evaluation Patient Details  Name: Paula Jimenez MRN: [id number] Date of Birth: 14-Jun-1927  Today's Date: 06/17/2022 Time: SLP Start Time (ACUTE ONLY): 0825 SLP Stop Time (ACUTE ONLY): 0855 SLP Time Calculation (min) (ACUTE ONLY): 30 min  Past Medical History:  Past Medical History:  Diagnosis Date   Allergy    Asthma    years ago, 2-3 times a year pt experiences a problem with breathing.   Bursitis, hip    Family history of adverse reaction to anesthesia    DAUGHTER had a reaction to ansethesia   Hyperlipidemia    Hypertension    HYPERTENSION, BENIGN 10/26/2006   Qualifier: Diagnosis of  By: Selinda Orion     MITRAL VALVE PROLAPSE 02/25/2007   Qualifier: History of  By: Marcelino Scot CMA, [place]     Osteopenia 08/01   Osteoporosis    Skin cancer 2015   nose   Past Surgical History:  Past Surgical History:  Procedure Laterality Date   ABDOMINAL HYSTERECTOMY  1971   BREAST BIOPSY Right 06/99   fibrocystic   CYSTOSCOPY WITH BIOPSY N/A 05/31/2015   Procedure: CYSTOSCOPY WITH BIOPSY/ fulgeration;  Surgeon: Hollice Espy, MD;  Location: [facility] ORS;  Service: Urology;  Laterality: N/A;   DENTAL SURGERY     EYE SURGERY Bilateral 2011   June and August of 2011   ORIF RADIAL FRACTURE Left 11/99   arm fracture, radial no surgery   TONSILLECTOMY     HPI:  88 yo female adm to [facility] after fall, she was found to have seizures and was lethargic.  Family had made pt comfort care and then rescinded plan due to pt having significant mentation improvement.  Swallow eval ordered. Pt with PMH + for LD, HTN, mitral valve prolapse, melanoma, osteoporosis, asthma.  Per MD notes, she has had cough and congestion over the past week but pt did not recall this.  She admits to having problems swallowing large pills and on occasion sense particles of food stick on the right side of her throat causing her to cough.  States this has been ongoing for years.  Admits to some weight loss due  to poor appetite but not dysphagia.  [place] x1 on meat years ago.    Assessment / Plan / Recommendation  Clinical Impression  SlP greeted pt alert and uprightin bed.  Faciliated oral care by providing her with toothbrush/paste and later denture cup/cleaner - educating her to importance of oral care for health.  Patient presents with clinical indication of minimal dysphagia - which she reports is baseline. Also question new subtle facial *decreased right labial seal* and trigeminal nerve *posterior oral pocketing on right of applesauce* difficulties for which she can compensate.  Pt able to self feed and passed the 3 ounce Yale swallow screen/challenge.  Voice with mildly low amplitude - suspect presbyphonia.  Initially pt with cough and mulitple subswallows with first liquid boluses - which subsided after further intake.  Given she has h/o dysphagia to pills and sometimes meats - recommend dys3/thin diet with general precautions.  Family present and education to recommendations/precautions.  Upon their assessment of pt, they report her she is baseline. SLP Visit Diagnosis: Dysphagia, oral phase (R13.11)    Aspiration Risk  Mild aspiration risk    Diet Recommendation Dysphagia 3 (Mech soft);Thin liquid   Liquid Administration via: Cup;Straw Medication Administration: Whole meds with puree Supervision: Patient able to self feed Compensations: Slow rate;Small sips/bites Postural Changes: Seated upright at 90 degrees;Remain upright  for at least 30 minutes after po intake    Other  Recommendations Oral Care Recommendations: Oral care BID    Recommendations for follow up therapy are one component of a multi-disciplinary discharge planning process, led by the attending physician.  Recommendations may be updated based on patient status, additional functional criteria and insurance authorization.  Follow up Recommendations   N/A     Assistance Recommended at Discharge  Supervision  Functional  Status Assessment Patient has had a recent decline in their functional status and demonstrates the ability to make significant improvements in function in a reasonable and predictable amount of time.  Frequency and Duration min 1 x/week  1 week       Prognosis Prognosis for Safe Diet Advancement: Fair Barriers to Reach Goals: Time post onset      Swallow Study   General Date of Onset: 06/15/22 HPI: 88 yo female adm to [facility] after fall, she was found to have seizures and was lethargic.  Family had made pt comfort care and then rescinded plan due to pt having significant mentation improvement.  Swallow eval ordered. Pt with PMH + for LD, HTN, mitral valve prolapse, melanoma, osteoporosis, asthma.  Per MD notes, she has had cough and congestion over the past week but pt did not recall this.  She admits to having problems swallowing large pills and on occasion sense particles of food stick on the right side of her throat causing her to cough.  States this has been ongoing for years.  Admits to some weight loss due to poor appetite but not dysphagia.  [place] x1 on meat years ago. Type of Study: Bedside Swallow Evaluation Diet Prior to this Study: NPO Temperature Spikes Noted: No Respiratory Status: Room air History of Recent Intubation: No Behavior/Cognition: Alert;Cooperative;Pleasant mood Oral Cavity Assessment: Dry Oral Care Completed by SLP: Yes Oral Cavity - Dentition: Dentures, top;Dentures, bottom Vision: Functional for self-feeding Self-Feeding Abilities: Able to feed self Patient Positioning: Upright in bed Baseline Vocal Quality: Low vocal intensity Volitional Cough: Strong Volitional Swallow: Able to elicit    Oral/Motor/Sensory Function Overall Oral Motor/Sensory Function:  (very subtle right facial asymmetry with speaking that may be at baseline)   Ice Chips Ice chips: Not tested   Thin Liquid Presentation: Self Fed;Cup;Straw Other Comments: Initially pt with reflexive cough,  multiple swallows and anterior labial seal on right resulting in anterior loss - but this did not occur again after initial boluses.    Nectar Thick Nectar Thick Liquid: Within functional limits Presentation: Cup;Self Fed   Honey Thick Honey Thick Liquid: Not tested   Puree Puree: Impaired Presentation: Self Fed;Spoon Oral Phase Functional Implications: Right lateral sulci pocketing (posterior)   Solid     Solid: Within functional limits Presentation: Self Fed;Spoon Other Comments: soft canned peaches      Macario Golds 06/17/2022,9:11 AM  Kathleen Lime, MS [place] Office [phone] Pager [phone]

## 2022-06-18 ENCOUNTER — Inpatient Hospital Stay (HOSPITAL_COMMUNITY): Payer: Medicare HMO

## 2022-06-18 DIAGNOSIS — G40901 Epilepsy, unspecified, not intractable, with status epilepticus: Secondary | ICD-10-CM | POA: Diagnosis not present

## 2022-06-18 DIAGNOSIS — Z515 Encounter for palliative care: Secondary | ICD-10-CM | POA: Diagnosis not present

## 2022-06-18 DIAGNOSIS — Z66 Do not resuscitate: Secondary | ICD-10-CM | POA: Diagnosis not present

## 2022-06-18 DIAGNOSIS — R5381 Other malaise: Secondary | ICD-10-CM

## 2022-06-18 DIAGNOSIS — W19XXXA Unspecified fall, initial encounter: Secondary | ICD-10-CM | POA: Diagnosis not present

## 2022-06-18 DIAGNOSIS — Z7189 Other specified counseling: Secondary | ICD-10-CM | POA: Diagnosis not present

## 2022-06-18 LAB — BASIC METABOLIC PANEL
Anion gap: 7 (ref 5–15)
BUN: 6 mg/dL — ABNORMAL LOW (ref 8–23)
CO2: 26 mmol/L (ref 22–32)
Calcium: 7.9 mg/dL — ABNORMAL LOW (ref 8.9–10.3)
Chloride: 101 mmol/L (ref 98–111)
Creatinine, Ser: 0.47 mg/dL (ref 0.44–1.00)
GFR, Estimated: >60 mL/min (ref >60)
Glucose, Bld: 153 mg/dL — ABNORMAL HIGH (ref 70–99)
Potassium: 3.9 mmol/L (ref 3.5–5.1)
Sodium: 134 mmol/L — ABNORMAL LOW (ref 135–145)

## 2022-06-18 LAB — CBC WITH DIFFERENTIAL/PLATELET
Abs Immature Granulocytes: 0.11 10*3/uL — ABNORMAL HIGH (ref 0.00–0.07)
Basophils Absolute: 0 10*3/uL (ref 0.0–0.1)
Basophils Relative: 0 %
Eosinophils Absolute: 0.2 10*3/uL (ref 0.0–0.5)
Eosinophils Relative: 2 %
HCT: 35.8 % — ABNORMAL LOW (ref 36.0–46.0)
Hemoglobin: 12.7 g/dL (ref 12.0–15.0)
Immature Granulocytes: 2 %
Lymphocytes Relative: 12 %
Lymphs Abs: 0.9 10*3/uL (ref 0.7–4.0)
MCH: 30.8 pg (ref 26.0–34.0)
MCHC: 35.5 g/dL (ref 30.0–36.0)
MCV: 86.7 fL (ref 80.0–100.0)
Monocytes Absolute: 0.7 10*3/uL (ref 0.1–1.0)
Monocytes Relative: 10 %
Neutro Abs: 5.5 10*3/uL (ref 1.7–7.7)
Neutrophils Relative %: 74 %
Platelets: 208 10*3/uL (ref 150–400)
RBC: 4.13 MIL/uL (ref 3.87–5.11)
RDW: 12.7 % (ref 11.5–15.5)
WBC: 7.3 10*3/uL (ref 4.0–10.5)
nRBC: 0 % (ref 0.0–0.2)

## 2022-06-18 LAB — PHOSPHORUS: Phosphorus: 1.8 mg/dL — ABNORMAL LOW (ref 2.5–4.6)

## 2022-06-18 LAB — GLUCOSE, CAPILLARY: Glucose-Capillary: 110 mg/dL — ABNORMAL HIGH (ref 70–99)

## 2022-06-18 LAB — MAGNESIUM: Magnesium: 1.9 mg/dL (ref 1.7–2.4)

## 2022-06-18 MED ORDER — GUAIFENESIN-DM 100-10 MG/5ML PO SYRP
5.0000 mL | ORAL_SOLUTION | ORAL | Status: DC | PRN
  Administered 2022-06-18 – 2022-06-19 (×3): 5 mL via ORAL
  Filled 2022-06-18 (×4): qty 5

## 2022-06-18 MED ORDER — K PHOS MONO-SOD PHOS DI & MONO 155-852-130 MG PO TABS
500.0000 mg | ORAL_TABLET | Freq: Three times a day (TID) | ORAL | Status: DC
  Administered 2022-06-18 (×2): 500 mg via ORAL
  Filled 2022-06-18 (×2): qty 2

## 2022-06-18 MED ORDER — ATENOLOL 25 MG PO TABS
50.0000 mg | ORAL_TABLET | Freq: Every day | ORAL | Status: DC
  Administered 2022-06-18 – 2022-06-19 (×2): 50 mg via ORAL
  Filled 2022-06-18 (×2): qty 2

## 2022-06-18 MED ORDER — IPRATROPIUM-ALBUTEROL 0.5-2.5 (3) MG/3ML IN SOLN
3.0000 mL | Freq: Four times a day (QID) | RESPIRATORY_TRACT | Status: DC | PRN
  Administered 2022-06-19: 3 mL via RESPIRATORY_TRACT
  Filled 2022-06-18: qty 3

## 2022-06-18 NOTE — Progress Notes (Signed)
Daily Progress Note   Patient Name: Paula Jimenez       Date: 06/18/2022 DOB: 08/20/1926  Age: 88 y.o. MRN#: [id number] Attending Physician: Shelly Coss, MD Primary Care Physician: Tower, Wynelle Fanny, MD Admit Date: 06/14/2022 Length of Stay: 3 days  Reason for Consultation/Follow-up: Establishing goals of care  HPI/Patient Profile:  88 y.o. female  with past medical history of  HLD, HTN, mitral valve prolapse, melanoma, osteoporosis, asthma.  Yesterday, she complained to family about a "funny feeling" in her head and while in the bathroom, she had an unwitnessed fall with a loss of consciousness that lasted for approximately 6 minutes.  She was admitted on 06/14/2022 with possible stroke, status epilepticus, and others.    After she was seen by neurology and hospitalist service family elected comfort care due to persistent status epilepticus.  PMT was consulted for [place] conversations and comfort care.   Subjective:   Subjective: Chart Reviewed. Updates received. Patient Assessed. Created space and opportunity for patient  and family to explore thoughts and feelings regarding current medical situation.  Today's Discussion: Today I met with the patient at the bedside. She was awake, alert, oriented. She remembered me seeing her yesterday. Present at the bedside was two of the patient's sons and her granddaughter.  We celebrated that she has woken up and continues to improve from a mental status perspective.  We discussed the plan moving forward.  Initially planned seem to be SNF, but family states that talk with social worker and they have decided to take her home with family support and home health care services.  They are all in agreement with this plan.  I provided emotional and general support through therapeutic listening, empathy, sharing of stories, and other techniques. I answered all questions and addressed all concerns to the best of my ability.  Review of Systems   Constitutional:        Denies pain in general  Respiratory:  Negative for shortness of breath.   Gastrointestinal:  Negative for abdominal pain, nausea and vomiting.    Objective:   Vital Signs:  BP (!) 154/85 (BP Location: Left Arm)   Pulse (!) 106   Temp (!) 97.3 F (36.3 C) (Oral)   Resp 17   Ht _0  (1.626 m)   Wt 44.6 kg   SpO2 98%   BMI 16.88 kg/m   Physical Exam: Physical Exam Vitals and nursing note reviewed.  Constitutional:      General: She is not in acute distress.    Appearance: She is ill-appearing.  HENT:     Head: Normocephalic and atraumatic.  Cardiovascular:     Rate and Rhythm: Normal rate.  Pulmonary:     Effort: Pulmonary effort is normal. No respiratory distress.  Abdominal:     General: Abdomen is flat.     Palpations: Abdomen is soft.  Skin:    General: Skin is warm and dry.  Neurological:     General: No focal deficit present.     Mental Status: She is alert.  Psychiatric:        Mood and Affect: Mood normal.        Behavior: Behavior normal.     Palliative Assessment/Data: 40%    Existing Vynca/ACP Documentation: Goals of care document (completed 06/16/2022)  Assessment & Plan:   Impression: Present on Admission:  Stroke ([place])  HLD (hyperlipidemia)  Essential hypertension  Mitral valve prolapse  Asthma, chronic  88 year old female with chronic comorbidities and acute  presentation as described above. In short, she presented after an unwitnessed fall with loss of consciousness. No brain bleed or ischemic CVA. Noted seizures and remained in status epilepticus for some time prompting family to make her comfort care. However, overnight she woke up and became more interactive, more alert. Today she seems to be at baseline per family, alert and oriented. At this point continued Keppra but remain DNR. She has since passed her swallow eval. Plan to d/c home with home health and family support. Overall long-term prognosis guarded to  poor.   SUMMARY OF RECOMMENDATIONS   Remain DNR Continue medical management of seizures TOC consult for outpatient palliative care Plan for d/c to home with [facility] PMT will back off for now Feel free to contact us for any significant changes or new palliative needs  Symptom Management:  Per primary team PMT is available to assist as needed  Code Status: DNR  Prognosis: Unable to determine  Discharge Planning:  Home with Home Health and outpatient Palliative Care  Discussed with: Patient, patient's family, medical team, nursing team, [facility] team  Thank you for allowing [place] to participate in the care of BITANIA SHANKLAND PMT will continue to support holistically.  Billing based on MDM: High  Problems Addressed: One acute or chronic illness or injury that poses a threat to life or bodily function  Amount and/or Complexity of Data: Category 3:Discussion of management or test interpretation with external physician/other qualified health care professional/appropriate source (not separately reported)  Risks: Decision not to resuscitate or to de-escalate care because of poor prognosis (confirmed DNR)  Walden Field, NP Palliative Medicine Team  Team Phone # [phone] (Nights/Weekends)  02/19/2021, 8:17 AM

## 2022-06-18 NOTE — Progress Notes (Signed)
Physical Therapy Treatment Patient Details Name: Paula Jimenez MRN: [id number] DOB: 1926/12/31 Today's Date: 06/18/2022   History of Present Illness 88 year old female presented 06/14/22 after an unwitnessed fall with loss of consciousness.  No brain bleed or ischemic CVA.  Noted seizures and remained in status epilepticus for some time prompting family to make her comfort care. However pt "woke up" 06/16/22 and family rescinded comfort care.    PT Comments    Patient seen for gait, balance, and strength training. Patient with tendency to reach for UE support when walking and therefore utilized RW. She required instructional cues and cues for safe use (trying to lift RW up over leg of IV pole instead of going around). Able to static stand without UE support and even close eyes for 10 seconds. Family now state pt must be completely independent with getting to and using bathroom before she can be managed at home. Discharge plan updated.     Recommendations for follow up therapy are one component of a multi-disciplinary discharge planning process, led by the attending physician.  Recommendations may be updated based on patient status, additional functional criteria and insurance authorization.  Follow Up Recommendations  Skilled nursing-short term rehab (<3 hours/day) Can patient physically be transported by private vehicle: Yes   Assistance Recommended at Discharge Intermittent Supervision/Assistance  Patient can return home with the following A little help with walking and/or transfers;Assistance with cooking/housework;Assist for transportation;Help with stairs or ramp for entrance   Equipment Recommendations  None recommended by PT    Recommendations for Other Services       Precautions / Restrictions Precautions Precautions: Fall Restrictions Weight Bearing Restrictions: No     Mobility  Bed Mobility               General bed mobility comments: up in chair on arrival     Transfers Overall transfer level: Needs assistance Equipment used: Rolling walker (2 wheels) Transfers: Sit to/from Stand Sit to Stand: Min guard           General transfer comment: Min guard A for safety. Denies dizziness; from recliner x 3; from toilet    Ambulation/Gait Ambulation/Gait assistance: Min assist Gait Distance (Feet): 150 Feet Assistive device: Rolling walker (2 wheels) Gait Pattern/deviations: Step-through pattern, Decreased stride length   Gait velocity interpretation: 1.31 - 2.62 ft/sec, indicative of limited community ambulator   General Gait Details: pt required cues for proper use and proximity to RW; pt wanting to lift RW over an obstacle instead of going around it (leg of IV pole)   Stairs             Wheelchair Mobility    Modified Rankin (Stroke Patients Only)       Balance Overall balance assessment: Needs assistance Sitting-balance support: Feet supported Sitting balance-Leahy Scale: Fair     Standing balance support: Single extremity supported, No upper extremity supported, During functional activity Standing balance-Leahy Scale: Poor Standing balance comment: min guard A for all standing tasks                            Cognition Arousal/Alertness: Awake/alert Behavior During Therapy: WFL for tasks assessed/performed Overall Cognitive Status: Impaired/Different from baseline Area of Impairment: Attention, Safety/judgement, Awareness                   Current Attention Level: Selective     Safety/Judgement: Decreased awareness of deficits, Decreased awareness of safety Awareness: Emergent  General Comments: slow processing, requires increased time for problem solving. Decreased insight into deficits and safety        Exercises General Exercises - Lower Extremity Long Arc Quad: AROM, Both, 10 reps Toe Raises: AROM, Both, 10 reps (holding RW) Heel Raises: AROM, Both, 15 reps, Standing (holding  RW) Mini-Sqauts: AROM, 10 reps (no UE support) Other Exercises Other Exercises: standing feet together x 30 sec Other Exercises: standing feet apart with eyes closed x 10 sec with little to no sway    General Comments General comments (skin integrity, edema, etc.): Sons present and report they now don't think they can manage pt at home if she needs any physical assist. Sister that is primary caregiver has RA and cannot provide hands-on assistance      Pertinent Vitals/Pain Pain Assessment Pain Assessment: Faces Faces Pain Scale: Hurts little more Pain Location: Rt thumb/ MCP joint Pain Descriptors / Indicators: Aching, Nagging, Sharp Pain Intervention(s): Limited activity within patient's tolerance    Home Living                          Prior Function            PT Goals (current goals can now be found in the care plan section) Acute Rehab PT Goals Patient Stated Goal: get back to doing as well as she was Time For Goal Achievement: 07/01/22 Potential to Achieve Goals: Good Progress towards PT goals: Progressing toward goals    Frequency    Min 3X/week      PT Plan Discharge plan needs to be updated;Frequency needs to be updated    Co-evaluation              AM-PAC PT "6 Clicks" Mobility   Outcome Measure  Help needed turning from your back to your side while in a flat bed without using bedrails?: None Help needed moving from lying on your back to sitting on the side of a flat bed without using bedrails?: A Little Help needed moving to and from a bed to a chair (including a wheelchair)?: A Little Help needed standing up from a chair using your arms (e.g., wheelchair or bedside chair)?: A Little Help needed to walk in hospital room?: A Little Help needed climbing 3-5 steps with a railing? : A Little 6 Click Score: 19    End of Session Equipment Utilized During Treatment: Gait belt Activity Tolerance: Patient tolerated treatment well Patient  left: in chair;with call bell/phone within reach;with chair alarm set;with family/visitor present Nurse Communication: Mobility status PT Visit Diagnosis: Unsteadiness on feet (R26.81);Other abnormalities of gait and mobility (R26.89)     Time: 3419-3790 PT Time Calculation (min) (ACUTE ONLY): 27 min  Charges:  $Gait Training: 8-22 mins $Therapeutic Exercise: 8-22 mins                      [place]  Office [phone]    Rexanne Mano 06/18/2022, 11:28 AM

## 2022-06-18 NOTE — Progress Notes (Signed)
Occupational Therapy Treatment Patient Details Name: Paula Jimenez MRN: [id number] DOB: 1927-04-01 Today's Date: 06/18/2022   History of present illness 88 year old female presented 06/14/22 after an unwitnessed fall with loss of consciousness.  No brain bleed or ischemic CVA.  Noted seizures and remained in status epilepticus for some time prompting family to make her comfort care. However pt "woke up" 06/16/22 and family rescinded comfort care.   OT comments  Pt progressing towards established OT goals. Performing functional mobility for short distances with min guard A as well as transfers. With heavy reliance on grab bar to rise from toilet and observed to lean into sink to support self when performing grooming. Pt with frequent reaching for furniture during session, and reporting willing to use RW, but sons report poor adherence in home setting, increasing risk for falls. Pt sons present and concerned about discharge disposition and whether pt will require physical assist at home as she lives with daughter who cannot provide physical assist. Updating discharge recommendation to ST-SNF to optimize safety and independence in ADL prior to return home.    Recommendations for follow up therapy are one component of a multi-disciplinary discharge planning process, led by the attending physician.  Recommendations may be updated based on patient status, additional functional criteria and insurance authorization.    Follow Up Recommendations  Skilled nursing-short term rehab (<3 hours/day)     Assistance Recommended at Discharge Frequent or constant Supervision/Assistance  Patient can return home with the following  A little help with walking and/or transfers;A little help with bathing/dressing/bathroom;Assistance with cooking/housework;Direct supervision/assist for medications management;Direct supervision/assist for financial management;Assist for transportation;Help with stairs or ramp for  entrance   Equipment Recommendations  None recommended by OT    Recommendations for Other Services      Precautions / Restrictions Precautions Precautions: Fall Restrictions Weight Bearing Restrictions: No       Mobility Bed Mobility Overal bed mobility: Needs Assistance Bed Mobility: Supine to Sit     Supine to sit: Supervision, HOB elevated     General bed mobility comments: Incrreased time and effort    Transfers Overall transfer level: Needs assistance Equipment used: 1 person hand held assist, None Transfers: Sit to/from Stand Sit to Stand: Min guard           General transfer comment: Min guard A for safety. Denies dizziness     Balance Overall balance assessment: Needs assistance Sitting-balance support: Feet supported Sitting balance-Leahy Scale: Fair     Standing balance support: Single extremity supported, No upper extremity supported, During functional activity Standing balance-Leahy Scale: Poor Standing balance comment: min guard A for all standing tasks                           ADL either performed or assessed with clinical judgement   ADL Overall ADL's : Needs assistance/impaired     Grooming: Supervision/safety;Min guard;Standing;Wash/dry face;Brushing hair Grooming Details (indicate cue type and reason): Pt observed to lean into sink with hips for balance support.                 Toilet Transfer: Min guard;Ambulation;Regular Toilet;Grab bars Toilet Transfer Details (indicate cue type and reason): Heavy reliance on grab bar for rise; min guard A for safety Toileting- Clothing Manipulation and Hygiene: Min guard;Sit to/from stand Toileting - Clothing Manipulation Details (indicate cue type and reason): Continuing to hold grab bar during anterior pericare. Pt initially not wanting to wipe due to "  it was just a little dribble" could benefit from continued education regarding hygiene     Functional mobility during ADLs: Min  guard (Frequently attempting to furnirture surf) General ADL Comments: decresaed activity tolerance.    Extremity/Trunk Assessment Upper Extremity Assessment Upper Extremity Assessment: RUE deficits/detail RUE Deficits / Details: R thumb pain and limited mobility,especially @ MP and CMC joint, affecting ability to use hand functionally for self feeidngand ADL RUE Coordination: decreased fine motor   Lower Extremity Assessment Lower Extremity Assessment: Defer to PT evaluation        Vision   Vision Assessment?: No apparent visual deficits Additional Comments: appears at baseline and pt denying changes   Perception     Praxis      Cognition Arousal/Alertness: Awake/alert Behavior During Therapy: WFL for tasks assessed/performed Overall Cognitive Status: Impaired/Different from baseline Area of Impairment: Attention, Safety/judgement, Awareness                   Current Attention Level: Selective     Safety/Judgement: Decreased awareness of deficits, Decreased awareness of safety Awareness: Emergent   General Comments: slow processing, requires increased time for problem solving. Decreased insight into deficits and safety        Exercises Exercises: Other exercises Other Exercises Other Exercises: STS x3 Other Exercises: chair push ups x4    Shoulder Instructions       General Comments Sons present and supportive. HR max of 117    Pertinent Vitals/ Pain       Pain Assessment Pain Assessment: Faces Faces Pain Scale: Hurts little more Pain Location: Rt thumb/ MCP joint Pain Descriptors / Indicators: Aching, Nagging, Sharp Pain Intervention(s): Limited activity within patient's tolerance  Home Living                                          Prior Functioning/Environment              Frequency  Min 2X/week        Progress Toward Goals  OT Goals(current goals can now be found in the care plan section)  Progress towards  OT goals: Progressing toward goals  Acute Rehab OT Goals Patient Stated Goal: get stronger OT Goal Formulation: With patient/family Time For Goal Achievement: 07/01/22 Potential to Achieve Goals: Good ADL Goals Pt Will Perform Lower Body Bathing: with min guard assist;sit to/from stand Pt Will Perform Lower Body Dressing: with min guard assist;sit to/from stand Pt Will Transfer to Toilet: with supervision;ambulating Pt Will Perform Toileting - Clothing Manipulation and hygiene: with min assist;sit to/from stand Additional ADL Goal #1: Pt/family will independently verbalize 3 strategies to reduce risk of falls.  Plan Discharge plan needs to be updated;Frequency remains appropriate    Co-evaluation                 AM-PAC OT "6 Clicks" Daily Activity     Outcome Measure   Help from another person eating meals?: A Little Help from another person taking care of personal grooming?: A Little Help from another person toileting, which includes using toliet, bedpan, or urinal?: A Lot Help from another person bathing (including washing, rinsing, drying)?: A Little Help from another person to put on and taking off regular upper body clothing?: A Little Help from another person to put on and taking off regular lower body clothing?: A Little 6 Click Score: 17  End of Session Equipment Utilized During Treatment: Gait belt  OT Visit Diagnosis: Unsteadiness on feet (R26.81);Other abnormalities of gait and mobility (R26.89);Muscle weakness (generalized) (M62.81);History of falling (Z91.81);Other symptoms and signs involving cognitive function;Pain Pain - Right/Left: Right Pain - part of body: Hand   Activity Tolerance Patient tolerated treatment well   Patient Left in chair;with call bell/phone within reach;with chair alarm set;with family/visitor present   Nurse Communication Mobility status        Time: 7445-1460 OT Time Calculation (min): 34 min  Charges: OT General  Charges $OT Visit: 1 Visit OT Treatments $Self Care/Home Management : 23-37 mins  Elder Cyphers, OTR/L [facility] Acute Rehabilitation Office: [phone]   Magnus Ivan 06/18/2022, 11:15 AM

## 2022-06-18 NOTE — Plan of Care (Signed)
  Problem: Education: Goal: Knowledge of disease or condition will improve Outcome: Progressing   Problem: Ischemic Stroke/TIA Tissue Perfusion: Goal: Complications of ischemic stroke/TIA will be minimized Outcome: Progressing   Problem: Education: Goal: Knowledge of General Education information will improve Description: Including pain rating scale, medication(s)/side effects and non-pharmacologic comfort measures Outcome: Progressing   Problem: Health Behavior/Discharge Planning: Goal: Ability to manage health-related needs will improve Outcome: Progressing   Problem: Activity: Goal: Risk for activity intolerance will decrease Outcome: Progressing   Problem: Safety: Goal: Ability to remain free from injury will improve Outcome: Progressing

## 2022-06-18 NOTE — Progress Notes (Signed)
PROGRESS NOTE  Paula Jimenez  CBS:[id number] DOB: [birth year] DOA: 06/14/2022 PCP: Abner Greenspan, MD   Brief Narrative: Patient is a 88 year old female with past medical's of hypertension, hyperlipidemia, mitral  valve prolapse, normal, osteoporosis who presented with cough, congestion, unwitnessed fall at bathroom at home, loss of consciousness.  Initial suspicion with stroke.  Neurology consulted.  LTM EEG was concerning for status epilepticus, started on Keppra.  Plan of care was also involved for goals of care discussion.  CODE STATUS changed to DNR.  PT/OT recommending SNF on discharge.  Family wants her home with home health.  Plan is to discharge her tomorrow to home health if remains  stable,outpatient palliative care follow-up.  Assessment & Plan:  Principal Problem:   Stroke [facility]) Active Problems:   HLD (hyperlipidemia)   Essential hypertension   Asthma, chronic   Mitral valve prolapse   Physical deconditioning   Status epilepticus: Presented with loss of consciousness after a fall.  EEG with evidence of status epilepticus.  Neurology was following.  Continue Keppra.  Currently remains alert and oriented.  She should follow-up with neurology in 4 weeks Stroke ruled out  Dysphagia: Currently on dysphagia 3 diet  Hypertension: Continue atenolol, hypotensive this morning.  Right hand pain: X-ray negative for fracture.  Hypokalemia/hypomagnesemia/hypophosphatemia: Being supplemented and being monitored.  Cough: Unclear etiology.  She is saturating fine on room air.  Will get a chest x-ray  Physical deconditioning: PT/OT evaluated the patient and recommended SNF on discharge.  Family and patient wants to go home with home health.  Goals of care: Extensive goals of care discussed with the family.   Palliative care was following.  Current plan is to discharge to home with outpatient palliative care follow-up.        DVT prophylaxis:None     Code Status: DNR  Family  Communication: Family at bedside  Patient status:Inpatient  Patient is from :Home  Anticipated discharge WG:YKZL  Estimated DC date:tomorrow   Consultants: Neurology, palliative care  Procedures: EEG  Antimicrobials:  Anti-infectives (From admission, onward)    None       Subjective: Patient seen and examined at bedside today.  Hemodynamically stable.  Lying in bed.  Family at bedside.  Family told me that they want her to go home.  She looks comfortable, denies any worsening shortness of breath, abdominal pain or chest pain.  Has some cough.  Objective: Vitals:   06/18/22 0332 06/18/22 0521 06/18/22 0826 06/18/22 1231  BP: (!) 163/84 131/87 (!) 165/82 (!) 154/85  Pulse: (!) 102  (!) 101 (!) 106  Resp: '16 16 19 17  '$ Temp: 98.4 F (36.9 C)  98.3 F (36.8 C) (!) 97.3 F (36.3 C)  TempSrc: Oral  Oral Oral  SpO2: 93%  94% 98%  Weight:      Height:        Intake/Output Summary (Last 24 hours) at 06/18/2022 1238 Last data filed at 06/17/2022 1300 Gross per 24 hour  Intake 240 ml  Output --  Net 240 ml   Filed Weights   06/14/22 1400  Weight: 44.6 kg    Examination:  General exam: Overall comfortable, not in distress, pleasant elderly female HEENT: PERRL Respiratory system: Mildly diminished air sounds   bilaterally, few wheezes heard ,no crackles  Cardiovascular system: S1 & S2 heard, RRR.  Gastrointestinal system: Abdomen is nondistended, soft and nontender. Central nervous system: Alert and oriented Extremities: No edema, no clubbing ,no cyanosis Skin: No rashes, no ulcers,no  icterus     Data Reviewed: I have personally reviewed following labs and imaging studies  CBC: Recent Labs  Lab 06/14/22 1425 06/14/22 1431 06/14/22 2045 06/17/22 0417 06/18/22 0045  WBC 8.5  --  8.8 9.0 7.3  NEUTROABS 4.6  --   --  7.1 5.5  HGB 13.7 13.9 14.2 12.9 12.7  HCT 40.6 41.0 40.7 38.0 35.8*  MCV 91.0  --  87.9 88.0 86.7  PLT 241  --  225 224 812   Basic  Metabolic Panel: Recent Labs  Lab 06/14/22 1425 06/14/22 1431 06/14/22 2045 06/16/22 1541 06/17/22 0417 06/18/22 0045  NA 140 135  --  129* 132*  --   K 3.1* 3.0*  --  3.1* 2.9*  --   CL 95* 96*  --  91* 96*  --   CO2 22  --   --  23 25  --   GLUCOSE 133* 130*  --  81 63*  --   BUN 13 13  --  9 12  --   CREATININE 0.64 0.50 0.48 0.55 0.62  --   CALCIUM 7.7*  --   --  8.3* 8.0*  --   MG  --   --   --   --  1.6* 1.9  PHOS  --   --   --   --  3.3 1.8*     Recent Results (from the past 240 hour(s))  Culture, blood (Routine X 2) w Reflex to ID Panel     Status: None (Preliminary result)   Collection Time: 06/14/22  8:45 PM   Specimen: BLOOD  Result Value Ref Range Status   Specimen Description BLOOD SITE NOT SPECIFIED  Final   Special Requests   Final    BOTTLES DRAWN AEROBIC AND ANAEROBIC Blood Culture results may not be optimal due to an excessive volume of blood received in culture bottles   Culture   Final    NO GROWTH 3 DAYS Performed at [facility], [address] [place], [place] [address]    Report Status PENDING  Incomplete  Urine Culture     Status: Abnormal   Collection Time: 06/14/22  8:45 PM   Specimen: Urine, Clean Catch  Result Value Ref Range Status   Specimen Description URINE, CLEAN CATCH  Final   Special Requests   Final    NONE Performed at [facility], [place] [address] [place], [place] [address]    Culture 80,000 COLONIES/mL ESCHERICHIA COLI (A)  Final   Report Status 06/17/2022 FINAL  Final   Organism ID, Bacteria ESCHERICHIA COLI (A)  Final      Susceptibility   Escherichia coli - MIC*    AMPICILLIN <=2 SENSITIVE Sensitive     CEFAZOLIN <=4 SENSITIVE Sensitive     CEFEPIME <=0.12 SENSITIVE Sensitive     CEFTRIAXONE <=0.25 SENSITIVE Sensitive     CIPROFLOXACIN <=0.25 SENSITIVE Sensitive     GENTAMICIN <=1 SENSITIVE Sensitive     IMIPENEM <=0.25 SENSITIVE Sensitive     NITROFURANTOIN <=16 SENSITIVE Sensitive     TRIMETH/SULFA <=20  SENSITIVE Sensitive     AMPICILLIN/SULBACTAM <=2 SENSITIVE Sensitive     PIP/TAZO <=4 SENSITIVE Sensitive     * 80,000 COLONIES/mL ESCHERICHIA COLI     Radiology Studies: DG Hand 2 View Right  Result Date: 06/17/2022 CLINICAL DATA:  Right thumb pain.  Recent fall. EXAM: RIGHT HAND - 2 VIEW COMPARISON:  None Available. FINDINGS: No evidence of acute  fracture. No dislocation. 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 No erosion. Bones are demineralized. There is soft tissue swelling about the thumb. IMPRESSION: Soft tissue swelling about the thumb without acute fracture or dislocation. Electronically Signed   By: Davina Poke D.O.   On: 06/17/2022 14:58    Scheduled Meds:  levETIRAcetam  500 mg Oral BID   metoprolol tartrate  5 mg Intravenous Q8H   Continuous Infusions:  sodium chloride 50 mL/hr at 06/18/22 0523     LOS: 3 days   Shelly Coss, MD [facility] P12/27/2023, 12:38 PM

## 2022-06-18 NOTE — NC FL2 (Signed)
[place] LEVEL OF CARE FORM     IDENTIFICATION  Patient Name: Paula Jimenez Birthdate: September 29, 1926 Sex: female Admission Date (Current Location): 06/14/2022  [facility] and [place] Number:  Herbalist and Address:  The . [facility], [place] [address], [place], [place] [address]      Provider Number: [id number]  Attending Physician Name and Address:  Shelly Coss, MD  Relative Name and Phone Number:       Current Level of Care: Hospital Recommended Level of Care: [place] Prior Approval Number:    Date Approved/Denied:   PASRR Number: [id number]  Discharge Plan: SNF    Current Diagnoses: Patient Active Problem List   Diagnosis Date Noted   Physical deconditioning 06/17/2022   Stroke ([place]) 06/14/2022   Mitral valve prolapse 06/14/2022   Transportation insecurity 05/26/2022   Underweight due to inadequate caloric intake 05/26/2022   Meningioma ([place]) 04/21/2022   Generalized weakness 04/21/2022   Falls frequently 04/07/2022   Fall at home 04/07/2022   Insect bite of back 09/30/2021   Varicose vein of leg 10/18/2020   Pelvic pressure in female 04/19/2020   Fatigue 05/30/2019   Onychomycosis of toenail 03/02/2019   Urethral caruncle 12/10/2018   Elevated random blood glucose level 08/25/2018   Medicare annual wellness visit, subsequent 08/25/2018   Loose stools 06/28/2018   Internal hemorrhoid, bleeding 04/20/2018   Corn of toe 04/20/2018   Ingrown nail of great toe of right foot 11/05/2017   Frequent UTI 02/16/2015   Epistaxis, recurrent 02/28/2014   History of shingles 02/20/2014   Dizziness 01/06/2014   Hearing loss 09/23/2007   History of melanoma 02/25/2007   HLD (hyperlipidemia) 02/25/2007   MITRAL VALVE PROLAPSE 02/25/2007   Allergic rhinitis 02/25/2007   Asthma, chronic 02/25/2007   DEGENERATIVE DISC DISEASE, LUMBOSACRAL SPINE 02/25/2007   Age related osteoporosis 02/25/2007   Essential  hypertension 10/26/2006    Orientation RESPIRATION BLADDER Height & Weight     Self, Time, Place  Normal Incontinent Weight: 44.6 kg Height:  '5\' 4"'$  (162.6 cm)  BEHAVIORAL SYMPTOMS/MOOD NEUROLOGICAL BOWEL NUTRITION STATUS      Continent Diet (dysphagia 3 with thin liquids)  AMBULATORY STATUS COMMUNICATION OF NEEDS Skin   Limited Assist Verbally Normal                       Personal Care Assistance Level of Assistance  Bathing, Feeding, Dressing Bathing Assistance: Limited assistance Feeding assistance: Independent Dressing Assistance: Limited assistance     Functional Limitations Info  Sight, Hearing, Speech Sight Info: Impaired Hearing Info: Adequate Speech Info: Adequate    SPECIAL CARE FACTORS FREQUENCY  PT (By licensed PT), OT (By licensed OT), Speech therapy     PT Frequency: 5x/wk OT Frequency: 5x/wk     Speech Therapy Frequency: 5x/wk      Contractures Contractures Info: Not present    Additional Factors Info  Code Status, Allergies, Psychotropic Code Status Info: DNR Allergies Info: Clindamycin/lincomycin  Codeine  Keflex (Cephalexin)  Levofloxacin  Sulfa Antibiotics  Valtrex (Valacyclovir Hcl)  Vioxx (Rofecoxib)  Adhesive (Tape)  Evista (Raloxifene)  Macrobid (Nitrofurantoin)  Penicillins  Ultram (Tramadol) Psychotropic Info: Keppra '100mg'$ / ml  500 mg BID         Current Medications (06/18/2022):  This is the current hospital active medication list Current Facility-Administered Medications  Medication Dose Route Frequency Provider Last Rate Last Admin   acetaminophen (TYLENOL) tablet 650 mg  650 mg  Oral Q6H PRN Allie Bossier, MD       Or   acetaminophen (TYLENOL) suppository 650 mg  650 mg Rectal Q6H PRN Allie Bossier, MD       antiseptic oral rinse (BIOTENE) solution 15 mL  15 mL Topical PRN Allie Bossier, MD       atenolol (TENORMIN) tablet 50 mg  50 mg Oral Daily Adhikari, Tamsen Meek, MD       diphenhydrAMINE (BENADRYL) injection 12.5 mg  12.5  mg Intravenous Q6H PRN Allie Bossier, MD       glycopyrrolate (ROBINUL) tablet 1 mg  1 mg Oral Q4H PRN Allie Bossier, MD       Or   glycopyrrolate (ROBINUL) injection 0.2 mg  0.2 mg Subcutaneous Q4H PRN Allie Bossier, MD       Or   glycopyrrolate (ROBINUL) injection 0.2 mg  0.2 mg Intravenous Q4H PRN Allie Bossier, MD       guaiFENesin-dextromethorphan (ROBITUSSIN DM) 100-10 MG/5ML syrup 5 mL  5 mL Oral Q4H PRN Lang Snow, NP   5 mL at 06/18/22 0906   hydrALAZINE (APRESOLINE) injection 5 mg  5 mg Intravenous Q6H PRN Allie Bossier, MD       levETIRAcetam (KEPPRA) 100 MG/ML solution 500 mg  500 mg Oral BID Allie Bossier, MD   500 mg at 06/18/22 0902   LORazepam (ATIVAN) injection 1-2 mg  1-2 mg Intramuscular PRN Allie Bossier, MD       morphine (PF) 2 MG/ML injection 1 mg  1 mg Intravenous Q6H PRN Allie Bossier, MD   1 mg at 06/16/22 0257   Muscle Rub CREA   Topical BID PRN Reome, Craig Guess, RPH   Given at 06/18/22 0902   ondansetron (ZOFRAN-ODT) disintegrating tablet 4 mg  4 mg Oral Q6H PRN Allie Bossier, MD       Or   ondansetron [facility]) injection 4 mg  4 mg Intravenous Q6H PRN Allie Bossier, MD       polyvinyl alcohol (LIQUIFILM TEARS) 1.4 % ophthalmic solution 1 drop  1 drop Both Eyes QID PRN Allie Bossier, MD         Discharge Medications: Please see discharge summary for a list of discharge medications.  Relevant Imaging Results:  Relevant Lab Results:   Additional Information SS#: [id number]  Pollie Friar, RN

## 2022-06-18 NOTE — Progress Notes (Signed)
Pt c/o sudden onset of congestion and non productive cough. Stark Klein, NP notified. Denied SOB, no fever, lung sounds clear in upper lobes and diminished in lower lobes.

## 2022-06-18 NOTE — TOC Progression Note (Signed)
Transition of Care [facility]) - Progression Note    Patient Details  Name: Paula Jimenez MRN: [id number] Date of Birth: [birth year]  Transition of Care [facility]) CM/SW Contact  Pollie Friar, RN Phone Number: 06/18/2022, 1:19 PM  Clinical Narrative:    Therapies have recommended SNF rehab. CM talked with the patient and her family and they prefer she d/c home with resumption of [place] services. The family is going  to pull together to assist at home.  Pt has [facility] already for [facility]. CM will update the [facility] agency when she is ready for d/c.  TOC following.   Expected Discharge Plan: [place] Barriers to Discharge: Continued Medical Work up  Expected Discharge Plan and Services   Discharge Planning Services: CM Consult Post Acute Care Choice: [place] arrangements for the past 2 months: Single Family Home                           HH Arranged: PT, OT, Social Work [facility] Agency: [facility] [place] Date [place]: 06/17/22   Representative spoke with at [place]: [place] ([place]) Interventions SDOH Screenings   Food Insecurity: No Food Insecurity (06/15/2022)  Housing: Low Risk  (06/15/2022)  Transportation Needs: No Transportation Needs (06/15/2022)  Utilities: Not At Risk (06/15/2022)  Alcohol Screen: Low Risk  (11/26/2020)  Depression (PHQ2-9): Low Risk  (12/18/2021)  Financial Resource Strain: Low Risk  (12/18/2021)  Physical Activity: Inactive (12/18/2021)  Stress: No Stress Concern Present (12/18/2021)  Tobacco Use: Low Risk  (06/15/2022)    Readmission Risk Interventions     No data to display

## 2022-06-19 DIAGNOSIS — I639 Cerebral infarction, unspecified: Secondary | ICD-10-CM | POA: Diagnosis not present

## 2022-06-19 LAB — CBC WITH DIFFERENTIAL/PLATELET
Abs Immature Granulocytes: 0.07 10*3/uL (ref 0.00–0.07)
Basophils Absolute: 0 10*3/uL (ref 0.0–0.1)
Basophils Relative: 0 %
Eosinophils Absolute: 0.1 10*3/uL (ref 0.0–0.5)
Eosinophils Relative: 2 %
HCT: 33.4 % — ABNORMAL LOW (ref 36.0–46.0)
Hemoglobin: 11.6 g/dL — ABNORMAL LOW (ref 12.0–15.0)
Immature Granulocytes: 2 %
Lymphocytes Relative: 15 %
Lymphs Abs: 0.7 10*3/uL (ref 0.7–4.0)
MCH: 30.3 pg (ref 26.0–34.0)
MCHC: 34.7 g/dL (ref 30.0–36.0)
MCV: 87.2 fL (ref 80.0–100.0)
Monocytes Absolute: 0.7 10*3/uL (ref 0.1–1.0)
Monocytes Relative: 14 %
Neutro Abs: 3.1 10*3/uL (ref 1.7–7.7)
Neutrophils Relative %: 67 %
Platelets: 185 10*3/uL (ref 150–400)
RBC: 3.83 MIL/uL — ABNORMAL LOW (ref 3.87–5.11)
RDW: 12.9 % (ref 11.5–15.5)
WBC: 4.7 10*3/uL (ref 4.0–10.5)
nRBC: 0 % (ref 0.0–0.2)

## 2022-06-19 LAB — MAGNESIUM: Magnesium: 1.5 mg/dL — ABNORMAL LOW (ref 1.7–2.4)

## 2022-06-19 LAB — BASIC METABOLIC PANEL
Anion gap: 7 (ref 5–15)
BUN: <5 mg/dL — ABNORMAL LOW (ref 8–23)
CO2: 30 mmol/L (ref 22–32)
Calcium: 7.8 mg/dL — ABNORMAL LOW (ref 8.9–10.3)
Chloride: 97 mmol/L — ABNORMAL LOW (ref 98–111)
Creatinine, Ser: 0.4 mg/dL — ABNORMAL LOW (ref 0.44–1.00)
GFR, Estimated: >60 mL/min (ref >60)
Glucose, Bld: 111 mg/dL — ABNORMAL HIGH (ref 70–99)
Potassium: 3.4 mmol/L — ABNORMAL LOW (ref 3.5–5.1)
Sodium: 134 mmol/L — ABNORMAL LOW (ref 135–145)

## 2022-06-19 LAB — CULTURE, BLOOD (ROUTINE X 2): Culture: NO GROWTH

## 2022-06-19 LAB — PHOSPHORUS: Phosphorus: 3 mg/dL (ref 2.5–4.6)

## 2022-06-19 MED ORDER — ALBUTEROL SULFATE HFA 108 (90 BASE) MCG/ACT IN AERS: 2.0000 | INHALATION_SPRAY | Freq: Four times a day (QID) | RESPIRATORY_TRACT | 1 refills | Status: DC | PRN

## 2022-06-19 MED ORDER — MAGNESIUM OXIDE -MG SUPPLEMENT 400 (240 MG) MG PO TABS: 400.0000 mg | ORAL_TABLET | Freq: Every day | ORAL | 0 refills | Status: AC

## 2022-06-19 MED ORDER — LEVETIRACETAM 500 MG PO TABS: 500.0000 mg | ORAL_TABLET | Freq: Two times a day (BID) | ORAL | 1 refills | Status: DC

## 2022-06-19 MED ORDER — POTASSIUM CHLORIDE CRYS ER 20 MEQ PO TBCR: 40.0000 meq | EXTENDED_RELEASE_TABLET | Freq: Every day | ORAL | 0 refills | Status: DC

## 2022-06-19 MED ORDER — MAGNESIUM OXIDE -MG SUPPLEMENT 400 (240 MG) MG PO TABS: 400.0000 mg | ORAL_TABLET | Freq: Every day | ORAL | Status: DC

## 2022-06-19 MED ORDER — MAGNESIUM SULFATE 2 GM/50ML IV SOLN
2.0000 g | Freq: Once | INTRAVENOUS | Status: AC
  Administered 2022-06-19: 2 g via INTRAVENOUS
  Filled 2022-06-19: qty 50

## 2022-06-19 MED ORDER — POTASSIUM CHLORIDE CRYS ER 20 MEQ PO TBCR
40.0000 meq | EXTENDED_RELEASE_TABLET | Freq: Every day | ORAL | Status: DC
  Administered 2022-06-19: 40 meq via ORAL
  Filled 2022-06-19: qty 2

## 2022-06-19 NOTE — Progress Notes (Signed)
Speech Language Pathology Treatment: Dysphagia  Patient Details Name: Paula Jimenez MRN: [id number] DOB: 10-20-1926 Today's Date: 06/19/2022 Time: 2902-1115 SLP Time Calculation (min) (ACUTE ONLY): 17 min  Assessment / Plan / Recommendation Clinical Impression  Pt alert and pleasant upon SLP arrival, sons present at bedside. Pt reports consuming a regular diet at PLOF without difficulties. She also has baseline cough per sons. Pt self-fed thin liquids in consecutive swallows by straw, bites of mechanical soft/mixed consistency and regular solids with x2 brief coughing after bite of solids. Given this was not a consistent presentation and pt denied any difficulties with solids, suspect cough was unrelated to POs. Oral clearance of solids was full, which is an improvement from initial swallow eval. Thin liquids in large quantities were without signs of aspiration. Recommend regular diet/thin liquids with SLP to f/u briefly for tolerance.    HPI HPI: 88 yo female adm to [facility] after fall, she was found to have seizures and was lethargic.  Family had made pt comfort care and then rescinded plan due to pt having significant mentation improvement.  Swallow eval ordered. Pt with PMH + for LD, HTN, mitral valve prolapse, melanoma, osteoporosis, asthma.  Per MD notes, she has had cough and congestion over the past week but pt did not recall this.  She admits to having problems swallowing large pills and on occasion sense particles of food stick on the right side of her throat causing her to cough.  States this has been ongoing for years.  Admits to some weight loss due to poor appetite but not dysphagia.  [place] x1 on meat years ago.      SLP Plan  Continue with current plan of care      Recommendations for follow up therapy are one component of a multi-disciplinary discharge planning process, led by the attending physician.  Recommendations may be updated based on patient status, additional functional  criteria and insurance authorization.    Recommendations  Diet recommendations: Regular;Thin liquid Liquids provided via: Cup;Straw Medication Administration: Whole meds with puree (or whole with water as tolerated) Supervision: Patient able to self feed;Intermittent supervision to cue for compensatory strategies Compensations: Minimize environmental distractions;Slow rate;Small sips/bites Postural Changes and/or Swallow Maneuvers: Seated upright 90 degrees                Oral Care Recommendations: Oral care BID Follow Up Recommendations: No SLP follow up Assistance recommended at discharge: Set up Supervision/Assistance SLP Visit Diagnosis: Dysphagia, oral phase (R13.11) Plan: Continue with current plan of care            [place], [place], [place] Office Number: [place]  06/19/2022, 11:18 AM

## 2022-06-19 NOTE — TOC Transition Note (Signed)
Transition of Care [facility]) - CM/SW Discharge Note   Patient Details  Name: GEORGEANNA RADZIEWICZ MRN: [id number] Date of Birth: [birth year]  Transition of Care [facility]) CM/SW Contact:  Pollie Friar, RN Phone Number: 06/19/2022, 12:36 PM   Clinical Narrative:    Pt is discharging home with home health through [facility] home health. Information on the AVS.  Pt has needed DME at home.  CM met with the patient and her sons and provided choice for palliative care as outpt. [facility] decided on. Information on the AVS.  Family to provide transport home.   Final next level of care: Home w Home Health Services Barriers to Discharge: No Barriers Identified   Patient Goals and CMS Choice CMS Medicare.gov Compare Post Acute Care list provided to:: Patient Represenative (must comment) Choice offered to / list presented to : Adult Children  Discharge Placement                         Discharge Plan and Services Additional resources added to the After Visit Summary for     Discharge Planning Services: CM Consult Post Acute Care Choice: Home Health                    HH Arranged: PT, OT, Social Work [facility] Agency: [facility] Date [employer] Agency Contacted: 06/17/22   Representative spoke with at [place]: [place] ([place]) Interventions SDOH Screenings   Food Insecurity: No Food Insecurity (06/15/2022)  Housing: Low Risk  (06/15/2022)  Transportation Needs: No Transportation Needs (06/15/2022)  Utilities: Not At Risk (06/15/2022)  Alcohol Screen: Low Risk  (11/26/2020)  Depression (PHQ2-9): Low Risk  (12/18/2021)  Financial Resource Strain: Low Risk  (12/18/2021)  Physical Activity: Inactive (12/18/2021)  Stress: No Stress Concern Present (12/18/2021)  Tobacco Use: Low Risk  (06/15/2022)     Readmission Risk Interventions     No data to display

## 2022-06-19 NOTE — Plan of Care (Signed)
  Problem: Education: Goal: Knowledge of disease or condition will improve Outcome: Progressing Goal: Knowledge of secondary prevention will improve (MUST DOCUMENT ALL) Outcome: Progressing Goal: Knowledge of patient specific risk factors will improve [place] N/A or DELETE if not current risk factor) Outcome: Progressing   Problem: Ischemic Stroke/TIA Tissue Perfusion: Goal: Complications of ischemic stroke/TIA will be minimized Outcome: Progressing   Problem: Coping: Goal: Will verbalize positive feelings about self Outcome: Progressing   Problem: Health Behavior/Discharge Planning: Goal: Ability to manage health-related needs will improve Outcome: Progressing   Problem: Self-Care: Goal: Ability to participate in self-care as condition permits will improve Outcome: Progressing Goal: Verbalization of feelings and concerns over difficulty with self-care will improve Outcome: Progressing   Problem: Nutrition: Goal: Risk of aspiration will decrease Outcome: Progressing Goal: Dietary intake will improve Outcome: Progressing   Problem: Education: Goal: Knowledge of General Education information will improve Description: Including pain rating scale, medication(s)/side effects and non-pharmacologic comfort measures Outcome: Progressing   Problem: Health Behavior/Discharge Planning: Goal: Ability to manage health-related needs will improve Outcome: Progressing   Problem: Clinical Measurements: Goal: Ability to maintain clinical measurements within normal limits will improve Outcome: Progressing Goal: Will remain free from infection Outcome: Progressing Goal: Diagnostic test results will improve Outcome: Progressing Goal: Respiratory complications will improve Outcome: Progressing Goal: Cardiovascular complication will be avoided Outcome: Progressing   Problem: Activity: Goal: Risk for activity intolerance will decrease Outcome: Progressing   Problem: Nutrition: Goal:  Adequate nutrition will be maintained Outcome: Progressing   Problem: Coping: Goal: Level of anxiety will decrease Outcome: Progressing   Problem: Pain Managment: Goal: General experience of comfort will improve Outcome: Progressing   Problem: Safety: Goal: Ability to remain free from injury will improve Outcome: Progressing   Problem: Skin Integrity: Goal: Risk for impaired skin integrity will decrease Outcome: Progressing   Problem: Education: Goal: Knowledge of the prescribed therapeutic regimen will improve Outcome: Progressing   Problem: Coping: Goal: Ability to identify and develop effective coping behavior will improve Outcome: Progressing   Problem: Clinical Measurements: Goal: Quality of life will improve Outcome: Progressing   Problem: Respiratory: Goal: Verbalizations of increased ease of respirations will increase Outcome: Progressing   Problem: Role Relationship: Goal: Family's ability to cope with current situation will improve Outcome: Progressing Goal: Ability to verbalize concerns, feelings, and thoughts to partner or family member will improve Outcome: Progressing   Problem: Coping: Goal: Will identify appropriate support needs Outcome: Completed/Met   Problem: Health Behavior/Discharge Planning: Goal: Goals will be collaboratively established with patient/family Outcome: Completed/Met   Problem: Self-Care: Goal: Ability to communicate needs accurately will improve Outcome: Completed/Met   Problem: Elimination: Goal: Will not experience complications related to bowel motility Outcome: Completed/Met Goal: Will not experience complications related to urinary retention Outcome: Completed/Met   Problem: Safety: Goal: Non-violent Restraint(s) Outcome: Not Applicable   Problem: Pain Management: Goal: Satisfaction with pain management regimen will improve Outcome: Not Applicable

## 2022-06-19 NOTE — Discharge Summary (Signed)
Physician Discharge Summary  Paula Jimenez:[id number] DOB: 1926/12/23 DOA: 06/14/2022  PCP: Abner Greenspan, MD  Admit date: 06/14/2022 Discharge date: 06/19/2022  Admitted From: Home Disposition:  Home  Discharge Condition:Stable CODE STATUS:DNR Diet recommendation: Dysphagia 3 diet  Brief/Interim Summary: Patient is a 88 year old female with past medical's of hypertension, hyperlipidemia, mitral  valve prolapse, normal, osteoporosis who presented with cough, congestion, unwitnessed fall at bathroom at home, loss of consciousness.  Initial suspicion with stroke.  Neurology consulted.  LTM EEG was concerning for status epilepticus, started on Keppra.  Palliative care was also involved for goals of care discussion.  CODE STATUS changed to DNR.  Her mental status significantly improved and she is currently oriented.  PT/OT recommending SNF on discharge.  Family wanted to go home.  Medically stable for discharge.  We recommend follow-up with neurology, outpatient palliative care.  Following problems were addressed during hospitalization:  Status epilepticus: Presented with loss of consciousness after a fall.  EEG with evidence of status epilepticus.  Neurology was following.  Continue Keppra.  Currently remains alert and oriented.  She should follow-up with neurology in 4 weeks Stroke ruled out   Dysphagia: Currently on dysphagia 3 diet   Hypertension: Continue atenolol   Right hand pain: X-ray negative for fracture.   Hypokalemia/hypomagnesemia/hypophosphatemia: Continue supplementation.  Check potassium level, imaging level in a week   Cough: Unclear etiology.  She is saturating fine on room air.  Chest x-ray showed features of COPD, continue albuterol inhaler as needed  physical deconditioning: PT/OT evaluated the patient and recommended SNF on discharge.  Family and patient wants to go home with home health.   Goals of care: Extensive goals of care discussed with the family.    Palliative care was following.  Current plan is to discharge to home with outpatient palliative care follow-up.  Discharge Diagnoses:  Principal Problem:   Stroke [facility]) Active Problems:   HLD (hyperlipidemia)   Essential hypertension   Asthma, chronic   Mitral valve prolapse   Physical deconditioning    Discharge Instructions  Discharge Instructions     Ambulatory referral to Neurology   Complete by: As directed    An appointment is requested in approximately: 4 weeks   Diet general   Complete by: As directed    Dysphagia 3 diet   Discharge instructions   Complete by: As directed    1)Please take prescribed medications as instructed 2)Follow up with your PCP in a week.  BMP and magnesium test to the follow-up 3)Follow up with neurology as an outpatient.  Name and number of the provider group has been attached 4)Follow up with palliative care as an outpatient   Increase activity slowly   Complete by: As directed       Allergies as of 06/19/2022       Reactions   Clindamycin/lincomycin Swelling   Throat swelling   Codeine Other (See Comments)   Unknown reaction   Keflex [cephalexin] Other (See Comments)   Dizziness    Levofloxacin Other (See Comments)   Unknown reaction   Sulfa Antibiotics Other (See Comments)   Unknown reaction   Valtrex [valacyclovir Hcl] Other (See Comments)   Unknown reaction   Vioxx [rofecoxib] Other (See Comments)   Unknown reaction   Adhesive [tape] Rash   Evista [raloxifene] Rash   Macrobid [nitrofurantoin] Nausea Only   Penicillins Swelling   Mouth swelling   Ultram [tramadol] Other (See Comments)   Dizziness  Medication List     TAKE these medications    atenolol 50 MG tablet Commonly known as: TENORMIN Take 1 tablet (50 mg total) by mouth daily. What changed: when to take this   AYR SALINE NASAL NA Place 1 spray into the nose daily as needed (allergies).   estradiol 0.1 MG/GM vaginal cream Commonly known as:  ESTRACE Place 1 Applicatorful vaginally 3 (three) times a week.   fluticasone 50 MCG/ACT nasal spray Commonly known as: FLONASE SPRAY 2 SPRAYS INTO EACH NOSTRIL EVERY DAY What changed: See the new instructions.   levETIRAcetam 500 MG tablet Commonly known as: Keppra Take 1 tablet (500 mg total) by mouth 2 (two) times daily.   magnesium oxide 400 (240 Mg) MG tablet Commonly known as: MAG-OX Take 1 tablet (400 mg total) by mouth daily for 7 days. Start taking on: June 20, 2022   potassium chloride SA 20 MEQ tablet Commonly known as: KLOR-CON M Take 2 tablets (40 mEq total) by mouth daily for 5 days.   simvastatin 20 MG tablet Commonly known as: ZOCOR Take 1 tablet (20 mg total) by mouth daily. What changed: when to take this        Follow-up Information     [facility] Follow up.   Why: The home health agency will contact you for the next home visit Contact information: [facility].   Specialty: PALLIATIVE CARE Why: The palliative care will contact you for the first home visit. Contact information: [place] [id number] [phone]        [facility]. Schedule an appointment as soon as possible for a visit in 4 week(s).   Specialty: Neurology Contact information: [address] [place] [phone]               Allergies  Allergen Reactions   Clindamycin/Lincomycin Swelling    Throat swelling   Codeine Other (See Comments)    Unknown reaction    Keflex [Cephalexin] Other (See Comments)    Dizziness    Levofloxacin Other (See Comments)    Unknown reaction   Sulfa Antibiotics Other (See Comments)    Unknown reaction   Valtrex [Valacyclovir Hcl] Other (See Comments)    Unknown reaction    Vioxx [Rofecoxib] Other (See Comments)    Unknown reaction   Adhesive [Tape] Rash   Evista [Raloxifene]  Rash   Macrobid [Nitrofurantoin] Nausea Only   Penicillins Swelling    Mouth swelling   Ultram [Tramadol] Other (See Comments)    Dizziness     Consultations: Palliative care, neurology   Procedures/Studies: DG CHEST PORT 1 VIEW  Result Date: 06/18/2022 CLINICAL DATA:  Cough beginning today EXAM: PORTABLE CHEST 1 VIEW COMPARISON:  Portable exam 1419 hours compared to 05/01/2015 FINDINGS: Normal heart size, mediastinal contours, and pulmonary vascularity. New line Atherosclerotic calcification aorta. Lungs hyperinflated with central peribronchial thickening and minimal emphysematous changes consistent with COPD. No pulmonary infiltrate, pleural effusion, or pneumothorax. Bones demineralized. IMPRESSION: COPD changes without acute abnormalities. Aortic Atherosclerosis (ICD10-I70.0) and Emphysema (ICD10-J43.9). Electronically Signed   By: Lavonia Dana M.D.   On: 06/18/2022 14:29   DG Hand 2 View Right  Result Date: 06/17/2022 CLINICAL DATA:  Right thumb pain.  Recent fall. EXAM: RIGHT HAND - 2 VIEW COMPARISON:  None Available. FINDINGS: No evidence of acute fracture. No dislocation. 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 Mild-to-moderate osteoarthritic changes of the hand, most pronounced at the thumb. No erosion. Bones are demineralized. There is soft tissue swelling about the thumb. IMPRESSION: Soft tissue swelling about the thumb without acute fracture or dislocation. Electronically Signed   By: Davina Poke D.O.   On: 06/17/2022 14:58   ECHOCARDIOGRAM COMPLETE  Result Date: 06/15/2022    ECHOCARDIOGRAM REPORT   Patient Name:   Paula Jimenez Date of Exam: 06/15/2022 Medical Rec #:  [id number]        Height:       64.0 in Accession #:    [id number]       Weight:       98.3 lb Date of Birth:  [birth year]         BSA:          1.447 m Patient Age:    4 years         BP:           149/100 mmHg Patient Gender: F                HR:           86 bpm. Exam Location:  Inpatient  Procedure: Limited Echo Indications:    Stroke I63.9  History:        Patient has prior history of Echocardiogram examinations, most                 recent 05/07/2015. Stroke; Risk Factors:Hypertension and                 Dyslipidemia.  Sonographer:    Ronny Flurry Referring Phys: [id number] [place]  1. Per sonographer, study canceled by physician. 12 clips obtained, parasternal long axis only.  2. Left ventricular ejection fraction, by estimation, is 65 to 70%. The left ventricle has normal function. The left ventricle has no regional wall motion abnormalities. There is moderate left ventricular hypertrophy. Left ventricular diastolic function  could not be evaluated.  3. Right ventricular systolic function is normal. The right ventricular size is normal.  4. The mitral valve is degenerative. Trivial mitral valve regurgitation. Severe mitral annular calcification.  5. The aortic valve is grossly normal. Aortic valve regurgitation is not visualized. FINDINGS  Left Ventricle: Left ventricular ejection fraction, by estimation, is 65 to 70%. The left ventricle has normal function. The left ventricle has no regional wall motion abnormalities. The left ventricular internal cavity size was normal in size. There is  moderate left ventricular hypertrophy. Left ventricular diastolic function could not be evaluated. Right Ventricle: The right ventricular size is normal. Right vetricular wall thickness was not assessed. Right ventricular systolic function is normal. Left Atrium: Left atrial size was not assessed. Right Atrium: Right atrial size was not assessed. Pericardium: Trivial pericardial effusion is present. Mitral Valve: The mitral valve is degenerative in appearance. There is mild thickening of the mitral valve leaflet(s). There is mild calcification of the mitral valve leaflet(s). Severe mitral annular calcification. Trivial mitral valve regurgitation. Tricuspid Valve: The tricuspid valve is not  assessed. Aortic Valve: The aortic valve is grossly normal. Aortic valve regurgitation is not visualized. Pulmonic Valve: The pulmonic valve was not assessed. Aorta: The aortic root is normal in size and structure. IAS/Shunts: The interatrial septum was not assessed.  LEFT VENTRICLE PLAX 2D LVOT diam:     1.60 cm LVOT Area:     2.01 cm   AORTA Ao Root diam: 2.60 cm  SHUNTS Systemic  Diam: 1.60 cm Cherlynn Kaiser MD Electronically signed by Cherlynn Kaiser MD Signature Date/Time: 06/15/2022/12:27:01 PM    Final    Overnight EEG with video  Result Date: 06/15/2022 Lora Havens, MD     06/16/2022  7:38 AM Patient Name: MYLENA SEDBERRY MRN: [id number] Epilepsy Attending: Lora Havens Referring Physician/Provider: Derek Jack, MD Duration: 06/14/2022 1547 to 06/15/2022 1100 Patient history: 88 y.o. female with a history of altered mental status who is undergoing an EEG to evaluate for seizures. Level of alertness:  lethargic AEDs during EEG study: None Technical aspects: This EEG study was done with scalp electrodes positioned according to the 10-20 International system of electrode placement. Electrical activity was reviewed with band pass filter of 1-'70Hz'$ , sensitivity of 7 uV/mm, display speed of 46m/sec with a '60Hz'$  notched filter applied as appropriate. EEG data were recorded continuously and digitally stored.  Video monitoring was available and reviewed as appropriate. Description: EEG showed continuous generalized 3 to 6 Hz theta-delta slowing. Generalized periodic discharges at 1.5-'3hz'$  were also noted consistent with electrographic status epilepticus.  Hyperventilation and photic stimulation were not performed.   ABNORMALITY - Electrographic status epilepticus, generalized - Continuous slow, generalized IMPRESSION: This study showed evidence of electrographic status epilepticus with generalized onset. Additionally there is moderate to severe diffuse encephalopathy Dr. KLeonel Ramsaywas notified.  PLora Havens  EEG adult  Result Date: 06/14/2022 SDerek Jack MD     06/14/2022  3:53 PM Routine EEG Report FDEZI BRAUNERis a 88y.o. female with a history of altered mental status who is undergoing an EEG to evaluate for seizures. Report: This EEG was acquired with electrodes placed according to the International 10-20 electrode system (including Fp1, Fp2, F3, F4, C3, C4, P3, P4, O1, O2, T3, T4, T5, T6, A1, A2, Fz, Cz, Pz). The following electrodes were missing or displaced: none. The occipital dominant rhythm was 7 Hz with intermittent superimposed further diffuse slowing. This activity is reactive to stimulation. Drowsiness was manifested by background fragmentation; deeper stages of sleep were not identified. There was no focal slowing. There were frequent runs q 3-4 seconds of generalized periodic discharges up to 2-3 Hz lasting 3 seconds or less. There were no electrographic seizures identified. Photic stimulation and hyperventilation were not performed. Impression and clinical correlation: This EEG was obtained while awake and drowsy and is abnormal due to: - Mild-to-moderate diffuse slowing indicative of global cerebral dysfunction - Frequent Brief Potentially Ictal Rhythmic Discharges (BIRDs) lasting 3 seconds or less indicating increased epileptic potential No definitive electrographic seizures were observed. Recommend prolonged EEG. CSu Monks MD [facility] [phone] 7pm- 7am, please page neurology on call [place]   CT CERVICAL SPINE WO CONTRAST  Result Date: 06/14/2022 CLINICAL DATA:  Neck trauma EXAM: CT Cervical Spine with contrast TECHNIQUE: Multiplanar CT images of the cervical spine were reconstructed from contemporary CTA of the Neck. RADIATION DOSE REDUCTION: This exam was performed according to the departmental dose-optimization program which includes automated exposure control, adjustment of the mA and/or kV according to patient size  and/or use of iterative reconstruction technique. COMPARISON:  None Available. FINDINGS: Alignment: Normal. There is no antero or retrolisthesis. There is no jumped or perched facet or other evidence of traumatic malalignment. Skull base and vertebrae: Skull base alignment is maintained. Vertebral body heights are preserved. There is no acute fracture. There is no suspicious osseous lesion. There is fusion of the posterior elements from C3 through C5 on the  left and at C4-C5 on the right. Soft tissues and spinal canal: No prevertebral fluid or swelling. No visible canal hematoma. Disc levels: There is overall mild for age degenerative change in the cervical spine with mild multilevel facet arthropathy, and disc space narrowing degenerative endplate change most advanced at C5-C6 and C6-C7. There is no evidence of high-grade osseous spinal canal or neural foraminal stenosis. Upper chest: There is scarring in the lung apices. Other: None. IMPRESSION: No acute fracture or traumatic malalignment of the cervical spine. Electronically Signed   By: Valetta Mole M.D.   On: 06/14/2022 15:25   CT HEAD CODE STROKE WO CONTRAST  Result Date: 06/14/2022 CLINICAL DATA:  Code stroke.  Neck trauma EXAM: CT ANGIOGRAPHY HEAD AND NECK TECHNIQUE: Multidetector CT imaging of the head and neck was performed using the standard protocol during bolus administration of intravenous contrast. Multiplanar CT image reconstructions and MIPs were obtained to evaluate the vascular anatomy. Carotid stenosis measurements (when applicable) are obtained utilizing NASCET criteria, using the distal internal carotid diameter as the denominator. RADIATION DOSE REDUCTION: This exam was performed according to the departmental dose-optimization program which includes automated exposure control, adjustment of the mA and/or kV according to patient size and/or use of iterative reconstruction technique. CONTRAST:  59m OMNIPAQUE IOHEXOL 350 MG/ML SOLN  COMPARISON:  None Available. FINDINGS: CT HEAD FINDINGS Brain: There is no acute intracranial hemorrhage, extra-axial fluid collection, or acute infarct Parenchymal volume is normal for age. The ventricles are normal in size. Gray-white differentiation is preserved. There is confluent hypodensity throughout the supratentorial white matter likely reflecting advanced background chronic small-vessel ischemic change. There are probable remote infarcts in the bilateral thalami. There is a partially calcified extra-axial lesion overlying the high right frontal lobe at the vertex measuring 1.3 cm x 1.6 cm consistent with a meningioma. There is no significant mass effect on the underlying brain parenchyma or midline shift. Vascular: No hyperdense vessel or unexpected calcification. There is calcification of the bilateral carotid siphons and vertebral arteries. Skull: Normal. Negative for fracture or focal lesion. Sinuses/Orbits: There is layering fluid in the bilateral maxillary sinuses. Bilateral lens implants are in place. The globes and orbits are otherwise unremarkable. Other: None. CTA NECK FINDINGS Aortic arch: There is calcified plaque in the imaged aortic arch. The origins of the major branch vessels are patent. The subclavian arteries are patent to the level imaged. Right carotid system: The right common, internal, and external carotid arteries are patent, with mild plaque of the bifurcation but no hemodynamically significant stenosis or occlusion. There is no dissection or aneurysm. Left carotid system: The left common, internal, and external carotid arteries are patent, with mild plaque at the bifurcation but no hemodynamically significant stenosis or occlusion. There is no dissection or aneurysm. Vertebral arteries: The vertebral arteries are patent, without hemodynamically significant stenosis or occlusion. There is no dissection or aneurysm. Skeleton: There is no acute osseous abnormality or suspicious osseous  lesion. There is no visible canal hematoma. Other neck: There is a 9 mm right thyroid nodule requiring no specific imaging follow-up. Upper chest: There is biapical scarring. Review of the MIP images confirms the above findings CTA HEAD FINDINGS Anterior circulation: There is mild calcified plaque in the carotid siphons but no hemodynamically significant stenosis or occlusion. The bilateral MCAs are patent, without proximal stenosis or occlusion. The bilateral ACAs are patent, without proximal stenosis or occlusion. There is no aneurysm or AVM. Posterior circulation: The bilateral V4 segments are patent. The basilar artery is  patent. The major cerebellar arteries appear patent. The bilateral PCAs are patent, without proximal stenosis or occlusion. A right posterior communicating artery is identified. There is no aneurysm or AVM. Venous sinuses: Not well assessed due to bolus timing. Anatomic variants: None. Review of the MIP images confirms the above findings IMPRESSION: 1. No acute intracranial pathology. 2. Patent vasculature of the head and neck with no hemodynamically significant stenosis or occlusion. 3. Advanced background chronic small-vessel ischemic change. 4. 1.6 cm meningioma overlying the high right frontal lobe. 5. Layering fluid in the maxillary sinuses could reflect acute sinusitis in the correct clinical setting. Findings from the noncontrast head CT were communicated to Dr. Erlinda Hong via amion at 2:38 pm. CTA findings were communicated at 2:52 p.m. Electronically Signed   By: Valetta Mole M.D.   On: 06/14/2022 15:21   CT ANGIO HEAD NECK W WO CM  Result Date: 06/14/2022 CLINICAL DATA:  Code stroke.  Neck trauma EXAM: CT ANGIOGRAPHY HEAD AND NECK TECHNIQUE: Multidetector CT imaging of the head and neck was performed using the standard protocol during bolus administration of intravenous contrast. Multiplanar CT image reconstructions and MIPs were obtained to evaluate the vascular anatomy. Carotid  stenosis measurements (when applicable) are obtained utilizing NASCET criteria, using the distal internal carotid diameter as the denominator. RADIATION DOSE REDUCTION: This exam was performed according to the departmental dose-optimization program which includes automated exposure control, adjustment of the mA and/or kV according to patient size and/or use of iterative reconstruction technique. CONTRAST:  42m OMNIPAQUE IOHEXOL 350 MG/ML SOLN COMPARISON:  None Available. FINDINGS: CT HEAD FINDINGS Brain: There is no acute intracranial hemorrhage, extra-axial fluid collection, or acute infarct Parenchymal volume is normal for age. The ventricles are normal in size. Gray-white differentiation is preserved. There is confluent hypodensity throughout the supratentorial white matter likely reflecting advanced background chronic small-vessel ischemic change. There are probable remote infarcts in the bilateral thalami. There is a partially calcified extra-axial lesion overlying the high right frontal lobe at the vertex measuring 1.3 cm x 1.6 cm consistent with a meningioma. There is no significant mass effect on the underlying brain parenchyma or midline shift. Vascular: No hyperdense vessel or unexpected calcification. There is calcification of the bilateral carotid siphons and vertebral arteries. Skull: Normal. Negative for fracture or focal lesion. Sinuses/Orbits: There is layering fluid in the bilateral maxillary sinuses. Bilateral lens implants are in place. The globes and orbits are otherwise unremarkable. Other: None. CTA NECK FINDINGS Aortic arch: There is calcified plaque in the imaged aortic arch. The origins of the major branch vessels are patent. The subclavian arteries are patent to the level imaged. Right carotid system: The right common, internal, and external carotid arteries are patent, with mild plaque of the bifurcation but no hemodynamically significant stenosis or occlusion. There is no dissection or  aneurysm. Left carotid system: The left common, internal, and external carotid arteries are patent, with mild plaque at the bifurcation but no hemodynamically significant stenosis or occlusion. There is no dissection or aneurysm. Vertebral arteries: The vertebral arteries are patent, without hemodynamically significant stenosis or occlusion. There is no dissection or aneurysm. Skeleton: There is no acute osseous abnormality or suspicious osseous lesion. There is no visible canal hematoma. Other neck: There is a 9 mm right thyroid nodule requiring no specific imaging follow-up. Upper chest: There is biapical scarring. Review of the MIP images confirms the above findings CTA HEAD FINDINGS Anterior circulation: There is mild calcified plaque in the carotid siphons but no hemodynamically significant  stenosis or occlusion. The bilateral MCAs are patent, without proximal stenosis or occlusion. The bilateral ACAs are patent, without proximal stenosis or occlusion. There is no aneurysm or AVM. Posterior circulation: The bilateral V4 segments are patent. The basilar artery is patent. The major cerebellar arteries appear patent. The bilateral PCAs are patent, without proximal stenosis or occlusion. A right posterior communicating artery is identified. There is no aneurysm or AVM. Venous sinuses: Not well assessed due to bolus timing. Anatomic variants: None. Review of the MIP images confirms the above findings IMPRESSION: 1. No acute intracranial pathology. 2. Patent vasculature of the head and neck with no hemodynamically significant stenosis or occlusion. 3. Advanced background chronic small-vessel ischemic change. 4. 1.6 cm meningioma overlying the high right frontal lobe. 5. Layering fluid in the maxillary sinuses could reflect acute sinusitis in the correct clinical setting. Findings from the noncontrast head CT were communicated to Dr. Erlinda Hong via amion at 2:38 pm. CTA findings were communicated at 2:52 p.m. Electronically  Signed   By: Valetta Mole M.D.   On: 06/14/2022 15:21      Subjective: Patient seen and examined at bedside today.  Hemodynamically stable for discharge.  Discharge plan discussed with family at bedside.  Discharge Exam: Vitals:   06/19/22 0333 06/19/22 0827  BP: (!) 142/70 (!) 146/77  Pulse: 80 87  Resp: 16 17  Temp: 98.8 F (37.1 C) 97.9 F (36.6 C)  SpO2: 97% 97%   Vitals:   06/18/22 1947 06/18/22 2334 06/19/22 0333 06/19/22 0827  BP: (!) 164/76 (!) 145/81 (!) 142/70 (!) 146/77  Pulse: 82 84 80 87  Resp: '16 16 16 17  '$ Temp: 99.1 F (37.3 C) (!) 97.5 F (36.4 C) 98.8 F (37.1 C) 97.9 F (36.6 C)  TempSrc: Oral Oral Oral Oral  SpO2: 96% 96% 97% 97%  Weight:      Height:        General: Pt is alert, awake, not in acute distress Cardiovascular: RRR, S1/S2 +, no rubs, no gallops Respiratory: CTA bilaterally, no wheezing, no rhonchi Abdominal: Soft, NT, ND, bowel sounds + Extremities: no edema, no cyanosis    The results of significant diagnostics from this hospitalization (including imaging, microbiology, ancillary and laboratory) are listed below for reference.     Microbiology: Recent Results (from the past 240 hour(s))  Culture, blood (Routine X 2) w Reflex to ID Panel     Status: None   Collection Time: 06/14/22  8:45 PM   Specimen: BLOOD  Result Value Ref Range Status   Specimen Description BLOOD SITE NOT SPECIFIED  Final   Special Requests   Final    BOTTLES DRAWN AEROBIC AND ANAEROBIC Blood Culture results may not be optimal due to an excessive volume of blood received in culture bottles   Culture   Final    NO GROWTH 5 DAYS Performed at [facility], [place] [address] [place], [place] [address]    Report Status 06/19/2022 FINAL  Final  Urine Culture     Status: Abnormal   Collection Time: 06/14/22  8:45 PM   Specimen: Urine, Clean Catch  Result Value Ref Range Status   Specimen Description URINE, CLEAN CATCH  Final   Special Requests   Final     NONE Performed at [facility], [place] [address] [place], [place] [address]    Culture 80,000 COLONIES/mL ESCHERICHIA COLI (A)  Final   Report Status 06/17/2022 FINAL  Final   Organism ID, Bacteria ESCHERICHIA  COLI (A)  Final      Susceptibility   Escherichia coli - MIC*    AMPICILLIN <=2 SENSITIVE Sensitive     CEFAZOLIN <=4 SENSITIVE Sensitive     CEFEPIME <=0.12 SENSITIVE Sensitive     CEFTRIAXONE <=0.25 SENSITIVE Sensitive     CIPROFLOXACIN <=0.25 SENSITIVE Sensitive     GENTAMICIN <=1 SENSITIVE Sensitive     IMIPENEM <=0.25 SENSITIVE Sensitive     NITROFURANTOIN <=16 SENSITIVE Sensitive     TRIMETH/SULFA <=20 SENSITIVE Sensitive     AMPICILLIN/SULBACTAM <=2 SENSITIVE Sensitive     PIP/TAZO <=4 SENSITIVE Sensitive     * 80,000 COLONIES/mL ESCHERICHIA COLI     Labs: BNP (last 3 results) No results for input(s): "BNP" in the last 8760 hours. Basic Metabolic Panel: Recent Labs  Lab 06/14/22 1425 06/14/22 1431 06/14/22 2045 06/16/22 1541 06/17/22 0417 06/18/22 0045 06/18/22 1415 06/19/22 0224  NA 140 135  --  129* 132*  --  134* 134*  K 3.1* 3.0*  --  3.1* 2.9*  --  3.9 3.4*  CL 95* 96*  --  91* 96*  --  101 97*  CO2 22  --   --  23 25  --  26 30  GLUCOSE 133* 130*  --  81 63*  --  153* 111*  BUN 13 13  --  9 12  --  6* <5*  CREATININE 0.64 0.50 0.48 0.55 0.62  --  0.47 0.40*  CALCIUM 7.7*  --   --  8.3* 8.0*  --  7.9* 7.8*  MG  --   --   --   --  1.6* 1.9  --  1.5*  PHOS  --   --   --   --  3.3 1.8*  --  3.0   Liver Function Tests: Recent Labs  Lab 06/14/22 1425 06/16/22 1541 06/17/22 0417  AST '26 20 18  '$ ALT '17 16 14  '$ ALKPHOS 61 61 55  BILITOT 0.2* 1.5* 1.3*  PROT 5.9* 5.7* 5.2*  ALBUMIN 3.1* 2.8* 2.6*   No results for input(s): "LIPASE", "AMYLASE" in the last 168 hours. No results for input(s): "AMMONIA" in the last 168 hours. CBC: Recent Labs  Lab 06/14/22 1425 06/14/22 1431 06/14/22 2045 06/17/22 0417 06/18/22 0045 06/19/22 0224  WBC  8.5  --  8.8 9.0 7.3 4.7  NEUTROABS 4.6  --   --  7.1 5.5 3.1  HGB 13.7 13.9 14.2 12.9 12.7 11.6*  HCT 40.6 41.0 40.7 38.0 35.8* 33.4*  MCV 91.0  --  87.9 88.0 86.7 87.2  PLT 241  --  225 224 208 185   Cardiac Enzymes: No results for input(s): "CKTOTAL", "CKMB", "CKMBINDEX", "TROPONINI" in the last 168 hours. BNP: Invalid input(s): "POCBNP" CBG: Recent Labs  Lab 06/18/22 0334  GLUCAP 110*   D-Dimer No results for input(s): "DDIMER" in the last 72 hours. Hgb A1c No results for input(s): "HGBA1C" in the last 72 hours. Lipid Profile No results for input(s): "CHOL", "HDL", "LDLCALC", "TRIG", "CHOLHDL", "LDLDIRECT" in the last 72 hours. Thyroid function studies No results for input(s): "TSH", "T4TOTAL", "T3FREE", "THYROIDAB" in the last 72 hours.  Invalid input(s): "FREET3" Anemia work up No results for input(s): "VITAMINB12", "FOLATE", "FERRITIN", "TIBC", "IRON", "RETICCTPCT" in the last 72 hours. Urinalysis    Component Value Date/Time   COLORURINE YELLOW (A) 05/14/2015 1009   APPEARANCEUR Clear 11/07/2020 1518   LABSPEC 1.014 05/14/2015 1009   PHURINE 6.0 05/14/2015 1009   GLUCOSEU Negative 11/07/2020  1518   HGBUR 1+ (A) 05/14/2015 1009   HGBUR trace-lysed 09/02/2010 1003   BILIRUBINUR 1 mg/dL 04/07/2022 1214   BILIRUBINUR Negative 11/07/2020 1518   KETONESUR negative 11/26/2021 1023   KETONESUR NEGATIVE 05/14/2015 1009   PROTEINUR Positive (A) 04/07/2022 1214   PROTEINUR Negative 11/07/2020 1518   PROTEINUR NEGATIVE 05/14/2015 1009   UROBILINOGEN 0.2 04/07/2022 1214   UROBILINOGEN 0.2 09/02/2010 1003   NITRITE Negative 04/07/2022 1214   NITRITE Negative 11/07/2020 1518   NITRITE NEGATIVE 05/14/2015 1009   LEUKOCYTESUR Small (1+) (A) 04/07/2022 1214   LEUKOCYTESUR Negative 11/07/2020 1518   Sepsis Labs Recent Labs  Lab 06/14/22 2045 06/17/22 0417 06/18/22 0045 06/19/22 0224  WBC 8.8 9.0 7.3 4.7   Microbiology Recent Results (from the past 240 hour(s))   Culture, blood (Routine X 2) w Reflex to ID Panel     Status: None   Collection Time: 06/14/22  8:45 PM   Specimen: BLOOD  Result Value Ref Range Status   Specimen Description BLOOD SITE NOT SPECIFIED  Final   Special Requests   Final    BOTTLES DRAWN AEROBIC AND ANAEROBIC Blood Culture results may not be optimal due to an excessive volume of blood received in culture bottles   Culture   Final    NO GROWTH 5 DAYS Performed at [facility], [place] [address] [place], [place] [address]    Report Status 06/19/2022 FINAL  Final  Urine Culture     Status: Abnormal   Collection Time: 06/14/22  8:45 PM   Specimen: Urine, Clean Catch  Result Value Ref Range Status   Specimen Description URINE, CLEAN CATCH  Final   Special Requests   Final    NONE Performed at [facility], [place] [address] [place], [place] [address]    Culture 80,000 COLONIES/mL ESCHERICHIA COLI (A)  Final   Report Status 06/17/2022 FINAL  Final   Organism ID, Bacteria ESCHERICHIA COLI (A)  Final      Susceptibility   Escherichia coli - MIC*    AMPICILLIN <=2 SENSITIVE Sensitive     CEFAZOLIN <=4 SENSITIVE Sensitive     CEFEPIME <=0.12 SENSITIVE Sensitive     CEFTRIAXONE <=0.25 SENSITIVE Sensitive     CIPROFLOXACIN <=0.25 SENSITIVE Sensitive     GENTAMICIN <=1 SENSITIVE Sensitive     IMIPENEM <=0.25 SENSITIVE Sensitive     NITROFURANTOIN <=16 SENSITIVE Sensitive     TRIMETH/SULFA <=20 SENSITIVE Sensitive     AMPICILLIN/SULBACTAM <=2 SENSITIVE Sensitive     PIP/TAZO <=4 SENSITIVE Sensitive     * 80,000 COLONIES/mL ESCHERICHIA COLI    Please note: You were cared for by a hospitalist during your hospital stay. Once you are discharged, your primary care physician will handle any further medical issues. Please note that NO REFILLS for any discharge medications will be authorized once you are discharged, as it is imperative that you return to your primary care physician (or establish a relationship with a  primary care physician if you do not have one) for your post hospital discharge needs so that they can reassess your need for medications and monitor your lab values.    Time coordinating discharge: 40 minutes  SIGNED:   Shelly Coss, MD  [facility] 06/19/2022, 11:07 AM Pager [phone]  If 7PM-7AM, please contact night-coverage [email or web address] Password [email or web address]

## 2022-06-19 NOTE — Care Management Important Message (Signed)
Important Message  Patient Details  Name: Paula Jimenez MRN: [id number] Date of Birth: 1927-03-09   Medicare Important Message Given:  Yes     Orbie Pyo 06/19/2022, 2:43 PM

## 2022-06-19 NOTE — Progress Notes (Signed)
Pt to be wheeled off unit by NT after virtual RN discusses DC instructions with pt and her daughter.

## 2022-06-19 NOTE — Progress Notes (Signed)
Mobility Specialist: Progress Note   06/19/22 0947  Mobility  Activity Ambulated with assistance in hallway  Level of Assistance Contact guard assist, steadying assist  Assistive Device Front wheel walker  Distance Ambulated (ft) 500 ft  Activity Response Tolerated well  Mobility Referral Yes  $Mobility charge 1 Mobility   Received pt in bed having no complaints and agreeable to mobility. Pt was asymptomatic throughout ambulation and returned to room w/o fault. Left in bed w/ call bell in reach and all needs met.  [place] Paula Jimenez Mobility Specialist Please contact via SecureChat or Rehab office at [phone]

## 2022-06-20 ENCOUNTER — Telehealth: Payer: Self-pay | Admitting: *Deleted

## 2022-06-20 ENCOUNTER — Telehealth: Payer: Self-pay | Admitting: Family Medicine

## 2022-06-20 NOTE — Telephone Encounter (Signed)
In your inbox.

## 2022-06-20 NOTE — Telephone Encounter (Signed)
VO given to Hospice

## 2022-06-20 NOTE — Telephone Encounter (Signed)
Christain from [facility] dropped off a "Client Coordination Note Report" for pt. Form will be in [facility]'s folder.

## 2022-06-20 NOTE — Telephone Encounter (Signed)
Home Health verbal orders Caller Name: Regina Name: [facility] number: [phone]  Requesting to start palliative program   Please forward to [facility] pool or providers CMA

## 2022-06-20 NOTE — Telephone Encounter (Signed)
Please ok that verbal order Thanks  

## 2022-06-20 NOTE — Patient Outreach (Signed)
  Care Coordination [facility] Note Transition Care Management Follow-up Telephone Call Date of discharge and from where: [facility] [phone] How have you been since you were released from the hospital? She does have some congestion when she first woke up. She has eaten breakfast and dressed herself and went to the bathroom on her own.  Any questions or concerns? No  Items Reviewed: Did the pt receive and understand the discharge instructions provided? Yes  Medications obtained and verified? Yes  Other? Yes patient had not been taking her bp medications patient daughter thinks. Daughter has located the medication and making sure patient gets it. Any new allergies since your discharge? No  Dietary orders reviewed? Yes Dysphagia 3 Do you have support at home? Yes   Home Care and Equipment/Supplies: Were home health services ordered? yes If so, what is the name of the agency? We  Has the agency set up a time to come to the patient's home? no Were any new equipment or medical supplies ordered?  No What is the name of the medical supply agency? N/a Were you able to get the supplies/equipment? no Do you have any questions related to the use of the equipment or supplies? No  Functional Questionnaire: (I = Independent and D = Dependent) ADLs: D  Bathing/Dressing- D  Meal Prep- D  Eating- I  Maintaining continence- I  Transferring/Ambulation- I  Managing Meds- D  Follow up appointments reviewed:  PCP Hospital f/u appt confirmed? No  Daughter will schedule appt. With PCP so she can provide transportation [facility] f/u appt confirmed? No   Are transportation arrangements needed? No  If their condition worsens, is the pt aware to call PCP or go to the Emergency Dept.? Yes Was the patient provided with contact information for the PCP's office or ED? Yes Was to pt encouraged to call back with questions or concerns? Yes  SDOH assessments and interventions completed:   Yes SDOH  Interventions Today    Flowsheet Row Most Recent Value  SDOH Interventions   Food Insecurity Interventions Intervention Not Indicated  Housing Interventions Intervention Not Indicated  Transportation Interventions Intervention Not Indicated       Care Coordination Interventions:  RN discussed coughing and deep breathing    Encounter Outcome:  Pt. Visit Completed    [place] Management [phone]

## 2022-06-24 ENCOUNTER — Telehealth: Payer: Self-pay | Admitting: Family Medicine

## 2022-06-24 DIAGNOSIS — R5381 Other malaise: Secondary | ICD-10-CM

## 2022-06-24 DIAGNOSIS — R531 Weakness: Secondary | ICD-10-CM

## 2022-06-24 DIAGNOSIS — R296 Repeated falls: Secondary | ICD-10-CM

## 2022-06-24 NOTE — Telephone Encounter (Signed)
Received this from home care   Mikle Bosworth, MD Phone Number: [phone]   Hey! My name is Juanda Crumble! I work for [facility]. Patient, Martesha Niedermeier discharged from the hospital last week with no resumption of care orders for home health services. Is there any way you could write an order for Physical Therapy so that way we can start seeing her again? Thank you!   The referral is in  Thanks

## 2022-06-24 NOTE — Telephone Encounter (Signed)
[place] referral in Epic This is under review with [facility]  See referral for updates

## 2022-06-25 ENCOUNTER — Telehealth: Payer: Self-pay | Admitting: Family Medicine

## 2022-06-25 ENCOUNTER — Ambulatory Visit (INDEPENDENT_AMBULATORY_CARE_PROVIDER_SITE_OTHER): Payer: Medicare HMO | Admitting: Family Medicine

## 2022-06-25 ENCOUNTER — Encounter: Payer: Self-pay | Admitting: Family Medicine

## 2022-06-25 VITALS — BP 128/72 | HR 88 | Temp 97.6°F | Ht 63.0 in | Wt 95.0 lb

## 2022-06-25 DIAGNOSIS — D329 Benign neoplasm of meninges, unspecified: Secondary | ICD-10-CM

## 2022-06-25 DIAGNOSIS — R569 Unspecified convulsions: Secondary | ICD-10-CM | POA: Diagnosis not present

## 2022-06-25 DIAGNOSIS — I10 Essential (primary) hypertension: Secondary | ICD-10-CM | POA: Diagnosis not present

## 2022-06-25 DIAGNOSIS — R636 Underweight: Secondary | ICD-10-CM

## 2022-06-25 DIAGNOSIS — N390 Urinary tract infection, site not specified: Secondary | ICD-10-CM | POA: Diagnosis not present

## 2022-06-25 DIAGNOSIS — R531 Weakness: Secondary | ICD-10-CM | POA: Diagnosis not present

## 2022-06-25 DIAGNOSIS — Z87898 Personal history of other specified conditions: Secondary | ICD-10-CM | POA: Insufficient documentation

## 2022-06-25 DIAGNOSIS — R5381 Other malaise: Secondary | ICD-10-CM | POA: Diagnosis not present

## 2022-06-25 DIAGNOSIS — R790 Abnormal level of blood mineral: Secondary | ICD-10-CM

## 2022-06-25 DIAGNOSIS — B370 Candidal stomatitis: Secondary | ICD-10-CM

## 2022-06-25 LAB — CBC WITH DIFFERENTIAL/PLATELET
Basophils Absolute: 0 10*3/uL (ref 0.0–0.1)
Basophils Relative: 0.5 % (ref 0.0–3.0)
Eosinophils Absolute: 0 10*3/uL (ref 0.0–0.7)
Eosinophils Relative: 0.4 % (ref 0.0–5.0)
HCT: 40.1 % (ref 36.0–46.0)
Hemoglobin: 13.6 g/dL (ref 12.0–15.0)
Lymphocytes Relative: 17.1 % (ref 12.0–46.0)
Lymphs Abs: 0.8 10*3/uL (ref 0.7–4.0)
MCHC: 33.8 g/dL (ref 30.0–36.0)
MCV: 89.1 fl (ref 78.0–100.0)
Monocytes Absolute: 0.5 10*3/uL (ref 0.1–1.0)
Monocytes Relative: 11 % (ref 3.0–12.0)
Neutro Abs: 3.2 10*3/uL (ref 1.4–7.7)
Neutrophils Relative %: 71 % (ref 43.0–77.0)
Platelets: 194 10*3/uL (ref 150.0–400.0)
RBC: 4.51 Mil/uL (ref 3.87–5.11)
RDW: 13.5 % (ref 11.5–15.5)
WBC: 4.5 10*3/uL (ref 4.0–10.5)

## 2022-06-25 LAB — COMPREHENSIVE METABOLIC PANEL
ALT: 17 U/L (ref 0–35)
AST: 19 U/L (ref 0–37)
Albumin: 3.5 g/dL (ref 3.5–5.2)
Alkaline Phosphatase: 64 U/L (ref 39–117)
BUN: 19 mg/dL (ref 6–23)
CO2: 35 mEq/L — ABNORMAL HIGH (ref 19–32)
Calcium: 9.1 mg/dL (ref 8.4–10.5)
Chloride: 92 mEq/L — ABNORMAL LOW (ref 96–112)
Creatinine, Ser: 0.51 mg/dL (ref 0.40–1.20)
GFR: 80.33 mL/min (ref >60.00)
Glucose, Bld: 140 mg/dL — ABNORMAL HIGH (ref 70–99)
Potassium: 4.4 mEq/L (ref 3.5–5.1)
Sodium: 134 mEq/L — ABNORMAL LOW (ref 135–145)
Total Bilirubin: 0.4 mg/dL (ref 0.2–1.2)
Total Protein: 6.3 g/dL (ref 6.0–8.3)

## 2022-06-25 LAB — MAGNESIUM: Magnesium: 1.7 mg/dL (ref 1.5–2.5)

## 2022-06-25 MED ORDER — FOSFOMYCIN TROMETHAMINE 3 G PO PACK: 3.0000 g | PACK | Freq: Once | ORAL | 0 refills | Status: AC

## 2022-06-25 MED ORDER — NYSTATIN 100000 UNIT/ML MT SUSP: 5.0000 mL | Freq: Four times a day (QID) | OROMUCOSAL | 0 refills | Status: DC

## 2022-06-25 NOTE — Assessment & Plan Note (Signed)
Replaced in hospital Lab today Was ordered mag ox 400 bid for 7 doses

## 2022-06-25 NOTE — Progress Notes (Signed)
Subjective:    Patient ID: Paula Jimenez, female    DOB: 1926-12-18, 89 y.o.   MRN: [id number]  HPI Pt presents for f/u of hospitalization for seizure    Wt Readings from Last 3 Encounters:  06/25/22 95 lb (43.1 kg)  06/14/22 98 lb 5.2 oz (44.6 kg)  05/26/22 98 lb 6 oz (44.6 kg)   16.83 kg/m Vitals:   06/25/22 1117  BP: 128/72  Pulse: 88  Temp: 97.6 F (36.4 C)  TempSrc: Temporal  SpO2: 98%  Weight: 95 lb (43.1 kg)  Height: '5\' 3"'$  (1.6 m)     Hosp from 12/23 to 12/28 for seizures /status epilepticus  Presented with LOC after a fall   Hosp course   Following problems were addressed during hospitalization:   Status epilepticus: Presented with loss of consciousness after a fall.  EEG with evidence of status epilepticus.  Neurology was following.  Continue Keppra.  Currently remains alert and oriented.  She should follow-up with neurology in 4 weeks Stroke ruled out  Per caregiver - she did not hit her head   Of note- she had seizures when she was young /growing up then they just stopped   Has neuro appt on 2/6    Dysphagia: Currently on dysphagia 3 diet   Hypertension: Continue atenolol   Right hand pain: X-ray negative for fracture.   Hypokalemia/hypomagnesemia/hypophosphatemia: Continue supplementation.  Check potassium level, imaging level in a week   Cough: Unclear etiology.  She is saturating fine on room air.  Chest x-ray showed features of COPD, continue albuterol inhaler as needed   physical deconditioning: PT/OT evaluated the patient and recommended SNF on discharge.  Family and patient wants to go home with home health.   Goals of care: Extensive goals of care discussed with the family.   Palliative care was following.  Current plan is to discharge to home with outpatient palliative care follow-up.   Lab Results  Component Value Date   CREATININE 0.40 (L) 06/19/2022   BUN <5 (L) 06/19/2022   NA 134 (L) 06/19/2022   K 3.4 (L) 06/19/2022   CL  97 (L) 06/19/2022   CO2 30 06/19/2022   Lab Results  Component Value Date   ALT 14 06/17/2022   AST 18 06/17/2022   ALKPHOS 55 06/17/2022   BILITOT 1.3 (H) 06/17/2022   Lab Results  Component Value Date   WBC 4.7 06/19/2022   HGB 11.6 (L) 06/19/2022   HCT 33.4 (L) 06/19/2022   MCV 87.2 06/19/2022   PLT 185 06/19/2022    Urine grew pan sensitive e coli    Cxr report IMPRESSION: COPD changes without acute abnormalities.   Aortic Atherosclerosis (ICD10-I70.0) and Emphysema (ICD10-J43.9).  CT head  IMPRESSION: 1. No acute intracranial pathology. 2. Patent vasculature of the head and neck with no hemodynamically significant stenosis or occlusion. 3. Advanced background chronic small-vessel ischemic change. 4. 1.6 cm meningioma overlying the high right frontal lobe. 5. Layering fluid in the maxillary sinuses could reflect acute sinusitis in the correct clinical setting.   Did well initially when she came home Then something broke out in her mouth (? Maybe an ulcer and /or thrush)  Cannot put her teeth in   Can only eat ensure and pudding and soup and scrambled eggs Now very tired and wants to sleep all the time   Did not drink fluids yesterday  Today may have had a little water this am    Has bedside commode  Gets up and uses it by herself   Patient Active Problem List   Diagnosis Date Noted   Seizure ([place]) 06/25/2022   Low magnesium level 06/25/2022   Thrush 06/25/2022   Physical deconditioning 06/17/2022   Stroke ([place]) 06/14/2022   Mitral valve prolapse 06/14/2022   Transportation insecurity 05/26/2022   Underweight due to inadequate caloric intake 05/26/2022   Meningioma ([place]) 04/21/2022   Generalized weakness 04/21/2022   Falls frequently 04/07/2022   Fall at home 04/07/2022   Varicose vein of leg 10/18/2020   Fatigue 05/30/2019   Onychomycosis of toenail 03/02/2019   Urethral caruncle 12/10/2018   Elevated random blood glucose level 08/25/2018    Medicare annual wellness visit, subsequent 08/25/2018   Loose stools 06/28/2018   Internal hemorrhoid, bleeding 04/20/2018   Corn of toe 04/20/2018   Ingrown nail of great toe of right foot 11/05/2017   Frequent UTI 02/16/2015   Epistaxis, recurrent 02/28/2014   History of shingles 02/20/2014   Dizziness 01/06/2014   Hearing loss 09/23/2007   History of melanoma 02/25/2007   HLD (hyperlipidemia) 02/25/2007   MITRAL VALVE PROLAPSE 02/25/2007   Allergic rhinitis 02/25/2007   Asthma, chronic 02/25/2007   DEGENERATIVE DISC DISEASE, LUMBOSACRAL SPINE 02/25/2007   Age related osteoporosis 02/25/2007   Essential hypertension 10/26/2006   Past Medical History:  Diagnosis Date   Allergy    Asthma    years ago, 2-3 times a year pt experiences a problem with breathing.   Bursitis, hip    Family history of adverse reaction to anesthesia    DAUGHTER had a reaction to ansethesia   Hyperlipidemia    Hypertension    HYPERTENSION, BENIGN 10/26/2006   Qualifier: Diagnosis of  By: Selinda Orion     MITRAL VALVE PROLAPSE 02/25/2007   Qualifier: History of  By: Marcelino Scot CMA, [place]     Osteopenia 08/01   Osteoporosis    Skin cancer 2015   nose   Past Surgical History:  Procedure Laterality Date   ABDOMINAL HYSTERECTOMY  1971   BREAST BIOPSY Right 06/99   fibrocystic   CYSTOSCOPY WITH BIOPSY N/A 05/31/2015   Procedure: CYSTOSCOPY WITH BIOPSY/ fulgeration;  Surgeon: Hollice Espy, MD;  Location: [facility] ORS;  Service: Urology;  Laterality: N/A;   DENTAL SURGERY     EYE SURGERY Bilateral 2011   June and August of 2011   ORIF RADIAL FRACTURE Left 11/99   arm fracture, radial no surgery   TONSILLECTOMY     Social History   Tobacco Use   Smoking status: Never   Smokeless tobacco: Never  Vaping Use   Vaping Use: Never used  Substance Use Topics   Alcohol use: No    Alcohol/week: 0.0 standard drinks of alcohol   Drug use: No   Family History  Problem Relation Age of Onset   Leukemia  Sister    Cancer Sister        leukemia   Heart disease Mother    Diabetes Mother    Heart failure Mother    Stroke Father    Kidney disease Neg Hx    Kidney cancer Neg Hx    Bladder Cancer Neg Hx    Allergies  Allergen Reactions   Clindamycin/Lincomycin Swelling    Throat swelling   Codeine Other (See Comments)    Unknown reaction    Keflex [Cephalexin] Other (See Comments)    Dizziness    Levofloxacin Other (See Comments)    Unknown reaction   Sulfa Antibiotics  Other (See Comments)    Unknown reaction   Valtrex [Valacyclovir Hcl] Other (See Comments)    Unknown reaction    Vioxx [Rofecoxib] Other (See Comments)    Unknown reaction   Adhesive [Tape] Rash   Evista [Raloxifene] Rash   Macrobid [Nitrofurantoin] Nausea Only   Penicillins Swelling    Mouth swelling   Ultram [Tramadol] Other (See Comments)    Dizziness    Current Outpatient Medications on File Prior to Visit  Medication Sig Dispense Refill   albuterol (VENTOLIN HFA) 108 (90 Base) MCG/ACT inhaler Inhale 2 puffs into the lungs every 6 (six) hours as needed for wheezing or shortness of breath. 8 g 1   atenolol (TENORMIN) 50 MG tablet Take 1 tablet (50 mg total) by mouth daily. (Patient taking differently: Take 50 mg by mouth in the morning.) 90 tablet 3   AYR SALINE NASAL NA Place 1 spray into the nose daily as needed (allergies).      estradiol (ESTRACE) 0.1 MG/GM vaginal cream Place 1 Applicatorful vaginally 3 (three) times a week. 42.5 g 1   fluticasone (FLONASE) 50 MCG/ACT nasal spray SPRAY 2 SPRAYS INTO EACH NOSTRIL EVERY DAY (Patient taking differently: Place 1 spray into both nostrils daily as needed for allergies.) 48 mL 0   levETIRAcetam (KEPPRA) 500 MG tablet Take 1 tablet (500 mg total) by mouth 2 (two) times daily. 60 tablet 1   magnesium oxide (MAG-OX) 400 (240 Mg) MG tablet Take 1 tablet (400 mg total) by mouth daily for 7 days. 7 tablet 0   Probiotic Product (ALIGN PO) Take 1 tablet by mouth  daily.     simvastatin (ZOCOR) 20 MG tablet Take 1 tablet (20 mg total) by mouth daily. (Patient taking differently: Take 20 mg by mouth in the morning.) 90 tablet 3   potassium chloride SA (KLOR-CON M) 20 MEQ tablet Take 2 tablets (40 mEq total) by mouth daily for 5 days. 10 tablet 0   No current facility-administered medications on file prior to visit.     Review of Systems  Constitutional:  Positive for fatigue. Negative for activity change, appetite change, fever and unexpected weight change.       More sedated today  HENT:  Positive for mouth sores. Negative for congestion, ear pain, rhinorrhea, sinus pressure and sore throat.   Eyes:  Negative for pain, redness and visual disturbance.  Respiratory:  Negative for cough, shortness of breath and wheezing.   Cardiovascular:  Negative for chest pain and palpitations.  Gastrointestinal:  Negative for abdominal pain, blood in stool, constipation and diarrhea.  Endocrine: Negative for polydipsia and polyuria.  Genitourinary:  Negative for decreased urine volume, dysuria, frequency and urgency.  Musculoskeletal:  Negative for arthralgias, back pain and myalgias.  Skin:  Negative for pallor and rash.  Allergic/Immunologic: Negative for environmental allergies.  Neurological:  Negative for dizziness, syncope and headaches.  Hematological:  Negative for adenopathy. Does not bruise/bleed easily.  Psychiatric/Behavioral:  Negative for decreased concentration and dysphoric mood. The patient is not nervous/anxious.        Objective:   Physical Exam Constitutional:      General: She is not in acute distress.    Appearance: Normal appearance. She is well-developed and normal weight.     Comments: Very tired/sleepy today  Frail appearing   HENT:     Head: Normocephalic and atraumatic.     Right Ear: Tympanic membrane normal.     Left Ear: Tympanic membrane normal.  Nose: Nose normal.     Mouth/Throat:     Mouth: Mucous membranes are  moist.     Comments: White patchy coating on tongue and lower gums consistent with thrush Eyes:     General: No scleral icterus.       Right eye: No discharge.        Left eye: No discharge.     Conjunctiva/sclera: Conjunctivae normal.     Pupils: Pupils are equal, round, and reactive to light.  Neck:     Thyroid: No thyromegaly.     Vascular: No carotid bruit or JVD.  Cardiovascular:     Rate and Rhythm: Normal rate and regular rhythm.     Heart sounds: Normal heart sounds.     No gallop.  Pulmonary:     Effort: Pulmonary effort is normal. No respiratory distress.     Breath sounds: Normal breath sounds. No wheezing or rales.  Abdominal:     General: There is no distension or abdominal bruit.     Palpations: Abdomen is soft.  Musculoskeletal:     Cervical back: Normal range of motion and neck supple.     Right lower leg: No edema.     Left lower leg: No edema.  Lymphadenopathy:     Cervical: No cervical adenopathy.  Skin:    General: Skin is warm and dry.     Coloration: Skin is not jaundiced or pale.     Findings: No bruising or rash.  Neurological:     Mental Status: She is alert.     Cranial Nerves: No cranial nerve deficit.     Coordination: Coordination normal.     Deep Tendon Reflexes: Reflexes are normal and symmetric. Reflexes normal.     Comments: Generalized weakness Can stand with some assist and walker    Psychiatric:        Attention and Perception: Attention normal.        Mood and Affect: Mood normal.        Cognition and Memory: Cognition normal.     Comments: Tired but pleasant            Assessment & Plan:   Problem List Items Addressed This Visit       Cardiovascular and Mediastinum   Essential hypertension   Relevant Orders   Comprehensive metabolic panel (Completed)   CBC with Differential/Platelet (Completed)     Digestive   Thrush    Impacting oral intake after hospitalization  Will tx with nystatin and update         Relevant Medications   fosfomycin (MONUROL) 3 g PACK   nystatin (MYCOSTATIN) 100000 UNIT/ML suspension     Nervous and Auditory   Meningioma ([place])    Unsure if playing a role in recent seizure  No longer has HA On keppra  F/u with neuro in a month         Genitourinary   Frequent UTI    Urine grew pan sens e coli in hospital- and did not treat Many drug intol Sent in fosfomycin 3 g  Hope this will help malaise and ms       Relevant Medications   fosfomycin (MONUROL) 3 g PACK   nystatin (MYCOSTATIN) 100000 UNIT/ML suspension     Other   Generalized weakness    Home health order done  Will need  PT and OT       Relevant Orders   Comprehensive metabolic panel (Completed)   CBC with  Differential/Platelet (Completed)   Low magnesium level    Replaced in hospital Lab today Was ordered mag ox 400 bid for 7 doses      Relevant Orders   Magnesium (Completed)   Physical deconditioning    Home health ref done      Seizure [facility]) - Primary    Hosp for seizure with status epilepticus  Reviewed hospital records, lab results and studies in detail  Now seizure free with keppra but it may be causing sedation  Has neuro f/u in 1 mo   Meningioma -? If playing role Also noted seizures in younger years       Underweight due to inadequate caloric intake    Need to get thrush treated so she can eat again  Pt and caregiver noted need for more protein calories

## 2022-06-25 NOTE — Telephone Encounter (Signed)
Home Health verbal orders Caller Name: denise  Agency Name: [facility]   Callback number: [phone], secured   Requesting OT/PT/Skilled nursing/Social Work/Speech: PT eval  Reason:  Frequency: eval  Please forward to [facility] pool or providers CMA   [place] requested a call back for orders

## 2022-06-25 NOTE — Assessment & Plan Note (Signed)
Need to get thrush treated so she can eat again  Pt and caregiver noted need for more protein calories

## 2022-06-25 NOTE — Telephone Encounter (Signed)
error 

## 2022-06-25 NOTE — Assessment & Plan Note (Signed)
Hosp for seizure with status epilepticus  Reviewed hospital records, lab results and studies in detail  Now seizure free with keppra but it may be causing sedation  Has neuro f/u in 1 mo   Meningioma -? If playing role Also noted seizures in younger years

## 2022-06-25 NOTE — Assessment & Plan Note (Signed)
Unsure if playing a role in recent seizure  No longer has HA On keppra  F/u with neuro in a month

## 2022-06-25 NOTE — Assessment & Plan Note (Signed)
Impacting oral intake after hospitalization  Will tx with nystatin and update

## 2022-06-25 NOTE — Telephone Encounter (Signed)
We did [facility] R[facility] and I can't get anyone to answer, I will try back tomorrow

## 2022-06-25 NOTE — Telephone Encounter (Signed)
I think I put the [facility] order in yesterday  Please ok verbal orders

## 2022-06-25 NOTE — Telephone Encounter (Signed)
Patient daughter Paula Jimenez ([phone]) states that her mom's prescriptions sent today:  nystatin (MYCOSTATIN) 100000 UNIT/ML suspension  fosfomycin (MONUROL) 3 g PACK   [facility]  Advised that on our end I am showing [facility], but will have provider resend.

## 2022-06-25 NOTE — Assessment & Plan Note (Signed)
Urine grew pan sens e coli in hospital- and did not treat Many drug intol Sent in fosfomycin 3 g  Hope this will help malaise and ms

## 2022-06-25 NOTE — Assessment & Plan Note (Signed)
Home health ref done

## 2022-06-25 NOTE — Patient Instructions (Addendum)
Encourage fluids  Let's try to get labs today   Continue current medicines   Take the nystatin solution for thrush as directed  Fosfomycin for uti - mix with fluid and drink   Follow up in 1-2 weeks   I don't see a bed sore but I want to prevent one  Please sit on a cushion (preferably with a hole in the middle to off load pressure) like a donut pillow  Change position frequently   If condition worsens - go to ER / call 911

## 2022-06-25 NOTE — Assessment & Plan Note (Addendum)
Home health order done  Will need  PT and OT

## 2022-06-25 NOTE — Telephone Encounter (Signed)
VO given.

## 2022-06-30 NOTE — Telephone Encounter (Signed)
Pt daughter [name] called in requesting a call back stated she has questions regarding medication [phone]

## 2022-06-30 NOTE — Telephone Encounter (Signed)
Please cut her dose to 1/2 pill twice daily (she is on 500 mg pill)  Check a keppra level please at the end of the week

## 2022-06-30 NOTE — Telephone Encounter (Signed)
Called daughter. States that after starting Paula Jimenez she has been doing nothing but sleep. States that about 30 minutes after taking she not able to stay asleep. She has tried to reach out to neurology but they can't get her in for evaluation until February. Daughter is afraid she is taking to much. She was started on it when she was in hospital.

## 2022-07-01 NOTE — Telephone Encounter (Signed)
[place]

## 2022-07-01 NOTE — Telephone Encounter (Signed)
Daughter notified of Dr. Marliss Coots comments. Pt has f/u on 07/09/22 and asked if we could do labs then due to transportation issues per PCP if that's the only day they can come then we will have to do labs then. We offered her a Education officer, museum eval to see if they can help with transportation and daughter declined she said family/friends are helping pt and she doesn't trust strangers helping pt and she doesn't think pt will want strangers helping her get up and down, daughter will cut med in 1/2 as discussed and will just keep f/u appt with PCP.

## 2022-07-05 ENCOUNTER — Other Ambulatory Visit: Payer: Self-pay | Admitting: Family Medicine

## 2022-07-08 ENCOUNTER — Telehealth: Payer: Self-pay | Admitting: Family Medicine

## 2022-07-08 NOTE — Telephone Encounter (Signed)
Please ok those verbal orders Let me know if new referral is needed

## 2022-07-08 NOTE — Telephone Encounter (Signed)
Home Health verbal orders Caller Name: Tillie Rung  Agency Name: [facility]   Callback number: [phone], secured   Requesting OT/PT/Skilled nursing/Social Work/Speech: speech & language pathology eval  Reason:   Frequency: will call back if needed   Please forward to [facility] pool or providers CMA

## 2022-07-08 NOTE — Telephone Encounter (Signed)
Left VM giving Tillie Rung the VO

## 2022-07-09 ENCOUNTER — Telehealth: Payer: Self-pay | Admitting: Family Medicine

## 2022-07-09 ENCOUNTER — Ambulatory Visit: Payer: Medicare HMO | Admitting: Family Medicine

## 2022-07-09 IMAGING — DX DG ABDOMEN 2V
3 series · 3 of 3 positions shown · non-contrast
Comparison: CT 01/28/2016

CLINICAL DATA: Fall, landed on her abdomen, lower abdominal pain

EXAM:
ABDOMEN - 2 VIEW

[abdomen erect]
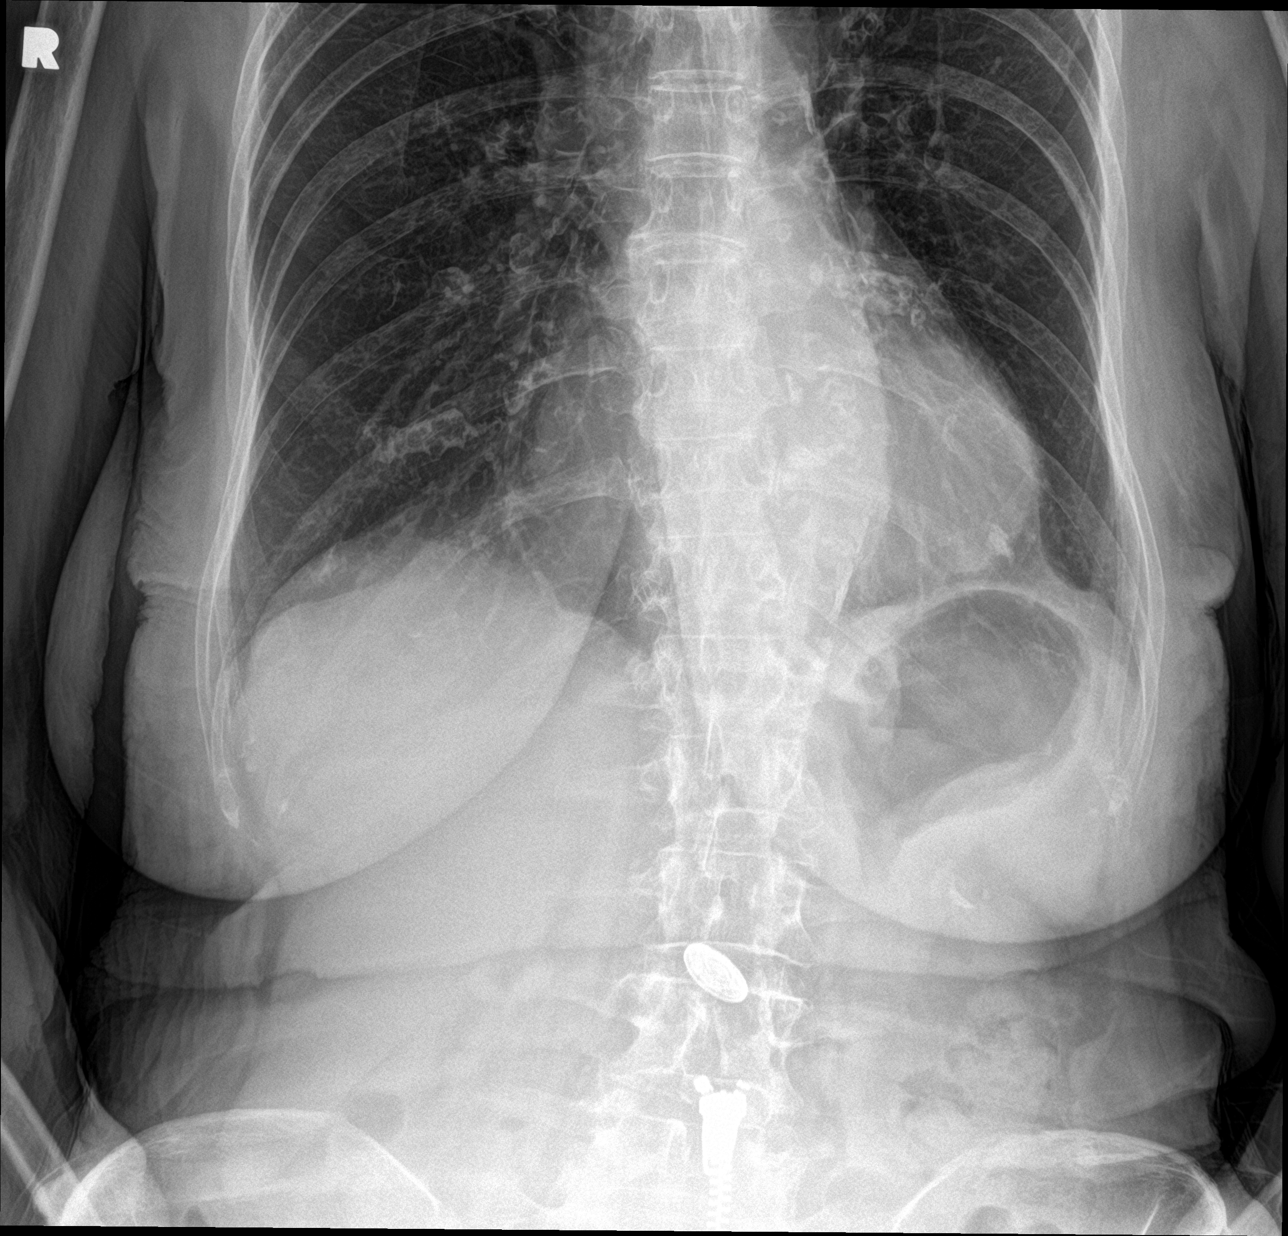

[abdomen supine (1 of 2)]
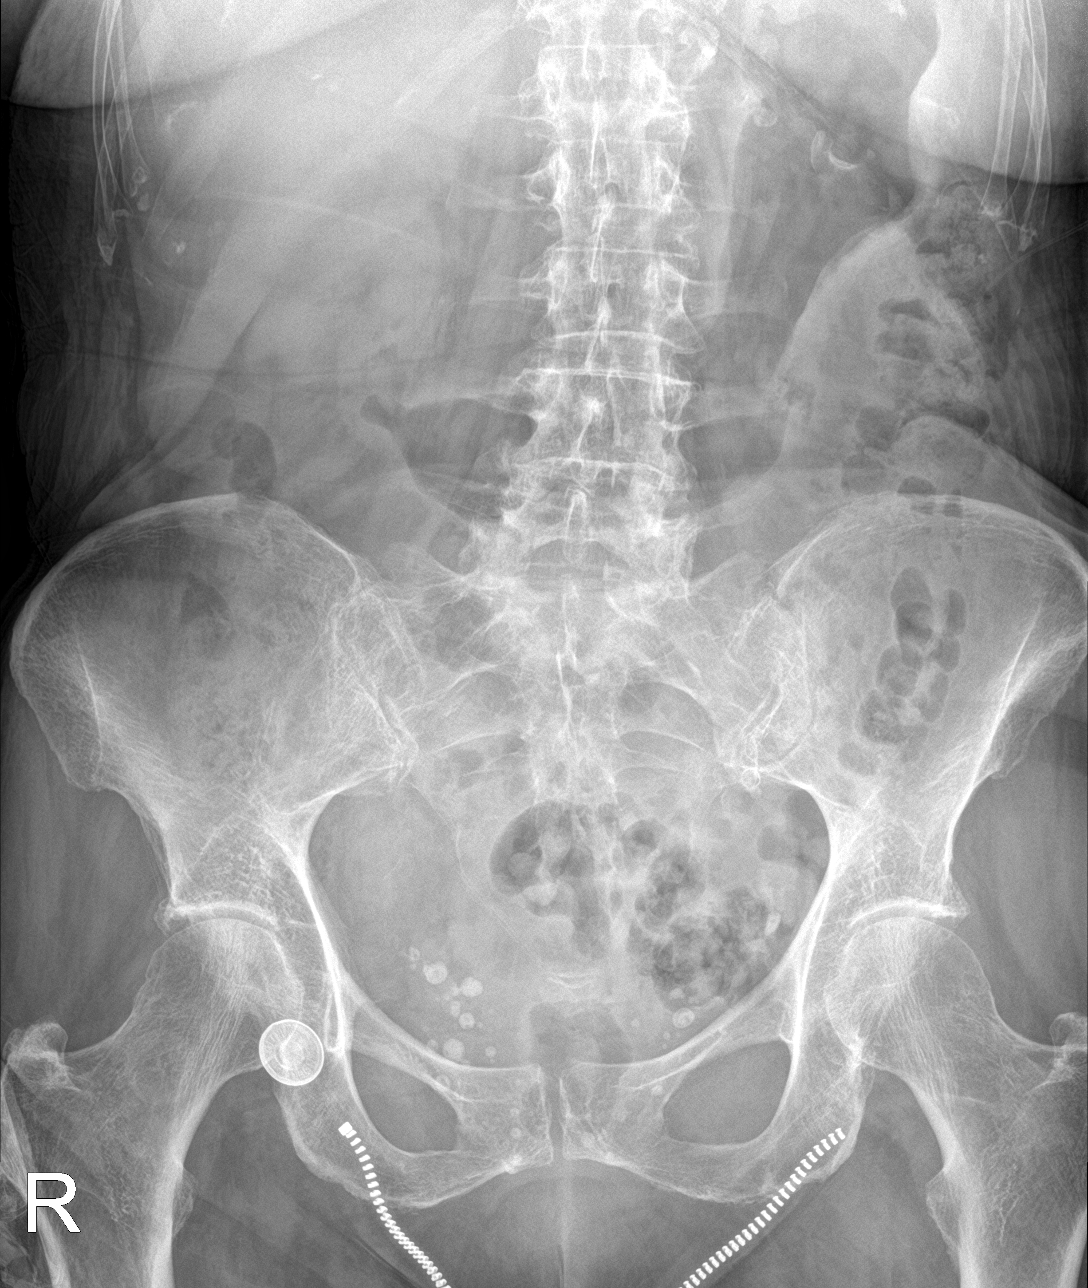

[abdomen supine (2 of 2)]
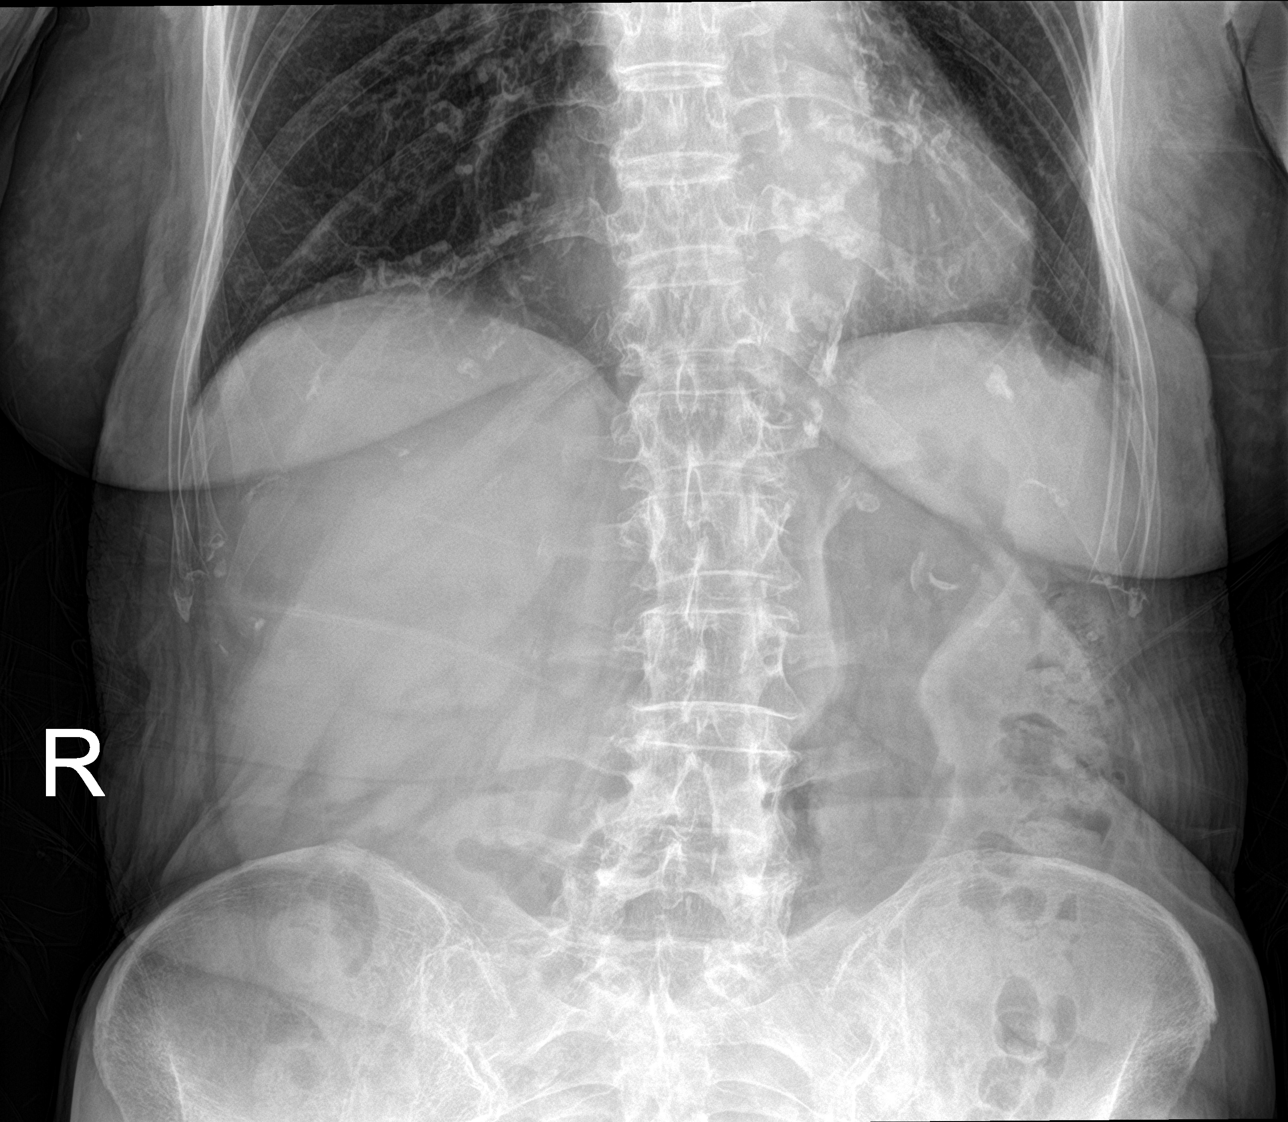

[3 of 3 positions shown; findings below may reference images not displayed]

FINDINGS: Nonobstructive bowel gas pattern. Moderate colonic stool burden.
Pelvic phleboliths. No other abnormal calcifications. Degenerative
changes of the spine. Possible nondisplaced left L3 transverse
process fracture. Mild bilateral hip osteoarthritis.
IMPRESSION: Possible nondisplaced left L3 transverse process fracture.

Nonobstructive bowel gas pattern.  Moderate stool burden.

## 2022-07-09 NOTE — Telephone Encounter (Signed)
What symptoms is she still having ?  We need to get another culture  Please bring in urine sample tomorrow i

## 2022-07-09 NOTE — Telephone Encounter (Signed)
Patient daughter called and stated that if patient can get a refill on medication fosfomycin for the UTI she has having she stated she was told if one round don't work to call back and get another round of the medication because she is still having the issues.

## 2022-07-10 NOTE — Telephone Encounter (Signed)
Pt's daughter said pt had one episode of dysuria yesterday and since we didn't have any appts, she increased pt's water intake and dysuria resolved. Pt has a f/u appt scheduled tomorrow and just asked that we check pt's urine at her appt tomorrow.  Daughter did have a question that can wait to discuss with PCP directly. Pt had some neck pain and asked for tylenol but given she was on new meds she didn't give her any she helped her do some neck/shoulder stretches and it resolved by it's self but daughter wanted to see if PCP thought it was ok for her to take tylenol. Also she wanted to see if pt can resume taking her children's allegra she was on before this issue (daughter stopped that med too). Daughter said right now pt is stable and doesn't want a call back she just wants to discuss this with PCP tomorrow at appt.   FYI to PCP

## 2022-07-10 NOTE — Telephone Encounter (Signed)
Aware, will discuss at visit

## 2022-07-11 ENCOUNTER — Ambulatory Visit (INDEPENDENT_AMBULATORY_CARE_PROVIDER_SITE_OTHER): Payer: Medicare HMO | Admitting: Family Medicine

## 2022-07-11 ENCOUNTER — Encounter: Payer: Self-pay | Admitting: Family Medicine

## 2022-07-11 VITALS — BP 131/70 | HR 81 | Temp 97.8°F | Ht 63.0 in | Wt 95.5 lb

## 2022-07-11 DIAGNOSIS — R636 Underweight: Secondary | ICD-10-CM | POA: Diagnosis not present

## 2022-07-11 DIAGNOSIS — R531 Weakness: Secondary | ICD-10-CM

## 2022-07-11 DIAGNOSIS — R569 Unspecified convulsions: Secondary | ICD-10-CM | POA: Diagnosis not present

## 2022-07-11 DIAGNOSIS — B370 Candidal stomatitis: Secondary | ICD-10-CM

## 2022-07-11 DIAGNOSIS — J301 Allergic rhinitis due to pollen: Secondary | ICD-10-CM

## 2022-07-11 DIAGNOSIS — I10 Essential (primary) hypertension: Secondary | ICD-10-CM | POA: Diagnosis not present

## 2022-07-11 DIAGNOSIS — N390 Urinary tract infection, site not specified: Secondary | ICD-10-CM

## 2022-07-11 DIAGNOSIS — R790 Abnormal level of blood mineral: Secondary | ICD-10-CM | POA: Diagnosis not present

## 2022-07-11 LAB — POC URINALSYSI DIPSTICK (AUTOMATED)
Bilirubin, UA: NEGATIVE
Blood, UA: NEGATIVE
Glucose, UA: NEGATIVE
Ketones, UA: NEGATIVE
Leukocytes, UA: NEGATIVE
Nitrite, UA: NEGATIVE
Protein, UA: NEGATIVE
Spec Grav, UA: 1.01 (ref 1.010–1.025)
Urobilinogen, UA: 0.2 E.U./dL
pH, UA: 7 (ref 5.0–8.0)

## 2022-07-11 LAB — BASIC METABOLIC PANEL
BUN: 13 mg/dL (ref 6–23)
CO2: 35 mEq/L — ABNORMAL HIGH (ref 19–32)
Calcium: 8.7 mg/dL (ref 8.4–10.5)
Chloride: 96 mEq/L (ref 96–112)
Creatinine, Ser: 0.5 mg/dL (ref 0.40–1.20)
GFR: 80.69 mL/min (ref >60.00)
Glucose, Bld: 114 mg/dL — ABNORMAL HIGH (ref 70–99)
Potassium: 4.4 mEq/L (ref 3.5–5.1)
Sodium: 138 mEq/L (ref 135–145)

## 2022-07-11 NOTE — Assessment & Plan Note (Signed)
Pt did not tolerate keppra 500 mg daily  Somnolent and off balance  We cut dose to 250 mg  Tolerating better No seizures For neuro visit next month  Keppra level today

## 2022-07-11 NOTE — Assessment & Plan Note (Signed)
Ua is clear now s/p tx with fosfomycin  Clinically improved, bladder discomfort is better now

## 2022-07-11 NOTE — Assessment & Plan Note (Signed)
Improved last labs 1.7

## 2022-07-11 NOTE — Assessment & Plan Note (Signed)
S/p hosp for seizure Doing better now that she is eating more regularly  Has HH coming out for PT as well  Walking in house with cane or walker

## 2022-07-11 NOTE — Assessment & Plan Note (Signed)
Fair control/ for her age  BP Readings from Last 3 Encounters:  07/11/22 131/70  06/25/22 128/72  06/19/22 137/64   Plan to continue atenolol 50 mg daily

## 2022-07-11 NOTE — Assessment & Plan Note (Signed)
Wt is stable Now with resolution of thrush eating better Enc her to eat regular meals with protein or supplement even if not hungry  She notes this makes her have more energy also

## 2022-07-11 NOTE — Patient Instructions (Addendum)
I will check a keppra level today   Use the estrace vaginal cream  A pea size amount on the vulva and around urethra (a little in the vagina) two to three times weekly - this may help prevent utis   Tylenol is ok for pain as needed  The children's allegra is ok also if needed  Keep doing your therapy  Keep eating regularly even if you are not hungry    Labs today

## 2022-07-11 NOTE — Progress Notes (Signed)
Subjective:    Patient ID: Paula Jimenez, female    DOB: 12-Nov-1926, 89 y.o.   MRN: [id number]  HPI Pt presents for f/u of weakness, thrush , HTN, uti  following hospitalization for seizure   Wt Readings from Last 3 Encounters:  07/11/22 95 lb 8 oz (43.3 kg)  06/25/22 95 lb (43.1 kg)  06/14/22 98 lb 5.2 oz (44.6 kg)   16.92 kg/m  Not eating well some days  The days she eats she does a lot better Has some ensure and carnation inst breakfast   Still tired /sleepy   Last visit tx thrush with nystatin  That is better and now can put her teeth in   HTN  No cp or palpitations or headaches or edema  No side effects to medicines  BP Readings from Last 3 Encounters:  07/11/22 (!) 144/74  06/25/22 128/72  06/19/22 137/64    Pulse Readings from Last 3 Encounters:  07/11/22 81  06/25/22 88  06/19/22 79    Atenolol 50 mg daily   Seizure with meningioma  For neuro f/u  Was on keppra 500 mg daily  It made her too sedated We had then cut dose in 1/2 (she is less fatigued)  Needs labs (has transportation issues and could not come prior) Declines social work consult for transportation needs   Uti  Was tx with fosfomycin for uti she had in hospital Called yesterday and think she needs more medicine   Ua today: Results for orders placed or performed in visit on 07/11/22  POCT Urinalysis Dipstick (Automated)  Result Value Ref Range   Color, UA Yellow    Clarity, UA Clear    Glucose, UA Negative Negative   Bilirubin, UA Negative    Ketones, UA Negative    Spec Grav, UA 1.010 1.010 - 1.025   Blood, UA Negative    pH, UA 7.0 5.0 - 8.0   Protein, UA Negative Negative   Urobilinogen, UA 0.2 0.2 or 1.0 E.U./dL   Nitrite, UA Negative    Leukocytes, UA Negative Negative    Clear now- happy about that  Had a little more discomfort for a while but this is better now  Is pushing water   Has estrace cream on her list  Wants to know if she should use it   Using barrier  cream on buttocks  Area is not bothering her  Got a donut pillow   For gen weakness and deconditioning  home health was ordered That is going well   Had questions about pain med ? If she can take tylenol safely   Also wanted to see if she can resume children's strength allegra    Last labs Lab Results  Component Value Date   CREATININE 0.51 06/25/2022   BUN 19 06/25/2022   NA 134 (L) 06/25/2022   K 4.4 06/25/2022   CL 92 (L) 06/25/2022   CO2 35 (H) 06/25/2022   GFR 80.3  Lab Results  Component Value Date   ALT 17 06/25/2022   AST 19 06/25/2022   ALKPHOS 64 06/25/2022   BILITOT 0.4 06/25/2022    Magnesium 1.7 in nl range   Lab Results  Component Value Date   WBC 4.5 06/25/2022   HGB 13.6 06/25/2022   HCT 40.1 06/25/2022   MCV 89.1 06/25/2022   PLT 194.0 06/25/2022    Patient Active Problem List   Diagnosis Date Noted   Seizure ([place]) 06/25/2022   Thrush 06/25/2022  Physical deconditioning 06/17/2022   History of stroke 06/14/2022   Mitral valve prolapse 06/14/2022   Transportation insecurity 05/26/2022   Underweight due to inadequate caloric intake 05/26/2022   Meningioma ([place]) 04/21/2022   Generalized weakness 04/21/2022   Falls frequently 04/07/2022   Fall at home 04/07/2022   Varicose vein of leg 10/18/2020   Fatigue 05/30/2019   Onychomycosis of toenail 03/02/2019   Urethral caruncle 12/10/2018   Elevated random blood glucose level 08/25/2018   Medicare annual wellness visit, subsequent 08/25/2018   Loose stools 06/28/2018   Internal hemorrhoid, bleeding 04/20/2018   Corn of toe 04/20/2018   Ingrown nail of great toe of right foot 11/05/2017   Frequent UTI 02/16/2015   Epistaxis, recurrent 02/28/2014   History of shingles 02/20/2014   Dizziness 01/06/2014   Hearing loss 09/23/2007   History of melanoma 02/25/2007   HLD (hyperlipidemia) 02/25/2007   MITRAL VALVE PROLAPSE 02/25/2007   Allergic rhinitis 02/25/2007   Asthma, chronic  02/25/2007   DEGENERATIVE DISC DISEASE, LUMBOSACRAL SPINE 02/25/2007   Age related osteoporosis 02/25/2007   Essential hypertension 10/26/2006   Past Medical History:  Diagnosis Date   Allergy    Asthma    years ago, 2-3 times a year pt experiences a problem with breathing.   Bursitis, hip    Family history of adverse reaction to anesthesia    DAUGHTER had a reaction to ansethesia   Hyperlipidemia    Hypertension    HYPERTENSION, BENIGN 10/26/2006   Qualifier: Diagnosis of  By: Selinda Orion     MITRAL VALVE PROLAPSE 02/25/2007   Qualifier: History of  By: Marcelino Scot CMA, [place]     Osteopenia 08/01   Osteoporosis    Skin cancer 2015   nose   Past Surgical History:  Procedure Laterality Date   ABDOMINAL HYSTERECTOMY  1971   BREAST BIOPSY Right 06/99   fibrocystic   CYSTOSCOPY WITH BIOPSY N/A 05/31/2015   Procedure: CYSTOSCOPY WITH BIOPSY/ fulgeration;  Surgeon: Hollice Espy, MD;  Location: [facility];  Service: Urology;  Laterality: N/A;   DENTAL SURGERY     EYE SURGERY Bilateral 2011   June and August of 2011   ORIF RADIAL FRACTURE Left 11/99   arm fracture, radial no surgery   TONSILLECTOMY     Social History   Tobacco Use   Smoking status: Never   Smokeless tobacco: Never  Vaping Use   Vaping Use: Never used  Substance Use Topics   Alcohol use: No    Alcohol/week: 0.0 standard drinks of alcohol   Drug use: No   Family History  Problem Relation Age of Onset   Leukemia Sister    Cancer Sister        leukemia   Heart disease Mother    Diabetes Mother    Heart failure Mother    Stroke Father    Kidney disease Neg Hx    Kidney cancer Neg Hx    Bladder Cancer Neg Hx    Allergies  Allergen Reactions   Clindamycin/Lincomycin Swelling    Throat swelling   Codeine Other (See Comments)    Unknown reaction    Keflex [Cephalexin] Other (See Comments)    Dizziness    Levofloxacin Other (See Comments)    Unknown reaction   Sulfa Antibiotics Other (See  Comments)    Unknown reaction   Valtrex [Valacyclovir Hcl] Other (See Comments)    Unknown reaction    Vioxx [Rofecoxib] Other (See Comments)    Unknown reaction  Adhesive [Tape] Rash   Evista [Raloxifene] Rash   Macrobid [Nitrofurantoin] Nausea Only   Penicillins Swelling    Mouth swelling   Ultram [Tramadol] Other (See Comments)    Dizziness    Current Outpatient Medications on File Prior to Visit  Medication Sig Dispense Refill   albuterol (VENTOLIN HFA) 108 (90 Base) MCG/ACT inhaler Inhale 2 puffs into the lungs every 6 (six) hours as needed for wheezing or shortness of breath. 8 g 1   atenolol (TENORMIN) 50 MG tablet Take 1 tablet (50 mg total) by mouth daily. (Patient taking differently: Take 50 mg by mouth in the morning.) 90 tablet 3   AYR SALINE NASAL NA Place 1 spray into the nose daily as needed (allergies).      fluticasone (FLONASE) 50 MCG/ACT nasal spray Place 2 sprays into both nostrils daily as needed for allergies or rhinitis. 48 mL 0   levETIRAcetam (KEPPRA) 500 MG tablet Take 1 tablet (500 mg total) by mouth 2 (two) times daily. 60 tablet 1   Probiotic Product (ALIGN PO) Take 1 tablet by mouth daily.     simvastatin (ZOCOR) 20 MG tablet Take 1 tablet (20 mg total) by mouth daily. (Patient taking differently: Take 20 mg by mouth in the morning.) 90 tablet 3   estradiol (ESTRACE) 0.1 MG/GM vaginal cream Place 1 Applicatorful vaginally 3 (three) times a week. (Patient not taking: Reported on 07/11/2022) 42.5 g 1   No current facility-administered medications on file prior to visit.    Review of Systems  Constitutional:  Positive for fatigue. Negative for activity change, appetite change, fever and unexpected weight change.  HENT:  Negative for congestion, ear pain, rhinorrhea, sinus pressure and sore throat.   Eyes:  Negative for pain, redness and visual disturbance.  Respiratory:  Negative for cough, shortness of breath and wheezing.   Cardiovascular:  Negative for  chest pain and palpitations.  Gastrointestinal:  Negative for abdominal pain, blood in stool, constipation and diarrhea.  Endocrine: Negative for polydipsia and polyuria.  Genitourinary:  Negative for dysuria, frequency and urgency.  Musculoskeletal:  Positive for arthralgias and back pain. Negative for myalgias.  Skin:  Negative for pallor and rash.  Allergic/Immunologic: Negative for environmental allergies.  Neurological:  Negative for dizziness, syncope and headaches.  Hematological:  Negative for adenopathy. Does not bruise/bleed easily.  Psychiatric/Behavioral:  Negative for decreased concentration and dysphoric mood. The patient is not nervous/anxious.        Objective:   Physical Exam Constitutional:      General: She is not in acute distress.    Appearance: Normal appearance. She is well-developed and normal weight.     Comments: More vibrant appearing today  HENT:     Head: Normocephalic and atraumatic.     Mouth/Throat:     Mouth: Mucous membranes are moist.     Comments: Thrush appears to be resolved   No coating on tongue today Eyes:     General:        Right eye: No discharge.        Left eye: No discharge.     Conjunctiva/sclera: Conjunctivae normal.     Pupils: Pupils are equal, round, and reactive to light.  Neck:     Thyroid: No thyromegaly.     Vascular: No carotid bruit or JVD.  Cardiovascular:     Rate and Rhythm: Normal rate and regular rhythm.     Heart sounds: Normal heart sounds.     No gallop.  Pulmonary:     Effort: Pulmonary effort is normal. No respiratory distress.     Breath sounds: Normal breath sounds. No stridor. No wheezing, rhonchi or rales.     Comments: Occ dry cough when talking   Abdominal:     General: There is no distension or abdominal bruit.     Palpations: Abdomen is soft. There is no mass.     Tenderness: There is no abdominal tenderness.     Comments: No suprapubic tenderness or fullness    Musculoskeletal:     Cervical  back: Normal range of motion and neck supple. No tenderness.     Right lower leg: No edema.     Left lower leg: No edema.  Lymphadenopathy:     Cervical: No cervical adenopathy.  Skin:    General: Skin is warm and dry.     Coloration: Skin is not pale.     Findings: No erythema or rash.     Comments: Skin on sacrum area appears normal without erythema    Neurological:     Mental Status: She is alert.     Cranial Nerves: No cranial nerve deficit.     Coordination: Coordination normal.     Deep Tendon Reflexes: Reflexes are normal and symmetric. Reflexes normal.  Psychiatric:        Attention and Perception: Attention normal.        Mood and Affect: Mood normal. Mood is not anxious or depressed.        Speech: Speech normal.        Behavior: Behavior normal.     Comments: More mentally sharp today  Awake and attentive            Assessment & Plan:   Problem List Items Addressed This Visit       Cardiovascular and Mediastinum   Essential hypertension    Fair control/ for her age  BP Readings from Last 3 Encounters:  07/11/22 131/70  06/25/22 128/72  06/19/22 137/64  Plan to continue atenolol 50 mg daily         Relevant Orders   Basic metabolic panel     Respiratory   Allergic rhinitis     Digestive   Thrush     Genitourinary   Frequent UTI    Ua is clear now s/p tx with fosfomycin  Clinically improved, bladder discomfort is better now       Relevant Orders   POCT Urinalysis Dipstick (Automated) (Completed)     Other   Generalized weakness - Primary    S/p hosp for seizure Doing better now that she is eating more regularly  Has HH coming out for PT as well  Walking in house with cane or walker       Relevant Orders   POCT Urinalysis Dipstick (Automated) (Completed)   RESOLVED: Low magnesium level    Improved last labs 1.7      Seizure ([place])    Pt did not tolerate keppra 500 mg daily  Somnolent and off balance  We cut dose to 250 mg   Tolerating better No seizures For neuro visit next month  Keppra level today      Relevant Orders   Levetiracetam level   Underweight due to inadequate caloric intake    Wt is stable Now with resolution of thrush eating better Enc her to eat regular meals with protein or supplement even if not hungry  She notes this makes her have more energy also

## 2022-07-16 LAB — LEVETIRACETAM LEVEL: Keppra (Levetiracetam): 15.3 ug/mL

## 2022-07-29 ENCOUNTER — Encounter: Payer: Self-pay | Admitting: Neurology

## 2022-07-29 ENCOUNTER — Ambulatory Visit: Payer: Medicare HMO | Admitting: Neurology

## 2022-07-29 VITALS — BP 150/76 | HR 77 | Ht 64.0 in | Wt 98.0 lb

## 2022-07-29 DIAGNOSIS — G40909 Epilepsy, unspecified, not intractable, without status epilepticus: Secondary | ICD-10-CM

## 2022-07-29 DIAGNOSIS — D329 Benign neoplasm of meninges, unspecified: Secondary | ICD-10-CM | POA: Diagnosis not present

## 2022-07-29 NOTE — Progress Notes (Signed)
[facility]  PATIENT: Paula Jimenez DOB: August 12, 1926  REQUESTING CLINICIAN: Shelly Coss, MD HISTORY FROM: Patient and her daughter  REASON FOR VISIT: New onset seizure    HISTORICAL  CHIEF COMPLAINT:  Chief Complaint  Patient presents with   New Patient (Initial Visit)    Rm 15 with daughter here for consult on Seizures; Pt reports     HISTORY OF PRESENT ILLNESS:  This is a 89 year old woman with past medical history hypertension, hyperlipidemia, osteoporosis who is presenting after being admitted to hospital for new onset seizure in December 23. Per daughter patient was in the bathroom when she heard a thud, she found the patient very stiff arms contracted and leg extended, eyes rolled back foaming at the mouth.  There was also blood around her mouth. EMS was called and patient was taken to the hospital.  In the hospital she did have a EEG that showed evidence of nonconvulsive status epilepticus.  She was started on Keppra.  Initially she had side effect of the Keppra including somnolence and fatigue but since Keppra has been decreased to 250 twice daily she has been doing better, still experiences fatigue but less than before.  She has not reported any seizures since discharge from the hospital.  On further review, patient reported while in high school she was having episode of staring spells.  At that time, they did not know what it was and she was not treated.  Overall she is stable.  She lives with her daughter.     Handedness: Right handed   Onset: June 14 2022  Seizure Type: History of staring spell while in high school  Current frequency: Status in December 23  Any injuries from seizures: Tongue biting  Seizure risk factors: Concussion,   Previous ASMs: None   Currenty ASMs: Levetiracetam 250 mg BID   ASMs side effects: Fatigue   Brain Images: no acute finding   Previous EEGs: Status epilepticus    OTHER MEDICAL CONDITIONS:  Hypertension, Hyperlipidemia, Osteoporosis   REVIEW OF SYSTEMS: Full 14 system review of systems performed and negative with exception of: As noted in the HPI   ALLERGIES: Allergies  Allergen Reactions   Clindamycin/Lincomycin Swelling    Throat swelling   Codeine Other (See Comments)    Unknown reaction    Keflex [Cephalexin] Other (See Comments)    Dizziness    Levofloxacin Other (See Comments)    Unknown reaction   Sulfa Antibiotics Other (See Comments)    Unknown reaction   Valtrex [Valacyclovir Hcl] Other (See Comments)    Unknown reaction    Vioxx [Rofecoxib] Other (See Comments)    Unknown reaction   Adhesive [Tape] Rash   Evista [Raloxifene] Rash   Macrobid [Nitrofurantoin] Nausea Only   Penicillins Swelling    Mouth swelling   Ultram [Tramadol] Other (See Comments)    Dizziness     HOME MEDICATIONS: Outpatient Medications Prior to Visit  Medication Sig Dispense Refill   albuterol (VENTOLIN HFA) 108 (90 Base) MCG/ACT inhaler Inhale 2 puffs into the lungs every 6 (six) hours as needed for wheezing or shortness of breath. 8 g 1   atenolol (TENORMIN) 50 MG tablet Take 1 tablet (50 mg total) by mouth daily. (Patient taking differently: Take 50 mg by mouth in the morning.) 90 tablet 3   AYR SALINE NASAL NA Place 1 spray into the nose daily as needed (allergies).      estradiol (ESTRACE) 0.1 MG/GM vaginal cream Place 1 Applicatorful  vaginally 3 (three) times a week. 42.5 g 1   fluticasone (FLONASE) 50 MCG/ACT nasal spray Place 2 sprays into both nostrils daily as needed for allergies or rhinitis. 48 mL 0   levETIRAcetam (KEPPRA) 500 MG tablet Take 1 tablet (500 mg total) by mouth 2 (two) times daily. (Patient taking differently: Take 250 mg by mouth 2 (two) times daily.) 60 tablet 1   Probiotic Product (ALIGN PO) Take 1 tablet by mouth daily.     simvastatin (ZOCOR) 20 MG tablet Take 1 tablet (20 mg total) by mouth daily. (Patient taking differently: Take 20 mg by mouth in  the morning.) 90 tablet 3   No facility-administered medications prior to visit.    PAST MEDICAL HISTORY: Past Medical History:  Diagnosis Date   Allergy    Asthma    years ago, 2-3 times a year pt experiences a problem with breathing.   Bursitis, hip    Family history of adverse reaction to anesthesia    DAUGHTER had a reaction to ansethesia   Hyperlipidemia    Hypertension    HYPERTENSION, BENIGN 10/26/2006   Qualifier: Diagnosis of  By: Selinda Orion     MITRAL VALVE PROLAPSE 02/25/2007   Qualifier: History of  By: Marcelino Scot CMA, [place]     Osteopenia 08/01   Osteoporosis    Skin cancer 2015   nose    PAST SURGICAL HISTORY: Past Surgical History:  Procedure Laterality Date   ABDOMINAL HYSTERECTOMY  1971   BREAST BIOPSY Right 06/99   fibrocystic   CYSTOSCOPY WITH BIOPSY N/A 05/31/2015   Procedure: CYSTOSCOPY WITH BIOPSY/ fulgeration;  Surgeon: Hollice Espy, MD;  Location: [facility] ORS;  Service: Urology;  Laterality: N/A;   DENTAL SURGERY     EYE SURGERY Bilateral 2011   June and August of 2011   ORIF RADIAL FRACTURE Left 11/99   arm fracture, radial no surgery   TONSILLECTOMY      FAMILY HISTORY: Family History  Problem Relation Age of Onset   Leukemia Sister    Cancer Sister        leukemia   Heart disease Mother    Diabetes Mother    Heart failure Mother    Stroke Father    Kidney disease Neg Hx    Kidney cancer Neg Hx    Bladder Cancer Neg Hx     SOCIAL HISTORY: Social History   Socioeconomic History   Marital status: Widowed    Spouse name: Not on file   Number of children: 4   Years of education: Not on file   Highest education level: Not on file  Occupational History   Occupation: retired  Tobacco Use   Smoking status: Never   Smokeless tobacco: Never  Vaping Use   Vaping Use: Never used  Substance and Sexual Activity   Alcohol use: No    Alcohol/week: 0.0 standard drinks of alcohol   Drug use: No   Sexual activity: Not Currently   Other Topics Concern   Not on file  Social History Narrative   caring for daughter with leg injury   caring for sister with leukemia          Social Determinants of Health   Financial Resource Strain: Low Risk  (12/18/2021)   Overall Financial Resource Strain (CARDIA)    Difficulty of Paying Living Expenses: Not hard at all  Food Insecurity: No Food Insecurity (06/20/2022)   Hunger Vital Sign    Worried About Running Out of Food  in the Last Year: Never true    Robinhood in the Last Year: Never true  Transportation Needs: No Transportation Needs (06/20/2022)   PRAPARE - Hydrologist (Medical): No    Lack of Transportation (Non-Medical): No  Physical Activity: Inactive (12/18/2021)   Exercise Vital Sign    Days of Exercise per Week: 0 days    Minutes of Exercise per Session: 0 min  Stress: No Stress Concern Present (12/18/2021)   [place]    Feeling of Stress : Not at all  Social Connections: Not on file  Intimate Partner Violence: Not At Risk (06/15/2022)   Humiliation, Afraid, Rape, and Kick questionnaire    Fear of Current or Ex-Partner: No    Emotionally Abused: No    Physically Abused: No    Sexually Abused: No    PHYSICAL EXAM  GENERAL EXAM/CONSTITUTIONAL: Vitals:  Vitals:   07/29/22 1404  BP: (!) 150/76  Pulse: 77  Weight: 98 lb (44.5 kg)  Height: '5\' 4"'$  (1.626 m)   Body mass index is 16.82 kg/m. Wt Readings from Last 3 Encounters:  07/29/22 98 lb (44.5 kg)  07/11/22 95 lb 8 oz (43.3 kg)  06/25/22 95 lb (43.1 kg)   Patient is in no distress; well developed, nourished and groomed; neck is supple  EYES: Visual fields full to confrontation, Extraocular movements intacts,  No results found.  MUSCULOSKELETAL: Gait, strength, tone, movements noted in Neurologic exam below  NEUROLOGIC: MENTAL STATUS:     11/26/2020    8:20 AM 08/12/2017    4:30 PM  08/08/2016    9:28 AM  MMSE - Mini Mental State Exam  Not completed: Unable to complete    Orientation to time  5 5  Orientation to Place  5 5  Registration  3 3  Attention/ Calculation  0 0  Recall  3 3  Language- name 2 objects  0 0  Language- repeat  1 1  Language- follow 3 step command  3 3  Language- read & follow direction  0 0  Write a sentence  0 0  Copy design  0 0  Total score  20 20   awake, alert, oriented to person, place and time, knows the last 2 [place] president recent and remote memory intact normal attention and concentration, able to count the number of quarters in one and two dollars but unable to count for $1.75 language fluent, comprehension intact, naming intact fund of knowledge appropriate  CRANIAL NERVE:  2nd, 3rd, 4th, 6th - Visual fields full to confrontation, extraocular muscles intact, no nystagmus 5th - facial sensation symmetric 7th - facial strength symmetric 8th - hearing intact 9th - palate elevates symmetrically, uvula midline 11th - shoulder shrug symmetric 12th - tongue protrusion midline  MOTOR:  Frail, thin woman, at least antigravity in the BUE, BLE  SENSORY:  normal and symmetric to light touch  COORDINATION:  finger-nose-finger, fine finger movements normal  REFLEXES:  deep tendon reflexes present and symmetric    DIAGNOSTIC DATA (LABS, IMAGING, TESTING) - I reviewed patient records, labs, notes, testing and imaging myself where available.  Lab Results  Component Value Date   WBC 4.5 06/25/2022   HGB 13.6 06/25/2022   HCT 40.1 06/25/2022   MCV 89.1 06/25/2022   PLT 194.0 06/25/2022      Component Value Date/Time   NA 138 07/11/2022 1109   K 4.4 07/11/2022 1109  CL 96 07/11/2022 1109   CO2 35 (H) 07/11/2022 1109   GLUCOSE 114 (H) 07/11/2022 1109   BUN 13 07/11/2022 1109   CREATININE 0.50 07/11/2022 1109   CALCIUM 8.7 07/11/2022 1109   PROT 6.3 06/25/2022 1200   ALBUMIN 3.5 06/25/2022 1200   AST 19 06/25/2022  1200   ALT 17 06/25/2022 1200   ALKPHOS 64 06/25/2022 1200   BILITOT 0.4 06/25/2022 1200   GFRNONAA >60 06/19/2022 0224   GFRAA 104 10/26/2006 0922   Lab Results  Component Value Date   CHOL 157 06/14/2022   HDL 45 06/14/2022   LDLCALC 97 06/14/2022   LDLDIRECT 159.5 02/19/2009   TRIG 77 06/14/2022   Lab Results  Component Value Date   HGBA1C 6.1 (H) 06/14/2022   No results found for: "VITAMINB12" Lab Results  Component Value Date   TSH 1.23 03/26/2022    CT head 06/14/2022 1. No acute intracranial pathology. 2. Advanced background chronic small-vessel ischemic change. 3. 1.6 cm meningioma overlying the high right frontal lobe. 4. Layering fluid in the maxillary sinuses could reflect acute sinusitis in the correct clinical setting.   LTM EEG 06/15/2022 - Electrographic status epilepticus, generalized - Continuous slow, generalized  ASSESSMENT AND PLAN  89 y.o. year old female  with history of hypertension, hyperlipidemia, osteoporosis who is presenting after being admitted to the hospital for status epilepticus.  Since discharge from the hospital, she has been doing well on Keppra currently 250 mg twice daily.  There is still some side effect of somnolence.  Her latest Keppra level was normal.  At the moment we will continue Keppra 250 twice daily, but advised daughter to contact me if she has any worsening side effects.  At that time we will likely decrease to 120 in the morning and 250 in the evening.  I will see patient in 6 months for follow-up or sooner if worse.  They are both comfortable with plans.   1. Seizure disorder ([place])   2. Meningioma [facility])     Patient Instructions  Continue with Keppra 250 mg twice daily  If still experiences side effect, will further decrease to 125/250.  Continue with physical therapy  Increase PO intake  Follow up in 6 months or sooner if worse    Per [facility] statutes, patients with seizures are not allowed to drive  until they have been seizure-free for six months.  Other recommendations include using caution when using heavy equipment or power tools. Avoid working on ladders or at heights. Take showers instead of baths.  Do not swim alone.  Ensure the water temperature is not too high on the home water heater. Do not go swimming alone. Do not lock yourself in a room alone (i.e. bathroom). When caring for infants or small children, sit down when holding, feeding, or changing them to minimize risk of injury to the child in the event you have a seizure. Maintain good sleep hygiene. Avoid alcohol.  Also recommend adequate sleep, hydration, good diet and minimize stress.   During the Seizure  - First, ensure adequate ventilation and place patients on the floor on their left side  Loosen clothing around the neck and ensure the airway is patent. If the patient is clenching the teeth, do not force the mouth open with any object as this can cause severe damage - Remove all items from the surrounding that can be hazardous. The patient may be oblivious to what's happening and may not even know what  he or she is doing. If the patient is confused and wandering, either gently guide him/her away and block access to outside areas - Reassure the individual and be comforting - Call 911. In most cases, the seizure ends before EMS arrives. However, there are cases when seizures may last over 3 to 5 minutes. Or the individual may have developed breathing difficulties or severe injuries. If a pregnant patient or a person with diabetes develops a seizure, it is prudent to call an ambulance. - Finally, if the patient does not regain full consciousness, then call EMS. Most patients will remain confused for about 45 to 90 minutes after a seizure, so you must use judgment in calling for help. - Avoid restraints but make sure the patient is in a bed with padded side rails - Place the individual in a lateral position with the neck slightly  flexed; this will help the saliva drain from the mouth and prevent the tongue from falling backward - Remove all nearby furniture and other hazards from the area - Provide verbal assurance as the individual is regaining consciousness - Provide the patient with privacy if possible - Call for help and start treatment as ordered by the caregiver   After the Seizure (Postictal Stage)  After a seizure, most patients experience confusion, fatigue, muscle pain and/or a headache. Thus, one should permit the individual to sleep. For the next few days, reassurance is essential. Being calm and helping reorient the person is also of importance.  Most seizures are painless and end spontaneously. Seizures are not harmful to others but can lead to complications such as stress on the lungs, brain and the heart. Individuals with prior lung problems may develop labored breathing and respiratory distress.     No orders of the defined types were placed in this encounter.   No orders of the defined types were placed in this encounter.   Return in about 6 months (around 01/27/2023).    Alric Ran, MD 07/29/2022, 2:51 PM  [facility] [address], [place], [place] [address] [phone]

## 2022-07-29 NOTE — Patient Instructions (Signed)
Continue with Keppra 250 mg twice daily  If still experiences side effect, will further decrease to 125/250.  Continue with physical therapy  Increase PO intake  Follow up in 6 months or sooner if worse

## 2022-08-07 ENCOUNTER — Telehealth: Payer: Self-pay | Admitting: Neurology

## 2022-08-07 DIAGNOSIS — H52223 Regular astigmatism, bilateral: Secondary | ICD-10-CM | POA: Diagnosis not present

## 2022-08-07 DIAGNOSIS — Z0100 Encounter for examination of eyes and vision without abnormal findings: Secondary | ICD-10-CM | POA: Diagnosis not present

## 2022-08-07 DIAGNOSIS — H35341 Macular cyst, hole, or pseudohole, right eye: Secondary | ICD-10-CM | POA: Diagnosis not present

## 2022-08-07 DIAGNOSIS — Z9841 Cataract extraction status, right eye: Secondary | ICD-10-CM | POA: Diagnosis not present

## 2022-08-07 DIAGNOSIS — Z9842 Cataract extraction status, left eye: Secondary | ICD-10-CM | POA: Diagnosis not present

## 2022-08-07 MED ORDER — LEVETIRACETAM 250 MG PO TABS: ORAL_TABLET | ORAL | 3 refills | Status: DC

## 2022-08-07 NOTE — Telephone Encounter (Signed)
I returned pt's daughter call. She reports the pt continues to have day time sleepiness/grogginess.   The pt is currently taking Keppra 500 mg: 1/2 tablet in the morning(250 mg) and 1/2 tablet in the evening( 250 mg).    Per Dr. Jabier Mutton last note from 07/29/2022 further decrease can be made to equal 125 mg in the morning and 250 mg in the evening if pt still experiences side effects.   Pt is due for updated refill I will send Keppra 250 mg tablets with instructions to take 125 mg ( 1/2 tablet) in the morning and 250 mg (1 tablet) in the evening. Daughter will keep [place] up to date on how she is doing. Ref[facility] to [facility].

## 2022-08-07 NOTE — Telephone Encounter (Signed)
Daughter states pt is down to 2 of the levETIRAcetam (KEPPRA) 500 MG tablet, still using [facility] #[id number]  Daughter would like a call to discuss lowering the dose as well.

## 2022-09-18 ENCOUNTER — Ambulatory Visit: Payer: Medicare HMO | Admitting: Podiatry

## 2022-10-01 ENCOUNTER — Ambulatory Visit: Payer: Medicare HMO | Admitting: Podiatry

## 2022-10-13 ENCOUNTER — Ambulatory Visit (INDEPENDENT_AMBULATORY_CARE_PROVIDER_SITE_OTHER): Payer: Medicare HMO | Admitting: Family Medicine

## 2022-10-13 ENCOUNTER — Encounter: Payer: Self-pay | Admitting: Family Medicine

## 2022-10-13 VITALS — Temp 97.4°F | Ht 64.0 in | Wt 99.1 lb

## 2022-10-13 DIAGNOSIS — R42 Dizziness and giddiness: Secondary | ICD-10-CM | POA: Diagnosis not present

## 2022-10-13 DIAGNOSIS — I10 Essential (primary) hypertension: Secondary | ICD-10-CM

## 2022-10-13 DIAGNOSIS — R350 Frequency of micturition: Secondary | ICD-10-CM | POA: Diagnosis not present

## 2022-10-13 DIAGNOSIS — R410 Disorientation, unspecified: Secondary | ICD-10-CM | POA: Diagnosis not present

## 2022-10-13 LAB — POC URINALSYSI DIPSTICK (AUTOMATED)
Bilirubin, UA: NEGATIVE
Blood, UA: 10 — AB
Glucose, UA: NEGATIVE
Ketones, UA: NEGATIVE
Nitrite, UA: NEGATIVE
Protein, UA: NEGATIVE
Spec Grav, UA: 1.015 (ref 1.010–1.025)
Urobilinogen, UA: 0.2 E.U./dL
pH, UA: 6 (ref 5.0–8.0)

## 2022-10-13 LAB — POCT UA - MICROSCOPIC ONLY

## 2022-10-13 NOTE — Assessment & Plan Note (Signed)
BP: (!) 142/66   Taking atenolol 50 mg daily  Pulse is normal  Pt has some dizziness at home but no significant orthostatic changes today   May feel better taking med at bedtime- inst that she should continue this and monitor

## 2022-10-13 NOTE — Assessment & Plan Note (Signed)
Family thinks she is more easily confused lately - wondered if poss uti  Ua is borderline today-pend cx (will tx if pos) Enc regular meals and fluids-is doing better No changes on exam today

## 2022-10-13 NOTE — Patient Instructions (Addendum)
We will call you with a urine culture result   Keep drinking fluids  Keep eating regularly - with protein   When you change position-pause and adjust before you start moving   Continue to take the atenolol at bedtime instead of the morning   Monitor your symptoms and let me know if things change   Take care of yourself

## 2022-10-13 NOTE — Assessment & Plan Note (Signed)
Ua dip and micor are borderline some bacteria/ wbc) Cx pending Watching hydratoin and mental status closely  Update if not starting to improve in a week or if worsening

## 2022-10-13 NOTE — Progress Notes (Signed)
Subjective:    Patient ID: Paula Jimenez, female    DOB: 09-18-1926, 89 y.o.   MRN: [id number]  HPI Pt presents with c/o light headedness Confusion  Also poss uti   Wt Readings from Last 3 Encounters:  10/13/22 99 lb 2 oz (45 kg)  07/29/22 98 lb (44.5 kg)  07/11/22 95 lb 8 oz (43.3 kg)   17.01 kg/m   Nutrition : - makes herself eat now  Eating something every 2 hours  Wt is going up  Also drinking more water Fluids -doing better   Fatigue   HTN bp is stable today  No cp or palpitations or headaches or edema  No side effects to medicines  BP Readings from Last 3 Encounters:  10/13/22 (!) 142/66  07/29/22 (!) 150/76  07/11/22 131/70    Pulse Readings from Last 3 Encounters:  10/13/22 79  07/29/22 77  07/11/22 81    Atenolol 50 mg daily - was taking in am for a while Now moved to evening to see if it helped   Lying 140/70 Siting 140/70 Standing 135/72     Seizure disorder  Keppra - taking 250 mg tab   1/2 in am and 1 in pm  Has a meningioma as well and sees neurology  Last uti was tx with fosfomycin  Has multiple drug intolerances  Urination not changed- no burning  Drinking more water- so up a bit more at night    Last urine culture 06/14/22 E coli pan sensitive   Today small leuk and rbc on dip   Results for orders placed or performed in visit on 10/13/22  POCT Urinalysis Dipstick (Automated)  Result Value Ref Range   Color, UA Yellow    Clarity, UA Clear    Glucose, UA Negative Negative   Bilirubin, UA Negative    Ketones, UA Negative    Spec Grav, UA 1.015 1.010 - 1.025   Blood, UA 10 Ery/uL (A)    pH, UA 6.0 5.0 - 8.0   Protein, UA Negative Negative   Urobilinogen, UA 0.2 0.2 or 1.0 E.U./dL   Nitrite, UA Negative    Leukocytes, UA Small (1+) (A) Negative  POCT UA - Microscopic Only  Result Value Ref Range   WBC, Ur, HPF, POC 1-6 0 - 5   RBC, Urine, Miroscopic 0-1 0 - 2   Bacteria, U Microscopic few None - Trace   Mucus, UA  few    Epithelial cells, urine per micros mod    Crystals, Ur, HPF, POC none    Casts, Ur, LPF, POC none    Yeast, UA none      Lab Results  Component Value Date   CREATININE 0.50 07/11/2022   BUN 13 07/11/2022   NA 138 07/11/2022   K 4.4 07/11/2022   CL 96 07/11/2022   CO2 35 (H) 07/11/2022   GFR last was 80.6  Patient Active Problem List   Diagnosis Date Noted   Confusion 10/13/2022   Urinary frequency 10/13/2022   Seizure 06/25/2022   Thrush 06/25/2022   Physical deconditioning 06/17/2022   History of stroke 06/14/2022   Mitral valve prolapse 06/14/2022   Transportation insecurity 05/26/2022   Underweight due to inadequate caloric intake 05/26/2022   Meningioma 04/21/2022   Generalized weakness 04/21/2022   Falls frequently 04/07/2022   Fall at home 04/07/2022   Varicose vein of leg 10/18/2020   Fatigue 05/30/2019   Onychomycosis of toenail 03/02/2019   Urethral caruncle 12/10/2018  Elevated random blood glucose level 08/25/2018   Medicare annual wellness visit, subsequent 08/25/2018   Loose stools 06/28/2018   Internal hemorrhoid, bleeding 04/20/2018   Corn of toe 04/20/2018   Ingrown nail of great toe of right foot 11/05/2017   Frequent UTI 02/16/2015   Epistaxis, recurrent 02/28/2014   History of shingles 02/20/2014   Dizziness 01/06/2014   Hearing loss 09/23/2007   History of melanoma 02/25/2007   HLD (hyperlipidemia) 02/25/2007   MITRAL VALVE PROLAPSE 02/25/2007   Allergic rhinitis 02/25/2007   Asthma, chronic 02/25/2007   DEGENERATIVE DISC DISEASE, LUMBOSACRAL SPINE 02/25/2007   Age related osteoporosis 02/25/2007   Essential hypertension 10/26/2006   Past Medical History:  Diagnosis Date   Allergy    Asthma    years ago, 2-3 times a year pt experiences a problem with breathing.   Bursitis, hip    Family history of adverse reaction to anesthesia    DAUGHTER had a reaction to ansethesia   Hyperlipidemia    Hypertension    HYPERTENSION,  BENIGN 10/26/2006   Qualifier: Diagnosis of  By: Mills Koller     MITRAL VALVE PROLAPSE 02/25/2007   Qualifier: History of  By: Lindwood Qua CMA, Jerl Santos     Osteopenia 08/01   Osteoporosis    Skin cancer 2015   nose   Past Surgical History:  Procedure Laterality Date   ABDOMINAL HYSTERECTOMY  1971   BREAST BIOPSY Right 06/99   fibrocystic   CYSTOSCOPY WITH BIOPSY N/A 05/31/2015   Procedure: CYSTOSCOPY WITH BIOPSY/ fulgeration;  Surgeon: Vanna Scotland, MD;  Location: [facility];  Service: Urology;  Laterality: N/A;   DENTAL SURGERY     EYE SURGERY Bilateral 2011   June and August of 2011   ORIF RADIAL FRACTURE Left 11/99   arm fracture, radial no surgery   TONSILLECTOMY     Social History   Tobacco Use   Smoking status: Never   Smokeless tobacco: Never  Vaping Use   Vaping Use: Never used  Substance Use Topics   Alcohol use: No    Alcohol/week: 0.0 standard drinks of alcohol   Drug use: No   Family History  Problem Relation Age of Onset   Leukemia Sister    Cancer Sister        leukemia   Heart disease Mother    Diabetes Mother    Heart failure Mother    Stroke Father    Kidney disease Neg Hx    Kidney cancer Neg Hx    Bladder Cancer Neg Hx    Allergies  Allergen Reactions   Clindamycin/Lincomycin Swelling    Throat swelling   Codeine Other (See Comments)    Unknown reaction    Keflex [Cephalexin] Other (See Comments)    Dizziness    Levofloxacin Other (See Comments)    Unknown reaction   Sulfa Antibiotics Other (See Comments)    Unknown reaction   Valtrex [Valacyclovir Hcl] Other (See Comments)    Unknown reaction    Vioxx [Rofecoxib] Other (See Comments)    Unknown reaction   Adhesive [Tape] Rash   Evista [Raloxifene] Rash   Macrobid [Nitrofurantoin] Nausea Only   Penicillins Swelling    Mouth swelling   Ultram [Tramadol] Other (See Comments)    Dizziness    Current Outpatient Medications on File Prior to Visit  Medication Sig Dispense Refill    albuterol (VENTOLIN HFA) 108 (90 Base) MCG/ACT inhaler Inhale 2 puffs into the lungs every 6 (six) hours as needed  for wheezing or shortness of breath. 8 g 1   atenolol (TENORMIN) 50 MG tablet Take 1 tablet (50 mg total) by mouth daily. (Patient taking differently: Take 50 mg by mouth in the morning.) 90 tablet 3   AYR SALINE NASAL NA Place 1 spray into the nose daily as needed (allergies).      estradiol (ESTRACE) 0.1 MG/GM vaginal cream Place 1 Applicatorful vaginally 3 (three) times a week. 42.5 g 1   fluticasone (FLONASE) 50 MCG/ACT nasal spray Place 2 sprays into both nostrils daily as needed for allergies or rhinitis. 48 mL 0   levETIRAcetam (KEPPRA) 250 MG tablet Take 1/2 tablet in the morning and 1 tablet in the evening 45 tablet 3   Probiotic Product (ALIGN PO) Take 1 tablet by mouth daily.     simvastatin (ZOCOR) 20 MG tablet Take 1 tablet (20 mg total) by mouth daily. (Patient taking differently: Take 20 mg by mouth in the morning.) 90 tablet 3   No current facility-administered medications on file prior to visit.      Review of Systems  Constitutional:  Positive for fatigue. Negative for activity change, appetite change, fever and unexpected weight change.  HENT:  Negative for congestion, ear pain, rhinorrhea, sinus pressure and sore throat.   Eyes:  Negative for pain, redness and visual disturbance.  Respiratory:  Negative for cough, shortness of breath and wheezing.   Cardiovascular:  Negative for chest pain and palpitations.  Gastrointestinal:  Negative for abdominal pain, blood in stool, constipation and diarrhea.  Endocrine: Negative for polydipsia and polyuria.  Genitourinary:  Positive for frequency. Negative for dysuria and urgency.  Musculoskeletal:  Negative for arthralgias, back pain and myalgias.  Skin:  Negative for pallor and rash.  Allergic/Immunologic: Negative for environmental allergies.  Neurological:  Positive for dizziness and light-headedness. Negative for  tremors, seizures, syncope, facial asymmetry, speech difficulty, numbness and headaches.  Hematological:  Negative for adenopathy. Does not bruise/bleed easily.  Psychiatric/Behavioral:  Positive for confusion. Negative for decreased concentration and dysphoric mood. The patient is not nervous/anxious.        More confused moments lately         Objective:   Physical Exam Constitutional:      General: She is not in acute distress.    Appearance: Normal appearance. She is well-developed and normal weight. She is not ill-appearing.     Comments: Underweight  Frail appearing   HENT:     Head: Normocephalic and atraumatic.  Eyes:     Conjunctiva/sclera: Conjunctivae normal.     Pupils: Pupils are equal, round, and reactive to light.  Neck:     Thyroid: No thyromegaly.     Vascular: No carotid bruit or JVD.  Cardiovascular:     Rate and Rhythm: Normal rate and regular rhythm.     Heart sounds: Normal heart sounds.     No gallop.  Pulmonary:     Effort: Pulmonary effort is normal. No respiratory distress.     Breath sounds: Normal breath sounds. No wheezing or rales.  Abdominal:     General: There is no distension or abdominal bruit.     Palpations: Abdomen is soft. There is no mass.     Tenderness: There is no abdominal tenderness. There is no right CVA tenderness, left CVA tenderness, guarding or rebound.     Hernia: No hernia is present.     Comments: Bladder does not feel distended   Musculoskeletal:     Cervical back:  Normal range of motion and neck supple.     Right lower leg: No edema.     Left lower leg: No edema.  Lymphadenopathy:     Cervical: No cervical adenopathy.  Skin:    General: Skin is warm and dry.     Coloration: Skin is not pale.     Findings: No rash.  Neurological:     Mental Status: She is alert.     Cranial Nerves: No cranial nerve deficit.     Sensory: No sensory deficit.     Motor: No weakness.     Coordination: Coordination normal.     Gait:  Gait normal.     Deep Tendon Reflexes: Reflexes are normal and symmetric. Reflexes normal.  Psychiatric:        Mood and Affect: Mood normal.           Assessment & Plan:   Problem List Items Addressed This Visit       Cardiovascular and Mediastinum   Essential hypertension    BP: (!) 142/66   Taking atenolol 50 mg daily  Pulse is normal  Pt has some dizziness at home but no significant orthostatic changes today   May feel better taking med at bedtime- inst that she should continue this and monitor          Nervous and Auditory   Confusion    Family thinks she is more easily confused lately - wondered if poss uti  Ua is borderline today-pend cx (will tx if pos) Enc regular meals and fluids-is doing better No changes on exam today        Relevant Orders   POCT Urinalysis Dipstick (Automated) (Completed)   POCT UA - Microscopic Only (Completed)   Urine Culture     Other   Dizziness - Primary    ? If intermittent/ acute on chronic  No significant orthostatic bp or pulse changes today lying, sitting, standing  Was not actively dizzy Still some fatigue from keppra (no seizures) Reassuring exam/no neuro changes today Borderline ua Pend cx  Enc regular meals and to continue inc fluids (is doing well with this) Will reach out with cx result and tx of pos Inst family to monitor closely in meantime       Urinary frequency    Ua dip and micor are borderline some bacteria/ wbc) Cx pending Watching hydratoin and mental status closely  Update if not starting to improve in a week or if worsening        Relevant Orders   POCT UA - Microscopic Only (Completed)   Urine Culture

## 2022-10-13 NOTE — Assessment & Plan Note (Signed)
?   If intermittent/ acute on chronic  No significant orthostatic bp or pulse changes today lying, sitting, standing  Was not actively dizzy Still some fatigue from keppra (no seizures) Reassuring exam/no neuro changes today Borderline ua Pend cx  Enc regular meals and to continue inc fluids (is doing well with this) Will reach out with cx result and tx of pos Inst family to monitor closely in meantime

## 2022-10-14 LAB — URINE CULTURE
MICRO NUMBER:: 14856079
SPECIMEN QUALITY:: ADEQUATE

## 2022-10-23 ENCOUNTER — Other Ambulatory Visit: Payer: Self-pay | Admitting: Neurology

## 2022-11-04 ENCOUNTER — Ambulatory Visit (INDEPENDENT_AMBULATORY_CARE_PROVIDER_SITE_OTHER): Payer: Medicare HMO | Admitting: Family Medicine

## 2022-11-04 ENCOUNTER — Encounter: Payer: Self-pay | Admitting: Family Medicine

## 2022-11-04 VITALS — BP 130/78 | HR 75 | Temp 97.2°F | Ht 63.0 in | Wt 101.1 lb

## 2022-11-04 DIAGNOSIS — J301 Allergic rhinitis due to pollen: Secondary | ICD-10-CM

## 2022-11-04 DIAGNOSIS — Z87440 Personal history of urinary (tract) infections: Secondary | ICD-10-CM

## 2022-11-04 DIAGNOSIS — H9193 Unspecified hearing loss, bilateral: Secondary | ICD-10-CM | POA: Diagnosis not present

## 2022-11-04 DIAGNOSIS — N390 Urinary tract infection, site not specified: Secondary | ICD-10-CM

## 2022-11-04 DIAGNOSIS — R636 Underweight: Secondary | ICD-10-CM

## 2022-11-04 LAB — POC URINALSYSI DIPSTICK (AUTOMATED)
Bilirubin, UA: NEGATIVE
Blood, UA: NEGATIVE
Glucose, UA: NEGATIVE
Ketones, UA: NEGATIVE
Leukocytes, UA: NEGATIVE
Nitrite, UA: NEGATIVE
Protein, UA: NEGATIVE
Spec Grav, UA: 1.025 (ref 1.010–1.025)
Urobilinogen, UA: 0.2 E.U./dL
pH, UA: 6 (ref 5.0–8.0)

## 2022-11-04 NOTE — Assessment & Plan Note (Signed)
Suspect combination of ETD and age related Will treat ETD with flonase (increase) If not signif improvement- then refer to audiology to eval for hearing aides

## 2022-11-04 NOTE — Patient Instructions (Addendum)
Try and get a small meal with protein every 2-3 hours  Soy milk, nut butters , ensure - all good ideas   Urinalysis is clear today   For ears- increase your flonase nasal spray to twice daily for 5-7 days then go back to  once daily This is to get your sinus system open   For runny nose- allegra is helpful   If you develop facial pain and green nasal mucous- let us know (that can be a sign of a sinus infection

## 2022-11-04 NOTE — Progress Notes (Signed)
Subjective:    Patient ID: Paula Jimenez, female    DOB: [birth year], 89 y.o.   MRN: [id number]  HPI Pt presents for c/o ear fullness Also urinary symptoms  Family concerned about poor oral intake   Wt Readings from Last 3 Encounters:  11/04/22 101 lb 2 oz (45.9 kg)  10/13/22 99 lb 2 oz (45 kg)  07/29/22 98 lb (44.5 kg)   17.91 kg/m  Vitals:   11/04/22 1022  BP: 130/78  Pulse: 75  Temp: (!) 97.2 F (36.2 C)  SpO2: 95%   Keppra still causes some balance issues  Neuro said could not lower it more   Ears feel full  Echo sound in L ear-then it moved to the right   Hearing is not great  Cannot hear soft spoken family  Turns TV up loud   Used some otc med for ear wax Thinks it helped   Some pain in ears - not much  Hurts to cough or strain or sneeze  No mucous from nose  No facial pain   Uses saline nasal spray  Uses flonase once daily - occ forgets    Is eating more  Eggs in the am  Protein supplement drink-ensure  Fruit bars   Does not tolerate dairy  Soy milk  Peanut butter crackers   Meals on wheels is not in her area     UA is clear today  Urine cx from 10/13/2022 noted contamination   Results for orders placed or performed in visit on 11/04/22  POCT Urinalysis Dipstick (Automated)  Result Value Ref Range   Color, UA Dark Yellow    Clarity, UA Clear    Glucose, UA Negative Negative   Bilirubin, UA Negative    Ketones, UA Negative    Spec Grav, UA 1.025 1.010 - 1.025   Blood, UA Negative    pH, UA 6.0 5.0 - 8.0   Protein, UA Negative Negative   Urobilinogen, UA 0.2 0.2 or 1.0 E.U./dL   Nitrite, UA Negative    Leukocytes, UA Negative Negative     Patient Active Problem List   Diagnosis Date Noted   Urinary frequency 10/13/2022   Seizure (HCC) 06/25/2022   Physical deconditioning 06/17/2022   History of stroke 06/14/2022   Mitral valve prolapse 06/14/2022   Transportation insecurity 05/26/2022   Underweight 05/26/2022   Meningioma  (HCC) 04/21/2022   Generalized weakness 04/21/2022   Falls frequently 04/07/2022   Fall at home 04/07/2022   Varicose vein of leg 10/18/2020   Fatigue 05/30/2019   Onychomycosis of toenail 03/02/2019   Urethral caruncle 12/10/2018   Elevated random blood glucose level 08/25/2018   Medicare annual wellness visit, subsequent 08/25/2018   Loose stools 06/28/2018   Internal hemorrhoid, bleeding 04/20/2018   Corn of toe 04/20/2018   Ingrown nail of great toe of right foot 11/05/2017   Frequent UTI 02/16/2015   Epistaxis, recurrent 02/28/2014   History of shingles 02/20/2014   Dizziness 01/06/2014   Hearing loss 09/23/2007   History of melanoma 02/25/2007   HLD (hyperlipidemia) 02/25/2007   MITRAL VALVE PROLAPSE 02/25/2007   Allergic rhinitis 02/25/2007   Asthma, chronic 02/25/2007   DEGENERATIVE DISC DISEASE, LUMBOSACRAL SPINE 02/25/2007   Age related osteoporosis 02/25/2007   Essential hypertension 10/26/2006   Past Medical History:  Diagnosis Date   Allergy    Asthma    years ago, 2-3 times a year pt experiences a problem with breathing.   Bursitis, hip  Family history of adverse reaction to anesthesia    DAUGHTER had a reaction to ansethesia   Hyperlipidemia    Hypertension    HYPERTENSION, BENIGN 10/26/2006   Qualifier: Diagnosis of  By: Mills Koller     MITRAL VALVE PROLAPSE 02/25/2007   Qualifier: History of  By: Lindwood Qua CMA, Jerl Santos     Osteopenia 08/01   Osteoporosis    Skin cancer 2015   nose   Past Surgical History:  Procedure Laterality Date   ABDOMINAL HYSTERECTOMY  1971   BREAST BIOPSY Right 06/99   fibrocystic   CYSTOSCOPY WITH BIOPSY N/A 05/31/2015   Procedure: CYSTOSCOPY WITH BIOPSY/ fulgeration;  Surgeon: Vanna Scotland, MD;  Location: [facility];  Service: Urology;  Laterality: N/A;   DENTAL SURGERY     EYE SURGERY Bilateral 2011   June and August of 2011   ORIF RADIAL FRACTURE Left 11/99   arm fracture, radial no surgery   TONSILLECTOMY      Social History   Tobacco Use   Smoking status: Never   Smokeless tobacco: Never  Vaping Use   Vaping Use: Never used  Substance Use Topics   Alcohol use: No    Alcohol/week: 0.0 standard drinks of alcohol   Drug use: No   Family History  Problem Relation Age of Onset   Leukemia Sister    Cancer Sister        leukemia   Heart disease Mother    Diabetes Mother    Heart failure Mother    Stroke Father    Kidney disease Neg Hx    Kidney cancer Neg Hx    Bladder Cancer Neg Hx    Allergies  Allergen Reactions   Clindamycin/Lincomycin Swelling    Throat swelling   Codeine Other (See Comments)    Unknown reaction    Keflex [Cephalexin] Other (See Comments)    Dizziness    Levofloxacin Other (See Comments)    Unknown reaction   Sulfa Antibiotics Other (See Comments)    Unknown reaction   Valtrex [Valacyclovir Hcl] Other (See Comments)    Unknown reaction    Vioxx [Rofecoxib] Other (See Comments)    Unknown reaction   Adhesive [Tape] Rash   Evista [Raloxifene] Rash   Macrobid [Nitrofurantoin] Nausea Only   Penicillins Swelling    Mouth swelling   Ultram [Tramadol] Other (See Comments)    Dizziness    Current Outpatient Medications on File Prior to Visit  Medication Sig Dispense Refill   albuterol (VENTOLIN HFA) 108 (90 Base) MCG/ACT inhaler Inhale 2 puffs into the lungs every 6 (six) hours as needed for wheezing or shortness of breath. 8 g 1   atenolol (TENORMIN) 50 MG tablet Take 1 tablet (50 mg total) by mouth daily. (Patient taking differently: Take 50 mg by mouth in the morning.) 90 tablet 3   AYR SALINE NASAL NA Place 1 spray into the nose daily as needed (allergies).      estradiol (ESTRACE) 0.1 MG/GM vaginal cream Place 1 Applicatorful vaginally 3 (three) times a week. 42.5 g 1   fluticasone (FLONASE) 50 MCG/ACT nasal spray Place 2 sprays into both nostrils daily as needed for allergies or rhinitis. 48 mL 0   levETIRAcetam (KEPPRA) 250 MG tablet TAKE 1/2  TABLET IN THE MORNING AND 1 TABLET IN THE EVENING 135 tablet 1   Probiotic Product (ALIGN PO) Take 1 tablet by mouth daily.     simvastatin (ZOCOR) 20 MG tablet Take 1 tablet (20 mg  total) by mouth daily. (Patient taking differently: Take 20 mg by mouth in the morning.) 90 tablet 3   No current facility-administered medications on file prior to visit.    Review of Systems  Constitutional:  Positive for fatigue. Negative for activity change, appetite change, fever and unexpected weight change.  HENT:  Positive for congestion, hearing loss, postnasal drip and rhinorrhea. Negative for ear discharge, ear pain, sinus pressure and sore throat.        Ear pressure Hearing loss   Eyes:  Negative for pain, redness and visual disturbance.  Respiratory:  Negative for cough, shortness of breath and wheezing.   Cardiovascular:  Negative for chest pain and palpitations.  Gastrointestinal:  Negative for abdominal pain, blood in stool, constipation and diarrhea.  Endocrine: Negative for polydipsia and polyuria.  Genitourinary:  Positive for frequency. Negative for difficulty urinating, dysuria and urgency.  Musculoskeletal:  Negative for arthralgias, back pain and myalgias.  Skin:  Negative for pallor and rash.  Allergic/Immunologic: Negative for environmental allergies.  Neurological:  Positive for weakness and light-headedness. Negative for dizziness, tremors, syncope and headaches.       Light headed from seizure medicine  Generally weak  Hematological:  Negative for adenopathy. Does not bruise/bleed easily.  Psychiatric/Behavioral:  Negative for decreased concentration and dysphoric mood. The patient is not nervous/anxious.        Objective:   Physical Exam Constitutional:      General: She is not in acute distress.    Appearance: Normal appearance. She is well-developed.     Comments: Underweight  Frail appearing  HENT:     Head: Normocephalic and atraumatic.     Right Ear: Tympanic  membrane, ear canal and external ear normal. There is no impacted cerumen.     Left Ear: Tympanic membrane, ear canal and external ear normal. There is no impacted cerumen.     Ears:     Comments: TMs are clear  R TM is more dull than the left      Nose: Congestion and rhinorrhea present.     Mouth/Throat:     Mouth: Mucous membranes are moist.     Comments: Clear pnd Eyes:     General:        Right eye: No discharge.        Left eye: No discharge.     Conjunctiva/sclera: Conjunctivae normal.     Pupils: Pupils are equal, round, and reactive to light.  Neck:     Thyroid: No thyromegaly.     Vascular: No carotid bruit or JVD.  Cardiovascular:     Rate and Rhythm: Normal rate and regular rhythm.     Heart sounds: Normal heart sounds.     No gallop.  Pulmonary:     Effort: Pulmonary effort is normal. No respiratory distress.     Breath sounds: Normal breath sounds. No stridor. No wheezing, rhonchi or rales.  Chest:     Chest wall: No tenderness.  Abdominal:     General: There is no distension or abdominal bruit.     Palpations: Abdomen is soft. There is no mass.     Tenderness: There is no abdominal tenderness. There is no right CVA tenderness or left CVA tenderness.     Comments: No suprapubic tenderness or fullness    Musculoskeletal:     Cervical back: Normal range of motion and neck supple.     Right lower leg: No edema.     Left lower leg: No  edema.  Lymphadenopathy:     Cervical: No cervical adenopathy.  Skin:    General: Skin is warm and dry.     Coloration: Skin is not pale.     Findings: No rash.  Neurological:     Mental Status: She is alert.     Cranial Nerves: No cranial nerve deficit.     Coordination: Coordination normal.     Deep Tendon Reflexes: Reflexes are normal and symmetric. Reflexes normal.  Psychiatric:        Mood and Affect: Mood normal.           Assessment & Plan:   Problem List Items Addressed This Visit       Respiratory    Allergic rhinitis    This may be cause of some ear fullness/etd Noted worse on the R today   Instructed to increase flonase to bid for 5-7 d then return to daily  If hearing does not improve needs audiology eval        Nervous and Auditory   Hearing loss    Suspect combination of ETD and age related Will treat ETD with flonase (increase) If not signif improvement- then refer to audiology to eval for hearing aides         Genitourinary   Frequent UTI    Today ua is clear Per pt was more yellow this am  Will try to increase water intake to keep urine dilute          Other   Underweight    Again discussed need for increase protein calories  Bmi is 17.9 Poor appetite Discussed strategy for smaller mini meals frequently with sources of protein including cashews, nut butters, meat, soy products and ensure if needed  Unclear whether pt has body image issues/ is afraid to gain weight   (she denies that)      Other Visit Diagnoses     History of UTI    -  Primary   Relevant Orders   POCT Urinalysis Dipstick (Automated) (Completed)

## 2022-11-04 NOTE — Assessment & Plan Note (Signed)
This may be cause of some ear fullness/etd Noted worse on the R today   Instructed to increase flonase to bid for 5-7 d then return to daily  If hearing does not improve needs audiology eval

## 2022-11-04 NOTE — Assessment & Plan Note (Signed)
Today ua is clear Per pt was more yellow this am  Will try to increase water intake to keep urine dilute

## 2022-11-04 NOTE — Assessment & Plan Note (Signed)
Again discussed need for increase protein calories  Bmi is 17.9 Poor appetite Discussed strategy for smaller mini meals frequently with sources of protein including cashews, nut butters, meat, soy products and ensure if needed  Unclear whether pt has body image issues/ is afraid to gain weight   (she denies that)

## 2022-11-18 ENCOUNTER — Telehealth: Payer: Self-pay | Admitting: Neurology

## 2022-11-18 NOTE — Telephone Encounter (Signed)
Please have them stop the morning dose and continue with the evening dose.

## 2022-11-18 NOTE — Telephone Encounter (Signed)
Pt's daughter called needing to discuss the pt  levETIRAcetam (KEPPRA) 250 MG tablet  and how it still seems to be to strong. Pt is dizzy and groggy all morning

## 2022-11-18 NOTE — Telephone Encounter (Signed)
Called and spoke to daughter on DPR about Keppra, they have seen PCP for dizziness and changed her BP medications to before bed instead of morning, this has not changed anything. She is still having dizziness after she takes her morning medication until at least lunch before it wears off, they are concerned for falls with her age and report no other med changes recently. Daughter wants to know what can be done for her and if she can be taken off the medication. Please advise

## 2022-11-18 NOTE — Telephone Encounter (Signed)
Called and relayed message to the daughter, who was agreeable to plan and will let us know if she has any issues with lower dose.

## 2022-11-19 ENCOUNTER — Encounter: Payer: Self-pay | Admitting: Family Medicine

## 2022-11-19 ENCOUNTER — Ambulatory Visit (INDEPENDENT_AMBULATORY_CARE_PROVIDER_SITE_OTHER): Payer: Medicare HMO | Admitting: Family Medicine

## 2022-11-19 VITALS — BP 140/72 | HR 85 | Temp 98.1°F | Ht 63.0 in | Wt 102.0 lb

## 2022-11-19 DIAGNOSIS — M5137 Other intervertebral disc degeneration, lumbosacral region: Secondary | ICD-10-CM

## 2022-11-19 DIAGNOSIS — J301 Allergic rhinitis due to pollen: Secondary | ICD-10-CM

## 2022-11-19 DIAGNOSIS — R21 Rash and other nonspecific skin eruption: Secondary | ICD-10-CM | POA: Diagnosis not present

## 2022-11-19 NOTE — Progress Notes (Signed)
Paula Ballo T. Paula Manrique, MD, CAQ Sports Medicine [facility] at [facility] [address] [place], [address]  Phone: [phone]  FAX: [phone]  Paula Jimenez - 89 y.o. female  MRN [id number]  Date of Birth: Sep 15, 1926  Date: 11/19/2022  PCP: [name], MD  Referral: [name], MD  Chief Complaint  Patient presents with   Leg Pain    Bilateral    Insect Bite   Ear Pain    Right   Subjective:   Paula Jimenez is a 89 y.o. very pleasant female patient with Body mass index is 18.07 kg/m. who presents with the following:  Known DDD of l-spine.  She is accompanied by her daughter who provides additional history.  She predominantly comes in for several reasons including concerns about a possible insect bite, and also some continued ear discomfort on the right side.  She does have some ear discomfort on the right side, and this has been present for quite a while.  She does use Flonase intermittently as well as some nasal saline.  Daughter wanted me to check her ear.  Question of a bite -she has a few areas of some slightly pinkish coloration and minor scale on the posterior of the right and upper extremities.  There is also placed on her chest and the primary area of question was on her posterior lateral thigh on the right side, at this point I cannot see anything there.  Has been having some leg pain.  It is down both legs and goes beyond the knee.  No numbness, tingling, or change in strength.  Worried about an itchy bite Seb K on chest  Pain will shoot down her legs, both legs  This morning, she was sitting there and her arms started to do the same thing.  Now not hurting at all.  Yesterday and today, and prior to that she was not really having any pain.   R pinky toe is hurting     Review of Systems is noted in the HPI, as appropriate  Objective:   BP (!) 140/72 (BP Location: Left Arm, Patient Position: Sitting, Cuff Size:  Normal)   Pulse 85   Temp 98.1 F (36.7 C) (Temporal)   Ht 5\' 3"  (1.6 m)   Wt 102 lb (46.3 kg)   SpO2 96%   BMI 18.07 kg/m   GEN: No acute distress; alert,appropriate. PULM: Breathing comfortably in no respiratory distress PSYCH: Normally interactive.    Range of motion at  the waist: Flexion: normal Extension: normal Lateral bending: normal Rotation: all normal  No echymosis or edema Rises to examination table with no difficulty Gait: non antalgic  Inspection/Deformity: N Paraspinus Tenderness: Mild L4-S1 bilaterally  B Ankle Dorsiflexion (L5,4): 5/5 B Great Toe Dorsiflexion (L5,4): 5/5 Heel Walk (L5): WNL Toe Walk (S1): WNL Rise/Squat (L4): WNL  SENSORY B Medial Foot (L4): WNL B Dorsum (L5): WNL B Lateral (S1): WNL Light Touch: WNL  B SLR, seated: neg B SLR, supine: neg B FABER: neg B Reverse FABER: neg B Greater Troch: NT B Log Roll: neg B Sciatic Notch: NT   Small pinkish in appearance rash less than 1 cm in diameter few scattered left posterior upper extremity as well as on 1 on the right.  She also has 1 present on the upper chest.  I do not appreciate anything on the thigh.  Bilateral ENT exam: Ears are clear bilaterally with no wax, and anatomy is  visualized easily with a good cone of light  Laboratory and Imaging Data:  Assessment and Plan:     ICD-10-CM   1. DEGENERATIVE DISC DISEASE, LUMBOSACRAL SPINE  M51.37     2. Rash  R21     3. Seasonal allergic rhinitis due to pollen  J30.1      Acute on chronic low back pain with exacerbation and known multilevel degenerative disc disease.  She has had a flareup this week, and it is improved quite a bit today.  I am can have her use some Tylenol only as needed for pain.  Scattered flat and somewhat scaly rash several places on upper extremities, chest, and this appears to be fairly benign.  At this point I would not do anything over and above observance.  She does have some fullness in the right  ear, with known allergies, and like that this is most likely some eustachian tube dysfunction and allergies.  I am can have her continue the Flonase and be conscious of doing this every day, and they do think this is actually improved since the last time they came in the office several weeks ago.  Medication Management during today's office visit: No orders of the defined types were placed in this encounter.  Medications Discontinued During This Encounter  Medication Reason   levETIRAcetam (KEPPRA) 250 MG tablet Dose change    Orders placed today for conditions managed today: No orders of the defined types were placed in this encounter.   Disposition: No follow-ups on file.  [name] speech-to-text software was used for transcription in this dictation.  Possible transcriptional errors can occur using Animal nutritionist.   Signed,  Elpidio Galea. [name], MD   Outpatient Encounter Medications as of 11/19/2022  Medication Sig   albuterol (VENTOLIN HFA) 108 (90 Base) MCG/ACT inhaler Inhale 2 puffs into the lungs every 6 (six) hours as needed for wheezing or shortness of breath.   atenolol (TENORMIN) 50 MG tablet Take 1 tablet (50 mg total) by mouth daily.   AYR SALINE NASAL NA Place 1 spray into the nose daily as needed (allergies).    estradiol (ESTRACE) 0.1 MG/GM vaginal cream Place 1 Applicatorful vaginally 3 (three) times a week.   fluticasone (FLONASE) 50 MCG/ACT nasal spray Place 2 sprays into both nostrils daily as needed for allergies or rhinitis.   levETIRAcetam (KEPPRA) 250 MG tablet Take 250 mg by mouth every evening.   Probiotic Product (ALIGN PO) Take 1 tablet by mouth daily.   simvastatin (ZOCOR) 20 MG tablet Take 1 tablet (20 mg total) by mouth daily.   [DISCONTINUED] levETIRAcetam (KEPPRA) 250 MG tablet TAKE 1/2 TABLET IN THE MORNING AND 1 TABLET IN THE EVENING   No facility-administered encounter medications on file as of 11/19/2022.

## 2023-01-11 ENCOUNTER — Other Ambulatory Visit: Payer: Self-pay | Admitting: Family Medicine

## 2023-02-09 ENCOUNTER — Ambulatory Visit: Payer: Medicare HMO | Admitting: Neurology

## 2023-03-23 ENCOUNTER — Ambulatory Visit: Payer: Medicare HMO | Admitting: Family Medicine

## 2023-04-16 ENCOUNTER — Other Ambulatory Visit: Payer: Self-pay | Admitting: Family Medicine

## 2023-04-27 ENCOUNTER — Other Ambulatory Visit: Payer: Self-pay | Admitting: Neurology

## 2023-06-07 ENCOUNTER — Other Ambulatory Visit: Payer: Self-pay | Admitting: Family Medicine

## 2023-06-08 NOTE — Telephone Encounter (Signed)
No recent CPE or f/u and no future appts., pt has had a few acute appts., please advise

## 2023-06-08 NOTE — Telephone Encounter (Signed)
Please schedule follow up or annual exam in the next 3 months

## 2023-06-08 NOTE — Telephone Encounter (Signed)
Lvm for patient tcb and schedule 

## 2023-06-12 ENCOUNTER — Ambulatory Visit (INDEPENDENT_AMBULATORY_CARE_PROVIDER_SITE_OTHER): Payer: Medicare HMO | Admitting: Family Medicine

## 2023-06-12 ENCOUNTER — Encounter: Payer: Self-pay | Admitting: Family Medicine

## 2023-06-12 VITALS — BP 118/62 | HR 75 | Temp 98.0°F | Ht 63.0 in | Wt 111.0 lb

## 2023-06-12 DIAGNOSIS — I10 Essential (primary) hypertension: Secondary | ICD-10-CM | POA: Diagnosis not present

## 2023-06-12 DIAGNOSIS — R739 Hyperglycemia, unspecified: Secondary | ICD-10-CM | POA: Diagnosis not present

## 2023-06-12 DIAGNOSIS — R636 Underweight: Secondary | ICD-10-CM

## 2023-06-12 DIAGNOSIS — Z85820 Personal history of malignant melanoma of skin: Secondary | ICD-10-CM

## 2023-06-12 DIAGNOSIS — M810 Age-related osteoporosis without current pathological fracture: Secondary | ICD-10-CM | POA: Diagnosis not present

## 2023-06-12 DIAGNOSIS — D329 Benign neoplasm of meninges, unspecified: Secondary | ICD-10-CM | POA: Diagnosis not present

## 2023-06-12 DIAGNOSIS — Z0000 Encounter for general adult medical examination without abnormal findings: Secondary | ICD-10-CM

## 2023-06-12 DIAGNOSIS — E7800 Pure hypercholesterolemia, unspecified: Secondary | ICD-10-CM

## 2023-06-12 DIAGNOSIS — N390 Urinary tract infection, site not specified: Secondary | ICD-10-CM

## 2023-06-12 DIAGNOSIS — R42 Dizziness and giddiness: Secondary | ICD-10-CM

## 2023-06-12 DIAGNOSIS — Z87898 Personal history of other specified conditions: Secondary | ICD-10-CM

## 2023-06-12 DIAGNOSIS — R296 Repeated falls: Secondary | ICD-10-CM

## 2023-06-12 LAB — LIPID PANEL
Cholesterol: 162 mg/dL (ref 0–200)
HDL: 56.3 mg/dL (ref >39.00)
LDL Cholesterol: 70 mg/dL (ref 0–99)
NonHDL: 105.88
Total CHOL/HDL Ratio: 3
Triglycerides: 181 mg/dL — ABNORMAL HIGH (ref 0.0–149.0)
VLDL: 36.2 mg/dL (ref 0.0–40.0)

## 2023-06-12 LAB — POC URINALSYSI DIPSTICK (AUTOMATED)
Bilirubin, UA: NEGATIVE
Glucose, UA: NEGATIVE
Ketones, UA: NEGATIVE
Leukocytes, UA: NEGATIVE
Nitrite, UA: NEGATIVE
Protein, UA: NEGATIVE
Spec Grav, UA: 1.02 (ref 1.010–1.025)
Urobilinogen, UA: 0.2 U/dL
pH, UA: 5.5 (ref 5.0–8.0)

## 2023-06-12 LAB — CBC WITH DIFFERENTIAL/PLATELET
Basophils Absolute: 0 10*3/uL (ref 0.0–0.1)
Basophils Relative: 0.4 % (ref 0.0–3.0)
Eosinophils Absolute: 0.1 10*3/uL (ref 0.0–0.7)
Eosinophils Relative: 1.1 % (ref 0.0–5.0)
HCT: 42 % (ref 36.0–46.0)
Hemoglobin: 14 g/dL (ref 12.0–15.0)
Lymphocytes Relative: 21.3 % (ref 12.0–46.0)
Lymphs Abs: 1.8 10*3/uL (ref 0.7–4.0)
MCHC: 33.3 g/dL (ref 30.0–36.0)
MCV: 92.7 fL (ref 78.0–100.0)
Monocytes Absolute: 0.7 10*3/uL (ref 0.1–1.0)
Monocytes Relative: 8.5 % (ref 3.0–12.0)
Neutro Abs: 5.7 10*3/uL (ref 1.4–7.7)
Neutrophils Relative %: 68.7 % (ref 43.0–77.0)
Platelets: 197 10*3/uL (ref 150.0–400.0)
RBC: 4.53 Mil/uL (ref 3.87–5.11)
RDW: 14 % (ref 11.5–15.5)
WBC: 8.3 10*3/uL (ref 4.0–10.5)

## 2023-06-12 LAB — COMPREHENSIVE METABOLIC PANEL
ALT: 17 U/L (ref 0–35)
AST: 18 U/L (ref 0–37)
Albumin: 4.1 g/dL (ref 3.5–5.2)
Alkaline Phosphatase: 80 U/L (ref 39–117)
BUN: 24 mg/dL — ABNORMAL HIGH (ref 6–23)
CO2: 33 meq/L — ABNORMAL HIGH (ref 19–32)
Calcium: 9 mg/dL (ref 8.4–10.5)
Chloride: 96 meq/L (ref 96–112)
Creatinine, Ser: 0.52 mg/dL (ref 0.40–1.20)
GFR: 79.42 mL/min (ref >60.00)
Glucose, Bld: 104 mg/dL — ABNORMAL HIGH (ref 70–99)
Potassium: 4.1 meq/L (ref 3.5–5.1)
Sodium: 138 meq/L (ref 135–145)
Total Bilirubin: 0.5 mg/dL (ref 0.2–1.2)
Total Protein: 6.6 g/dL (ref 6.0–8.3)

## 2023-06-12 LAB — HEMOGLOBIN A1C: Hgb A1c MFr Bld: 5.9 % (ref 4.6–6.5)

## 2023-06-12 LAB — TSH: TSH: 1.19 u[IU]/mL (ref 0.35–5.50)

## 2023-06-12 NOTE — Progress Notes (Signed)
Subjective:    Patient ID: Paula Jimenez, female    DOB: 05/17/1927, 89 y.o.   MRN: [id number]  HPI  Here for health maintenance exam and to review chronic medical problems   Wt Readings from Last 3 Encounters:  06/12/23 111 lb (50.3 kg)  11/19/22 102 lb (46.3 kg)  11/04/22 101 lb 2 oz (45.9 kg)   19.66 kg/m  Vitals:   06/12/23 1229  BP: 118/62  Pulse: 75  Temp: 98 F (36.7 C)  SpO2: 95%    Immunization History  Administered Date(s) Administered   Fluad Quad(high Dose 65+) 03/02/2019   Influenza, High Dose Seasonal PF 05/12/2017, 04/20/2018   Influenza,inj,Quad PF,6+ Mos 06/22/2013, 06/26/2014, 04/25/2015, 04/21/2016   Pneumococcal Conjugate-13 06/26/2014   Pneumococcal Polysaccharide-23 11/19/2011   Td 04/24/1998, 11/10/2008   Tdap 11/24/2021   Zoster, Live 08/20/2016    Health Maintenance Due  Topic Date Due   Medicare Annual Wellness (AWV)  12/19/2022   Deals with chronic cough Worse with dairy products    Shingrix - declines   Flu shot -none   Mammogram declines (last in 2012) - declines  Self breast exam- no new lumps    Gyn health-no problems   Colon cancer screening   Bone health  Dexa 11/2011  osteopenia  / declines another  Falls- none  Fractures-none  Supplements -none right now  Past right arm fracture  Last vitamin D Lab Results  Component Value Date   VD25OH 45.80 10/10/2019    Exercise  Walks in the house for exercise / does laps  Has her exercise bands and set up for her door  Has a sitting exercise program from PT but not doing it   Derm care Remote history of melanoma  Appointment is in January Is careful about sun    Mood    12/18/2021   10:52 AM 11/26/2020    8:16 AM 10/21/2019   10:14 AM 08/25/2018   10:22 AM 08/12/2017    4:30 PM  Depression screen PHQ 2/9  Decreased Interest 0 0 0 0 0  Down, Depressed, Hopeless 0 0 0 0 0  PHQ - 2 Score 0 0 0 0 0  Altered sleeping  0   1  Tired, decreased energy  0   1   Change in appetite  0   0  Feeling bad or failure about yourself   0   0  Trouble concentrating  0   0  Moving slowly or fidgety/restless  0   1  Suicidal thoughts  0   0  PHQ-9 Score  0   3  Difficult doing work/chores  Not difficult at all   Not difficult at all   HTN bp is stable today  No cp or palpitations or headaches or edema  No side effects to medicines  BP Readings from Last 3 Encounters:  06/12/23 118/62  11/19/22 (!) 140/72  11/04/22 130/78    Atenolol 50 mg daily   Pulse Readings from Last 3 Encounters:  06/12/23 75  11/19/22 85  11/04/22 75   Lab Results  Component Value Date   NA 138 07/11/2022   K 4.4 07/11/2022   CO2 35 (H) 07/11/2022   GLUCOSE 114 (H) 07/11/2022   BUN 13 07/11/2022   CREATININE 0.50 07/11/2022   CALCIUM 8.7 07/11/2022   GFR 80.69 07/11/2022   GFRNONAA >60 06/19/2022   History of meningioma  On keppra from neuro   History of uti freq in  past  Urinalysis is clear today  Results for orders placed or performed in visit on 06/12/23  POCT Urinalysis Dipstick (Automated)   Collection Time: 06/12/23  2:08 PM  Result Value Ref Range   Color, UA yellow    Clarity, UA clear    Glucose, UA Negative Negative   Bilirubin, UA neg    Ketones, UA neg    Spec Grav, UA 1.020 1.010 - 1.025   Blood, UA meg    pH, UA 5.5 5.0 - 8.0   Protein, UA Negative Negative   Urobilinogen, UA 0.2 0.2 or 1.0 E.U./dL   Nitrite, UA neg    Leukocytes, UA Negative Negative     Glucose  Lab Results  Component Value Date   HGBA1C 6.1 (H) 06/14/2022   Due for labs   Hyperlipidemia Lab Results  Component Value Date   CHOL 157 06/14/2022   HDL 45 06/14/2022   LDLCALC 97 06/14/2022   LDLDIRECT 159.5 02/19/2009   TRIG 77 06/14/2022   CHOLHDL 3.5 06/14/2022   Simvastatin 20 mg daily    Patient Active Problem List   Diagnosis Date Noted   Urinary frequency 10/13/2022   History of seizure 06/25/2022   Physical deconditioning 06/17/2022   History  of stroke 06/14/2022   Mitral valve prolapse 06/14/2022   Transportation insecurity 05/26/2022   Underweight 05/26/2022   Meningioma (HCC) 04/21/2022   Generalized weakness 04/21/2022   Falls frequently 04/07/2022   Varicose vein of leg 10/18/2020   Onychomycosis of toenail 03/02/2019   Urethral caruncle 12/10/2018   Elevated random blood glucose level 08/25/2018   Medicare annual wellness visit, subsequent 08/25/2018   Loose stools 06/28/2018   Internal hemorrhoid, bleeding 04/20/2018   Corn of toe 04/20/2018   Ingrown nail of great toe of right foot 11/05/2017   Routine general medical examination at a health care facility 08/07/2016   Frequent UTI 02/16/2015   Epistaxis, recurrent 02/28/2014   History of shingles 02/20/2014   Dizziness 01/06/2014   Hearing loss 09/23/2007   History of melanoma 02/25/2007   HLD (hyperlipidemia) 02/25/2007   MITRAL VALVE PROLAPSE 02/25/2007   Allergic rhinitis 02/25/2007   Asthma, chronic 02/25/2007   DEGENERATIVE DISC DISEASE, LUMBOSACRAL SPINE 02/25/2007   Age related osteoporosis 02/25/2007   Essential hypertension 10/26/2006   Past Medical History:  Diagnosis Date   Allergy    Asthma    years ago, 2-3 times a year pt experiences a problem with breathing.   Bursitis, hip    Family history of adverse reaction to anesthesia    DAUGHTER had a reaction to ansethesia   Hyperlipidemia    Hypertension    HYPERTENSION, BENIGN 10/26/2006   Qualifier: Diagnosis of  By: Mills Koller     MITRAL VALVE PROLAPSE 02/25/2007   Qualifier: History of  By: Lindwood Qua CMA, Jerl Santos     Osteopenia 08/01   Osteoporosis    Skin cancer 2015   nose   Past Surgical History:  Procedure Laterality Date   ABDOMINAL HYSTERECTOMY  1971   BREAST BIOPSY Right 06/99   fibrocystic   CYSTOSCOPY WITH BIOPSY N/A 05/31/2015   Procedure: CYSTOSCOPY WITH BIOPSY/ fulgeration;  Surgeon: Vanna Scotland, MD;  Location: [facility] ORS;  Service: Urology;  Laterality: N/A;    DENTAL SURGERY     EYE SURGERY Bilateral 2011   June and August of 2011   ORIF RADIAL FRACTURE Left 11/99   arm fracture, radial no surgery   TONSILLECTOMY     Social  History   Tobacco Use   Smoking status: Never   Smokeless tobacco: Never  Vaping Use   Vaping status: Never Used  Substance Use Topics   Alcohol use: No    Alcohol/week: 0.0 standard drinks of alcohol   Drug use: No   Family History  Problem Relation Age of Onset   Leukemia Sister    Cancer Sister        leukemia   Heart disease Mother    Diabetes Mother    Heart failure Mother    Stroke Father    Kidney disease Neg Hx    Kidney cancer Neg Hx    Bladder Cancer Neg Hx    Allergies  Allergen Reactions   Clindamycin/Lincomycin Swelling    Throat swelling   Codeine Other (See Comments)    Unknown reaction    Keflex [Cephalexin] Other (See Comments)    Dizziness    Levofloxacin Other (See Comments)    Unknown reaction   Sulfa Antibiotics Other (See Comments)    Unknown reaction   Valtrex [Valacyclovir Hcl] Other (See Comments)    Unknown reaction    Vioxx [Rofecoxib] Other (See Comments)    Unknown reaction   Adhesive [Tape] Rash   Evista [Raloxifene] Rash   Macrobid [Nitrofurantoin] Nausea Only   Penicillins Swelling    Mouth swelling   Ultram [Tramadol] Other (See Comments)    Dizziness    Current Outpatient Medications on File Prior to Visit  Medication Sig Dispense Refill   atenolol (TENORMIN) 50 MG tablet TAKE 1 TABLET BY MOUTH EVERY DAY 90 tablet 0   AYR SALINE NASAL NA Place 1 spray into the nose daily as needed (allergies).      estradiol (ESTRACE) 0.1 MG/GM vaginal cream Place 1 Applicatorful vaginally 3 (three) times a week. 42.5 g 1   fluticasone (FLONASE) 50 MCG/ACT nasal spray PLACE 2 SPRAYS INTO BOTH NOSTRILS DAILY AS NEEDED FOR ALLERGIES OR RHINITIS. 48 mL 0   levETIRAcetam (KEPPRA) 250 MG tablet TAKE 1/2 TABLET IN THE MORNING AND 1 TABLET IN THE EVENING 135 tablet 1   Probiotic  Product (ALIGN PO) Take 1 tablet by mouth daily.     simvastatin (ZOCOR) 20 MG tablet Take 1 tablet (20 mg total) by mouth daily. 90 tablet 3   albuterol (VENTOLIN HFA) 108 (90 Base) MCG/ACT inhaler Inhale 2 puffs into the lungs every 6 (six) hours as needed for wheezing or shortness of breath. (Patient not taking: Reported on 06/12/2023) 8 g 1   No current facility-administered medications on file prior to visit.    Review of Systems  Constitutional:  Negative for activity change, appetite change, fatigue, fever and unexpected weight change.  HENT:  Positive for postnasal drip. Negative for congestion, ear pain, rhinorrhea, sinus pressure and sore throat.   Eyes:  Negative for pain, redness and visual disturbance.  Respiratory:  Negative for cough, shortness of breath and wheezing.        Chronic cough  Only wheezes if sick   Will follow up to discuss further   Cardiovascular:  Negative for chest pain and palpitations.  Gastrointestinal:  Negative for abdominal pain, blood in stool, constipation and diarrhea.  Endocrine: Negative for polydipsia and polyuria.  Genitourinary:  Negative for dysuria, frequency and urgency.  Musculoskeletal:  Positive for arthralgias. Negative for back pain and myalgias.  Skin:  Negative for pallor and rash.  Allergic/Immunologic: Negative for environmental allergies.  Neurological:  Negative for dizziness, syncope and headaches.  Occational dizzy  Not today  Hematological:  Negative for adenopathy. Does not bruise/bleed easily.  Psychiatric/Behavioral:  Negative for decreased concentration and dysphoric mood. The patient is not nervous/anxious.        Objective:   Physical Exam Constitutional:      General: She is not in acute distress.    Appearance: Normal appearance. She is well-developed and normal weight. She is not ill-appearing or diaphoretic.  HENT:     Head: Normocephalic and atraumatic.     Right Ear: Tympanic membrane, ear canal and  external ear normal.     Left Ear: Tympanic membrane, ear canal and external ear normal.     Nose: Nose normal. No congestion.     Mouth/Throat:     Mouth: Mucous membranes are moist.     Pharynx: Oropharynx is clear. No posterior oropharyngeal erythema.  Eyes:     General: No scleral icterus.    Extraocular Movements: Extraocular movements intact.     Conjunctiva/sclera: Conjunctivae normal.     Pupils: Pupils are equal, round, and reactive to light.  Neck:     Thyroid: No thyromegaly.     Vascular: No carotid bruit or JVD.  Cardiovascular:     Rate and Rhythm: Normal rate and regular rhythm.     Pulses: Normal pulses.     Heart sounds: Normal heart sounds.     No gallop.  Pulmonary:     Effort: Pulmonary effort is normal. No respiratory distress.     Breath sounds: Normal breath sounds. No wheezing.     Comments: Good air exch Chest:     Chest wall: No tenderness.  Abdominal:     General: Bowel sounds are normal. There is no distension or abdominal bruit.     Palpations: Abdomen is soft. There is no mass.     Tenderness: There is no abdominal tenderness.     Hernia: No hernia is present.  Genitourinary:    Comments: Breast exam: No mass, nodules, thickening, tenderness, bulging, retraction, inflamation, nipple discharge or skin changes noted.  No axillary or clavicular LA.     Musculoskeletal:        General: No tenderness. Normal range of motion.     Cervical back: Normal range of motion and neck supple. No rigidity. No muscular tenderness.     Right lower leg: No edema.     Left lower leg: No edema.     Comments: No kyphosis  Good posture   Lymphadenopathy:     Cervical: No cervical adenopathy.  Skin:    General: Skin is warm and dry.     Coloration: Skin is not pale.     Findings: No erythema or rash.     Comments: Solar lentigines diffusely Scattered sks   Neurological:     Mental Status: She is alert. Mental status is at baseline.     Cranial Nerves: No cranial  nerve deficit.     Motor: No abnormal muscle tone.     Coordination: Coordination normal.     Gait: Gait normal.     Deep Tendon Reflexes: Reflexes are normal and symmetric. Reflexes normal.  Psychiatric:        Mood and Affect: Mood normal.        Cognition and Memory: Cognition and memory normal.           Assessment & Plan:   Problem List Items Addressed This Visit       Cardiovascular and Mediastinum   Essential  hypertension   BP: 118/62   Taking atenolol 50 mg daily  Pulse is normal  No dizziness but watches out for it        Relevant Orders   TSH   Lipid panel   Comprehensive metabolic panel   CBC with Differential/Platelet     Nervous and Auditory   Meningioma (HCC)   Continues keppra and neuro follow up  No new clinical changes or seizures         Musculoskeletal and Integument   Age related osteoporosis   Continues to decline dexa  No new falls or fracture  Remote history of arm fracture Encouraged to start vit d  Discussed fall prevention, supplements and exercise for bone density          Genitourinary   Frequent UTI   Pt requested urinalysis -this is negative  No symptoms today  Uses estrace vaginal cream         Relevant Orders   POCT Urinalysis Dipstick (Automated) (Completed)     Other   Underweight   Weight is up and bmi over 19 now  Commended on better nutrition       Routine general medical examination at a health care facility - Primary   Reviewed health habits including diet and exercise and skin cancer prevention Reviewed appropriate screening tests for age  Also reviewed health mt list, fam hx and immunization status , as well as social and family history   See HPI Labs reviewed and ordered Declines shingrix, flu shot, other vaccines  Declines mammogram and dexa  Does self breast exams  Discussed fall prevention, supplements and exercise for bone density  Mood is good  Utd derm care   Health Maintenance   Topic Date Due   Zoster (Shingles) Vaccine (1 of 2) 10/30/1978   Medicare Annual Wellness Visit  12/19/2022   COVID-19 Vaccine (1 - 2024-25 season) 06/28/2023*   Flu Shot  09/21/2023*   DTaP/Tdap/Td vaccine (4 - Td or Tdap) 11/25/2031   Pneumonia Vaccine  Completed   DEXA scan (bone density measurement)  Completed   HPV Vaccine  Aged Out  *Topic was postponed. The date shown is not the original due date.         HLD (hyperlipidemia)   Lipid panel today   Disc goals for lipids and reasons to control them Rev last labs with pt Rev low sat fat diet in detail  Continues simvastatin 20 mg daily        History of seizure   In setting of meningioma  Sees neuro  No clinical changes  Continues keppra 125 mg half in am and one in evening  Tolerating this well      History of melanoma   Continues yearly dermatology visits   No new lesions       Falls frequently   No recent falls      Elevated random blood glucose level   A1c today  Encouraged high protein low sugar diet       Relevant Orders   Hemoglobin A1c   Dizziness   Not dizzy today  Overall better  Is careful with ambulation if she does feel dizzy

## 2023-06-12 NOTE — Assessment & Plan Note (Signed)
A1c today  Encouraged high protein low sugar diet

## 2023-06-12 NOTE — Assessment & Plan Note (Signed)
Continues to decline dexa  No new falls or fracture  Remote history of arm fracture Encouraged to start vit d  Discussed fall prevention, supplements and exercise for bone density

## 2023-06-12 NOTE — Assessment & Plan Note (Signed)
Continues keppra and neuro follow up  No new clinical changes or seizures

## 2023-06-12 NOTE — Assessment & Plan Note (Signed)
Reviewed health habits including diet and exercise and skin cancer prevention Reviewed appropriate screening tests for age  Also reviewed health mt list, fam hx and immunization status , as well as social and family history   See HPI Labs reviewed and ordered Declines shingrix, flu shot, other vaccines  Declines mammogram and dexa  Does self breast exams  Discussed fall prevention, supplements and exercise for bone density  Mood is good  Utd derm care   Health Maintenance  Topic Date Due   Zoster (Shingles) Vaccine (1 of 2) 10/30/1978   Medicare Annual Wellness Visit  12/19/2022   COVID-19 Vaccine (1 - 2024-25 season) 06/28/2023*   Flu Shot  09/21/2023*   DTaP/Tdap/Td vaccine (4 - Td or Tdap) 11/25/2031   Pneumonia Vaccine  Completed   DEXA scan (bone density measurement)  Completed   HPV Vaccine  Aged Out  *Topic was postponed. The date shown is not the original due date.

## 2023-06-12 NOTE — Patient Instructions (Addendum)
I want you to take at least 2000 international units of vitamin D daily for bone and overall health  Over the counter   Get calcium from your diet   Stay active  Add some strength training to your routine, this is important for bone and brain health and can reduce your risk of falls and help your body use insulin properly and regulate weight  Light weights, exercise bands , and internet videos are a good way to start  Yoga (chair or regular), machines , floor exercises or a gym with machines are also good options    Labs today  Urinalysis today

## 2023-06-12 NOTE — Assessment & Plan Note (Signed)
Weight is up and bmi over 19 now  Commended on better nutrition

## 2023-06-12 NOTE — Assessment & Plan Note (Signed)
BP: 118/62   Taking atenolol 50 mg daily  Pulse is normal  No dizziness but watches out for it

## 2023-06-12 NOTE — Progress Notes (Deleted)
Subjective:    Patient ID: Paula Jimenez, female    DOB: 1926-06-25, 89 y.o.   MRN: [id number]  HPI  Wt Readings from Last 3 Encounters:  11/19/22 102 lb (46.3 kg)  11/04/22 101 lb 2 oz (45.9 kg)  10/13/22 99 lb 2 oz (45 kg)      There were no vitals filed for this visit.  Pt presents with c/o  Symptoms of thrush  Urinary symptoms     Patient Active Problem List   Diagnosis Date Noted   Urinary frequency 10/13/2022   Seizure (HCC) 06/25/2022   Physical deconditioning 06/17/2022   History of stroke 06/14/2022   Mitral valve prolapse 06/14/2022   Transportation insecurity 05/26/2022   Underweight 05/26/2022   Meningioma (HCC) 04/21/2022   Generalized weakness 04/21/2022   Falls frequently 04/07/2022   Fall at home 04/07/2022   Varicose vein of leg 10/18/2020   Fatigue 05/30/2019   Onychomycosis of toenail 03/02/2019   Urethral caruncle 12/10/2018   Elevated random blood glucose level 08/25/2018   Medicare annual wellness visit, subsequent 08/25/2018   Loose stools 06/28/2018   Internal hemorrhoid, bleeding 04/20/2018   Corn of toe 04/20/2018   Ingrown nail of great toe of right foot 11/05/2017   Frequent UTI 02/16/2015   Epistaxis, recurrent 02/28/2014   History of shingles 02/20/2014   Dizziness 01/06/2014   Hearing loss 09/23/2007   History of melanoma 02/25/2007   HLD (hyperlipidemia) 02/25/2007   MITRAL VALVE PROLAPSE 02/25/2007   Allergic rhinitis 02/25/2007   Asthma, chronic 02/25/2007   DEGENERATIVE DISC DISEASE, LUMBOSACRAL SPINE 02/25/2007   Age related osteoporosis 02/25/2007   Essential hypertension 10/26/2006   Past Medical History:  Diagnosis Date   Allergy    Asthma    years ago, 2-3 times a year pt experiences a problem with breathing.   Bursitis, hip    Family history of adverse reaction to anesthesia    DAUGHTER had a reaction to ansethesia   Hyperlipidemia    Hypertension    HYPERTENSION, BENIGN 10/26/2006   Qualifier: Diagnosis  of  By: Mills Koller     MITRAL VALVE PROLAPSE 02/25/2007   Qualifier: History of  By: Lindwood Qua CMA, Jerl Santos     Osteopenia 08/01   Osteoporosis    Skin cancer 2015   nose   Past Surgical History:  Procedure Laterality Date   ABDOMINAL HYSTERECTOMY  1971   BREAST BIOPSY Right 06/99   fibrocystic   CYSTOSCOPY WITH BIOPSY N/A 05/31/2015   Procedure: CYSTOSCOPY WITH BIOPSY/ fulgeration;  Surgeon: Vanna Scotland, MD;  Location: [facility] ORS;  Service: Urology;  Laterality: N/A;   DENTAL SURGERY     EYE SURGERY Bilateral 2011   June and August of 2011   ORIF RADIAL FRACTURE Left 11/99   arm fracture, radial no surgery   TONSILLECTOMY     Social History   Tobacco Use   Smoking status: Never   Smokeless tobacco: Never  Vaping Use   Vaping status: Never Used  Substance Use Topics   Alcohol use: No    Alcohol/week: 0.0 standard drinks of alcohol   Drug use: No   Family History  Problem Relation Age of Onset   Leukemia Sister    Cancer Sister        leukemia   Heart disease Mother    Diabetes Mother    Heart failure Mother    Stroke Father    Kidney disease Neg Hx    Kidney cancer Neg  Hx    Bladder Cancer Neg Hx    Allergies  Allergen Reactions   Clindamycin/Lincomycin Swelling    Throat swelling   Codeine Other (See Comments)    Unknown reaction    Keflex [Cephalexin] Other (See Comments)    Dizziness    Levofloxacin Other (See Comments)    Unknown reaction   Sulfa Antibiotics Other (See Comments)    Unknown reaction   Valtrex [Valacyclovir Hcl] Other (See Comments)    Unknown reaction    Vioxx [Rofecoxib] Other (See Comments)    Unknown reaction   Adhesive [Tape] Rash   Evista [Raloxifene] Rash   Macrobid [Nitrofurantoin] Nausea Only   Penicillins Swelling    Mouth swelling   Ultram [Tramadol] Other (See Comments)    Dizziness    Current Outpatient Medications on File Prior to Visit  Medication Sig Dispense Refill   albuterol (VENTOLIN HFA) 108 (90  Base) MCG/ACT inhaler Inhale 2 puffs into the lungs every 6 (six) hours as needed for wheezing or shortness of breath. 8 g 1   atenolol (TENORMIN) 50 MG tablet TAKE 1 TABLET BY MOUTH EVERY DAY 90 tablet 0   AYR SALINE NASAL NA Place 1 spray into the nose daily as needed (allergies).      estradiol (ESTRACE) 0.1 MG/GM vaginal cream Place 1 Applicatorful vaginally 3 (three) times a week. 42.5 g 1   fluticasone (FLONASE) 50 MCG/ACT nasal spray PLACE 2 SPRAYS INTO BOTH NOSTRILS DAILY AS NEEDED FOR ALLERGIES OR RHINITIS. 48 mL 0   levETIRAcetam (KEPPRA) 250 MG tablet TAKE 1/2 TABLET IN THE MORNING AND 1 TABLET IN THE EVENING 135 tablet 1   Probiotic Product (ALIGN PO) Take 1 tablet by mouth daily.     simvastatin (ZOCOR) 20 MG tablet Take 1 tablet (20 mg total) by mouth daily. 90 tablet 3   No current facility-administered medications on file prior to visit.    Review of Systems     Objective:   Physical Exam        Assessment & Plan:   Problem List Items Addressed This Visit   None

## 2023-06-12 NOTE — Assessment & Plan Note (Signed)
No recent falls 

## 2023-06-12 NOTE — Assessment & Plan Note (Signed)
Continues yearly dermatology visits   No new lesions

## 2023-06-12 NOTE — Assessment & Plan Note (Signed)
In setting of meningioma  Sees neuro  No clinical changes  Continues keppra 125 mg half in am and one in evening  Tolerating this well

## 2023-06-12 NOTE — Assessment & Plan Note (Signed)
Not dizzy today  Overall better  Is careful with ambulation if she does feel dizzy

## 2023-06-12 NOTE — Assessment & Plan Note (Addendum)
Lipid panel today   Disc goals for lipids and reasons to control them Rev last labs with pt Rev low sat fat diet in detail  Continues simvastatin 20 mg daily

## 2023-06-12 NOTE — Assessment & Plan Note (Addendum)
Pt requested urinalysis -this is negative  No symptoms today  Uses estrace vaginal cream

## 2023-07-09 DIAGNOSIS — L570 Actinic keratosis: Secondary | ICD-10-CM | POA: Diagnosis not present

## 2023-07-09 DIAGNOSIS — L218 Other seborrheic dermatitis: Secondary | ICD-10-CM | POA: Diagnosis not present

## 2023-07-09 DIAGNOSIS — D225 Melanocytic nevi of trunk: Secondary | ICD-10-CM | POA: Diagnosis not present

## 2023-07-09 DIAGNOSIS — Z85828 Personal history of other malignant neoplasm of skin: Secondary | ICD-10-CM | POA: Diagnosis not present

## 2023-07-09 DIAGNOSIS — D2271 Melanocytic nevi of right lower limb, including hip: Secondary | ICD-10-CM | POA: Diagnosis not present

## 2023-07-09 DIAGNOSIS — L821 Other seborrheic keratosis: Secondary | ICD-10-CM | POA: Diagnosis not present

## 2023-07-12 ENCOUNTER — Other Ambulatory Visit: Payer: Self-pay | Admitting: Family Medicine

## 2023-07-14 ENCOUNTER — Other Ambulatory Visit: Payer: Self-pay | Admitting: Family Medicine

## 2023-07-14 ENCOUNTER — Telehealth: Payer: Self-pay | Admitting: Neurology

## 2023-07-14 NOTE — Telephone Encounter (Signed)
Pt's daughter r/s appt. Wait listed

## 2023-07-29 ENCOUNTER — Ambulatory Visit: Payer: Medicare HMO

## 2023-07-29 VITALS — Ht 63.0 in | Wt 111.0 lb

## 2023-07-29 DIAGNOSIS — Z0000 Encounter for general adult medical examination without abnormal findings: Secondary | ICD-10-CM | POA: Diagnosis not present

## 2023-07-29 NOTE — Progress Notes (Signed)
 Subjective:   Paula Jimenez is a 89 y.o. female who presents for Medicare Annual (Subsequent) preventive examination.  Visit Complete: Virtual I connected with  [name] on 07/29/23 by a audio enabled telemedicine application and verified that I am speaking with the correct person using two identifiers.  Patient Location: Home  Provider Location: Office/Clinic  I discussed the limitations of evaluation and management by telemedicine. The patient expressed understanding and agreed to proceed.  Vital Signs: Because this visit was a virtual/telehealth visit, some criteria may be missing or patient reported. Any vitals not documented were not able to be obtained and vitals that have been documented are patient reported.  Patient Medicare AWV questionnaire was completed by the patient on (not done); I have confirmed that all information answered by patient is correct and no changes since this date.  Cardiac Risk Factors include: advanced age (>8men, >69 women);dyslipidemia;hypertension;sedentary lifestyle    Objective:    Today's Vitals   07/29/23 0917  Weight: 111 lb (50.3 kg)  Height: 5' 3 (1.6 m)   Body mass index is 19.66 kg/m.     07/29/2023    9:31 AM 06/14/2022    2:55 PM 12/18/2021   10:50 AM 11/26/2020    8:12 AM 08/12/2017    4:27 PM 08/08/2016    9:28 AM 08/07/2015   11:00 AM  Advanced Directives  Does Patient Have a Medical Advance Directive? Yes No Yes Yes Yes Yes Yes  Type of Estate Agent of [place];Living will  Living will Healthcare Power of [place];Living will Healthcare Power of [place];Living will Healthcare Power of [place];Living will Healthcare Power of [place];Living will  Does patient want to make changes to medical advance directive?       No - Patient declined  Copy of Healthcare Power of Attorney in Chart? Yes - validated most recent copy scanned in chart (See row information)   No - copy requested No - copy requested  No - copy requested No - copy requested  Would patient like information on creating a medical advance directive?  No - Patient declined         Current Medications (verified) Outpatient Encounter Medications as of 07/29/2023  Medication Sig   albuterol  (VENTOLIN  HFA) 108 (90 Base) MCG/ACT inhaler Inhale 2 puffs into the lungs every 6 (six) hours as needed for wheezing or shortness of breath.   atenolol  (TENORMIN ) 50 MG tablet TAKE 1 TABLET BY MOUTH EVERY DAY   AYR SALINE NASAL NA Place 1 spray into the nose daily as needed (allergies).    estradiol  (ESTRACE ) 0.1 MG/GM vaginal cream Place 1 Applicatorful vaginally 3 (three) times a week.   fluticasone  (FLONASE ) 50 MCG/ACT nasal spray PLACE 2 SPRAYS INTO BOTH NOSTRILS DAILY AS NEEDED FOR ALLERGIES OR RHINITIS.   levETIRAcetam  (KEPPRA ) 250 MG tablet TAKE 1/2 TABLET IN THE MORNING AND 1 TABLET IN THE EVENING   Probiotic Product (ALIGN PO) Take 1 tablet by mouth daily.   simvastatin  (ZOCOR ) 20 MG tablet TAKE 1 TABLET BY MOUTH EVERY DAY   No facility-administered encounter medications on file as of 07/29/2023.    Allergies (verified) Clindamycin /lincomycin, Codeine, Keflex  [cephalexin ], Levofloxacin , Sulfa  antibiotics, Valtrex  [valacyclovir  hcl], Vioxx [rofecoxib], Adhesive [tape], Evista [raloxifene], Macrobid  [nitrofurantoin ], Penicillins, and Ultram  [tramadol ]   History: Past Medical History:  Diagnosis Date   Allergy    Asthma    years ago, 2-3 times a year pt experiences a problem with breathing.   Bursitis, hip  Family history of adverse reaction to anesthesia    DAUGHTER had a reaction to ansethesia   Hyperlipidemia    Hypertension    HYPERTENSION, BENIGN 10/26/2006   Qualifier: Diagnosis of  By: Hope Chessman     MITRAL VALVE PROLAPSE 02/25/2007   Qualifier: History of  By: Cecilie CMA, NANNIE Ivy     Osteopenia 08/01   Osteoporosis    Skin cancer 2015   nose   Past Surgical History:  Procedure Laterality Date   ABDOMINAL  HYSTERECTOMY  1971   BREAST BIOPSY Right 06/99   fibrocystic   CYSTOSCOPY WITH BIOPSY N/A 05/31/2015   Procedure: CYSTOSCOPY WITH BIOPSY/ fulgeration;  Surgeon: Rosina Riis, MD;  Location: [facility] OR[facility];  Service: Urology;  Laterality: N/A;   DENTAL SURGERY     EYE SURGERY Bilateral 2011   June and August of 2011   ORIF RADIAL FRACTURE Left 11/99   arm fracture, radial no surgery   TONSILLECTOMY     Family History  Problem Relation Age of Onset   Leukemia Sister    Cancer Sister        leukemia   Heart disease Mother    Diabetes Mother    Heart failure Mother    Stroke Father    Kidney disease Neg Hx    Kidney cancer Neg Hx    Bladder Cancer Neg Hx    Social History   Socioeconomic History   Marital status: Widowed    Spouse name: Not on file   Number of children: 4   Years of education: Not on file   Highest education level: Not on file  Occupational History   Occupation: retired  Tobacco Use   Smoking status: Never   Smokeless tobacco: Never  Vaping Use   Vaping status: Never Used  Substance and Sexual Activity   Alcohol  use: No    Alcohol /week: 0.0 standard drinks of alcohol    Drug use: No   Sexual activity: Not Currently  Other Topics Concern   Not on file  Social History Narrative   caring for daughter with leg injury   caring for sister with leukemia          Social Drivers of Health   Financial Resource Strain: Low Risk  (07/29/2023)   Overall Financial Resource Strain (CARDIA)    Difficulty of Paying Living Expenses: Not hard at all  Food Insecurity: No Food Insecurity (07/29/2023)   Hunger Vital Sign    Worried About Running Out of Food in the Last Year: Never true    Ran Out of Food in the Last Year: Never true  Transportation Needs: No Transportation Needs (07/29/2023)   PRAPARE - Administrator, Civil Service (Medical): No    Lack of Transportation (Non-Medical): No  Physical Activity: Inactive (07/29/2023)   Exercise Vital Sign    Days  of Exercise per Week: 0 days    Minutes of Exercise per Session: 0 min  Stress: No Stress Concern Present (07/29/2023)   [employer] of Occupational Health - Occupational Stress Questionnaire    Feeling of Stress : Not at all  Social Connections: Socially Isolated (07/29/2023)   Social Connection and Isolation Panel [NHANES]    Frequency of Communication with Friends and Family: More than three times a week    Frequency of Social Gatherings with Friends and Family: Never    Attends Religious Services: Never    Database Administrator or Organizations: No    Attends Club or  Organization Meetings: Never    Marital Status: Widowed    Tobacco Counseling Counseling given: Not Answered   Clinical Intake:  Pre-visit preparation completed: Yes  Pain : No/denies pain    BMI - recorded: 19.66 Nutritional Status: BMI of 19-24  Normal Nutritional Risks: None Diabetes: No  How often do you need to have someone help you when you read instructions, pamphlets, or other written materials from your doctor or pharmacy?: 1 - Never  Interpreter Needed?: No  Comments: lives with daughter Information entered by :: B.Javed Cotto,LPN   Activities of Daily Living    07/29/2023    9:31 AM  In your present state of health, do you have any difficulty performing the following activities:  Hearing? 0  Vision? 0  Difficulty concentrating or making decisions? 1  Walking or climbing stairs? 1  Dressing or bathing? 0  Doing errands, shopping? 1  Preparing Food and eating ? N  Using the Toilet? N  In the past six months, have you accidently leaked urine? N  Do you have problems with loss of bowel control? N  Managing your Medications? Y  Comment daughter does  Managing your Finances? Y  Housekeeping or managing your Housekeeping? Y    Patient Care Team: Tower, Laine LABOR, MD as PCP - Diedre Elnor Mulders, MD as Referring Physician (Ophthalmology)  Indicate any recent Medical Services you may  have received from other than [facility] providers in the past year (date may be approximate).     Assessment:   This is a routine wellness examination for Jezebel.  Hearing/Vision screen Hearing Screening - Comments:: Pt says her hearing is alright Vision Screening - Comments:: Pt says her vision is good;readers only   Goals Addressed             This Visit's Progress    Increase water intake       07/29/23-I will continue to consume at least 64 oz of water daily.      Patient Stated   On track    11/26/2020, I will maintain and continue medications as prescribed.     Patient Stated   On track    07/29/2023, no goals       Depression Screen    07/29/2023    9:26 AM 12/18/2021   10:52 AM 11/26/2020    8:16 AM 10/21/2019   10:14 AM 08/25/2018   10:22 AM 08/12/2017    4:30 PM 08/08/2016    9:27 AM  PHQ 2/9 Scores  PHQ - 2 Score 0 0 0 0 0 0 0  PHQ- 9 Score   0   3     Fall Risk    07/29/2023    9:22 AM 06/12/2023   12:31 PM 11/19/2022    2:10 PM 12/18/2021   10:50 AM 11/26/2020    8:15 AM  Fall Risk   Falls in the past year? 0 0 1 1 1   Comment    tripped on a rug at church   Number falls in past yr: 0 0 0 0 0  Injury with Fall? 0 0 1 1 0  Comment    broke arm   Risk for fall due to : No Fall Risks No Fall Risks History of fall(s);Impaired mobility Medication side effect;History of fall(s) Medication side effect  Follow up Education provided;Falls prevention discussed Falls evaluation completed Falls evaluation completed Falls evaluation completed;Education provided;Falls prevention discussed Falls evaluation completed;Falls prevention discussed    MEDICARE RISK AT HOME: Medicare  Risk at Home Any stairs in or around the home?: No If so, are there any without handrails?: No Home free of loose throw rugs in walkways, pet beds, electrical cords, etc?: Yes Adequate lighting in your home to reduce risk of falls?: Yes Life alert?: No Use of a cane, walker or w/c?: Yes Grab bars in the  bathroom?: Yes Shower chair or bench in shower?: Yes Elevated toilet seat or a handicapped toilet?: Yes  TIMED UP AND GO:  Was the test performed?  No    Cognitive Function:    11/26/2020    8:20 AM 08/12/2017    4:30 PM 08/08/2016    9:28 AM 08/07/2015   11:04 AM  MMSE - Mini Mental State Exam  Not completed: Unable to complete     Orientation to time  5 5 5   Orientation to Place  5 5 5   Registration  3 3 3   Attention/ Calculation  0 0 5  Recall  3 3 3   Language- name 2 objects  0 0   Language- repeat  1 1 1   Language- follow 3 step command  3 3 3   Language- read & follow direction  0 0 1  Write a sentence  0 0   Copy design  0 0 1  Total score  20 20         07/29/2023    9:34 AM 12/18/2021   10:57 AM  6CIT Screen  What Year? 0 points 0 points  What month? 0 points 0 points  What time? 0 points 0 points  Count back from 20 0 points 2 points  Months in reverse 0 points 4 points  Repeat phrase 0 points 4 points  Total Score 0 points 10 points    Immunizations Immunization History  Administered Date(s) Administered   Fluad Quad(high Dose 65+) 03/02/2019   Influenza, High Dose Seasonal PF 05/12/2017, 04/20/2018   Influenza,inj,Quad PF,6+ Mos 06/22/2013, 06/26/2014, 04/25/2015, 04/21/2016   Pneumococcal Conjugate-13 06/26/2014   Pneumococcal Polysaccharide-23 11/19/2011   Td 04/24/1998, 11/10/2008   Tdap 11/24/2021   Zoster, Live 08/20/2016    TDAP status: Up to date  Flu Vaccine status: Declined, Education has been provided regarding the importance of this vaccine but patient still declined. Advised may receive this vaccine at local pharmacy or Health Dept. Aware to provide a copy of the vaccination record if obtained from local pharmacy or Health Dept. Verbalized acceptance and understanding.  Pneumococcal vaccine status: Up to date  Covid-19 vaccine status: Declined, Education has been provided regarding the importance of this vaccine but patient still  declined. Advised may receive this vaccine at local pharmacy or Health Dept.or vaccine clinic. Aware to provide a copy of the vaccination record if obtained from local pharmacy or Health Dept. Verbalized acceptance and understanding.  Qualifies for Shingles Vaccine? Yes   Zostavax completed No   Shingrix Completed?: No.    Education has been provided regarding the importance of this vaccine. Patient has been advised to call insurance company to determine out of pocket expense if they have not yet received this vaccine. Advised may also receive vaccine at local pharmacy or Health Dept. Verbalized acceptance and understanding.  Screening Tests Health Maintenance  Topic Date Due   COVID-19 Vaccine (1 - 2024-25 season) Never done   Zoster Vaccines- Shingrix (1 of 2) 09/10/2023 (Originally 10/30/1976)   INFLUENZA VACCINE  09/21/2023 (Originally 01/22/2023)   Medicare Annual Wellness (AWV)  07/28/2024   DTaP/Tdap/Td (4 - Td or  Tdap) 11/25/2031   Pneumonia Vaccine 71+ Years old  Completed   DEXA SCAN  Completed   HPV VACCINES  Aged Out    Health Maintenance  Health Maintenance Due  Topic Date Due   COVID-19 Vaccine (1 - 2024-25 season) Never done    Colorectal cancer screening: No longer required.   Mammogram status: No longer required due to age.  Lung Cancer Screening: (Low Dose CT Chest recommended if Age 88-80 years, 20 pack-year currently smoking OR have quit w/in 15years.) does not qualify.   Lung Cancer Screening Referral: no  Additional Screening:  Hepatitis C Screening: does not qualify; Completed no  Vision Screening: Recommended annual ophthalmology exams for early detection of glaucoma and other disorders of the eye. Is the patient up to date with their annual eye exam?  Yes  Who is the provider or what is the name of the office in which the patient attends annual eye exams? Dr Portia If pt is not established with a provider, would they like to be referred to a provider  to establish care? No .   Dental Screening: Recommended annual dental exams for proper oral hygiene  Diabetic Foot Exam: n/a  Community Resource Referral / Chronic Care Management: CRR required this visit?  No   CCM required this visit?  No     Plan:     I have personally reviewed and noted the following in the patient's chart:   Medical and social history Use of alcohol , tobacco or illicit drugs  Current medications and supplements including opioid prescriptions. Patient is not currently taking opioid prescriptions. Functional ability and status Nutritional status Physical activity Advanced directives List of other physicians Hospitalizations, surgeries, and ER visits in previous 12 months Vitals Screenings to include cognitive, depression, and falls Referrals and appointments  In addition, I have reviewed and discussed with patient certain preventive protocols, quality metrics, and best practice recommendations. A written personalized care plan for preventive services as well as general preventive health recommendations were provided to patient.    Erminio LITTIE Saris, LPN   12/24/7972   After Visit Summary: (MyChart) Due to this being a telephonic visit, the after visit summary with patients personalized plan was offered to patient via MyChart   Nurse Notes: The patient states she is doing well and has no concerns or questions at this time.

## 2023-07-29 NOTE — Patient Instructions (Signed)
 Paula Jimenez , Thank you for taking time to come for your Medicare Wellness Visit. I appreciate your ongoing commitment to your health goals. Please review the following plan we discussed and let me know if I can assist you in the future.   Referrals/Orders/Follow-Ups/Clinician Recommendations: none  This is a list of the screening recommended for you and due dates:  Health Maintenance  Topic Date Due   COVID-19 Vaccine (1 - 2024-25 season) Never done   Zoster (Shingles) Vaccine (1 of 2) 09/10/2023*   Flu Shot  09/21/2023*   Medicare Annual Wellness Visit  07/28/2024   DTaP/Tdap/Td vaccine (4 - Td or Tdap) 11/25/2031   Pneumonia Vaccine  Completed   DEXA scan (bone density measurement)  Completed   HPV Vaccine  Aged Out  *Topic was postponed. The date shown is not the original due date.    Advanced directives: (In Chart) A copy of your advanced directives are scanned into your chart should your provider ever need it.  Next Medicare Annual Wellness Visit scheduled for next year: Yes 07/29/2024 @ 10:10am televisit

## 2023-08-07 ENCOUNTER — Other Ambulatory Visit: Payer: Self-pay | Admitting: Family Medicine

## 2023-08-19 ENCOUNTER — Ambulatory Visit: Payer: Self-pay | Admitting: Family Medicine

## 2023-08-19 NOTE — Telephone Encounter (Signed)
 Appt scheduled with Dr. Ermalene Searing. Routed message to her as well as PCP as a [place]

## 2023-08-19 NOTE — Telephone Encounter (Signed)
 Chief Complaint: Cough Symptoms: runny nose, congestion Frequency: Ongoing since last OV Pertinent Negatives: Patient denies fever, SOB Disposition: [] ED /[] Urgent Care (no appt availability in office) / [x] Appointment(In office/virtual)/ []  [facility]/ [] Home Care/ [] Refused Recommended Disposition /[] [facility]/ []  Follow-up with PCP Additional Notes: Last Thursday, she had a BLT from a restaurant and believes the bacon was bad because diarrhea began the next day. Pt notes she has a small amount of blood when wiping, hx of hemorrhoids. Pt's Dtr reports pt has had a hacking cough since last OV. Notes runny nose, congestion, denies sputum production, fever, SOB. She reports pt sounds like she's "choking" during coughing episodes and it will often limit her from being able to finish conversations. She notes that pt seems more irritable recently, getting words mixed up I.e. calling a cupcake a cookie or lettuce as slaw. She reports this is all new since her phone AWV. Appt scheduled tomorrow AM. This RN educated pt on home care, new-worsening symptoms, when to call back/seek emergent care. Pt verbalized understanding and agrees to plan.    Copied from CRM [id number]. Topic: Clinical - Red Word Triage >> Aug 19, 2023  9:32 AM Orinda Kenner C wrote: Red Word that prompted transfer to Nurse Triage: Patient has an appointment for 08/25/23 at 9 am and placed in a wait list to be seen sooner. Patient's child Lucendia Herrlich [phone] states symptoms not feeling well, head does not feel right like light headed, coughing- strangling a lot, possible sinus infection, speaking incorrectly, eating habits changes, upset stomach on and off- diarrhea, hard to get the information because of patient's age. Patient is not having a fever, pain, shortness of breath. Patient is on a seizure medication. Please advise. Reason for Disposition  [1] Continuous (nonstop) coughing interferes with work or school AND [2] no  improvement using cough treatment per Care Advice  Answer Assessment - Initial Assessment Questions 1. ONSET: "When did the cough begin?"      Ongoing since last OV 2. SEVERITY: "How bad is the cough today?"      Pt has difficulty talking through coughing spells 3. SPUTUM: "Describe the color of your sputum" (none, dry cough; clear, white, yellow, green)     None 4. HEMOPTYSIS: "Are you coughing up any blood?" If so ask: "How much?" (flecks, streaks, tablespoons, etc.)     None 5. DIFFICULTY BREATHING: "Are you having difficulty breathing?" If Yes, ask: "How bad is it?" (e.g., mild, moderate, severe)    - MILD: No SOB at rest, mild SOB with walking, speaks normally in sentences, can lie down, no retractions, pulse < 100.    - MODERATE: SOB at rest, SOB with minimal exertion and prefers to sit, cannot lie down flat, speaks in phrases, mild retractions, audible wheezing, pulse 100-120.    - SEVERE: Very SOB at rest, speaks in single words, struggling to breathe, sitting hunched forward, retractions, pulse > 120      None, sounds like she's "choking' when coughing 6. FEVER: "Do you have a fever?" If Yes, ask: "What is your temperature, how was it measured, and when did it start?"     None 10. OTHER SYMPTOMS: "Do you have any other symptoms?" (e.g., runny nose, wheezing, chest pain)       Runny nose, congestion  Protocols used: Cough - Acute Non-Productive-A-AH

## 2023-08-19 NOTE — Telephone Encounter (Signed)
 Noted.

## 2023-08-20 ENCOUNTER — Ambulatory Visit: Payer: Medicare HMO | Admitting: Family Medicine

## 2023-08-20 ENCOUNTER — Encounter: Payer: Self-pay | Admitting: Family Medicine

## 2023-08-20 VITALS — BP 130/72 | HR 77 | Temp 98.7°F | Ht 63.0 in | Wt 114.2 lb

## 2023-08-20 DIAGNOSIS — R42 Dizziness and giddiness: Secondary | ICD-10-CM

## 2023-08-20 LAB — COMPREHENSIVE METABOLIC PANEL
ALT: 19 U/L (ref 0–35)
AST: 19 U/L (ref 0–37)
Albumin: 3.9 g/dL (ref 3.5–5.2)
Alkaline Phosphatase: 67 U/L (ref 39–117)
BUN: 24 mg/dL — ABNORMAL HIGH (ref 6–23)
CO2: 35 meq/L — ABNORMAL HIGH (ref 19–32)
Calcium: 9.1 mg/dL (ref 8.4–10.5)
Chloride: 97 meq/L (ref 96–112)
Creatinine, Ser: 0.59 mg/dL (ref 0.40–1.20)
GFR: 76.94 mL/min (ref >60.00)
Glucose, Bld: 121 mg/dL — ABNORMAL HIGH (ref 70–99)
Potassium: 4.3 meq/L (ref 3.5–5.1)
Sodium: 138 meq/L (ref 135–145)
Total Bilirubin: 0.4 mg/dL (ref 0.2–1.2)
Total Protein: 6.4 g/dL (ref 6.0–8.3)

## 2023-08-20 LAB — CBC WITH DIFFERENTIAL/PLATELET
Basophils Absolute: 0 10*3/uL (ref 0.0–0.1)
Basophils Relative: 0.4 % (ref 0.0–3.0)
Eosinophils Absolute: 0 10*3/uL (ref 0.0–0.7)
Eosinophils Relative: 0.6 % (ref 0.0–5.0)
HCT: 41.3 % (ref 36.0–46.0)
Hemoglobin: 13.7 g/dL (ref 12.0–15.0)
Lymphocytes Relative: 23 % (ref 12.0–46.0)
Lymphs Abs: 1.7 10*3/uL (ref 0.7–4.0)
MCHC: 33.2 g/dL (ref 30.0–36.0)
MCV: 92.8 fl (ref 78.0–100.0)
Monocytes Absolute: 0.7 10*3/uL (ref 0.1–1.0)
Monocytes Relative: 9.2 % (ref 3.0–12.0)
Neutro Abs: 4.9 10*3/uL (ref 1.4–7.7)
Neutrophils Relative %: 66.8 % (ref 43.0–77.0)
Platelets: 185 10*3/uL (ref 150.0–400.0)
RBC: 4.45 Mil/uL (ref 3.87–5.11)
RDW: 14.1 % (ref 11.5–15.5)
WBC: 7.4 10*3/uL (ref 4.0–10.5)

## 2023-08-20 LAB — VITAMIN B12: Vitamin B-12: 366 pg/mL (ref 211–911)

## 2023-08-20 LAB — TSH: TSH: 1.23 u[IU]/mL (ref 0.35–5.50)

## 2023-08-20 NOTE — Patient Instructions (Addendum)
 Restart  Allegra for allergies.  Start nasonex or flonase .[name] Try 1 spray per nostril daily to avoid nasal  irritation.   Increase water and po intake. [name]   Please stop at the lab to have labs drawn.

## 2023-08-20 NOTE — Progress Notes (Signed)
 Patient ID: Paula Jimenez, female    DOB: December 18, 1926, 89 y.o.   MRN: [id number]  This visit was conducted in person.  BP 130/72 (BP Location: Left Arm, Patient Position: Sitting, Cuff Size: Normal)   Pulse 77   Temp 98.7 F (37.1 C) (Temporal)   Ht 5\' 3"  (1.6 m)   Wt 114 lb 4 oz (51.8 kg)   SpO2 98%   BMI 20.24 kg/m    CC:  Chief Complaint  Patient presents with   Dizziness    Subjective:   HPI: Paula Jimenez is a 89 y.o. female patient of Dr. Royden Purl with complicated past medical history including meningioma, chronic asthma, hypertension, Stroke, mitral valve prolapse, seizure and hearing loss  presenting on 08/20/2023 for Dizziness    She reports new onset swimmy-headed feeling in last 2 days. Noted mainly with with walking.  Feeling lightheaded, no room spinning.  No CP, NO SOB, no ear pain.  Some occ cough  Some sneeze and runny nose.  Occ headache off and on.  No new weakness, no new numbness.  More confusion lately in last month.  Last night abd pain but was relived with BM.   Less appetite, eating less, does drink maybe 24 oz  No urination issues, one episode of diarrhea after Jersy Mikes. Yesterday noted some blood on toilet paper... has history of tear at recturm in past.. likely the issue.   No further diarrhea   Not using flonase given caused nose bleeding   She is here with her daughter.  When neurologist refilled  levetiracetam ... Changed from pink to blue.. likely a different manufacturer in the last few weeks.  Lowered the dose in 10/2022  Has appt with neuro planned  BP Readings from Last 3 Encounters:  08/20/23 130/72  06/12/23 118/62  11/19/22 (!) 140/72   Wt Readings from Last 3 Encounters:  08/20/23 114 lb 4 oz (51.8 kg)  07/29/23 111 lb (50.3 kg)  06/12/23 111 lb (50.3 kg)        Relevant past medical, surgical, family and social history reviewed and updated as indicated. Interim medical history since our last visit  reviewed. Allergies and medications reviewed and updated. Outpatient Medications Prior to Visit  Medication Sig Dispense Refill   albuterol (VENTOLIN HFA) 108 (90 Base) MCG/ACT inhaler Inhale 2 puffs into the lungs every 6 (six) hours as needed for wheezing or shortness of breath. 8 g 1   AYR SALINE NASAL NA Place 1 spray into the nose daily as needed (allergies).      fluticasone (FLONASE) 50 MCG/ACT nasal spray PLACE 2 SPRAYS INTO BOTH NOSTRILS DAILY AS NEEDED FOR ALLERGIES OR RHINITIS. 48 mL 0   levETIRAcetam (KEPPRA) 250 MG tablet Take 250 mg by mouth every evening.     Probiotic Product (ALIGN PO) Take 1 tablet by mouth daily.     simvastatin (ZOCOR) 20 MG tablet TAKE 1 TABLET BY MOUTH EVERY DAY 90 tablet 1   triamcinolone lotion (KENALOG) 0.1 % Apply 1 Application topically at bedtime.     atenolol (TENORMIN) 50 MG tablet TAKE 1 TABLET BY MOUTH EVERY DAY 90 tablet 0   estradiol (ESTRACE) 0.1 MG/GM vaginal cream Place 1 Applicatorful vaginally 3 (three) times a week. 42.5 g 1   levETIRAcetam (KEPPRA) 250 MG tablet TAKE 1/2 TABLET IN THE MORNING AND 1 TABLET IN THE EVENING 135 tablet 1   No facility-administered medications prior to visit.     Per HPI unless  specifically indicated in ROS section below Review of Systems  Constitutional:  Negative for fatigue and fever.  HENT:  Negative for congestion.   Eyes:  Negative for pain.  Respiratory:  Negative for cough and shortness of breath.   Cardiovascular:  Negative for chest pain, palpitations and leg swelling.  Gastrointestinal:  Negative for abdominal pain.  Genitourinary:  Negative for dysuria and vaginal bleeding.  Musculoskeletal:  Negative for back pain.  Neurological:  Positive for dizziness and light-headedness. Negative for syncope and headaches.  Psychiatric/Behavioral:  Negative for dysphoric mood.    Objective:  BP 130/72 (BP Location: Left Arm, Patient Position: Sitting, Cuff Size: Normal)   Pulse 77   Temp 98.7 F  (37.1 C) (Temporal)   Ht 5\' 3"  (1.6 m)   Wt 114 lb 4 oz (51.8 kg)   SpO2 98%   BMI 20.24 kg/m   Wt Readings from Last 3 Encounters:  08/20/23 114 lb 4 oz (51.8 kg)  07/29/23 111 lb (50.3 kg)  06/12/23 111 lb (50.3 kg)      Physical Exam Constitutional:      General: She is not in acute distress.    Appearance: Normal appearance. She is well-developed. She is not ill-appearing or toxic-appearing.  HENT:     Head: Normocephalic.     Right Ear: Hearing, tympanic membrane, ear canal and external ear normal. Tympanic membrane is not erythematous, retracted or bulging.     Left Ear: Hearing, tympanic membrane, ear canal and external ear normal. Tympanic membrane is not erythematous, retracted or bulging.     Nose: No mucosal edema or rhinorrhea.     Right Sinus: No maxillary sinus tenderness or frontal sinus tenderness.     Left Sinus: No maxillary sinus tenderness or frontal sinus tenderness.     Mouth/Throat:     Pharynx: Uvula midline.  Eyes:     General: Lids are normal. Lids are everted, no foreign bodies appreciated.     Conjunctiva/sclera: Conjunctivae normal.     Pupils: Pupils are equal, round, and reactive to light.  Neck:     Thyroid: No thyroid mass or thyromegaly.     Vascular: No carotid bruit.     Trachea: Trachea normal.  Cardiovascular:     Rate and Rhythm: Normal rate and regular rhythm.     Pulses: Normal pulses.     Heart sounds: Normal heart sounds, S1 normal and S2 normal. No murmur heard.    No friction rub. No gallop.  Pulmonary:     Effort: Pulmonary effort is normal. No tachypnea or respiratory distress.     Breath sounds: Normal breath sounds. No decreased breath sounds, wheezing, rhonchi or rales.  Abdominal:     General: Bowel sounds are normal.     Palpations: Abdomen is soft.     Tenderness: There is no abdominal tenderness.  Musculoskeletal:     Cervical back: Normal range of motion and neck supple.  Skin:    General: Skin is warm and dry.      Findings: No rash.  Neurological:     Mental Status: She is alert and oriented to person, place, and time.     GCS: GCS eye subscore is 4. GCS verbal subscore is 5. GCS motor subscore is 6.     Cranial Nerves: No cranial nerve deficit.     Sensory: No sensory deficit.     Motor: No abnormal muscle tone.     Coordination: Coordination normal.     Gait:  Gait normal.     Deep Tendon Reflexes: Reflexes are normal and symmetric.     Comments: Nml cerebellar exam   No papilledema  Psychiatric:        Mood and Affect: Mood is not anxious or depressed.        Speech: Speech normal.        Behavior: Behavior normal. Behavior is cooperative.        Thought Content: Thought content normal.        Cognition and Memory: Memory is not impaired. She does not exhibit impaired recent memory or impaired remote memory.        Judgment: Judgment normal.       Results for orders placed or performed in visit on 08/20/23  CBC with Differential/Platelet   Collection Time: 08/20/23 12:43 PM  Result Value Ref Range   WBC 7.4 4.0 - 10.5 K/uL   RBC 4.45 3.87 - 5.11 Mil/uL   Hemoglobin 13.7 12.0 - 15.0 g/dL   HCT 72.5 36.6 - 44.0 %   MCV 92.8 78.0 - 100.0 fl   MCHC 33.2 30.0 - 36.0 g/dL   RDW 34.7 42.5 - 95.6 %   Platelets 185.0 150.0 - 400.0 K/uL   Neutrophils Relative % 66.8 43.0 - 77.0 %   Lymphocytes Relative 23.0 12.0 - 46.0 %   Monocytes Relative 9.2 3.0 - 12.0 %   Eosinophils Relative 0.6 0.0 - 5.0 %   Basophils Relative 0.4 0.0 - 3.0 %   Neutro Abs 4.9 1.4 - 7.7 K/uL   Lymphs Abs 1.7 0.7 - 4.0 K/uL   Monocytes Absolute 0.7 0.1 - 1.0 K/uL   Eosinophils Absolute 0.0 0.0 - 0.7 K/uL   Basophils Absolute 0.0 0.0 - 0.1 K/uL  Comprehensive metabolic panel   Collection Time: 08/20/23 12:43 PM  Result Value Ref Range   Sodium 138 135 - 145 mEq/L   Potassium 4.3 3.5 - 5.1 mEq/L   Chloride 97 96 - 112 mEq/L   CO2 35 (H) 19 - 32 mEq/L   Glucose, Bld 121 (H) 70 - 99 mg/dL   BUN 24 (H) 6 - 23  mg/dL   Creatinine, Ser 3.87 0.40 - 1.20 mg/dL   Total Bilirubin 0.4 0.2 - 1.2 mg/dL   Alkaline Phosphatase 67 39 - 117 U/L   AST 19 0 - 37 U/L   ALT 19 0 - 35 U/L   Total Protein 6.4 6.0 - 8.3 g/dL   Albumin 3.9 3.5 - 5.2 g/dL   GFR 56.43 >32.95 mL/min   Calcium 9.1 8.4 - 10.5 mg/dL  TSH   Collection Time: 08/20/23 12:43 PM  Result Value Ref Range   TSH 1.23 0.35 - 5.50 uIU/mL  Vitamin B12   Collection Time: 08/20/23 12:43 PM  Result Value Ref Range   Vitamin B-12 366 211 - 911 pg/mL    Assessment and Plan  Lightheadedness Assessment & Plan: Acute, likely multifactorial Will evaluate with labs to identify cause. She does have some symptoms of sneezing and runny nose/allergies.  Possible eustachian tube dysfunction contributing to lightheadedness. Restart  Allegra for allergies.  Start nasonex or flonase .[name] Try 1 spray per nostril daily to avoid nasal  irritation.   Increase water and po intake. . A lot of her symptoms are chronic including gradual confusion poor appetite without weight loss.  Recommend if initial evaluation with labs today unrevealing, recommend that she follows up with her PCP for further evaluation.  Daughter and patient feel it  could be symptoms secondary to change in manufacturer of the levetiracetam, she has upcoming appointment with neurology.  Return and ER precautions provided.   Orders: -     CBC with Differential/Platelet -     Comprehensive metabolic panel -     TSH -     Vitamin B12    Return if symptoms worsen or fail to improve.   Kerby Nora, MD

## 2023-08-21 ENCOUNTER — Other Ambulatory Visit: Payer: Self-pay | Admitting: Family Medicine

## 2023-08-21 NOTE — Telephone Encounter (Signed)
 Last filled on 04/21/22 # 42.5 g with 1 refill  Pt has had multiple acute appts but no recent f/u or CPE and no future appts., please advise

## 2023-08-25 ENCOUNTER — Ambulatory Visit: Payer: Medicare HMO | Admitting: Family Medicine

## 2023-08-30 ENCOUNTER — Other Ambulatory Visit: Payer: Self-pay | Admitting: Family Medicine

## 2023-08-31 NOTE — Telephone Encounter (Signed)
 Last f/u was 07/11/22 since then pt has had multiple recent sick/acute appts. No future appts., please advise

## 2023-08-31 NOTE — Telephone Encounter (Signed)
Refilled once  Please schedule appointment

## 2023-09-01 NOTE — Telephone Encounter (Signed)
 Spoke to pt'sa daughter, scheduled f/u for 09/08/23

## 2023-09-07 NOTE — Assessment & Plan Note (Signed)
 Acute, likely multifactorial Will evaluate with labs to identify cause. She does have some symptoms of sneezing and runny nose/allergies.  Possible eustachian tube dysfunction contributing to lightheadedness. Restart  Allegra for allergies.  Start nasonex or flonase .[name] Try 1 spray per nostril daily to avoid nasal  irritation.   Increase water and po intake. . A lot of her symptoms are chronic including gradual confusion poor appetite without weight loss.  Recommend if initial evaluation with labs today unrevealing, recommend that she follows up with her PCP for further evaluation.  Daughter and patient feel it could be symptoms secondary to change in manufacturer of the levetiracetam, she has upcoming appointment with neurology.  Return and ER precautions provided.

## 2023-09-08 ENCOUNTER — Ambulatory Visit (INDEPENDENT_AMBULATORY_CARE_PROVIDER_SITE_OTHER): Admitting: Family Medicine

## 2023-09-08 ENCOUNTER — Encounter: Payer: Self-pay | Admitting: Family Medicine

## 2023-09-08 VITALS — BP 130/65 | HR 78 | Temp 98.2°F | Ht 63.0 in | Wt 114.2 lb

## 2023-09-08 DIAGNOSIS — R7303 Prediabetes: Secondary | ICD-10-CM

## 2023-09-08 DIAGNOSIS — R519 Headache, unspecified: Secondary | ICD-10-CM

## 2023-09-08 DIAGNOSIS — R42 Dizziness and giddiness: Secondary | ICD-10-CM

## 2023-09-08 DIAGNOSIS — D329 Benign neoplasm of meninges, unspecified: Secondary | ICD-10-CM

## 2023-09-08 DIAGNOSIS — I10 Essential (primary) hypertension: Secondary | ICD-10-CM | POA: Diagnosis not present

## 2023-09-08 DIAGNOSIS — Z87898 Personal history of other specified conditions: Secondary | ICD-10-CM

## 2023-09-08 DIAGNOSIS — E7800 Pure hypercholesterolemia, unspecified: Secondary | ICD-10-CM | POA: Diagnosis not present

## 2023-09-08 MED ORDER — SIMVASTATIN 20 MG PO TABS: 20.0000 mg | ORAL_TABLET | Freq: Every day | ORAL | 3 refills | Status: AC

## 2023-09-08 MED ORDER — ATENOLOL 50 MG PO TABS: 50.0000 mg | ORAL_TABLET | Freq: Every day | ORAL | 3 refills | Status: AC

## 2023-09-08 NOTE — Patient Instructions (Addendum)
 Go back to using flonase every other day 1 spray in each nostril  If you get a significant nosebleed let us know   If dizziness or headache get worse at any time let us know   I will order a CT of the brain  I put the referral in  Please let us know if you don't hear in 1-2 weeks   Continue current medicines

## 2023-09-08 NOTE — Assessment & Plan Note (Signed)
 bp in fair control at this time  BP Readings from Last 1 Encounters:  09/08/23 130/65   No changes needed Most recent labs reviewed  Disc lifstyle change with low sodium diet and exercise  Continues atenolol 50 mg daily

## 2023-09-08 NOTE — Assessment & Plan Note (Signed)
 Lipid panel today   Disc goals for lipids and reasons to control them Rev last labs with pt Rev low sat fat diet in detail  Continues simvastatin 20 mg daily

## 2023-09-08 NOTE — Assessment & Plan Note (Signed)
 Continues keppra 250 mg daily with neuro care  No further seizures but is having some dizziness and headache  History of meningioma   CT head ordered  Pt has neuro follow up in summer

## 2023-09-08 NOTE — Assessment & Plan Note (Signed)
 Not improved from last visit  Positional /sometimes feels like spinning  Also headache and ear discomfort Was unable to tolerate daily flonase due to bleeding from nares  Reassuring exam Will try flonase every other day (one spray per nostril)   CT head ordered in light of this with headache and history of meningioma

## 2023-09-08 NOTE — Assessment & Plan Note (Signed)
 Lab Results  Component Value Date   HGBA1C 5.9 06/12/2023   HGBA1C 6.1 (H) 06/14/2022   HGBA1C 6.1 03/26/2022   disc imp of low glycemic diet and wt loss to prevent DM2

## 2023-09-08 NOTE — Assessment & Plan Note (Signed)
 New  Usually at temples  Intermittent  Also dizziness  History of meningioma  CT head ordered  Discussed possible sinus inv as well- will return to flonase but every other day to prevent epistaxis

## 2023-09-08 NOTE — Progress Notes (Signed)
 Subjective:    Patient ID: Paula Jimenez, female    DOB: [birth year], 89 y.o.   MRN: [id number]  HPI  Wt Readings from Last 3 Encounters:  09/08/23 114 lb 4 oz (51.8 kg)  08/20/23 114 lb 4 oz (51.8 kg)  07/29/23 111 lb (50.3 kg)   20.24 kg/m  Vitals:   09/08/23 1423 09/08/23 1455  BP: (!) 148/74 130/65  Pulse: 78   Temp: 98.2 F (36.8 C)   SpO2: 96%    Pt presents for follow up of HTN and hyperlipidemia and chronic medical problems    Had a visit last mo with Dr Ermalene Searing for light headedness Discussed possible ETD and recommended flonase ns   Still feels dizzy at times  Positional -has to change positions intentionally /slowly  Is spinning sensation  Left ear does not feel right  Hearing is not as good   Few episodes of ear pain -fleeting    Quit flonase due to nose bleed Uses allegra daily   Headaches  Occational throbs Temples  Sinuses  Bilateral   Not congested  Allegra helps a bit  Some sneezing    bp is stable today  No cp or palpitations or headaches or edema  No side effects to medicines  BP Readings from Last 3 Encounters:  09/08/23 130/65  08/20/23 130/72  06/12/23 118/62    Atenolol 50 mg daily   Pulse Readings from Last 3 Encounters:  09/08/23 78  08/20/23 77  06/12/23 75    Lab Results  Component Value Date   NA 138 08/20/2023   K 4.3 08/20/2023   CO2 35 (H) 08/20/2023   GLUCOSE 121 (H) 08/20/2023   BUN 24 (H) 08/20/2023   CREATININE 0.59 08/20/2023   CALCIUM 9.1 08/20/2023   GFR 76.94 08/20/2023   GFRNONAA >60 06/19/2022   Lab Results  Component Value Date   TSH 1.23 08/20/2023   Lab Results  Component Value Date   WBC 7.4 08/20/2023   HGB 13.7 08/20/2023   HCT 41.3 08/20/2023   MCV 92.8 08/20/2023   PLT 185.0 08/20/2023   Lab Results  Component Value Date   VITAMINB12 366 08/20/2023   No falls  No trauma    Hyperlipidemia Lab Results  Component Value Date   CHOL 162 06/12/2023   HDL 56.30 06/12/2023    LDLCALC 70 06/12/2023   LDLDIRECT 159.5 02/19/2009   TRIG 181.0 (H) 06/12/2023   CHOLHDL 3 06/12/2023   Simvastatin 20 mg daily      History of osteoporosis Declines dexa or treatment   Glucose  Lab Results  Component Value Date   HGBA1C 5.9 06/12/2023   HGBA1C 6.1 (H) 06/14/2022   HGBA1C 6.1 03/26/2022     Sees neuro for menigioma  Takes keppra   Patient Active Problem List   Diagnosis Date Noted   Headache 09/08/2023   Urinary frequency 10/13/2022   History of seizure 06/25/2022   Physical deconditioning 06/17/2022   History of stroke 06/14/2022   Mitral valve prolapse 06/14/2022   Transportation insecurity 05/26/2022   Underweight 05/26/2022   Meningioma (HCC) 04/21/2022   Generalized weakness 04/21/2022   Falls frequently 04/07/2022   Varicose vein of leg 10/18/2020   Onychomycosis of toenail 03/02/2019   Urethral caruncle 12/10/2018   Prediabetes 08/25/2018   Medicare annual wellness visit, subsequent 08/25/2018   Loose stools 06/28/2018   Internal hemorrhoid, bleeding 04/20/2018   Corn of toe 04/20/2018   Ingrown nail of great toe of  right foot 11/05/2017   Routine general medical examination at a health care facility 08/07/2016   Frequent UTI 02/16/2015   Epistaxis, recurrent 02/28/2014   History of shingles 02/20/2014   Dizziness 01/06/2014   Hearing loss 09/23/2007   History of melanoma 02/25/2007   HLD (hyperlipidemia) 02/25/2007   Allergic rhinitis 02/25/2007   Asthma, chronic 02/25/2007   DEGENERATIVE DISC DISEASE, LUMBOSACRAL SPINE 02/25/2007   Age related osteoporosis 02/25/2007   Essential hypertension 10/26/2006   Past Medical History:  Diagnosis Date   Allergy    Asthma    years ago, 2-3 times a year pt experiences a problem with breathing.   Bursitis, hip    Family history of adverse reaction to anesthesia    DAUGHTER had a reaction to ansethesia   Hyperlipidemia    Hypertension    HYPERTENSION, BENIGN 10/26/2006   Qualifier:  Diagnosis of  By: Mills Koller     MITRAL VALVE PROLAPSE 02/25/2007   Qualifier: History of  By: Lindwood Qua CMA, Jerl Santos     Osteopenia 08/01   Osteoporosis    Skin cancer 2015   nose   Past Surgical History:  Procedure Laterality Date   ABDOMINAL HYSTERECTOMY  1971   BREAST BIOPSY Right 06/99   fibrocystic   CYSTOSCOPY WITH BIOPSY N/A 05/31/2015   Procedure: CYSTOSCOPY WITH BIOPSY/ fulgeration;  Surgeon: Vanna Scotland, MD;  Location: [facility];  Service: Urology;  Laterality: N/A;   DENTAL SURGERY     EYE SURGERY Bilateral 2011   June and August of 2011   ORIF RADIAL FRACTURE Left 11/99   arm fracture, radial no surgery   TONSILLECTOMY     Social History   Tobacco Use   Smoking status: Never   Smokeless tobacco: Never  Vaping Use   Vaping status: Never Used  Substance Use Topics   Alcohol use: No    Alcohol/week: 0.0 standard drinks of alcohol   Drug use: No   Family History  Problem Relation Age of Onset   Leukemia Sister    Cancer Sister        leukemia   Heart disease Mother    Diabetes Mother    Heart failure Mother    Stroke Father    Kidney disease Neg Hx    Kidney cancer Neg Hx    Bladder Cancer Neg Hx    Allergies  Allergen Reactions   Clindamycin/Lincomycin Swelling    Throat swelling   Codeine Other (See Comments)    Unknown reaction    Keflex [Cephalexin] Other (See Comments)    Dizziness    Levofloxacin Other (See Comments)    Unknown reaction   Sulfa Antibiotics Other (See Comments)    Unknown reaction   Valtrex [Valacyclovir Hcl] Other (See Comments)    Unknown reaction    Vioxx [Rofecoxib] Other (See Comments)    Unknown reaction   Adhesive [Tape] Rash   Evista [Raloxifene] Rash   Macrobid [Nitrofurantoin] Nausea Only   Penicillins Swelling    Mouth swelling   Ultram [Tramadol] Other (See Comments)    Dizziness    Current Outpatient Medications on File Prior to Visit  Medication Sig Dispense Refill   albuterol (VENTOLIN HFA)  108 (90 Base) MCG/ACT inhaler Inhale 2 puffs into the lungs every 6 (six) hours as needed for wheezing or shortness of breath. 8 g 1   AYR SALINE NASAL NA Place 1 spray into the nose daily as needed (allergies).      estradiol (ESTRACE) 0.1  MG/GM vaginal cream PLACE 1 APPLICATORFUL VAGINALLY 3 (THREE) TIMES A WEEK. 42.5 g 1   fluticasone (FLONASE) 50 MCG/ACT nasal spray PLACE 2 SPRAYS INTO BOTH NOSTRILS DAILY AS NEEDED FOR ALLERGIES OR RHINITIS. 48 mL 0   levETIRAcetam (KEPPRA) 250 MG tablet Take 250 mg by mouth every evening.     Probiotic Product (ALIGN PO) Take 1 tablet by mouth daily.     triamcinolone lotion (KENALOG) 0.1 % Apply 1 Application topically at bedtime.     No current facility-administered medications on file prior to visit.    Review of Systems  Constitutional:  Positive for fatigue. Negative for activity change, appetite change, fever and unexpected weight change.  HENT:  Positive for postnasal drip and sneezing. Negative for congestion, ear pain, rhinorrhea, sinus pressure and sore throat.   Eyes:  Negative for pain, redness and visual disturbance.  Respiratory:  Negative for cough, shortness of breath and wheezing.   Cardiovascular:  Negative for chest pain and palpitations.  Gastrointestinal:  Negative for abdominal pain, blood in stool, constipation and diarrhea.  Endocrine: Negative for polydipsia and polyuria.  Genitourinary:  Negative for dysuria, frequency and urgency.  Musculoskeletal:  Negative for arthralgias, back pain and myalgias.  Skin:  Negative for pallor and rash.  Allergic/Immunologic: Negative for environmental allergies.  Neurological:  Positive for dizziness, light-headedness and headaches. Negative for tremors, seizures, syncope, facial asymmetry, speech difficulty, weakness and numbness.  Hematological:  Negative for adenopathy. Does not bruise/bleed easily.  Psychiatric/Behavioral:  Negative for decreased concentration and dysphoric mood. The  patient is not nervous/anxious.        Objective:   Physical Exam Constitutional:      General: She is not in acute distress.    Appearance: Normal appearance. She is well-developed and normal weight. She is not ill-appearing or diaphoretic.  HENT:     Head: Normocephalic and atraumatic.     Right Ear: External ear normal.     Left Ear: External ear normal.     Nose: Rhinorrhea present.     Comments: Boggy nares    Mouth/Throat:     Pharynx: No oropharyngeal exudate.  Eyes:     General: No scleral icterus.       Right eye: No discharge.        Left eye: No discharge.     Conjunctiva/sclera: Conjunctivae normal.     Pupils: Pupils are equal, round, and reactive to light.     Comments: No nystagmus  Neck:     Thyroid: No thyromegaly.     Vascular: No carotid bruit or JVD.     Trachea: No tracheal deviation.  Cardiovascular:     Rate and Rhythm: Normal rate and regular rhythm.     Heart sounds: Normal heart sounds. No murmur heard. Pulmonary:     Effort: Pulmonary effort is normal. No respiratory distress.     Breath sounds: Normal breath sounds. No wheezing or rales.  Abdominal:     General: Bowel sounds are normal. There is no distension.     Palpations: Abdomen is soft. There is no mass.     Tenderness: There is no abdominal tenderness.  Musculoskeletal:        General: No tenderness.     Cervical back: Full passive range of motion without pain, normal range of motion and neck supple.  Lymphadenopathy:     Cervical: No cervical adenopathy.  Skin:    General: Skin is warm and dry.     Coloration: Skin  is not pale.     Findings: No rash.  Neurological:     Mental Status: She is alert and oriented to person, place, and time.     Cranial Nerves: No cranial nerve deficit, dysarthria or facial asymmetry.     Sensory: No sensory deficit.     Motor: No weakness, tremor, atrophy, abnormal muscle tone, seizure activity or pronator drift.     Coordination: Romberg sign  negative. Coordination normal. Finger-Nose-Finger Test normal.     Gait: Gait normal.     Deep Tendon Reflexes: Reflexes are normal and symmetric. Reflexes normal.     Comments: No focal cerebellar signs   Psychiatric:        Behavior: Behavior normal.        Thought Content: Thought content normal.           Assessment & Plan:   Problem List Items Addressed This Visit       Cardiovascular and Mediastinum   Essential hypertension - Primary   bp in fair control at this time  BP Readings from Last 1 Encounters:  09/08/23 130/65   No changes needed Most recent labs reviewed  Disc lifstyle change with low sodium diet and exercise  Continues atenolol 50 mg daily        Relevant Medications   atenolol (TENORMIN) 50 MG tablet   simvastatin (ZOCOR) 20 MG tablet     Nervous and Auditory   Meningioma (HCC)   With history of one seizure -on keppra   Next neuro visit is in July   Now -recent increase in dizziness and also headache CT head w/o contrast ordered   Call back and Er precautions noted in detail today        Relevant Orders   CT HEAD WO CONTRAST ( )     Other   Prediabetes   Lab Results  Component Value Date   HGBA1C 5.9 06/12/2023   HGBA1C 6.1 (H) 06/14/2022   HGBA1C 6.1 03/26/2022   disc imp of low glycemic diet and wt loss to prevent DM2       HLD (hyperlipidemia)   Lipid panel today   Disc goals for lipids and reasons to control them Rev last labs with pt Rev low sat fat diet in detail  Continues simvastatin 20 mg daily        Relevant Medications   atenolol (TENORMIN) 50 MG tablet   simvastatin (ZOCOR) 20 MG tablet   History of seizure   Continues keppra 250 mg daily with neuro care  No further seizures but is having some dizziness and headache  History of meningioma   CT head ordered  Pt has neuro follow up in summer       Headache   New  Usually at temples  Intermittent  Also dizziness  History of meningioma  CT head  ordered  Discussed possible sinus inv as well- will return to flonase but every other day to prevent epistaxis       Relevant Medications   atenolol (TENORMIN) 50 MG tablet   Other Relevant Orders   CT HEAD WO CONTRAST ( )   Dizziness   Not improved from last visit  Positional /sometimes feels like spinning  Also headache and ear discomfort Was unable to tolerate daily flonase due to bleeding from nares  Reassuring exam Will try flonase every other day (one spray per nostril)   CT head ordered in light of this with headache and history of meningioma  Relevant Orders   CT HEAD WO CONTRAST ( )

## 2023-09-08 NOTE — Assessment & Plan Note (Signed)
 With history of one seizure -on keppra   Next neuro visit is in July   Now -recent increase in dizziness and also headache CT head w/o contrast ordered   Call back and Er precautions noted in detail today

## 2023-09-09 ENCOUNTER — Encounter: Payer: Self-pay | Admitting: Family Medicine

## 2023-09-09 ENCOUNTER — Telehealth: Payer: Self-pay | Admitting: Family Medicine

## 2023-09-09 NOTE — Telephone Encounter (Signed)
 Copied from CRM [id number]. Topic: General - Other >> Sep 09, 2023 11:18 AM Pascal Lux wrote: Reason for CRM: Paula Jimenez - Stated patient is scheduled to have a CT scan Monday and calling to make sure her prior authorization has been completed. Stated must be sent by 09/11/23 at 10AM or will cancel appointment. Phone: [phone] Fax:[phone]

## 2023-09-09 NOTE — Telephone Encounter (Signed)
 Authorization #[id number]  Tracking #[id number] Dates of service: 09/08/2023 - 11/07/2023 Performing facility or agency: [facility] - [id number]  Notated on the appt desk

## 2023-09-14 ENCOUNTER — Ambulatory Visit
Admission: RE | Admit: 2023-09-14 | Discharge: 2023-09-14 | Disposition: A | Discharge status: Home / Self Care | Source: Ambulatory Visit | Attending: Family Medicine | Admitting: Family Medicine

## 2023-09-14 DIAGNOSIS — R42 Dizziness and giddiness: Secondary | ICD-10-CM | POA: Diagnosis not present

## 2023-09-14 DIAGNOSIS — R519 Headache, unspecified: Secondary | ICD-10-CM | POA: Diagnosis not present

## 2023-09-14 DIAGNOSIS — D329 Benign neoplasm of meninges, unspecified: Secondary | ICD-10-CM

## 2023-09-24 ENCOUNTER — Encounter: Payer: Self-pay | Admitting: Family Medicine

## 2023-09-24 NOTE — Progress Notes (Signed)
 That is reassuring  Keep me posted

## 2023-10-20 ENCOUNTER — Other Ambulatory Visit: Payer: Self-pay | Admitting: Neurology

## 2023-11-03 ENCOUNTER — Telehealth: Payer: Self-pay | Admitting: *Deleted

## 2023-11-03 NOTE — Telephone Encounter (Signed)
 Amy ask that I send message to Ashtyn to look at it and if it looks okay from referral end then Amy will try and see what we can do to help

## 2023-11-03 NOTE — Telephone Encounter (Signed)
 This CT was approved on 09/09/23, patient had this scan completed on 09/15/23  Authorization #[id number]  Tracking #[id number] Dates of service: 09/08/2023 - 11/07/2023 Performing facility or agency: [facility] / [facility] - [id number]  I am not fully understanding the issue.  This was handled appropriately prior to the patient having this scan.

## 2023-11-03 NOTE — Telephone Encounter (Signed)
 Copied from CRM [id number]. Topic: General - Other >> Nov 03, 2023  9:21 AM Annelle Kiel wrote: Reason for CRM: patient ct scan was denied by her insurance company due to dr tower did not do a pa for the ct scan patient daughter would like a call back

## 2023-11-03 NOTE — Telephone Encounter (Signed)
 Spoke to patient's daughter Lona Rist @ [phone]  Mrs. Toy Freund received a denial letter for no authorization from [facility] Claim# [id number] For DOS: 3.24.25 Claim amount: $[id number]  Called [facility] [phone] billing. Carolynne Citron said that she sees the same authorization number on their end, and that they will appeal the denial with [employer].  Nothing for the patient to do at this time.  Documentation will be sent to the patient from the insurance company during this process.  Called Faye back, gave her the information noted above including the billing number for [facility]

## 2023-11-03 NOTE — Telephone Encounter (Signed)
 Per Ashtyn's note on 09/09/23   09/09/23  1:00 PM  Authorization #[id number]  Tracking #[id number] Dates of service: 09/08/2023 - 11/07/2023 Performing facility or agency: [facility] - [id number]   Notated on the appt desk

## 2023-11-03 NOTE — Telephone Encounter (Signed)
 Will route to Amy to f/u on

## 2023-12-24 DIAGNOSIS — H6993 Unspecified Eustachian tube disorder, bilateral: Secondary | ICD-10-CM | POA: Diagnosis not present

## 2023-12-24 DIAGNOSIS — H65113 Acute and subacute allergic otitis media (mucoid) (sanguinous) (serous), bilateral: Secondary | ICD-10-CM | POA: Diagnosis not present

## 2023-12-28 ENCOUNTER — Ambulatory Visit: Admitting: Neurology

## 2023-12-28 ENCOUNTER — Encounter: Payer: Self-pay | Admitting: Neurology

## 2023-12-28 VITALS — BP 159/78 | HR 72 | Resp 16 | Ht 64.0 in

## 2023-12-28 DIAGNOSIS — D329 Benign neoplasm of meninges, unspecified: Secondary | ICD-10-CM | POA: Diagnosis not present

## 2023-12-28 DIAGNOSIS — G40909 Epilepsy, unspecified, not intractable, without status epilepticus: Secondary | ICD-10-CM | POA: Diagnosis not present

## 2023-12-28 MED ORDER — LEVETIRACETAM 250 MG PO TABS: 250.0000 mg | ORAL_TABLET | Freq: Every evening | ORAL | 3 refills | Status: AC

## 2023-12-28 NOTE — Progress Notes (Signed)
 [facility]  PATIENT: Paula Jimenez DOB: 06/11/1927  REQUESTING CLINICIAN: Tower, Laine LABOR, MD HISTORY FROM: Patient and her daughter  REASON FOR VISIT: New onset seizure    HISTORICAL  CHIEF COMPLAINT:  Chief Complaint  Patient presents with   Seizures    Rm12, daughter present, Sz:pt hasn't had recent sz and is well controlled, no concerns   INTERVAL HISTORY 12/28/2023:  Patient presents today for follow-up, last visit was in February, since then, she has been doing well, no seizure, no seizure-like activity.  At that time she was complaining of increased somnolence with the levetiracetam  therefore we decrease it to 250 mg nightly.  She tells me after taking the medication she goes right to sleep around 8 PM until 7 AM. She is also complaining of ongoing dizziness, swimmy headed, feel like she has a year infection.  Currently on azithromycin , she has 2 more doses left.    HISTORY OF PRESENT ILLNESS:  This is a 89 year old woman with past medical history hypertension, hyperlipidemia, osteoporosis who is presenting after being admitted to hospital for new onset seizure in December 23. Per daughter patient was in the bathroom when she heard a thud, she found the patient very stiff arms contracted and leg extended, eyes rolled back foaming at the mouth.  There was also blood around her mouth. EMS was called and patient was taken to the hospital.  In the hospital she did have a EEG that showed evidence of nonconvulsive status epilepticus.  She was started on Keppra .  Initially she had side effect of the Keppra  including somnolence and fatigue but since Keppra  has been decreased to 250 twice daily she has been doing better, still experiences fatigue but less than before.  She has not reported any seizures since discharge from the hospital.  On further review, patient reported while in high school she was having episode of staring spells.  At that time, they did not know  what it was and she was not treated.  Overall she is stable.  She lives with her daughter.     Handedness: Right handed   Onset: June 14 2022  Seizure Type: History of staring spell while in high school  Current frequency: Status in December 23  Any injuries from seizures: Tongue biting  Seizure risk factors: Concussion,   Previous ASMs: None   Currenty ASMs: Levetiracetam  250 mg nightly    ASMs side effects: Fatigue   Brain Images: no acute finding   Previous EEGs: Status epilepticus    OTHER MEDICAL CONDITIONS: Hypertension, Hyperlipidemia, Osteoporosis   REVIEW OF SYSTEMS: Full 14 system review of systems performed and negative with exception of: As noted in the HPI   ALLERGIES: Allergies  Allergen Reactions   Doxycycline  Hyclate Nausea And Vomiting   Clindamycin /Lincomycin Swelling    Throat swelling   Codeine Other (See Comments)    Unknown reaction    Keflex  [Cephalexin ] Other (See Comments)    Dizziness    Levofloxacin  Other (See Comments)    Unknown reaction   Sulfa  Antibiotics Other (See Comments)    Unknown reaction   Valtrex  [Valacyclovir  Hcl] Other (See Comments)    Unknown reaction    Vioxx [Rofecoxib] Other (See Comments)    Unknown reaction   Adhesive [Tape] Rash   Evista [Raloxifene] Rash   Macrobid  [Nitrofurantoin ] Nausea Only   Penicillins Swelling    Mouth swelling   Ultram  [Tramadol ] Other (See Comments)    Dizziness     HOME  MEDICATIONS: Outpatient Medications Prior to Visit  Medication Sig Dispense Refill   atenolol  (TENORMIN ) 50 MG tablet Take 1 tablet (50 mg total) by mouth daily. 90 tablet 3   AYR SALINE NASAL NA Place 1 spray into the nose daily as needed (allergies).      azithromycin  (ZITHROMAX ) 250 MG tablet Take 250 mg by mouth daily.     estradiol  (ESTRACE ) 0.1 MG/GM vaginal cream PLACE 1 APPLICATORFUL VAGINALLY 3 (THREE) TIMES A WEEK. 42.5 g 1   fluticasone  (FLONASE ) 50 MCG/ACT nasal spray PLACE 2 SPRAYS INTO BOTH  NOSTRILS DAILY AS NEEDED FOR ALLERGIES OR RHINITIS. 48 mL 0   Probiotic Product (ALIGN PO) Take 1 tablet by mouth daily.     simvastatin  (ZOCOR ) 20 MG tablet Take 1 tablet (20 mg total) by mouth daily. 90 tablet 3   triamcinolone lotion (KENALOG) 0.1 % Apply 1 Application topically at bedtime.     levETIRAcetam  (KEPPRA ) 250 MG tablet Take 1 tablet (250 mg total) by mouth at bedtime. 30 tablet 2   albuterol  (VENTOLIN  HFA) 108 (90 Base) MCG/ACT inhaler Inhale 2 puffs into the lungs every 6 (six) hours as needed for wheezing or shortness of breath. 8 g 1   No facility-administered medications prior to visit.    PAST MEDICAL HISTORY: Past Medical History:  Diagnosis Date   Allergy    Asthma    years ago, 2-3 times a year pt experiences a problem with breathing.   Bursitis, hip    Family history of adverse reaction to anesthesia    DAUGHTER had a reaction to ansethesia   Hyperlipidemia    Hypertension    HYPERTENSION, BENIGN 10/26/2006   Qualifier: Diagnosis of  By: Hope Chessman     MITRAL VALVE PROLAPSE 02/25/2007   Qualifier: History of  By: Cecilie CMA, NANNIE Ivy     Osteopenia 08/01   Osteoporosis    Skin cancer 2015   nose    PAST SURGICAL HISTORY: Past Surgical History:  Procedure Laterality Date   ABDOMINAL HYSTERECTOMY  1971   BREAST BIOPSY Right 06/99   fibrocystic   CYSTOSCOPY WITH BIOPSY N/A 05/31/2015   Procedure: CYSTOSCOPY WITH BIOPSY/ fulgeration;  Surgeon: Rosina Riis, MD;  Location: [facility] ORS;  Service: Urology;  Laterality: N/A;   DENTAL SURGERY     EYE SURGERY Bilateral 2011   June and August of 2011   ORIF RADIAL FRACTURE Left 11/99   arm fracture, radial no surgery   TONSILLECTOMY      FAMILY HISTORY: Family History  Problem Relation Age of Onset   Leukemia Sister    Cancer Sister        leukemia   Heart disease Mother    Diabetes Mother    Heart failure Mother    Stroke Father    Kidney disease Neg Hx    Kidney cancer Neg Hx    Bladder  Cancer Neg Hx     SOCIAL HISTORY: Social History   Socioeconomic History   Marital status: Widowed    Spouse name: Not on file   Number of children: 4   Years of education: Not on file   Highest education level: Not on file  Occupational History   Occupation: retired  Tobacco Use   Smoking status: Never   Smokeless tobacco: Never  Vaping Use   Vaping status: Never Used  Substance and Sexual Activity   Alcohol  use: No    Alcohol /week: 0.0 standard drinks of alcohol    Drug use: No  Sexual activity: Not Currently  Other Topics Concern   Not on file  Social History Narrative   caring for daughter with leg injury   caring for sister with leukemia          Social Drivers of Health   Financial Resource Strain: Low Risk  (07/29/2023)   Overall Financial Resource Strain (CARDIA)    Difficulty of Paying Living Expenses: Not hard at all  Food Insecurity: No Food Insecurity (07/29/2023)   Hunger Vital Sign    Worried About Running Out of Food in the Last Year: Never true    Ran Out of Food in the Last Year: Never true  Transportation Needs: No Transportation Needs (07/29/2023)   PRAPARE - Administrator, Civil Service (Medical): No    Lack of Transportation (Non-Medical): No  Physical Activity: Inactive (07/29/2023)   Exercise Vital Sign    Days of Exercise per Week: 0 days    Minutes of Exercise per Session: 0 min  Stress: No Stress Concern Present (07/29/2023)   [employer] of Occupational Health - Occupational Stress Questionnaire    Feeling of Stress : Not at all  Social Connections: Socially Isolated (07/29/2023)   Social Connection and Isolation Panel    Frequency of Communication with Friends and Family: More than three times a week    Frequency of Social Gatherings with Friends and Family: Never    Attends Religious Services: Never    Database administrator or Organizations: No    Attends Banker Meetings: Never    Marital Status: Widowed   Intimate Partner Violence: Not At Risk (07/29/2023)   Humiliation, Afraid, Rape, and Kick questionnaire    Fear of Current or Ex-Partner: No    Emotionally Abused: No    Physically Abused: No    Sexually Abused: No    PHYSICAL EXAM  GENERAL EXAM/CONSTITUTIONAL: Vitals:  Vitals:   12/28/23 1343 12/28/23 1348  BP: (!) 173/76 (!) 159/78  Pulse: 72   Resp: 16   SpO2: 93%   Height: 5' 4 (1.626 m)     Body mass index is 19.61 kg/m. Wt Readings from Last 3 Encounters:  09/08/23 114 lb 4 oz (51.8 kg)  08/20/23 114 lb 4 oz (51.8 kg)  07/29/23 111 lb (50.3 kg)   Patient is in no distress; well developed, nourished and groomed; neck is supple  EYES: Visual fields full to confrontation, Extraocular movements intacts,  No results found.  MUSCULOSKELETAL: Gait, strength, tone, movements noted in Neurologic exam below  NEUROLOGIC: MENTAL STATUS:     11/26/2020    8:20 AM 08/12/2017    4:30 PM 08/08/2016    9:28 AM  MMSE - Mini Mental State Exam  Not completed: Unable to complete    Orientation to time  5  5   Orientation to Place  5  5   Registration  3  3   Attention/ Calculation  0  0   Recall  3  3   Language- name 2 objects  0  0   Language- repeat  1 1  Language- follow 3 step command  3  3   Language- read & follow direction  0  0   Write a sentence  0  0   Copy design  0  0   Total score  20  20      Data saved with a previous flowsheet row definition   awake, alert, oriented to person, place and  time, knows the last 2 [place]  president recent and remote memory intact normal attention and concentration, able to count the number of quarters in one and two dollars but unable to count for $1.75 language fluent, comprehension intact, naming intact fund of knowledge appropriate  CRANIAL NERVE:  2nd, 3rd, 4th, 6th - Visual fields full to confrontation, extraocular muscles intact, no nystagmus 5th - facial sensation symmetric 7th - facial strength symmetric 8th -  hearing intact 9th - palate elevates symmetrically, uvula midline 11th - shoulder shrug symmetric 12th - tongue protrusion midline  MOTOR:  Frail, thin woman, at least antigravity in the BUE, BLE  SENSORY:  normal and symmetric to light touch  COORDINATION:  finger-nose-finger, fine finger movements normal  Gait deferred    DIAGNOSTIC DATA (LABS, IMAGING, TESTING) - I reviewed patient records, labs, notes, testing and imaging myself where available.  Lab Results  Component Value Date   WBC 7.4 08/20/2023   HGB 13.7 08/20/2023   HCT 41.3 08/20/2023   MCV 92.8 08/20/2023   PLT 185.0 08/20/2023      Component Value Date/Time   NA 138 08/20/2023 1243   K 4.3 08/20/2023 1243   CL 97 08/20/2023 1243   CO2 35 (H) 08/20/2023 1243   GLUCOSE 121 (H) 08/20/2023 1243   BUN 24 (H) 08/20/2023 1243   CREATININE 0.59 08/20/2023 1243   CALCIUM  9.1 08/20/2023 1243   PROT 6.4 08/20/2023 1243   ALBUMIN 3.9 08/20/2023 1243   AST 19 08/20/2023 1243   ALT 19 08/20/2023 1243   ALKPHOS 67 08/20/2023 1243   BILITOT 0.4 08/20/2023 1243   GFRNONAA >60 06/19/2022 0224   GFRAA 104 10/26/2006 0922   Lab Results  Component Value Date   CHOL 162 06/12/2023   HDL 56.30 06/12/2023   LDLCALC 70 06/12/2023   LDLDIRECT 159.5 02/19/2009   TRIG 181.0 (H) 06/12/2023   Lab Results  Component Value Date   HGBA1C 5.9 06/12/2023   Lab Results  Component Value Date   VITAMINB12 366 08/20/2023   Lab Results  Component Value Date   TSH 1.23 08/20/2023    CT head 06/14/2022 1. No acute intracranial pathology. 2. Advanced background chronic small-vessel ischemic change. 3. 1.6 cm meningioma overlying the high right frontal lobe. 4. Layering fluid in the maxillary sinuses could reflect acute sinusitis in the correct clinical setting.   LTM EEG 06/15/2022 - Electrographic status epilepticus, generalized - Continuous slow, generalized  ASSESSMENT AND PLAN  89 y.o. year old female  with  history of hypertension, hyperlipidemia, osteoporosis, epilepsy  who is presenting for follow-up.  She is doing well on Keppra  250 mg nightly, reports some side effect of sleepiness but otherwise doing well, no seizure or seizure activity.  She was complaining of dizziness, ringing in the ear, diagnosed with ear infection and currently on azithromycin , she has 2 doses left.  Plan will be for patient to continue with levetiracetam  250 mg nightly, continue follow-up PCP and I will see them in 1 year for follow-up or sooner if worse.   1. Seizure disorder (HCC)   2. Meningioma [facility])     Patient Instructions  Continue with Levetiracetam  250 mg nightly  Continue your other medications  Please increase physical activity  Continue to follow up with PCP  Return in a year or sooner if worse    Per [place] statutes, patients with seizures are not allowed to drive until they have been seizure-free for six months.  Other recommendations include  using caution when using heavy equipment or power tools. Avoid working on ladders or at heights. Take showers instead of baths.  Do not swim alone.  Ensure the water temperature is not too high on the home water heater. Do not go swimming alone. Do not lock yourself in a room alone (i.e. bathroom). When caring for infants or small children, sit down when holding, feeding, or changing them to minimize risk of injury to the child in the event you have a seizure. Maintain good sleep hygiene. Avoid alcohol .  Also recommend adequate sleep, hydration, good diet and minimize stress.   During the Seizure  - First, ensure adequate ventilation and place patients on the floor on their left side  Loosen clothing around the neck and ensure the airway is patent. If the patient is clenching the teeth, do not force the mouth open with any object as this can cause severe damage - Remove all items from the surrounding that can be hazardous. The patient may be oblivious to  what's happening and may not even know what he or she is doing. If the patient is confused and wandering, either gently guide him/her away and block access to outside areas - Reassure the individual and be comforting - Call 911. In most cases, the seizure ends before EMS arrives. However, there are cases when seizures may last over 3 to 5 minutes. Or the individual may have developed breathing difficulties or severe injuries. If a pregnant patient or a person with diabetes develops a seizure, it is prudent to call an ambulance. - Finally, if the patient does not regain full consciousness, then call EMS. Most patients will remain confused for about 45 to 90 minutes after a seizure, so you must use judgment in calling for help. - Avoid restraints but make sure the patient is in a bed with padded side rails - Place the individual in a lateral position with the neck slightly flexed; this will help the saliva drain from the mouth and prevent the tongue from falling backward - Remove all nearby furniture and other hazards from the area - Provide verbal assurance as the individual is regaining consciousness - Provide the patient with privacy if possible - Call for help and start treatment as ordered by the caregiver   After the Seizure (Postictal Stage)  After a seizure, most patients experience confusion, fatigue, muscle pain and/or a headache. Thus, one should permit the individual to sleep. For the next few days, reassurance is essential. Being calm and helping reorient the person is also of importance.  Most seizures are painless and end spontaneously. Seizures are not harmful to others but can lead to complications such as stress on the lungs, brain and the heart. Individuals with prior lung problems may develop labored breathing and respiratory distress.     No orders of the defined types were placed in this encounter.   Meds ordered this encounter  Medications   levETIRAcetam  (KEPPRA ) 250 MG  tablet    Sig: Take 1 tablet (250 mg total) by mouth at bedtime.    Dispense:  90 tablet    Refill:  3    Return in about 1 year (around 12/27/2024).    Pastor Falling, MD 12/28/2023, 3:33 PM  [facility] [address] [place], [place] [address] [phone]

## 2023-12-28 NOTE — Patient Instructions (Signed)
 Continue with Levetiracetam  250 mg nightly  Continue your other medications  Please increase physical activity  Continue to follow up with PCP  Return in a year or sooner if worse

## 2024-01-18 ENCOUNTER — Ambulatory Visit: Payer: Medicare HMO | Admitting: Neurology

## 2024-01-18 DIAGNOSIS — H0231 Blepharochalasis right upper eyelid: Secondary | ICD-10-CM | POA: Diagnosis not present

## 2024-02-05 ENCOUNTER — Ambulatory Visit (INDEPENDENT_AMBULATORY_CARE_PROVIDER_SITE_OTHER): Admitting: Family Medicine

## 2024-02-05 ENCOUNTER — Encounter: Payer: Self-pay | Admitting: Family Medicine

## 2024-02-05 ENCOUNTER — Ambulatory Visit: Payer: Self-pay | Admitting: Family Medicine

## 2024-02-05 VITALS — BP 134/78 | HR 78 | Temp 98.0°F | Ht 64.0 in | Wt 119.4 lb

## 2024-02-05 DIAGNOSIS — R630 Anorexia: Secondary | ICD-10-CM | POA: Insufficient documentation

## 2024-02-05 DIAGNOSIS — R350 Frequency of micturition: Secondary | ICD-10-CM | POA: Diagnosis not present

## 2024-02-05 DIAGNOSIS — H9193 Unspecified hearing loss, bilateral: Secondary | ICD-10-CM | POA: Diagnosis not present

## 2024-02-05 DIAGNOSIS — R519 Headache, unspecified: Secondary | ICD-10-CM | POA: Diagnosis not present

## 2024-02-05 DIAGNOSIS — N390 Urinary tract infection, site not specified: Secondary | ICD-10-CM

## 2024-02-05 LAB — POC URINALSYSI DIPSTICK (AUTOMATED)
Bilirubin, UA: NEGATIVE
Glucose, UA: NEGATIVE
Ketones, UA: NEGATIVE
Nitrite, UA: NEGATIVE
Protein, UA: NEGATIVE
Spec Grav, UA: 1.015 (ref 1.010–1.025)
Urobilinogen, UA: 2 U/dL — AB
pH, UA: 6 (ref 5.0–8.0)

## 2024-02-05 NOTE — Patient Instructions (Addendum)
 Please try and eat more regularly to see if this helps the headaches and dizziness Small bite of something frequently  (cracker/ peanut butter / small piece of fruit/ yogurt)   Keep drinking water also   Let's check a urinalysis   We will reach out with results   I will check in with your neurologist regarding headaches in the setting of some anxiety   Stay off the allegra  for now   I put the referral in for audiology/hearing eval  Please let us  know if you don't hear in 1-2 weeks to set that up (mychart message or call or letter)

## 2024-02-05 NOTE — Assessment & Plan Note (Signed)
 Bilateral Family mentions at end of visit-desires eval with audiology   Pt is finally willing to have eval

## 2024-02-05 NOTE — Assessment & Plan Note (Signed)
 Intermittent Better today  Results for orders placed or performed in visit on 02/05/24  POCT Urinalysis Dipstick (Automated)   Collection Time: 02/05/24  1:19 PM  Result Value Ref Range   Color, UA yellow    Clarity, UA clear    Glucose, UA Negative Negative   Bilirubin, UA neg    Ketones, UA neg    Spec Grav, UA 1.015 1.010 - 1.025   Blood, UA trace    pH, UA 6.0 5.0 - 8.0   Protein, UA Negative Negative   Urobilinogen, UA 2.0 (A) 0.2 or 1.0 E.U./dL   Nitrite, UA neg    Leukocytes, UA Trace (A) Negative    Will culture

## 2024-02-05 NOTE — Assessment & Plan Note (Signed)
 Intermittent headaches continue  Takes 1/2 tylenol  prn No other neuro symptoms  Occational has a bad one (2 d ago)  History of meningioma and Ct was stable in march  Takes keppra  for seizure disorder and pt thinks the latest generic adds to feeling headache and also worse balance / dizziness at times   Family notes headaches go away when she won't eat  She blames anxiety for this  Unsure what anti anxiety medicines may help without affecting seizure threshold  Of note-pt also has fear of gaining weight (will not admit to)   May have to return for this

## 2024-02-05 NOTE — Assessment & Plan Note (Signed)
 Pt refuses to eat at times Blames anxiety  Not eating frequently enough may add to her headaches

## 2024-02-05 NOTE — Progress Notes (Signed)
 Subjective:    Patient ID: Paula Jimenez, female    DOB: [birth year], 89 y.o.   MRN: [id number]  HPI  Wt Readings from Last 3 Encounters:  02/05/24 119 lb 6 oz (54.1 kg)  09/08/23 114 lb 4 oz (51.8 kg)  08/20/23 114 lb 4 oz (51.8 kg)   20.49 kg/m  Vitals:   02/05/24 1233  BP: 134/78  Pulse: 78  Temp: 98 F (36.7 C)  SpO2: 96%    Pt presents for c/o  Headache Urinary frequency-- couple days ago/ was better now  Also desires hearing eval /audiology referral -pt is finally open to this, has hearing problem in both ears  No burning to urinate   Dizziness Headaches - some and go  Almost every day -not always severe  Can take 1/2 tylenol  for the mild ones  Pretty bad one 2 days ago all over her head / could not get comfortable to sleep   Ever since her keppra  switched manufacturers   Mentioned to neurology   Still won't eat regularly   Held her allegra  to see if it would help   Lots of migraines   Pmhx is remarkable for history of meningioma (right frontal lobe)  Also history of seizure disorder   Most recent CT head:  march 2025  IMPRESSION: 1. No acute intracranial abnormality, specifically, no acute hemorrhage, territorial infarction, or intracranial mass. 2. Redemonstrated sequelae of chronic ischemic microvascular disease. 3. Unchanged 1.4 x 1.7 cm meningioma overlying the high right frontal convexity.    urinalysis today  Results for orders placed or performed in visit on 02/05/24  POCT Urinalysis Dipstick (Automated)   Collection Time: 02/05/24  1:19 PM  Result Value Ref Range   Color, UA yellow    Clarity, UA clear    Glucose, UA Negative Negative   Bilirubin, UA neg    Ketones, UA neg    Spec Grav, UA 1.015 1.010 - 1.025   Blood, UA trace    pH, UA 6.0 5.0 - 8.0   Protein, UA Negative Negative   Urobilinogen, UA 2.0 (A) 0.2 or 1.0 E.U./dL   Nitrite, UA neg    Leukocytes, UA Trace (A) Negative      Patient Active Problem List    Diagnosis Date Noted   Decreased appetite 02/05/2024   Headache 09/08/2023   Urinary frequency 10/13/2022   History of seizure 06/25/2022   Physical deconditioning 06/17/2022   History of stroke 06/14/2022   Mitral valve prolapse 06/14/2022   Transportation insecurity 05/26/2022   Underweight 05/26/2022   Meningioma (HCC) 04/21/2022   Generalized weakness 04/21/2022   Falls frequently 04/07/2022   Varicose vein of leg 10/18/2020   Onychomycosis of toenail 03/02/2019   Urethral caruncle 12/10/2018   Prediabetes 08/25/2018   Medicare annual wellness visit, subsequent 08/25/2018   Loose stools 06/28/2018   Internal hemorrhoid, bleeding 04/20/2018   Corn of toe 04/20/2018   Ingrown nail of great toe of right foot 11/05/2017   Routine general medical examination at a health care facility 08/07/2016   Frequent UTI 02/16/2015   Epistaxis, recurrent 02/28/2014   History of shingles 02/20/2014   Dizziness 01/06/2014   Hearing loss 09/23/2007   History of melanoma 02/25/2007   HLD (hyperlipidemia) 02/25/2007   Allergic rhinitis 02/25/2007   Asthma, chronic 02/25/2007   DEGENERATIVE DISC DISEASE, LUMBOSACRAL SPINE 02/25/2007   Age related osteoporosis 02/25/2007   Essential hypertension 10/26/2006   Past Medical History:  Diagnosis Date   Allergy  Asthma    years ago, 2-3 times a year pt experiences a problem with breathing.   Bursitis, hip    Family history of adverse reaction to anesthesia    DAUGHTER had a reaction to ansethesia   Hyperlipidemia    Hypertension    HYPERTENSION, BENIGN 10/26/2006   Qualifier: Diagnosis of  By: Hope Chessman     MITRAL VALVE PROLAPSE 02/25/2007   Qualifier: History of  By: Cecilie CMA, NANNIE Ivy     Osteopenia 08/01   Osteoporosis    Skin cancer 2015   nose   Past Surgical History:  Procedure Laterality Date   ABDOMINAL HYSTERECTOMY  1971   BREAST BIOPSY Right 06/99   fibrocystic   CYSTOSCOPY WITH BIOPSY N/A 05/31/2015   Procedure:  CYSTOSCOPY WITH BIOPSY/ fulgeration;  Surgeon: Rosina Riis, MD;  Location: [facility];  Service: Urology;  Laterality: N/A;   DENTAL SURGERY     EYE SURGERY Bilateral 2011   June and August of 2011   ORIF RADIAL FRACTURE Left 11/99   arm fracture, radial no surgery   TONSILLECTOMY     Social History   Tobacco Use   Smoking status: Never   Smokeless tobacco: Never  Vaping Use   Vaping status: Never Used  Substance Use Topics   Alcohol  use: No    Alcohol /week: 0.0 standard drinks of alcohol    Drug use: No   Family History  Problem Relation Age of Onset   Leukemia Sister    Cancer Sister        leukemia   Heart disease Mother    Diabetes Mother    Heart failure Mother    Stroke Father    Kidney disease Neg Hx    Kidney cancer Neg Hx    Bladder Cancer Neg Hx    Allergies  Allergen Reactions   Doxycycline  Hyclate Nausea And Vomiting   Clindamycin /Lincomycin Swelling    Throat swelling   Codeine Other (See Comments)    Unknown reaction    Keflex  [Cephalexin ] Other (See Comments)    Dizziness    Levofloxacin  Other (See Comments)    Unknown reaction   Sulfa  Antibiotics Other (See Comments)    Unknown reaction   Valtrex  [Valacyclovir  Hcl] Other (See Comments)    Unknown reaction    Vioxx [Rofecoxib] Other (See Comments)    Unknown reaction   Adhesive [Tape] Rash   Evista [Raloxifene] Rash   Macrobid  [Nitrofurantoin ] Nausea Only   Penicillins Swelling    Mouth swelling   Ultram  [Tramadol ] Other (See Comments)    Dizziness    Current Outpatient Medications on File Prior to Visit  Medication Sig Dispense Refill   atenolol  (TENORMIN ) 50 MG tablet Take 1 tablet (50 mg total) by mouth daily. 90 tablet 3   AYR SALINE NASAL NA Place 1 spray into the nose daily as needed (allergies).      azithromycin  (ZITHROMAX ) 250 MG tablet Take 250 mg by mouth daily.     estradiol  (ESTRACE ) 0.1 MG/GM vaginal cream PLACE 1 APPLICATORFUL VAGINALLY 3 (THREE) TIMES A WEEK. 42.5 g 1    fluticasone  (FLONASE ) 50 MCG/ACT nasal spray PLACE 2 SPRAYS INTO BOTH NOSTRILS DAILY AS NEEDED FOR ALLERGIES OR RHINITIS. 48 mL 0   levETIRAcetam  (KEPPRA ) 250 MG tablet Take 1 tablet (250 mg total) by mouth at bedtime. 90 tablet 3   Probiotic Product (ALIGN PO) Take 1 tablet by mouth daily.     simvastatin  (ZOCOR ) 20 MG tablet Take 1 tablet (20  mg total) by mouth daily. 90 tablet 3   triamcinolone lotion (KENALOG) 0.1 % Apply 1 Application topically at bedtime.     No current facility-administered medications on file prior to visit.    Review of Systems  Constitutional:  Positive for appetite change. Negative for fatigue and fever.  HENT:  Positive for hearing loss.   Genitourinary:  Positive for frequency. Negative for decreased urine volume, difficulty urinating, flank pain, pelvic pain and urgency.  Neurological:  Positive for dizziness and headaches. Negative for tremors, seizures, syncope, facial asymmetry, speech difficulty, weakness, light-headedness and numbness.  Psychiatric/Behavioral:  Negative for agitation, dysphoric mood, hallucinations, sleep disturbance and suicidal ideas. The patient is nervous/anxious.        Objective:   Physical Exam Constitutional:      General: She is not in acute distress.    Appearance: She is well-developed and normal weight. She is not ill-appearing or diaphoretic.     Comments: Frail appearing   HENT:     Head: Normocephalic and atraumatic.     Ears:     Comments: Trouble hearing     Mouth/Throat:     Mouth: Mucous membranes are moist.     Pharynx: Oropharynx is clear.  Eyes:     Conjunctiva/sclera: Conjunctivae normal.     Pupils: Pupils are equal, round, and reactive to light.  Neck:     Thyroid : No thyromegaly.     Vascular: No carotid bruit or JVD.  Cardiovascular:     Rate and Rhythm: Normal rate and regular rhythm.     Heart sounds: Normal heart sounds.     No gallop.  Pulmonary:     Effort: Pulmonary effort is normal. No  respiratory distress.     Breath sounds: Normal breath sounds. No wheezing or rales.  Abdominal:     General: There is no distension or abdominal bruit.     Palpations: Abdomen is soft.     Tenderness: There is no guarding or rebound.     Comments: No suprapubic tenderness or fullness  No cva tenderness   Musculoskeletal:     Cervical back: Normal range of motion and neck supple.     Right lower leg: No edema.     Left lower leg: No edema.  Lymphadenopathy:     Cervical: No cervical adenopathy.  Skin:    General: Skin is warm and dry.     Coloration: Skin is not pale.     Findings: No rash.  Neurological:     Mental Status: She is alert.     Cranial Nerves: No cranial nerve deficit, dysarthria or facial asymmetry.     Sensory: No sensory deficit.     Motor: No weakness, tremor, atrophy, abnormal muscle tone, seizure activity or pronator drift.     Coordination: Romberg sign negative. Coordination normal.     Gait: Gait normal.     Deep Tendon Reflexes: Reflexes are normal and symmetric. Reflexes normal.     Comments: Gait is steady with cane   Psychiatric:        Mood and Affect: Mood is anxious.           Assessment & Plan:   Problem List Items Addressed This Visit       Genitourinary   Frequent UTI   Urinalysis today trace leuk  Will culture         Other   Urinary frequency   Intermittent Better today  Results for orders placed or performed  in visit on 02/05/24  POCT Urinalysis Dipstick (Automated)   Collection Time: 02/05/24  1:19 PM  Result Value Ref Range   Color, UA yellow    Clarity, UA clear    Glucose, UA Negative Negative   Bilirubin, UA neg    Ketones, UA neg    Spec Grav, UA 1.015 1.010 - 1.025   Blood, UA trace    pH, UA 6.0 5.0 - 8.0   Protein, UA Negative Negative   Urobilinogen, UA 2.0 (A) 0.2 or 1.0 E.U./dL   Nitrite, UA neg    Leukocytes, UA Trace (A) Negative    Will culture       Relevant Orders   POCT Urinalysis Dipstick  (Automated) (Completed)   Headache - Primary   Intermittent headaches continue  Takes 1/2 tylenol  prn No other neuro symptoms  Occational has a bad one (2 d ago)  History of meningioma and Ct was stable in march  Takes keppra  for seizure disorder and pt thinks the latest generic adds to feeling headache and also worse balance / dizziness at times   Family notes headaches go away when she won't eat  She blames anxiety for this  Unsure what anti anxiety medicines may help without affecting seizure threshold  Of note-pt also has fear of gaining weight (will not admit to)   May have to return for this       Decreased appetite   Pt refuses to eat at times Blames anxiety  Not eating frequently enough may add to her headaches

## 2024-02-05 NOTE — Assessment & Plan Note (Signed)
 Urinalysis today trace leuk  Will culture

## 2024-02-06 LAB — URINE CULTURE
MICRO NUMBER:: 16838213
Result:: NO GROWTH
SPECIMEN QUALITY:: ADEQUATE

## 2024-02-19 ENCOUNTER — Ambulatory Visit
Admission: EM | Admit: 2024-02-19 | Discharge: 2024-02-19 | Disposition: A | Discharge status: Home / Self Care | Attending: Family Medicine | Admitting: Family Medicine

## 2024-02-19 ENCOUNTER — Encounter: Payer: Self-pay | Admitting: Emergency Medicine

## 2024-02-19 DIAGNOSIS — H9202 Otalgia, left ear: Secondary | ICD-10-CM | POA: Diagnosis not present

## 2024-02-19 MED ORDER — PREDNISONE 10 MG PO TABS: ORAL_TABLET | ORAL | 0 refills | Status: AC

## 2024-02-19 MED ORDER — AZITHROMYCIN 250 MG PO TABS: ORAL_TABLET | ORAL | 0 refills | Status: AC

## 2024-02-19 NOTE — ED Triage Notes (Signed)
 Patient c/o left ear pain that started in the middle of the night.  Patient states that she is beginning to loose some of her hearing as well.  Pain is sharp.  Patient has taken Tylenol  this am.

## 2024-02-19 NOTE — Discharge Instructions (Signed)
 May continue allergy medication and Flonase  nasal spray.

## 2024-02-19 NOTE — ED Provider Notes (Signed)
 [facility]    CSN: [id number] Arrival date & time: 02/19/24  1141      History   Chief Complaint Chief Complaint  Patient presents with   Otalgia    HPI Paula Jimenez is a 89 y.o. female.   Patient complains of sharp left ear pain that started during the night.  She believes that she has also had decreased hearing in her left ear.  She denies nasal congestion, URI symptoms, and fevers, chills, and sweats.  She feels well otherwise.  The history is provided by the patient and a relative.    Past Medical History:  Diagnosis Date   Allergy    Asthma    years ago, 2-3 times a year pt experiences a problem with breathing.   Bursitis, hip    Family history of adverse reaction to anesthesia    DAUGHTER had a reaction to ansethesia   Hyperlipidemia    Hypertension    HYPERTENSION, BENIGN 10/26/2006   Qualifier: Diagnosis of  By: Hope Chessman     MITRAL VALVE PROLAPSE 02/25/2007   Qualifier: History of  By: Cecilie CMA, NANNIE Ivy     Osteopenia 08/01   Osteoporosis    Skin cancer 2015   nose    Patient Active Problem List   Diagnosis Date Noted   Decreased appetite 02/05/2024   Headache 09/08/2023   Urinary frequency 10/13/2022   History of seizure 06/25/2022   Physical deconditioning 06/17/2022   History of stroke 06/14/2022   Mitral valve prolapse 06/14/2022   Transportation insecurity 05/26/2022   Underweight 05/26/2022   Meningioma (HCC) 04/21/2022   Generalized weakness 04/21/2022   Falls frequently 04/07/2022   Varicose vein of leg 10/18/2020   Onychomycosis of toenail 03/02/2019   Urethral caruncle 12/10/2018   Prediabetes 08/25/2018   Medicare annual wellness visit, subsequent 08/25/2018   Loose stools 06/28/2018   Internal hemorrhoid, bleeding 04/20/2018   Corn of toe 04/20/2018   Ingrown nail of great toe of right foot 11/05/2017   Routine general medical examination at a health care facility 08/07/2016   Frequent UTI 02/16/2015    Epistaxis, recurrent 02/28/2014   History of shingles 02/20/2014   Dizziness 01/06/2014   Hearing loss 09/23/2007   History of melanoma 02/25/2007   HLD (hyperlipidemia) 02/25/2007   Allergic rhinitis 02/25/2007   Asthma, chronic 02/25/2007   DEGENERATIVE DISC DISEASE, LUMBOSACRAL SPINE 02/25/2007   Age related osteoporosis 02/25/2007   Essential hypertension 10/26/2006    Past Surgical History:  Procedure Laterality Date   ABDOMINAL HYSTERECTOMY  1971   BREAST BIOPSY Right 06/99   fibrocystic   CYSTOSCOPY WITH BIOPSY N/A 05/31/2015   Procedure: CYSTOSCOPY WITH BIOPSY/ fulgeration;  Surgeon: Rosina Riis, MD;  Location: [facility];  Service: Urology;  Laterality: N/A;   DENTAL SURGERY     EYE SURGERY Bilateral 2011   June and August of 2011   ORIF RADIAL FRACTURE Left 11/99   arm fracture, radial no surgery   TONSILLECTOMY      OB History   No obstetric history on file.      Home Medications    Prior to Admission medications   Medication Sig Start Date End Date Taking? Authorizing Provider  atenolol  (TENORMIN ) 50 MG tablet Take 1 tablet (50 mg total) by mouth daily. 09/08/23  Yes Tower, Laine LABOR, MD  AYR SALINE NASAL NA Place 1 spray into the nose daily as needed (allergies).    Yes [provider]  azithromycin  (ZITHROMAX   Z-PAK) 250 MG tablet Take 2 tabs today; then begin one tab once daily for 4 more days. 02/19/24  Yes Pauline Garnette LABOR, MD  estradiol  (ESTRACE ) 0.1 MG/GM vaginal cream PLACE 1 APPLICATORFUL VAGINALLY 3 (THREE) TIMES A WEEK. 08/24/23  Yes Tower, Marne A, MD  fluticasone  (FLONASE ) 50 MCG/ACT nasal spray PLACE 2 SPRAYS INTO BOTH NOSTRILS DAILY AS NEEDED FOR ALLERGIES OR RHINITIS. 07/14/23  Yes Tower, Laine LABOR, MD  levETIRAcetam  (KEPPRA ) 250 MG tablet Take 1 tablet (250 mg total) by mouth at bedtime. 12/28/23  Yes Gregg Lek, MD  predniSONE  (DELTASONE ) 10 MG tablet Take one tab PO BID for 3 days, then one tab daily for 2 days. 02/19/24  Yes Pauline Garnette LABOR, MD  Probiotic Product (ALIGN PO) Take 1 tablet by mouth daily.   Yes [provider]  simvastatin  (ZOCOR ) 20 MG tablet Take 1 tablet (20 mg total) by mouth daily. 09/08/23  Yes Tower, Laine LABOR, MD  triamcinolone lotion (KENALOG) 0.1 % Apply 1 Application topically at bedtime. 08/14/23  Yes [provider]    Family History Family History  Problem Relation Age of Onset   Leukemia Sister    Cancer Sister        leukemia   Heart disease Mother    Diabetes Mother    Heart failure Mother    Stroke Father    Kidney disease Neg Hx    Kidney cancer Neg Hx    Bladder Cancer Neg Hx     Social History Social History   Tobacco Use   Smoking status: Never   Smokeless tobacco: Never  Vaping Use   Vaping status: Never Used  Substance Use Topics   Alcohol  use: No    Alcohol /week: 0.0 standard drinks of alcohol    Drug use: No     Allergies   Doxycycline  hyclate, Clindamycin /lincomycin, Codeine, E-mycin [erythromycin], Keflex  [cephalexin ], Levofloxacin , Sulfa  antibiotics, Valtrex  [valacyclovir  hcl], Vioxx [rofecoxib], Adhesive [tape], Evista [raloxifene], Macrobid  [nitrofurantoin ], Penicillins, and Ultram  [tramadol ]   Review of Systems Review of Systems  Constitutional:  Negative for activity change, appetite change, chills, diaphoresis, fatigue and fever.  HENT:  Positive for ear pain and hearing loss. Negative for ear discharge, facial swelling, rhinorrhea, sinus pain, sore throat and tinnitus.   All other systems reviewed and are negative.    Physical Exam Triage Vital Signs ED Triage Vitals  Encounter Vitals Group     BP 02/19/24 1200 (!) 165/76     Girls Systolic BP Percentile --      Girls Diastolic BP Percentile --      Boys Systolic BP Percentile --      Boys Diastolic BP Percentile --      Pulse Rate 02/19/24 1200 75     Resp 02/19/24 1200 18     Temp --      Temp src --      SpO2 02/19/24 1200 95 %     Weight --      Height --      Head  Circumference --      Peak Flow --      Pain Score 02/19/24 1203 3     Pain Loc --      Pain Education --      Exclude from Growth Chart --    No data found.  Updated Vital Signs BP (!) 165/76 (BP Location: Right Arm)   Pulse 75   Resp 18   SpO2 95%   Visual Acuity Right  Eye Distance:   Left Eye Distance:   Bilateral Distance:    Right Eye Near:   Left Eye Near:    Bilateral Near:     Physical Exam Vitals and nursing note reviewed.  Constitutional:      General: She is not in acute distress.    Appearance: She is not ill-appearing.  HENT:     Head: Normocephalic.     Right Ear: Tympanic membrane and ear canal normal.     Left Ear: Tympanic membrane and ear canal normal.     Nose: Nose normal.     Mouth/Throat:     Mouth: Mucous membranes are moist.     Pharynx: Oropharynx is clear.  Eyes:     Conjunctiva/sclera: Conjunctivae normal.     Pupils: Pupils are equal, round, and reactive to light.  Cardiovascular:     Rate and Rhythm: Normal rate.  Pulmonary:     Effort: Pulmonary effort is normal.  Musculoskeletal:     Cervical back: Neck supple.  Lymphadenopathy:     Cervical: No cervical adenopathy.  Skin:    General: Skin is warm and dry.  Neurological:     Mental Status: She is alert and oriented to person, place, and time.      UC Treatments / Results  Labs (all labs ordered are listed, but only abnormal results are displayed) Labs Reviewed - No data to display  EKG   Radiology No results found.  Procedures Procedures   Tympanometry:  Right ear tympanogram normal; Left ear tympanogram positive peak pressure  Medications Ordered in UC Medications - No data to display  Initial Impression / Assessment and Plan / UC Course  I have reviewed the triage vital signs and the nursing notes.  Pertinent labs & imaging results that were available during my care of the patient were reviewed by me and considered in my medical decision making (see chart  for details).    Although left tympanic membrane appears normal, tympanogram shows positive peak pressure left ear. Will begin Z-pak, and prednisone  10mg  taper. Followup with ENT if not improved one week.  Final Clinical Impressions(s) / UC Diagnoses   Final diagnoses:  Otalgia of left ear     Discharge Instructions      May continue allergy medication and Flonase  nasal spray.     ED Prescriptions     Medication Sig Dispense Auth. Provider   azithromycin  (ZITHROMAX  Z-PAK) 250 MG tablet Take 2 tabs today; then begin one tab once daily for 4 more days. 6 tablet Pauline Garnette LABOR, MD   predniSONE  (DELTASONE ) 10 MG tablet Take one tab PO BID for 3 days, then one tab daily for 2 days. 8 tablet Pauline Garnette LABOR, MD         Pauline Garnette LABOR, MD 02/21/24 [phone]

## 2024-02-20 ENCOUNTER — Telehealth: Payer: Self-pay

## 2024-02-20 NOTE — Telephone Encounter (Signed)
 Called to check on patient, no answer. Voicemail box full.

## 2024-02-25 ENCOUNTER — Telehealth: Payer: Self-pay | Admitting: Family Medicine

## 2024-02-25 NOTE — Telephone Encounter (Unsigned)
 Copied from CRM [id number]. Topic: Referral - Question >> Feb 25, 2024  1:55 PM Martinique E wrote: Reason for CRM: Patient's daughter, Nichole, called in stating that she has not heard from [facility] in regards to the audiology referral. Nichole would like this referral sent to the [place] location instead of [place]. Callback number for daughter is [phone].

## 2024-02-26 NOTE — Telephone Encounter (Signed)
 Per the referral notes they have tried to reach her and left a message on 02/08/24  She can call them back to schedule.     Is she solely wanting to switch just bc she has not heard from them or because she prefers [place] location over [place]? Because they have tried to reach her and all they have to do is call them back.   [facility] [address] [place],  [place]  [address] Main: [phone]

## 2024-02-29 NOTE — Telephone Encounter (Signed)
 Pt's daughter Paula Jimenez said she did get that VM and called them back the exact same day on 02/08/24 and no one answered so she left a VM requesting their office to call her back. She never got a call so she called them again and left another VM and no one ever called her back to schedule her mother's appt.   Given that the [place] office never returned her call she doesn't want to proceed with them and would like to proceed with changing the referral to [place].

## 2024-03-01 ENCOUNTER — Encounter: Payer: Self-pay | Admitting: *Deleted

## 2024-03-01 NOTE — Telephone Encounter (Signed)
 Okay.   I will fax the referral to [facility] and once they review the referral and they will contact her to directly to schedule. This is going to be a slightly lengthier process than seeing someone within [facility].   I have faxed everything to their office. The patient is more than welcome to call and schedule her appt but be reminded they have to receive the referral and review it before scheduling can take place, this can take anywhere from 7-14 business days.   See referral for updates.

## 2024-03-24 DIAGNOSIS — H903 Sensorineural hearing loss, bilateral: Secondary | ICD-10-CM | POA: Diagnosis not present

## 2024-07-29 ENCOUNTER — Ambulatory Visit: Payer: Medicare HMO

## 2024-10-20 ENCOUNTER — Ambulatory Visit

## 2025-01-03 ENCOUNTER — Ambulatory Visit: Admitting: Adult Health
